# Patient Record
Sex: Female | Born: 1940 | Race: White | Hispanic: No | Smoking: Never smoker
Health system: Southern US, Community
[De-identification: ages and names within clinical notes are randomized; demographics above are authoritative.]

## PROBLEM LIST (undated history)

## (undated) DIAGNOSIS — M797 Fibromyalgia: Secondary | ICD-10-CM

## (undated) DIAGNOSIS — K219 Gastro-esophageal reflux disease without esophagitis: Secondary | ICD-10-CM

## (undated) DIAGNOSIS — F32A Depression, unspecified: Secondary | ICD-10-CM

## (undated) DIAGNOSIS — I10 Essential (primary) hypertension: Secondary | ICD-10-CM

## (undated) DIAGNOSIS — E7800 Pure hypercholesterolemia, unspecified: Secondary | ICD-10-CM

## (undated) DIAGNOSIS — Z9889 Other specified postprocedural states: Secondary | ICD-10-CM

## (undated) DIAGNOSIS — K449 Diaphragmatic hernia without obstruction or gangrene: Secondary | ICD-10-CM

## (undated) DIAGNOSIS — F329 Major depressive disorder, single episode, unspecified: Secondary | ICD-10-CM

## (undated) DIAGNOSIS — K52832 Lymphocytic colitis: Secondary | ICD-10-CM

## (undated) DIAGNOSIS — D649 Anemia, unspecified: Secondary | ICD-10-CM

## (undated) DIAGNOSIS — K52831 Collagenous colitis: Secondary | ICD-10-CM

## (undated) DIAGNOSIS — K589 Irritable bowel syndrome without diarrhea: Secondary | ICD-10-CM

## (undated) HISTORY — DX: Diaphragmatic hernia without obstruction or gangrene: K44.9

## (undated) HISTORY — DX: Essential (primary) hypertension: I10

## (undated) HISTORY — PX: KNEE SURGERY: SHX244

## (undated) HISTORY — DX: Fibromyalgia: M79.7

## (undated) HISTORY — PX: NOSE SURGERY: SHX723

## (undated) HISTORY — DX: Major depressive disorder, single episode, unspecified: F32.9

## (undated) HISTORY — DX: Collagenous colitis: K52.831

## (undated) HISTORY — DX: Other specified postprocedural states: Z98.890

## (undated) HISTORY — DX: Pure hypercholesterolemia, unspecified: E78.00

## (undated) HISTORY — PX: OTHER SURGICAL HISTORY: SHX169

## (undated) HISTORY — DX: Gastro-esophageal reflux disease without esophagitis: K21.9

## (undated) HISTORY — DX: Irritable bowel syndrome, unspecified: K58.9

## (undated) HISTORY — PX: FOOT SURGERY: SHX648

## (undated) HISTORY — DX: Lymphocytic colitis: K52.832

## (undated) HISTORY — PX: ABDOMINAL HYSTERECTOMY: SHX81

## (undated) HISTORY — DX: Anemia, unspecified: D64.9

## (undated) HISTORY — DX: Depression, unspecified: F32.A

---

## 2005-10-17 ENCOUNTER — Encounter: Payer: Self-pay | Admitting: Family Medicine

## 2005-11-04 ENCOUNTER — Encounter: Payer: Self-pay | Admitting: Family Medicine

## 2005-12-04 ENCOUNTER — Encounter: Payer: Self-pay | Admitting: Family Medicine

## 2006-05-27 ENCOUNTER — Inpatient Hospital Stay (HOSPITAL_COMMUNITY): Admission: EM | Admit: 2006-05-27 | Discharge: 2006-06-04 | Payer: Self-pay | Admitting: Emergency Medicine

## 2006-05-27 ENCOUNTER — Ambulatory Visit: Payer: Self-pay | Admitting: Internal Medicine

## 2006-05-29 ENCOUNTER — Encounter (INDEPENDENT_AMBULATORY_CARE_PROVIDER_SITE_OTHER): Payer: Self-pay | Admitting: Specialist

## 2006-05-29 HISTORY — PX: FLEXIBLE SIGMOIDOSCOPY: SHX1649

## 2006-06-26 ENCOUNTER — Ambulatory Visit: Payer: Self-pay | Admitting: Internal Medicine

## 2006-08-06 DIAGNOSIS — K449 Diaphragmatic hernia without obstruction or gangrene: Secondary | ICD-10-CM

## 2006-08-06 HISTORY — DX: Diaphragmatic hernia without obstruction or gangrene: K44.9

## 2006-12-04 ENCOUNTER — Ambulatory Visit: Payer: Self-pay | Admitting: Internal Medicine

## 2007-03-07 ENCOUNTER — Ambulatory Visit: Payer: Self-pay | Admitting: Internal Medicine

## 2007-04-07 DIAGNOSIS — Z9889 Other specified postprocedural states: Secondary | ICD-10-CM

## 2007-04-07 HISTORY — DX: Other specified postprocedural states: Z98.890

## 2007-04-09 ENCOUNTER — Ambulatory Visit: Payer: Self-pay | Admitting: Internal Medicine

## 2007-04-22 ENCOUNTER — Ambulatory Visit: Payer: Self-pay | Admitting: Internal Medicine

## 2007-04-22 ENCOUNTER — Ambulatory Visit (HOSPITAL_COMMUNITY): Admission: RE | Admit: 2007-04-22 | Discharge: 2007-04-22 | Payer: Self-pay | Admitting: Internal Medicine

## 2007-04-22 HISTORY — PX: ESOPHAGOGASTRODUODENOSCOPY: SHX1529

## 2008-03-04 ENCOUNTER — Ambulatory Visit: Payer: Self-pay | Admitting: Internal Medicine

## 2008-03-29 ENCOUNTER — Ambulatory Visit: Payer: Self-pay | Admitting: Gastroenterology

## 2008-04-13 ENCOUNTER — Encounter: Payer: Self-pay | Admitting: Internal Medicine

## 2008-04-13 ENCOUNTER — Ambulatory Visit: Payer: Self-pay | Admitting: Internal Medicine

## 2008-04-13 ENCOUNTER — Ambulatory Visit (HOSPITAL_COMMUNITY): Admission: RE | Admit: 2008-04-13 | Discharge: 2008-04-13 | Payer: Self-pay | Admitting: Internal Medicine

## 2008-04-13 HISTORY — PX: COLONOSCOPY: SHX174

## 2008-05-20 ENCOUNTER — Ambulatory Visit: Payer: Self-pay | Admitting: Internal Medicine

## 2008-08-05 DIAGNOSIS — R159 Full incontinence of feces: Secondary | ICD-10-CM | POA: Insufficient documentation

## 2008-08-05 DIAGNOSIS — Z9189 Other specified personal risk factors, not elsewhere classified: Secondary | ICD-10-CM | POA: Insufficient documentation

## 2008-08-05 DIAGNOSIS — F32A Depression, unspecified: Secondary | ICD-10-CM | POA: Insufficient documentation

## 2008-08-05 DIAGNOSIS — D649 Anemia, unspecified: Secondary | ICD-10-CM | POA: Insufficient documentation

## 2008-08-05 DIAGNOSIS — M359 Systemic involvement of connective tissue, unspecified: Secondary | ICD-10-CM | POA: Insufficient documentation

## 2008-08-05 DIAGNOSIS — R109 Unspecified abdominal pain: Secondary | ICD-10-CM | POA: Insufficient documentation

## 2008-08-05 DIAGNOSIS — K589 Irritable bowel syndrome without diarrhea: Secondary | ICD-10-CM | POA: Insufficient documentation

## 2008-08-05 DIAGNOSIS — R197 Diarrhea, unspecified: Secondary | ICD-10-CM | POA: Insufficient documentation

## 2008-08-05 DIAGNOSIS — R143 Flatulence: Secondary | ICD-10-CM

## 2008-08-05 DIAGNOSIS — IMO0001 Reserved for inherently not codable concepts without codable children: Secondary | ICD-10-CM | POA: Insufficient documentation

## 2008-08-05 DIAGNOSIS — K219 Gastro-esophageal reflux disease without esophagitis: Secondary | ICD-10-CM | POA: Insufficient documentation

## 2008-08-05 DIAGNOSIS — R110 Nausea: Secondary | ICD-10-CM | POA: Insufficient documentation

## 2008-08-05 DIAGNOSIS — Z8719 Personal history of other diseases of the digestive system: Secondary | ICD-10-CM | POA: Insufficient documentation

## 2008-08-05 DIAGNOSIS — I10 Essential (primary) hypertension: Secondary | ICD-10-CM | POA: Insufficient documentation

## 2008-08-05 DIAGNOSIS — R142 Eructation: Secondary | ICD-10-CM

## 2008-08-05 DIAGNOSIS — M129 Arthropathy, unspecified: Secondary | ICD-10-CM | POA: Insufficient documentation

## 2008-08-05 DIAGNOSIS — R141 Gas pain: Secondary | ICD-10-CM | POA: Insufficient documentation

## 2008-08-05 DIAGNOSIS — R1310 Dysphagia, unspecified: Secondary | ICD-10-CM | POA: Insufficient documentation

## 2008-08-05 DIAGNOSIS — F329 Major depressive disorder, single episode, unspecified: Secondary | ICD-10-CM | POA: Insufficient documentation

## 2008-08-05 DIAGNOSIS — F418 Other specified anxiety disorders: Secondary | ICD-10-CM | POA: Insufficient documentation

## 2010-12-19 NOTE — Assessment & Plan Note (Signed)
NAMEMarland Kitchen  Marie, [name]                  CHART#:  [id number]   DATE:  03/04/2008                       DOB:  01-Nov-1940   CHIEF COMPLAINT:  Diarrhea for 3 weeks.   SUBJECTIVE:  The patient is a 70 year old lady who was brought in for  urgent office visit.  We received a phone call from the patient's  neighbor, Marie [name], stating that the patient had been sick with diarrhea for  almost 3 weeks, and that she was getting weak, having upwards to 11  bowel movements daily.  She said Imodium was not helping nor was  Questran.  We advised that the patient need to be seen today.  The  patient states that she was doing very well, having about 2 bowel  movements a day of solid stool.  However, 3 weeks ago, after eating out  at a restaurant she developed acute onset of diarrhea.  She states she  has more than 10 bowel movements a day consistently.  She often is  awoken from her sleep to have a bowel movement.  She has had multiple  episodes of incontinence.  Her stool is described as watery and  nonbloody.  She had 11 stools yesterday.  She has lost 6 pounds.  She  has nausea, but no vomiting.  She states she has had temperature of 99,  but that is the highest.  Her last use of antibiotics was in May 2009,  for UTI.  She states that she is anemic and has had some blood work  recently with her family physician at [facility].  She  complains of being fatigued and tired.  She feels weak.  She is able to  eat and drink plenty of fluids.  She denies any medication changes prior  to beginning of this diarrhea.   CURRENT MEDICATIONS:  1. Multivitamin daily.  2. Calcium daily.  3. Nexium 40 mg daily.  4. HCTZ 12.5 mg daily.  5. Cozaar 50 mg daily.  6. Tramadol 50 mg p.r.n.  7. Oxybutynin 10 mg daily.  8. Cymbalta 30 mg daily.  9. Questran powder as needed.  10.Metamucil at bedtime.  11.Vitamin D daily.  12.Vitamin B complex daily.   ALLERGIES:  No known drug allergies.   PHYSICAL  EXAMINATION:  VITAL SIGNS:  Weight 163 pounds, which is down  from 176 pounds back in September 2008.  Temperature 98.2, blood  pressure 120/78, and pulse 72.  GENERAL:  A pleasant, well-nourished, well-developed, Caucasian female  in no acute distress.  SKIN:  Warm and dry.  No jaundice.  HEENT:  Sclerae nonicteric.  Oropharyngeal mucosa moist and pink.  CHEST:  Lungs are clear to auscultation.  CARDIAC:  Regular rate and rhythm. No murmurs, rubs, or gallops.  ABDOMEN:  Positive bowel sounds.  Abdomen is soft and nondistended.  She  has mild left lower quadrant and lower abdominal tenderness to deep  palpation.  No rebound or guarding.  No organomegaly or masses.  No  abdominal bruits or hernias.  LOWER EXTREMITIES:  No edema.   IMPRESSION:  The patient is a very pleasant 70 year old lady with 3-week  history of diarrhea.  She has a history of lymphocytic colitis  previously responded nicely to Entocort.  She had been in remission at  the time of her last  office visit in September 2008.  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  We would like to rule  out infectious etiology, given the onset of these symptoms.  Less likely  we are dealing with diverticulitis, given that her pronounced symptom is  diarrhea rather than a left lower quadrant pain.  She tells me that she  has anemia, and she was being checked for iron deficiency.  We will  retrieve these labs to see what to what degree she is anemic.   PLAN:  1. Retrieve labs from [facility].  2. Send stool for O and P culture, C. diff, and WBC.  3. We will begin Entocort 9 mg daily, #90, 0 refills.  4. Lomotil 1 p.o. b.i.d., #30, 0 refills.  5. If she develops fever of 101 or greater, she will let someone know      or go to the emergency department.  She will go to the emergency      department if she has worsening abdominal pain or if she is not      able to consume plenty of POs.  Further  recommendations to follow.       Tana Coast, P.A.  Electronically Signed     R. Roetta Sessions, M.D.  Electronically Signed    LL/MEDQ  D:  03/04/2008  T:  03/05/2008  Job:  [id number]   cc:   [facility]

## 2010-12-19 NOTE — Assessment & Plan Note (Signed)
[name], [name]                    CHART#:  [id number]   DATE:  03/07/2007                       DOB:  06/16/1941   CHIEF COMPLAINT:  Diarrhea/history of microscopic colitis.   SUBJECTIVE:  The patient is a 70 -year-old Caucasian female who has a  history of microscopic colitis. She had previously been on Entocort and  was tapered off of this approximately two months ago. She tells that she  became ill five days ago. She was having 7-8 loose stools a day and this  lasted for about three days. She tells me her mother and sister were  also sick. However, her symptoms have continued to persist. She has a  history of fibromyalgia. She is complaining of some weakness. She  complains of nausea, bloating and abdominal cramping. The diarrhea has  begun to slack off. She tells me she had two bowel movements today.  She denies any fever or chills. She does continue to take Metamucil  twice daily. She has a history of chronic GERD. She is on Nexium and has  been doing well with this. Denies any breakthrough symptoms. She has  noticed intermittent choking with her food, mostly solid foods over the  last three weeks as well. She tells me she had an EGD previously in  [place] which showed a hiatal hernia within the last couple of years,  but we do not have this report.   CURRENT MEDICATIONS:  See the list from March 07, 2007.   ALLERGIES:  No known drug allergies.   OBJECTIVE:  VITAL SIGNS: Weight 174 pounds, height 62.5 inches,  temperature 97.7, blood pressure 138/80, pulse 86.  GENERAL: The patient is well-developed, well-nourished elderly female in  no acute distress.  HEENT: Sclerae are  clear and anicteric. Conjunctivae pink. Oropharynx  pink and moist without any lesions.  CHEST: HEART: Regular rate and rhythm. Normal S1, S2.  ABDOMEN: Positive bowel sounds x4. No bruits auscultated. Soft and  nontender, nondistended without palpable mass or hepatosplenomegaly. No  rebound, tenderness  or guarding.  EXTREMITIES: Without clubbing or edema bilaterally.   ASSESSMENT:  The patient is a 70 year old female with acute onset of  diarrhea with a past history of microscopic colitis which has been doing  well. With both her mother and sister sick, it is possible that she may  have had a viral enteritis. It is reassuring that she is improving each  day. She can try Imodium which is first line therapy if she is having  recurrent problems with her microscopic colitis as well. However, at  this point, she says the diarrhea has improved greatly and we will just  continue to monitor.   As far as her dysphagia is concerned, I have offered barium pill  esophagram to further assess her symptoms. However, she is wanting to  hold off at this point and see how she does.   She has a history of anemia. Last hemoglobin was 11.1 on December 04, 2006.   PLAN:  1. Offered barium pill esophagram and she declines. She will call if      she changes her mind.  2. CBC, MET-7 today.  3. Imodium 2 mg in the morning if she has persistent diarrhea.  4. Continue Nexium 40 mg daily.       Lorenza Burton,  N.P.  Electronically Signed     R. Roetta Sessions, M.D.  Electronically Signed    KJ/MEDQ  D:  03/07/2007  T:  03/07/2007  Job:  [id number]   cc:   [facility]

## 2010-12-19 NOTE — Assessment & Plan Note (Signed)
NAMEMANIKA, HAST                    CHART#:  [id number]   DATE:  04/09/2007                       DOB:  02-15-1941   HISTORY OF PRESENT ILLNESS:  Follow up microscopic colitis,  gastroesophageal reflux disease now with esophageal dysphagia.  The  patient was evaluated end of last year with hospitalized to [facility].  She had been having diarrhea and had been up to [facility]  being evaluated by Dr. Edmonia Caprio.  Ultimately she underwent a sigmoidoscopy  by Dr. Dionicia Abler on 05/29/06 and was found to have colitis on sigmoid  biopsy.  She was started on Anticort and this was associated with a  marked and dramatic improvement in her diarrhea symptoms.  She has  ultimately come off the Anticort and has been on low dose Imodium and  was tried on some Questran recently which she really did not like taking  but it did curb diarrhea further.  This was a month ago for a few days.  Currently she is having 1-2 bowel movements daily.  Occasionally has  diarrhea but her good days are far outnumbering her bad days taking 1-2  Imodium daily.  Her big concern now is reflux symptoms and decreasing  benefit of her Nexium which she tells me now has a $50.00 copay.  She  also describes intermittent esophageal dysphagia to solids.  She had an  ENT evaluation a few years ago that told her cough was due to reflux but  she describes not having an EGD recently, she did have one many years  ago and was told she had a hiatal hernia but she describes intermittent  esophageal dysphagia now to solids including meat and bread.   CURRENT MEDICATIONS:  See updated list.   ALLERGIES:  No known drug allergies.   PHYSICAL EXAMINATION:  GENERAL:  Looks well today.  VITAL SIGNS:  Weight 176 which is up 2 pounds since 03/07/07, height 5  foot 2.5 inches, temperature 98.4, BP 118/82, pulse 64.  SKIN:  Warm and dry.  CHEST:  Lungs are clear to auscultation.  CARDIAC:  Regular rate and rhythm without murmurs, gallops or  rubs.  ABDOMEN:  Nondistended, positive bowel sounds, soft, nontender without  appreciable mass or organomegaly.  EXTREMITIES:  No edema.   ASSESSMENT:  Microscopic colitis more or less in remission now.  She is  doing really well on p.r.n. Imodium.  I told her she could certainly  continue on the Imodium as long as her good days were outnumbering her  bad days and Imodium was effective.  If she has worsening symptoms she  is to let me know and will considering reinstituting oral budesonide  therapy and/or adding a mesalamine preparation.   She has long-standing GERD and now has esophageal dysphagia; this needs  further evaluation.  We talked about getting a barium pill esophagram  versus and EGD and after discussing the long term nature of her reflux  symptoms we mutually felt it would be best to proceed with an EGD.  If  she does have a ring and/or stricture she can have it dilated at the  same time.  Potential risks, benefits, and alternatives have been  reviewed.  She is agreeable with plan to perform EGD with esophageal  dilation in the very near future at [facility] with further  recommendations to follow.       [name], M.D.  Electronically Signed     RMR/MEDQ  D:  04/09/2007  T:  04/09/2007  Job:  [id number]   cc:   Bethena Midget, M.D.

## 2010-12-19 NOTE — Op Note (Signed)
NAMEKAJUANA, SHAREEF                 ACCOUNT NO.:  [id number]   MEDICAL RECORD NO.:  [id number]          PATIENT TYPE:  AMB   LOCATION:  DAY                           FACILITY:  [facility]   PHYSICIAN:  R. Roetta Sessions, M.D. DATE OF BIRTH:  [birth year]   DATE OF PROCEDURE:  04/22/2007  DATE OF DISCHARGE:                               OPERATIVE REPORT   PROCEDURE:  1. Esophagogastroduodenoscopy.  2. Maloney dilation.   INDICATIONS FOR PROCEDURE:  This 70 year old lady with long-standing  GERD symptoms and esophageal dysphagia, EGD is now being done with the  potential for esophageal dilation.  The potential risks, benefits, and  alternatives have been reviewed, questions were answered and she is  agreeable.  Please see the documentation in the medical record.   PROCEDURE NOTE:  O2 saturation, blood pressure, pulse, and respirations  were monitored throughout the entire procedure.  Conscious sedation with  Versed 4 mg IV, and Demerol 75 mg IV in divided doses, Cetacaine spray  for topical pharyngeal anesthesia.  Instrument, Pentax video chip  system.   FINDINGS:  Examination to esophagus revealed no mucosal abnormalities.  The esophagus was widely patent down to the EG junction.  The EG  junction was easily traversed and the stomach and colon gastric cavity  was empty.  It insufflated well with air, and examination of the gastric  mucosa from retroflexion of the proximal stomach and esophagogastric  junction demonstrated only a small hiatal hernia.  The pylorus was  patent and easily traversed.  Examination of the bulb and second portion  revealed no abnormalities.  Therapeutic/diagnostic dilation was  performed.  The scope was withdrawn and a 56-French Maloney dilator was  passed and fully inserted with ease.  The balloon bag revealed no  apparent complication related to the passage of the dilator.  The  patient tolerated the procedure well and was reacting in the endoscopy  room.   IMPRESSION:  1. Normal esophagus.  2. A small hiatal hernia.  3. Otherwise normal stomach D1 and D2 status post passage of a 56-      [place] Maloney dilator.   RECOMMENDATIONS:  Continue Nexium 40 mg daily.  She should have, I would  anticipate no difficulty swallowing.  If she were to have continued  dysphagia symptoms then further evaluation starting with a barium flow  esophagram would be indicated.      [name], M.D.  Electronically Signed     RMR/MEDQ  D:  04/22/2007  T:  04/22/2007  Job:  [id number]   cc:   Bethena Midget, M.D.  [facility]  [place], [place]

## 2010-12-19 NOTE — Assessment & Plan Note (Signed)
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  Colonoscopy April 13, 2008, demonstrated left-sided diverticula.  Biopsies demonstrated collagenous colitis, hepatic flexure descending in  rectum.  She had a previously diagnosed lymphocytic colitis, previously  poor response to Entocort.  We want to go back to Entocort, add 2.4 g  Lialda daily to her regimen.  She could not afford the Entocort, started  on Lialda and within 2 weeks, she was __________ constipated and stopped  Lialda, now she is back to baseline with what she describes as normal  bowel function taking a dose of Metamucil daily.  She had been on  pantoprazole after the utilization of Nexium, put her in the doughnut  hole.  Pantoprazole has not been very effective in managing her reflux  symptoms.  She want some Nexium samples today.  Overall, bowel function  is much better.   CURRENT MEDICATIONS:  See updated list.   ALLERGIES:  No known drug allergies.   PHYSICAL EXAMINATION:  GENERAL:  Looks well.  VITAL SIGNS:  Weight 165, height 5 feet and 2 inches, temperature 98.2,  BP 120/80, pulse 72.  CHEST:  Lungs are clear to auscultation.  CARDIAC:  Regular rate and rhythm without murmur, gallop, or rub.  ABDOMEN:  Nondistended, positive bowel sounds, soft, nontender without  appreciable mass or organomegaly.   ASSESSMENT:  1. Gastroesophageal reflux disease symptoms, poorly controlled      pantoprazole.  We are going to give some Nexium samples x30, 40 mg      once daily.  2. History of collagenous colitis, now quiescent.  She may or may not      have a recurrence.  I have asked her to continue taking the      Metamucil daily.  Should she have recurrence of her diarrhea, we      will try the novel dose of a single 1.2 g Lialda capsule daily and      see how we      do.  Otherwise,  we will plan to see this nice lady back in the      office in 12 months' course.  If she has any interim problems, she      is to let me know.       [name], M.D.  Electronically Signed     RMR/MEDQ  D:  05/20/2008  T:  05/21/2008  Job:  [id number]   cc:   [facility]
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
## 2010-12-19 NOTE — Op Note (Signed)
NAMECHALON, [name]                 ACCOUNT NO.:  [id number]   MEDICAL RECORD NO.:  [id number]          PATIENT TYPE:  AMB   LOCATION:  DAY                           FACILITY:  [facility]   PHYSICIAN:  R. Roetta Sessions, M.D. DATE OF BIRTH:  10/20/1940   DATE OF PROCEDURE:  04/13/2008  DATE OF DISCHARGE:                               OPERATIVE REPORT   PROCEDURE PERFORMED:  Ileocolonoscopy, segmental biopsy, and stool  sampling.   INDICATIONS FOR PROCEDURE:  A 70 year old lady with a good 12 weeks  worth of nonbloody diarrhea.  She has a history of lymphocytic colitis,  started on Entocort nearly 2 months ago and went on until last week.  She started to appreciate some improvement in her bowel symptoms.  Her  stool studies were negative except for lacto-b which was positive.  Last  colonoscopy was by Dr. Samuella Cota a few years back in [place].  She comes  now for ileocolonoscopy to further evaluate her apparent refractory  diarrhea.  Risks, benefits, alternatives, and limitations have been  reviewed previously and again today at the bedside.  She had a negative  TTG antibody with a normal serum IgA recently in 2007.  Please see the  documentation in the medical record.   PROCEDURE NOTE:  O2 saturation, blood pressure, pulse rate, and  respirations were monitored throughout the entire procedure.   CONSCIOUS SEDATION:  Versed 4 mg IV, Demerol 75 mg IV in divided doses.   INSTRUMENT:  Pentax video chip system.   FINDINGS:  Digital rectal exam revealed no abnormalities.  Endoscopic  findings:  The prep was good.  Colon:  The colonic mucosa was surveyed  from the rectosigmoid junction through the left transverse, right colon,  appendiceal orifice, ileocecal valve, and cecum.  These structures were  well seen and photographed for the record.  Terminal ileum was intubated  to 15 cm.  From this level, the scope was slowly and cautiously  withdrawn.  All previously mentioned mucosal surfaces were  again seen.  The patient was noted to have left-sided diverticula.  The remainder of  the colonic mucosa appeared entirely normal as did the terminal ileum  mucosa biopsies, segmental biopsies were taken at the hepatic flexure,  descending segment, and rectum.  The remainder of the colonic mucosa  appeared normal.  The scope was pulled down to the rectum with thorough  examination of rectal mucosa, including retroflexed view of the anal  verge, demonstrated no abnormalities aside from anal papilla and  internal hemorrhage (rectal mucosal biopsy as stated above).  The  patient tolerated the procedure well and was reacted in Endoscopy.   IMPRESSION:  1. Anal papilla and internal hemorrhage, otherwise normal rectum,      status post biopsy.  2. Left-sided diverticula, colonic mucosa, terminal ileum mucosa      appeared normal, status post segmental biopsies.  Also, a stool      specimen was suctioned out and sent to microbiology lab (not      mentioned above).   RECOMMENDATIONS:  1. Diverticulosis.  Literature was provided to [name].  2. Followup of path, stool sample.  3. Further recommendations to follow.      [name], M.D.  Electronically Signed     RMR/MEDQ  D:  04/13/2008  T:  04/14/2008  Job:  [id number]   cc:   [facility]  [place], [place]

## 2010-12-22 NOTE — Discharge Summary (Signed)
Marie [name], Marie [name]                 ACCOUNT NO.:  [id number]   MEDICAL RECORD NO.:  [id number]          PATIENT TYPE:  INP   LOCATION:  A337                          FACILITY:  [facility]   PHYSICIAN:  Osvaldo Shipper, MD     DATE OF BIRTH:  February 22, 1941   DATE OF ADMISSION:  05/27/2006  DATE OF DISCHARGE:  10/30/2007LH                                 DISCHARGE SUMMARY   Please review H&P dictated by Dr. Mikeal Hawthorne for details regarding the patient's  presenting illness.   DISCHARGE DIAGNOSES:  1. Lymphocytic colitis requiring steroid treatment.  2. Acute on chronic diarrhea.  3. Hypertension.  4. History of diverticulitis.  5. History of fibromyalgia.  6. History of irritable bowel syndrome.  7. Iron-deficiency anemia.   BRIEF HOSPITAL COURSE:  Briefly, this is a 70 year old Caucasian female who  presented to the hospital with abdominal pain, nausea and vomiting and  diarrhea.  Apparently her symptoms have been ongoing since May of this year.  She has had colonoscopy in June of this year done at [facility], which  apparently showed some kind of colitis and she was started on Entocort;  however, her symptoms did not improve and she continued to have more than 10  watery bowel movements every day.  The patient was found to have significant  diarrhea when she presented to the hospital.  She was admitted for further  evaluation.  She did have an elevated white count on presentation.  Her LFTs  showed only  mild abnormalities.  Otherwise, the rest of her blood work was  normal.  The patient was seen by the gastroenterology service, who  recommended stool studies, which were done.  C. difficile came back  negative.  Cultures did not grow any concerning organisms.  No ova or  parasites were noted.  Her occult blood testing was also negative x3.  A  celiac panel was sent off, which was also negative.  The patient underwent a  flexible sigmoidoscopy done by Dr. Karilyn Cota, which did not suggest any  colitis.  The patient was already on Cipro and Flagyl at that time.  Biopsies were sent, and biopsies came back today, actually yesterday, which  showed evidence for mild chronic colitis with features of lymphocytic  colitis.  Based on this finding, GI recommended reinitiating her Entocort.  The patient was also started on Imodium and Fibercon, with which her  diarrhea also improved.  This morning the patient was feeling quite well and  the gastroenterology service decided to discharge her and we gave her okay  for this.  I personally did not see this patient this morning.   We did find iron-deficiency anemia in this individual, and we started her on  Nu-Iron.  Her other medical problems, including hypertension, fibromyalgia,  depression, all remained stable.  She was able to tolerate p.o. intake quite  well as well.   On the day of discharge, on review of her vital signs, everything was  stable.  She had consumed 75% of her meals yesterday.  Her diarrhea had also  improved significantly and she had  about two BMs only this morning.   DISCHARGE MEDICATIONS:  1. Entocort 9 mg daily for 6 weeks per GI.  2. Fibercon 625 mg b.i.d.  3. Celexa 20 mg daily.  4. Nu-Iron 150 mg daily.  5. Bentyl 20 mg q.a.c. and h.s.  6. Imodium 2 mg every 6 hours as needed for diarrhea.   She was asked to resume her other outpatient medications as before, which  include:  1. Tramadol as needed.  2. Cozaar as needed.  3. Zofran as needed.  4. Nexium.  5. Premarin.  6. Hydrochlorothiazide.   FOLLOW-UP:  With Dr. Cira Servant in 2-3 weeks, with PMD as needed.   DIET:  She needs to have a high-fiber, lactose-free, low-fat diet.   No restrictions placed on physical activity.   PROCEDURES DONE DURING THIS ADMISSION:  Flexible sigmoidoscopy, please see  dictated report and my note above.   CONSULTATIONS:  Gastroenterology service, Lionel December, M.D., and Kassie Mends, M.D., saw this patient.   IMAGING  STUDIES:  A CT scan of the abdomen and pelvis, which showed question  of a gallstone in the region of the neck of the gallbladder, otherwise no  other findings apart from mild sigmoid diverticulosis.  Her ultrasound of  the abdomen was unremarkable, and there was no visualization of the  gallstone seen on CT.  Since her LFTs were unremarkable and she was not  symptomatic in that area, this was not thought to be a significant finding.      Osvaldo Shipper, MD  Electronically Signed     GK/MEDQ  D:  06/04/2006  T:  06/04/2006  Job:  [id number]   cc:   Kassie Mends, M.D.  [address]  [place] , [place] [address]

## 2010-12-22 NOTE — Group Therapy Note (Signed)
NAMEBRITTINIE, Marie [name]                 ACCOUNT NO.:  [id number]   MEDICAL RECORD NO.:  [id number]          PATIENT TYPE:  INP   LOCATION:  A337                          FACILITY:  [facility]   PHYSICIAN:  Margaretmary Dys, M.D.DATE OF BIRTH:  [birth year]   DATE OF PROCEDURE:  06/02/2006  DATE OF DISCHARGE:                                   PROGRESS NOTE   SUBJECTIVE:  Patient still continues to have some abdominal cramping and two  loose bowel motions already this morning.  She was given some Imodium by Dr.  Cira Servant yesterday, but she said it has yet to help her.  She denies any fever  or chills and still feels very weak.  We are still awaiting the pathology  report of her biopsy.   OBJECTIVE:  GENERAL:  Conscious, alert, comfortable, not in acute distress.  VITAL SIGNS:  Stable.  HEENT:  Normocephalic, atraumatic.  Oral mucosa was moist with no exudates.  NECK:  Supple, no JVD, no lymphadenopathy.  LUNGS:  Clear clinically with good air entry bilaterally.  HEART:  S1, S2 regular.  No S3, S4, gallops or rubs.  ABDOMEN:  Soft, nontender, bowel sounds positive.  No masses palpable.  EXTREMITIES:  No edema.   LABORATORY/DIAGNOSTIC DATA:  BMET was normal.  Calcium ws 8.3.   ASSESSMENT/PLAN:  Acute on chronic diarrhea of unclear etiology.  Patient  had a flexible sigmoidoscopy with biopsy report pending.  Celiac sprue  serology was unremarkable.  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  Her diarrhea is being managed with  Imodium, and patient continues followup with Dr. Cira Servant.   DISPOSITION:  Will await pathology report and plan for her discharge soon  afterwards.  Hopefully, we will be able to perhaps put her on some  medication to help decrease the diarrhea, possibly some steroid or treatment  for IBD.      Margaretmary Dys, M.D.  Electronically Signed     AM/MEDQ  D:  06/02/2006  T:  06/02/2006  Job:  [id number]

## 2010-12-22 NOTE — Group Therapy Note (Signed)
NAMEMCKAY, BRANDT                 ACCOUNT NO.:  [id number]   MEDICAL RECORD NO.:  [id number]          PATIENT TYPE:  INP   LOCATION:  A337                          FACILITY:  [facility]   PHYSICIAN:  Margaretmary Dys, M.D.DATE OF BIRTH:  10-15-1940   DATE OF PROCEDURE:  06/01/2006  DATE OF DISCHARGE:                                   PROGRESS NOTE   SUBJECTIVE:  The patient doing better.  She still reports some abdominal  cramping, and she has had two loose bowel movements this morning.  She  denies any blood or dark stools.  Biopsy report from her flexible  sigmoidoscopy performed on May 29, 2006, is still pending.  She denies  any fevers or chills, no nausea or vomiting.   OBJECTIVE:  GENERAL:  Conscious, alert, comfortable, pleasant, not in acute  distress.  VITAL SIGNS:  Blood pressure is 146/69, pulse of 66, respirations of 18,  temperature of 98.4 degrees Fahrenheit.  HEENT:  Normocephalic, atraumatic.  Oral mucosa was moist with no exudates.  NECK:  Supple, no JVD, no lymphadenopathy.  LUNGS:  Clear clinically with good air entry bilaterally.  HEART:  S1 S2 regular; no S3, gallops, or rubs.  ABDOMEN:  Soft.  There was mild tenderness in the suprapubic area.  Otherwise, it was unremarkable.  Bowel sounds were slightly hyperactive.  EXTREMITIES:  No pitting pedal edema.   LABORATORY/DIAGNOSTIC DATA:  Her CBC and BMET were normal.  It is noted that  anemia is stable.   ASSESSMENT AND PLAN:  Chronic diarrhea of unclear etiology.  The patient  currently undergoing evaluation.  She had a recent flexible sigmoidoscopy;  the biopsy report is still pending.  The patient's diet has been adjusted by  GI.  It is suspected that the patient may have celiac sprue.  The serology  for that is still pending.  If patient's flexible sigmoidoscopy biopsy  report is negative the  plan is to maybe proceed with a small bowel biopsy.   She is doing fairly well and hemodynamically stable at this  time.  Continue  all her other medications.  I will transfer her to 3-A.      Margaretmary Dys, M.D.  Electronically Signed     AM/MEDQ  D:  06/01/2006  T:  06/01/2006  Job:  [id number]

## 2010-12-22 NOTE — H&P (Signed)
NAMESULEIKA, DONAVAN                 ACCOUNT NO.:  [id number]   MEDICAL RECORD NO.:  [id number]          PATIENT TYPE:  INP   LOCATION:  A215                          FACILITY:  [facility]   PHYSICIAN:  Lonia Blood, M.D.      DATE OF BIRTH:  13-Mar-1941   DATE OF ADMISSION:  05/27/2006  DATE OF DISCHARGE:  LH                                HISTORY & PHYSICAL   PRIMARY CARE PHYSICIAN:  The patient is unassigned.  She is from [facility].   PRESENTING COMPLAINT:  Abdominal pain, nausea and vomiting, as well as  diarrhea.   HISTORY OF PRESENT ILLNESS:  The patient is a 70 year old female who lives  in [place] and is here secondary to her primary care physician and  her other physician in [place] telling her that they cannot do  anything more for her.  She apparently has been having abdominal problems  for the past couple of months.  She had a colonoscopy done in June of this  year and she was informed that she had some form of colitis and this came  from a biopsy.  She has been doing okay until recently, when she was  admitted into the hospital with abdominal pain, nausea, vomiting and  diarrhea.  She did better and was discharged only yesterday morning;  however, her problem came back and her primary care physician said they  could not do anything for her; hence, she came over to our hospital.  She is  feeling better now.  Her abdominal pain is more crampy and usually when she  goes to the bathroom.  She had some fever at some point, but not currently.  Her diarrhea has been more persistent and watery.  She has had history of  irritable bowel syndrome, but denied that this has anything resembling that.   PAST MEDICAL HISTORY:  History of diverticulitis, arthritis, fibromyalgia,  hiatus hernia, hypertension, irritable bowel syndrome and the colitis.   ALLERGIES:  The patient is allergic to [facility].   MEDICATIONS:  1. Tramadol 50 mg every 3 hours daily.  2. Cozaar 50 mg  daily.  3. Zofran 4 mg q.4 h. p.r.n.  4. Citalopram 20 mg daily.  5. Nexium 40 mg daily.  6. Premarin 0.625 mg daily.  7. Hydrochlorothiazide 12.5 mg daily.  8. Entocort EC 9 mg daily.   SOCIAL HISTORY:  The patient lives in [place] with her son.  Denied  any tobacco or alcohol use.  No IV drug use.   FAMILY HISTORY:  Noncontributory and denied any GI malignancy, etc.   REVIEW OF SYSTEMS:  Twelve-point review of systems is mainly per HPI.   PHYSICAL EXAMINATION:  VITAL SIGNS:  On exam, the patient is afebrile with a  temperature of 97.1, blood pressure initially 107/68, currently 174/62,  pulse 74, respiratory rate 18.  Her SATs are 96% on room air.  GENERAL:  She is awake, alert and oriented, in no acute distress.  HEENT:  PERRL.  EOMI.  Slightly dry mucous membranes.  NECK:  Supple.  No visible  JVD.  No lymphadenopathy.  No crepitus.  No  thyromegaly.  RESPIRATORY:  Good air entry bilaterally.  No wheezes.  No rales.  CARDIOVASCULAR:  S1 and S2.  No murmurs.  ABDOMEN:  Slightly distended, firm and nontender.  Positive exaggerated  bowel sounds.  EXTREMITIES:  Her extremities show no edema, cyanosis or clubbing.   LABORATORY DATA:  Her labs showed a white count of 14.4, hemoglobin 12.7,  platelet count 333,000 with left shift, ANC 13.3.  Sodium 139, potassium  4.3, chloride 101, CO2 25, glucose 116, BUN 17, creatinine 1.0, calcium 9.9,  total protein 7.4, albumin 3.9, AST 47, ALT 35, alkaline phosphatase 83,  total bilirubin 0.9.  Urine wbc's 0-2, but urine rbc's 11-20.  Urine  specific gravity more than 1.030, moderate hemoglobin, small bilirubin,  ketones 15, total protein 100.  Her lipase is 32.   IMAGING STUDY:  CT of abdomen and pelvis shows questionable gallstones in  the region of the neck of the gallbladder, otherwise negative.  The appendix  was visualized and normal.  Mild diverticular disease in the sigmoid colon  without evidence of active diverticulitis and  no free fluid or free air.   ASSESSMENT:  1. This is a 70 year old female presenting with abdominal pain, nausea,      vomiting and diarrhea.  The differentials are varied, but per the      patient, she has some form of colitis.  It is not clear if this is      infectious colitis, like Clostridium difficile or ischemic colitis, or      any other colitis.  We will try and obtain records from [place],      [place].  In the meantime, we will admit the patient.  We will start      hydrating her and pain control and get gastroenterology consult.  The      patient may require another colonoscopy at this point with biopsy to      evaluate the source of this infection.  Otherwise, her other medical      problems are essentially normal.  We will keep her nothing-by-mouth      except for clear liquids.  Once her nausea and vomiting are controlled,      we will resume her oral medications.  2. At this point, however, she has other additional diagnoses like      hematuria.  In the process, we may have to do a renal ultrasound.  3. Gastroesophageal reflux disease:  We will continue her proton pump      inhibitor.  4. Arthritis:  Pain control.  5. Fibromyalgia:  Also, we will continue with some pain control and we      will await Gastroenterology input.      Lonia Blood, M.D.  Electronically Signed     LG/MEDQ  D:  05/28/2006  T:  05/28/2006  Job:  [id number]

## 2010-12-22 NOTE — Op Note (Signed)
NAMESYANNE, LOONEY                 ACCOUNT NO.:  [id number]   MEDICAL RECORD NO.:  [id number]          PATIENT TYPE:  INP   LOCATION:  A215                          FACILITY:  [facility]   PHYSICIAN:  Lionel December, M.D.    DATE OF BIRTH:  26-Aug-1940   DATE OF PROCEDURE:  05/29/2006  DATE OF DISCHARGE:                                 OPERATIVE REPORT   PROCEDURE:  Flexible sigmoidoscopy.   INDICATIONS:  Halston is a 70 year old Caucasian female with recurrent copious  diarrhea for which she has been hospitalized twice at [facility].  At one point she was treated with C. difficile colitis and  currently being treated for microscopic colitis having copious diarrhea.  Stool for C. difficile is negative.  Other stool studies are pending.  She  also has anemia, felt to be due to iron-deficiency based on low serum iron  saturation and low normal ferritin.  She is undergoing diagnostic flexible  sigmoidoscopy.   Procedure risks were reviewed with the patient and informed consent was  obtained.   MEDICATIONS FOR CONSCIOUS SEDATION:  Demerol 25 mg IV and Versed 4 mg IV.   FINDINGS:  Procedure performed in endoscopy suite.  The patient's vital  signs and O2 sats were monitored during procedure and remained stable.  The  patient was placed in left lateral recumbent position.  Rectal examination  performed.  No abnormality noted on external or digital exam.  Olympus  videoscope was placed in the rectum and advanced under vision into sigmoid  colon and beyond.  She had some liquid stool, however, mucosa was well seen  and appeared to be normal.  Scope was passed in the distal transverse colon.  As the scope was withdrawn, colonic mucosa was once again carefully examined  and mucosa was normal throughout.  Few scattered diverticula were noted in  descending colon with a few more in the sigmoid colon.  There were no  pseudomembranes or other abnormalities to suggest C. difficile  colitis.  Rectal mucosa was normal.  Scope was retroflexed to examine anorectal  junction which was unremarkable.  Endoscope was straightened and withdrawn.  The patient tolerated the procedure well.   FINAL DIAGNOSIS:  No evidence of pseudomembranous or acute colitis.   Random biopsies taken from mucosa of sigmoid colon.   RECOMMENDATIONS:  1. Full liquid diet.  2. Will leave her on Cipro and Flagyl until a culture and O&P results have      been completed.  If negative,      antibiotic therapy will be discontinued.  3. Imodium 2 mg p.o. t.i.d.   Whether or not she will need a duodenal biopsy will depend on results of  celiac antibody panel.      Lionel December, M.D.  Electronically Signed     NR/MEDQ  D:  05/29/2006  T:  05/30/2006  Job:  [id number]   cc:   Dr. Delila Pereyra, M.D.  [facility]

## 2010-12-22 NOTE — Consult Note (Signed)
[name], Marie [name]                 ACCOUNT NO.:  [id number]   MEDICAL RECORD NO.:  [id number]          PATIENT TYPE:  INP   LOCATION:  A215                          FACILITY:  [facility]   PHYSICIAN:  R. Roetta Sessions, M.D. DATE OF BIRTH:  1941-03-19   DATE OF CONSULTATION:  05/28/2006  DATE OF DISCHARGE:                                   CONSULTATION   REASON FOR CONSULTATION:  Diverticulitis.   PHYSICIAN REQUESTING CONSULTATION:  [facility].   HISTORY OF PRESENT ILLNESS:  Patient is a 70 year old Caucasian female with  a 5 month history of diarrhea.  She states her symptoms began back in May of  2007.  She was admitted to [facility] at that time.  She  states she underwent a colonoscopy by Dr. 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  She  describes not being able to take Flagyl because of the bad taste.  She  states she was given vancomycin eventually.  She went home from that  hospitalization only to return about a week later with recurrent diarrhea.  She states she has basically had on and off diarrhea ever since then.  She  has been up to Dr. Hendricks Milo and her primary care physician multiple times.  She has had multiple stool studies and x-rays along the way but does not  sound like she has ever had screening for celiac disease or any type of 24  hour stool studies.  She had another hospitalization at [facility] and just went home 2 days ago.  During this hospitalization she was  seen by the hospitalist and was told that based on her biopsies from back in  May she had microscopic colitis.  They started her on Entocort.  She went  home from the hospital 2 days ago, did well the first 24 hours, then she  developed nausea, vomiting and recurrent diarrhea.  She presented to [facility] at the request of her PCP.  She has had two episodes of bright  red blood per rectum, small  volume yesterday.  She is having 15-20 stools a  day.  She rarely has a solid stool.  She has had some nocturnal diarrhea,  although not very often.  She denies any significant weight loss.  She did  have some weight loss back in May but has gained this back.  She has  heartburn well controlled on Nexium.  No family history of inflammatory  bowel disease, but she had a maternal grandmother and maternal uncle who  both had colon cancer.   MEDICATIONS AT HOME:  1. Tramadol 50 mg every 6 hours as needed for arthritis.  2. Cozaar 50 mg daily.  3. Lexapro 20 mg daily.  4. Nexium 40 mg daily.  5. Premarin 0.625 mg daily.  6. HCTZ 12.5 mg daily.  7. Entocort EC 9 mg daily, just started 6 week course.   ALLERGIES:  IV PHENERGAN RECENTLY CAUSED ITCHING AND AGITATION.   PAST MEDICAL HISTORY:  1. Arthritis.  2. Fibromyalgia.  3. Hiatal hernia.  4. Hypertension.  5. IBS.  6. History of colitis as outlined above.  7. History of diverticulosis.  8. Depression.   FAMILY HISTORY:  As outlined above.   SOCIAL HISTORY:  She lives in [place].  She has a son who lives with  her.  She has 2 sons.  She is widowed, she is retired.  Never been a smoker,  no alcohol use.   PRIMARY CARE PROVIDER:  Bethena [name] at [facility].   REVIEW OF SYSTEMS:  See HPI for GI.  CONSTITUTIONAL:  No weight loss.  CARDIOPULMONARY:  No chest pain or shortness of breath.   PHYSICAL EXAMINATION:  Temp 98, pulse 72, respirations 19, blood pressure  105/63, weight 166.4 pounds, height 62 inches.  GENERAL:  A pleasant elderly Caucasian female in no acute distress.  She is  accompanied by her sister.  Skin warm and dry, no jaundice.  HEENT:  Sclera nonicteric, oropharyngeal mucosa moist and pink, no lesions,  erythema or exudate.  No lymphadenopathy or thyromegaly.  CHEST:  Lungs are clear to auscultation.  CARDIAC EXAM:  Reveals a regular rate and rhythm, normal S1, S2.  No  murmurs, rubs  or gallops.  ABDOMEN:  Positive bowel sounds, soft, nondistended.  She has mild diffuse  abdominal tenderness to deep palpation, no organomegaly or masses.  No  rebound tenderness or guarding.  No abdominal bruits or hernias.  EXTREMITIES:  No edema.   LABS:  White count initially was 14,400, down to 8400.  Hemoglobin initially  12.7, down to 10.4.  Hematocrit 277, sodium 132, potassium 3.2, BUN 12,  creatinine 1, glucose 122.  MCV 85.7, total bilirubin 0.6, alkaline  phosphatase 62, AST 28, down from 47, ALT 26, albumin 2.9, lipase 32.  CT of  the abdomen and pelvis revealed possible stone in the neck of the  gallbladder, no diverticulosis in the sigmoid colon.   IMPRESSION:  Patient is a 71 year old lady with:  1. History of chronic diarrhea, etiology not clear.  She mentions history      of antibiotic-induced colitis and microscopic colitis, currently on      Entocort which was just started.  We will initially try to retrieve her      records, although she may need to have repeat endoscopy for further      workup of her diarrhea.  2. Mild anemia, baseline unknown.  Possibly infarct due hydration, will      continue to follow.  3. Possible gallbladder neck stone on CT, LFTs okay.  Will continue to      monitor.   RECOMMENDATIONS:  1. Retrieve records.  2. A CBC in the morning, follow up stool studies as available.  3. Replete potassium as per hospitalist.  4. Further recommendations to follow.      Marie [name], P.AJonathon [name], M.D.  Electronically Signed    LL/MEDQ  D:  05/28/2006  T:  05/28/2006  Job:  [id number]   cc:   [facility]   Marie [name]  [facility]  [address]  [place], [place] [address]

## 2011-04-25 ENCOUNTER — Telehealth: Payer: Self-pay

## 2011-04-25 NOTE — Telephone Encounter (Signed)
Pt called- she is having episodes of diarrhea that she cannot control. Pt wanted [place] to call her in some Asacol like we did last time. Pt has not been seen since 2009. Advised her we would need to get her in for an OV. Pt stated ok. Please schedule.

## 2011-04-26 ENCOUNTER — Encounter: Payer: Self-pay | Admitting: Gastroenterology

## 2011-04-26 ENCOUNTER — Ambulatory Visit (INDEPENDENT_AMBULATORY_CARE_PROVIDER_SITE_OTHER): Payer: Medicare Other | Admitting: Gastroenterology

## 2011-04-26 VITALS — BP 122/66 | HR 68 | Temp 98.3°F | Ht 63.0 in | Wt 169.4 lb

## 2011-04-26 DIAGNOSIS — R1310 Dysphagia, unspecified: Secondary | ICD-10-CM

## 2011-04-26 DIAGNOSIS — R197 Diarrhea, unspecified: Secondary | ICD-10-CM

## 2011-04-26 NOTE — Patient Instructions (Signed)
Please have blood work done. We will call you with results.  Also, please complete stool studies. Once reviewed, we will likely be starting you back on Lialda 1.2 grams daily. We need to make sure we are not dealing with another issue first.   We will see you back in 6 weeks for follow-up.

## 2011-04-26 NOTE — Telephone Encounter (Signed)
Pt is coming today at 2pm to see AS

## 2011-04-26 NOTE — Progress Notes (Signed)
Referring Provider: No ref. provider found Primary Care Physician:  ? Primary Gastroenterologist: Dr. Jena Gauss   Chief Complaint  Patient presents with  . Diarrhea   HPI:   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 Colonoscopy in Sept 2009 showed left-sided diverticula with collagenous colitis. Unable to afford the entocort. 2.4 g Lialda was added, with constipation after 2 weeks. As of October 2009, she was back to baseline and taking Metamucil daily. Last office visit in 2009 with recommendations to restart Lialda at 1.2 grams daily if diarrhea returns. She returns today with a 3 week hx of diarrhea. Feels unable to control it. Wakes up at night, incontinence. 6 stools today since 3am. Some days worse than others. For the last week it has been worsening. Reports decreased appetite. Some nausea. No abdominal pain. Feels nauseated just before loose stools. No wt loss, melena, or brbpr.  Has dysphagia, especially with hamburger and bread. Chronic. Otherwise, reflux symptoms controlled.  Declined EGD.   No sick contacts. No change in medication. Well water.     Past Medical History  Diagnosis Date  . GERD (gastroesophageal reflux disease)   . Hypertension   . Hypercholesterolemia   . Collagenous colitis     colonoscopy 2009  . Fibromyalgia   . IBS (irritable bowel syndrome)   . Depression   . Lymphocytic colitis     colonoscopy 2007  . S/P endoscopy Sept 2008    small hh, s/p 20 French dilator    Past Surgical History  Procedure Date  . Abdominal hysterectomy   . Fistula of colon   . Nose surgery   . Foot surgery   . Knee surgery     Current Outpatient Prescriptions  Medication Sig Dispense Refill  . citalopram (CELEXA) 20 MG tablet Take 20 mg by mouth daily.        Marland Kitchen esomeprazole (NEXIUM) 40 MG capsule Take 40 mg by mouth daily before breakfast.        .  hydrochlorothiazide (MICROZIDE) 12.5 MG capsule Take 12.5 mg by mouth daily.        . Losartan Potassium (COZAAR PO) Take 75 mg by mouth daily.        Marland Kitchen oxybutynin (DITROPAN) 5 MG tablet Take 5 mg by mouth 3 (three) times daily.        . traMADol (ULTRAM) 50 MG tablet Take 50 mg by mouth every 6 (six) hours as needed.          Allergies as of 04/26/2011  . (No Known Allergies)    Family History  Problem Relation Age of Onset  . Colon cancer Maternal Uncle     History   Social History  . Marital Status: Widowed    Spouse Name: N/A    Number of Children: N/A  . Years of Education: N/A   Social History Main Topics  . Smoking status: Never Smoker   . Smokeless tobacco: None  . Alcohol Use: No  . Drug Use: No  . Sexually Active: None   Other Topics Concern  . None   Social History Narrative  . None    Review of Systems: Gen: Denies fever, chills, anorexia. Denies fatigue, weakness, weight loss.  CV: Denies chest pain, palpitations, syncope, peripheral edema, and claudication. Resp: Denies dyspnea at rest, cough, wheezing, coughing up blood, and pleurisy. GI: Denies vomiting blood, jaundice. + fecal incontinence.   Intermittent  dysphagia, chronic. Derm: Denies rash, itching, dry skin Psych: Denies depression, anxiety, memory loss, confusion. No homicidal or suicidal ideation.  Heme: Denies bruising, bleeding, and enlarged lymph nodes.  Physical Exam: BP 122/66  Pulse 68  Temp(Src) 98.3 F (36.8 C) (Temporal)  Ht 5\' 3"  (1.6 m)  Wt 169 lb 6.4 oz (76.839 kg)  BMI 30.01 kg/m2 General:   Alert and oriented. No distress noted. Pleasant and cooperative.  Head:  Normocephalic and atraumatic. Eyes:  Conjuctiva clear without scleral icterus. Mouth:  Oral mucosa pink and moist. Good dentition. No lesions. Neck:  Supple, without mass or thyromegaly. Heart:  S1, S2 present without murmurs, rubs, or gallops. Regular rate and rhythm. Abdomen:  +BS, soft, mildly TTP RLQ,   non-distended. No rebound or guarding. No HSM or masses noted. Msk:  Symmetrical without gross deformities. Normal posture. Extremities:  Without edema. Neurologic:  Alert and  oriented x4;  grossly normal neurologically. Skin:  Intact without significant lesions or rashes. Cervical Nodes:  No significant cervical adenopathy. Psych:  Alert and cooperative. Normal mood and affect.

## 2011-04-27 ENCOUNTER — Other Ambulatory Visit: Payer: Self-pay | Admitting: Gastroenterology

## 2011-04-27 LAB — BASIC METABOLIC PANEL
BUN: 17 mg/dL (ref 6–23)
CO2: 25 mEq/L (ref 19–32)
Calcium: 9.4 mg/dL (ref 8.4–10.5)
Chloride: 101 mEq/L (ref 96–112)
Creat: 0.73 mg/dL (ref 0.50–1.10)
Glucose, Bld: 85 mg/dL (ref 70–99)
Potassium: 4.1 mEq/L (ref 3.5–5.3)
Sodium: 138 mEq/L (ref 135–145)

## 2011-04-27 LAB — CBC WITH DIFFERENTIAL/PLATELET
Basophils Absolute: 0 10*3/uL (ref 0.0–0.1)
Basophils Relative: 0 % (ref 0–1)
Eosinophils Absolute: 0.1 10*3/uL (ref 0.0–0.7)
Eosinophils Relative: 1 % (ref 0–5)
HCT: 35 % — ABNORMAL LOW (ref 36.0–46.0)
Hemoglobin: 11.3 g/dL — ABNORMAL LOW (ref 12.0–15.0)
Lymphocytes Relative: 40 % (ref 12–46)
Lymphs Abs: 2.2 10*3/uL (ref 0.7–4.0)
MCH: 28.2 pg (ref 26.0–34.0)
MCHC: 32.3 g/dL (ref 30.0–36.0)
MCV: 87.3 fL (ref 78.0–100.0)
Monocytes Absolute: 0.6 10*3/uL (ref 0.1–1.0)
Monocytes Relative: 11 % (ref 3–12)
Neutro Abs: 2.6 10*3/uL (ref 1.7–7.7)
Neutrophils Relative %: 48 % (ref 43–77)
Platelets: 244 10*3/uL (ref 150–400)
RBC: 4.01 MIL/uL (ref 3.87–5.11)
RDW: 12.9 % (ref 11.5–15.5)
WBC: 5.4 10*3/uL (ref 4.0–10.5)

## 2011-04-28 LAB — FECAL LACTOFERRIN, QUANT: Lactoferrin: POSITIVE

## 2011-04-30 ENCOUNTER — Encounter: Payer: Self-pay | Admitting: Gastroenterology

## 2011-04-30 ENCOUNTER — Other Ambulatory Visit: Payer: Self-pay | Admitting: Gastroenterology

## 2011-04-30 DIAGNOSIS — R197 Diarrhea, unspecified: Secondary | ICD-10-CM | POA: Insufficient documentation

## 2011-04-30 LAB — GIARDIA ANTIGEN: Giardia Screen (EIA): NEGATIVE

## 2011-04-30 MED ORDER — MESALAMINE 1.2 G PO TBEC: 1200.0000 mg | DELAYED_RELEASE_TABLET | Freq: Every day | ORAL | Status: DC

## 2011-04-30 NOTE — Telephone Encounter (Signed)
Pt needs lialdo called in to [facility]- she was seen on the 04/26/11

## 2011-04-30 NOTE — Assessment & Plan Note (Signed)
70 year old female with history of collagenous colitis, likely with recurrence. In past, unable to afford entocort, treated with Lialda in past. Per last OV note in 2009, restart Lialda at 1.2 grams daily. However, we will assess with stool studies first to ensure this is not infectious process. Likely it is not. Patient is agreeable to this. Will also add CBC and BMP.   Cdiff PCR, Giardia, Lactoferrin, Stool culture CBC, BMP Lialda 1.2 grams daily Further recommendations to follow 1 mos f/u.

## 2011-04-30 NOTE — Progress Notes (Signed)
No PCP on file 

## 2011-04-30 NOTE — Assessment & Plan Note (Addendum)
Last EGD in Sept 2008. Declining dilation at this time. Hx of chronic dysphagia. Difficulty with some meats and bread. Reflux controlled.

## 2011-05-01 LAB — CLOSTRIDIUM DIFFICILE BY PCR: Toxigenic C. Difficile by PCR: NOT DETECTED

## 2011-05-01 LAB — STOOL CULTURE

## 2011-05-09 LAB — FECAL LACTOFERRIN, QUANT: Fecal Lactoferrin: POSITIVE

## 2011-05-09 LAB — CLOSTRIDIUM DIFFICILE EIA: C difficile Toxins A+B, EIA: NEGATIVE

## 2011-05-09 LAB — OVA AND PARASITE EXAMINATION: Ova and parasites: NONE SEEN

## 2011-05-09 LAB — STOOL CULTURE

## 2011-05-10 ENCOUNTER — Telehealth: Payer: Self-pay

## 2011-05-10 NOTE — Progress Notes (Signed)
Quick Note:  Start Entocort 9 mg daily. Once starting entocort, stop lialda.   Please call in Entocort 9 mg, take 1 po daily, disp# 60 with 0 refills for now.  Return in 2 weeks. Contact us if no improvement. ______

## 2011-05-10 NOTE — Telephone Encounter (Signed)
Has been on the Lialda since Sept 20 but it is not work she still have the diarrhea and would like to know the result stool test. Call back at [phone] but will not be there 10:00 but will be back after lunch.

## 2011-05-11 NOTE — Telephone Encounter (Signed)
This has been addressed- see result note.

## 2011-05-14 NOTE — Progress Notes (Signed)
Quick Note:  Since PA denied, let's increase Lialda to 2.4 grams daily. Please inform pt. If no improvement in next 2 weeks, may need to try pepto or alternative such as that. ______

## 2011-05-14 NOTE — Progress Notes (Signed)
Since PA denied, let's increase Lialda to 2.4 grams daily. Please inform pt. If no improvement in next 2 weeks, may need to try pepto or alternative such as that.

## 2011-05-14 NOTE — Progress Notes (Signed)
Quick Note:  Tried to call pt- LM with son to return call. ______

## 2011-05-15 NOTE — Progress Notes (Signed)
Quick Note:  Tried to call pt- NA ______ 

## 2011-05-15 NOTE — Progress Notes (Signed)
Quick Note:  Pt aware ______ 

## 2011-06-07 ENCOUNTER — Encounter: Payer: Self-pay | Admitting: Gastroenterology

## 2011-06-07 ENCOUNTER — Ambulatory Visit: Payer: Medicare Other | Admitting: Gastroenterology

## 2011-06-07 ENCOUNTER — Ambulatory Visit (INDEPENDENT_AMBULATORY_CARE_PROVIDER_SITE_OTHER): Payer: Medicare Other | Admitting: Gastroenterology

## 2011-06-07 VITALS — BP 130/69 | HR 66 | Temp 97.2°F | Ht 64.0 in | Wt 166.0 lb

## 2011-06-07 DIAGNOSIS — R197 Diarrhea, unspecified: Secondary | ICD-10-CM

## 2011-06-07 MED ORDER — DICYCLOMINE HCL 10 MG PO CAPS: 10.0000 mg | ORAL_CAPSULE | Freq: Two times a day (BID) | ORAL | Status: DC

## 2011-06-07 MED ORDER — BISMUTH SUBSALICYLATE 262 MG PO CHEW: CHEWABLE_TABLET | ORAL | Status: DC

## 2011-06-07 NOTE — Patient Instructions (Signed)
Stop Lialda. Start Pepto Bismol tabs. Dosing is 3 tabs, three times per day. Be aware that this can cause dark stools, so do not be alarmed.   Prescription for Bentyl has been sent to your pharmacy. Take this in the morning and in evening as needed for belly discomfort.   Start taking a Probiotic daily. Samples have been provided.   Please contact us next week to let us know how you are doing. If no improvement in the next several weeks, we may need to proceed with a colonoscopy.

## 2011-06-07 NOTE — Progress Notes (Signed)
Referring Provider: No ref. provider found Primary Care Physician:  Forest Gleason, MD, MD Primary Gastroenterologist: Dr. Jena Gauss   Chief Complaint  Patient presents with  . Follow-up  . Diarrhea    HPI:   Ms. Pfund is a pleasant 70 year old female with hx of previously diagnosed lymphocytic colitis in Oct 2007 with poor response to Entocort. Colonoscopy in Sept 2009 showed left-sided diverticula with collagenous colitis. Unable to afford the entocort. 2.4 g Lialda was added, with constipation after 2 weeks. As of October 2009, she was back to baseline and taking Metamucil daily. Last office visit in 2009 with recommendations to restart Lialda at 1.2 grams daily if diarrhea returns. I saw her Sept 20, with complaints of 3 week hx of diarrhea. She denied and continues to deny melena or brbpr. Stool studies were done and negative except for positive lactoferrin. CBC with mild normocytic anemia with Hgb 11.3, Hct 35. She was started on low-dose Lialda per past recommendations, as this seemed to work best for her in the past. She was increased to 2.4 mg per day approximately 3 weeks ago. As of note, Entocort was denied by her insurance. She states there is mild improvement in diarrhea; however, this is not significant. Reports 7-8 loose stools yesterday. Sometimes feels like has to go and nothing comes up. Has intermittent lower abdominal cramping but not severe. She is tired of diarrhea, states her head hurts. Seems somewhat defeated. States appetite is poor, as she is afraid to eat because of diarrhea. She is down 3 lbs.    Past Medical History  Diagnosis Date  . GERD (gastroesophageal reflux disease)   . Hypertension   . Hypercholesterolemia   . Collagenous colitis     colonoscopy 2009  . Fibromyalgia   . IBS (irritable bowel syndrome)   . Depression   . Lymphocytic colitis     colonoscopy 2007  . S/P endoscopy Sept 2008    small hh, s/p 36 French dilator    Past Surgical History    Procedure Date  . Abdominal hysterectomy   . Fistula of colon   . Nose surgery   . Foot surgery   . Knee surgery   . Colonoscopy 04/13/2008    anal papilla and internal hemorrhage otherwise normal /left side diverticula    Current Outpatient Prescriptions  Medication Sig Dispense Refill  . citalopram (CELEXA) 20 MG tablet Take 20 mg by mouth daily.        Marland Kitchen esomeprazole (NEXIUM) 40 MG capsule Take 40 mg by mouth daily before breakfast.        . hydrochlorothiazide (MICROZIDE) 12.5 MG capsule Take 12.5 mg by mouth daily.        . Losartan Potassium (COZAAR PO) Take 75 mg by mouth daily.        . mesalamine (LIALDA) 1.2 G EC tablet Take 1 tablet (1.2 g total) by mouth daily with breakfast.  31 tablet  1  . oxybutynin (DITROPAN) 5 MG tablet Take 5 mg by mouth 3 (three) times daily.        . traMADol (ULTRAM) 50 MG tablet Take 50 mg by mouth every 6 (six) hours as needed.        . bismuth subsalicylate (PEPTO-BISMOL) 262 MG chewable tablet Chew 3 tablets three times daily.  270 tablet  1  . dicyclomine (BENTYL) 10 MG capsule Take 1 capsule (10 mg total) by mouth 2 (two) times daily.  60 capsule  1    Allergies as of 06/07/2011  . (  No Known Allergies)    Family History  Problem Relation Age of Onset  . Colon cancer Maternal Uncle     History   Social History  . Marital Status: Widowed    Spouse Name: N/A    Number of Children: N/A  . Years of Education: N/A   Social History Main Topics  . Smoking status: Never Smoker   . Smokeless tobacco: None  . Alcohol Use: No  . Drug Use: No  . Sexually Active: None   Other Topics Concern  . None   Social History Narrative  . None    Review of Systems: Gen: SEE HPI  CV: Denies chest pain, palpitations, syncope, peripheral edema, and claudication. Resp: Denies dyspnea at rest, cough, wheezing, coughing up blood, and pleurisy. GI: SEE HPI Derm: Denies rash, itching, dry skin Psych: Denies depression, anxiety, memory loss,  confusion. No homicidal or suicidal ideation.  Heme: Denies bruising, bleeding, and enlarged lymph nodes.  Physical Exam: BP 130/69  Pulse 66  Temp(Src) 97.2 F (36.2 C) (Temporal)  Ht 5\' 4"  (1.626 m)  Wt 166 lb (75.297 kg)  BMI 28.49 kg/m2 General:   Alert and oriented. No distress noted. Pleasant and cooperative.  Head:  Normocephalic and atraumatic. Eyes:  Conjuctiva clear without scleral icterus. Mouth:  Oral mucosa pink and moist. Good dentition. No lesions. Heart:  S1, S2 present without murmurs, rubs, or gallops. Regular rate and rhythm. Abdomen:  +BS, soft, non-tender and non-distended. No rebound or guarding. No HSM or masses noted. Msk:  Symmetrical without gross deformities. Normal posture. Extremities:  Without edema. Neurologic:  Alert and  oriented x4;  grossly normal neurologically. Skin:  Intact without significant lesions or rashes. Psych:  Alert and cooperative. Normal mood and affect.

## 2011-06-08 ENCOUNTER — Telehealth: Payer: Self-pay | Admitting: Gastroenterology

## 2011-06-08 NOTE — Telephone Encounter (Signed)
Patient is asking if she can take a 10 day supply of  entocort EC 3mg  but the expiration date on it is 11/12/07 she is asking is this safe to take??

## 2011-06-08 NOTE — Telephone Encounter (Signed)
Routed to AS 

## 2011-06-08 NOTE — Assessment & Plan Note (Signed)
70 year old female with hx of collagenous colitis, likely recurrence. Entocort denied this round. She was treated with Lialda and had good response in the past; however, she does not note much improvement despite being on Lialda 1.2 grams for 2 weeks and 2.4 grams for approximately 3 weeks. She seems somewhat defeated. We will stop Lialda, switch to Pepto. Due to her hx of IBS, will add Bentyl for supportive measures. Needs probiotic as well. She is to contact us next week with an update.

## 2011-06-08 NOTE — Telephone Encounter (Signed)
NO. Needs to stick with Pepto and Bentyl for now.

## 2011-06-11 NOTE — Progress Notes (Signed)
Cc to PCP 

## 2011-06-11 NOTE — Telephone Encounter (Signed)
Pt aware.

## 2011-07-03 ENCOUNTER — Encounter: Payer: Self-pay | Admitting: Gastroenterology

## 2011-07-03 NOTE — Progress Notes (Unsigned)
Need update on pt; started pepto early November for hx of collagenous colitis.

## 2011-07-04 NOTE — Progress Notes (Signed)
Tried to call pt- LMOM 

## 2011-07-05 NOTE — Progress Notes (Signed)
Spoke with pt, she said the diarrhea never completely stopped but she was feeling better until this week. She has had several episodes of diarrhea this week. She ran out of pepto on Tuesday and is going to get some more today.  Pt is requesting tcs. She said her last one was 3 years ago and she just wants to make sure everything is ok.   Please advise.

## 2011-07-11 NOTE — Progress Notes (Unsigned)
Needs to continue Pepto a total of 8 weeks. May use imodium prn for diarrhea AVOID NSAIDs Since diarrhea is improving, hold off on colonoscopy. We know she has a history of this. Would not shed much light. Is she having any bloody diarrhea?   Have her return in 1-2 weeks. She may have good and bad days, but overall should be improving.

## 2011-07-12 NOTE — Progress Notes (Signed)
Pt aware, please schedule appt next week.

## 2011-07-18 ENCOUNTER — Encounter: Payer: Self-pay | Admitting: Internal Medicine

## 2011-07-18 NOTE — Progress Notes (Signed)
Pt is aware of OV for 12/20 @ 230 w/AS and appt card was mailed

## 2011-07-26 ENCOUNTER — Encounter: Payer: Self-pay | Admitting: Gastroenterology

## 2011-07-26 ENCOUNTER — Ambulatory Visit (INDEPENDENT_AMBULATORY_CARE_PROVIDER_SITE_OTHER): Payer: Medicare Other | Admitting: Gastroenterology

## 2011-07-26 VITALS — BP 123/67 | HR 71 | Temp 98.2°F | Ht 64.0 in | Wt 166.4 lb

## 2011-07-26 DIAGNOSIS — M359 Systemic involvement of connective tissue, unspecified: Secondary | ICD-10-CM

## 2011-07-26 NOTE — Progress Notes (Signed)
Do entocort. ?celiac? Recheck CBC, possible ifobt  Referring Provider: Forest Gleason, MD Primary Care Physician:  Forest Gleason, MD, MD  Chief Complaint  Patient presents with  . Follow-up  . Diarrhea    HPI:   [name] presents today in f/u with hx of collagenous colitis, diagnosed via colonoscopy 2009. She had done well with Lialda in the remote past. I have been seeing her recently in follow-up since late September with recurrent diarrhea. Stool studies negative except for positive lactoferrin. She was started on Lialda per recommendations from last office visit, as she had done well with this in the past. After several weeks, there was no improvement. Entocort was denied by insurance. We then changed to Pepto, and she had noted some improvement with this. However, it is still not optimal.   She reports good and bad days, but she mostly averages up to 6 loose stools per day. Complains of fecal urgency in early morning and at night. Denies any melena or hematochezia. Notes lower abdominal, intermittent cramping prior to loose stools. Imodium does not seem to help. She is avoiding NSAIDs, chocolates, fried and fatty foods.   Sept 169 Nov 166 Dec 166  Past Medical History  Diagnosis Date  . GERD (gastroesophageal reflux disease)   . Hypertension   . Hypercholesterolemia   . Collagenous colitis     colonoscopy 2009  . Fibromyalgia   . IBS (irritable bowel syndrome)   . Depression   . Lymphocytic colitis     colonoscopy 2007  . S/P endoscopy Sept 2008    small hh, s/p 79 French dilator    Past Surgical History  Procedure Date  . Abdominal hysterectomy   . Fistula of colon   . Nose surgery   . Foot surgery   . Knee surgery   . Colonoscopy 04/13/2008    anal papilla and internal hemorrhage otherwise normal /left side diverticula    Current Outpatient Prescriptions  Medication Sig Dispense Refill  . bismuth subsalicylate (PEPTO-BISMOL) 262 MG chewable tablet Chew 3 tablets three  times daily.  270 tablet  1  . citalopram (CELEXA) 20 MG tablet Take 20 mg by mouth daily.        Marland Kitchen dicyclomine (BENTYL) 10 MG capsule Take 1 capsule (10 mg total) by mouth 2 (two) times daily.  60 capsule  1  . esomeprazole (NEXIUM) 40 MG capsule Take 40 mg by mouth daily before breakfast.        . hydrochlorothiazide (MICROZIDE) 12.5 MG capsule Take 12.5 mg by mouth daily.        . Losartan Potassium (COZAAR PO) Take 75 mg by mouth daily.        . mesalamine (LIALDA) 1.2 G EC tablet Take 1 tablet (1.2 g total) by mouth daily with breakfast.  31 tablet  1  . oxybutynin (DITROPAN) 5 MG tablet Take 5 mg by mouth 3 (three) times daily.        . traMADol (ULTRAM) 50 MG tablet Take 50 mg by mouth every 6 (six) hours as needed.          Allergies as of 07/26/2011  . (No Known Allergies)    Family History  Problem Relation Age of Onset  . Colon cancer Maternal Uncle     History   Social History  . Marital Status: Widowed    Spouse Name: N/A    Number of Children: N/A  . Years of Education: N/A   Social History Main Topics  .  Smoking status: Never Smoker   . Smokeless tobacco: None  . Alcohol Use: No  . Drug Use: No  . Sexually Active: None   Other Topics Concern  . None   Social History Narrative  . None    Review of Systems: Gen: Denies fever, chills, anorexia. Denies fatigue, weakness, weight loss.  CV: Denies chest pain, palpitations, syncope, peripheral edema, and claudication. Resp: Denies dyspnea at rest, cough, wheezing, coughing up blood, and pleurisy. GI: SEE HPI Derm: Denies rash, itching, dry skin Psych: Denies depression, anxiety, memory loss, confusion. No homicidal or suicidal ideation.  Heme: Denies bruising, bleeding, and enlarged lymph nodes.  Physical Exam: BP 123/67  Pulse 71  Temp(Src) 98.2 F (36.8 C) (Temporal)  Ht 5\' 4"  (1.626 m)  Wt 166 lb 6.4 oz (75.479 kg)  BMI 28.56 kg/m2 General:   Alert and oriented. No distress noted. Pleasant and  cooperative.  Head:  Normocephalic and atraumatic. Eyes:  Conjuctiva clear without scleral icterus. Mouth:  Oral mucosa pink and moist. Good dentition. No lesions. Heart:  S1, S2 present without murmurs, rubs, or gallops. Regular rate and rhythm. Abdomen:  +BS, soft, non-tender and non-distended. No rebound or guarding. No HSM or masses noted. Msk:  Symmetrical without gross deformities. Normal posture. Extremities:  Without edema. Neurologic:  Alert and  oriented x4;  grossly normal neurologically. Skin:  Intact without significant lesions or rashes. Psych:  Alert and cooperative. Normal mood and affect.

## 2011-07-26 NOTE — Patient Instructions (Signed)
Continue taking Bentyl and Pepto. You may take imodium as needed. Continue to avoid the foods that seem to make the diarrhea worse.  I will be talking with Dr. Jena Gauss regarding the next step and be in touch with you shortly.   Merry Christmas!

## 2011-07-27 ENCOUNTER — Encounter: Payer: Self-pay | Admitting: Gastroenterology

## 2011-07-27 MED ORDER — BUDESONIDE 3 MG PO CP24: 9.0000 mg | ORAL_CAPSULE | Freq: Every day | ORAL | Status: DC

## 2011-07-27 NOTE — Progress Notes (Unsigned)
I have spoken to Dr. Jena Gauss about Marie [name]. We need to do the following:  Start Entocort 9 mg daily. I have sent rx to pharmacy. Will likely need to fight with insurance, as I believe it was denied before. I'd like to add a celiac panel, CBC, and ifobt. Return in 2 weeks.

## 2011-07-27 NOTE — Assessment & Plan Note (Addendum)
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 She has failed to improve with both Lialda (which she did well with in the past) as well as Pepto. Her wt has remained stable the last 2 visits. She was originally denied Entocort. Case discussed with Dr. Jena Gauss. We will proceed to attempt Entocort again, as there really should be no reason the insurance does not cover this, as she has tried several different therapies already. Not noted above, celiac panel tested several years ago and was negative. I'd like to update some labs and obtain an ifobt as well. We will not plan on a colonoscopy unless there are signs of heme + stool, wt loss, further anemia.   Entocort 9 mg po daily for at least 6 weeks, with taper as appropriate Stop Pepto once Entocort has started ifobt CBC Updated celiac panel Return in 2 weeks.

## 2011-08-01 NOTE — Progress Notes (Unsigned)
I spoke with pt personally today. She will be by to pick up labs and ifobt early next week.  I have not printed labs yet. Thanks!~

## 2011-08-01 NOTE — Progress Notes (Signed)
Cc to PCP 

## 2011-08-01 NOTE — Progress Notes (Signed)
PA form for entocort on AS desk.

## 2011-08-03 ENCOUNTER — Other Ambulatory Visit: Payer: Self-pay | Admitting: Gastroenterology

## 2011-08-03 DIAGNOSIS — R197 Diarrhea, unspecified: Secondary | ICD-10-CM

## 2011-08-03 NOTE — Progress Notes (Signed)
Please schedule ov.  

## 2011-08-03 NOTE — Progress Notes (Signed)
Lab order done and at front desk. ifobt at front desk.

## 2011-08-06 ENCOUNTER — Emergency Department (HOSPITAL_COMMUNITY): Payer: Medicare Other

## 2011-08-06 ENCOUNTER — Encounter (HOSPITAL_COMMUNITY): Payer: Self-pay

## 2011-08-06 ENCOUNTER — Emergency Department (HOSPITAL_COMMUNITY)
Admission: EM | Admit: 2011-08-06 | Discharge: 2011-08-06 | Disposition: A | Discharge status: Home / Self Care | Payer: Medicare Other | Attending: Emergency Medicine | Admitting: Emergency Medicine

## 2011-08-06 DIAGNOSIS — B349 Viral infection, unspecified: Secondary | ICD-10-CM

## 2011-08-06 DIAGNOSIS — E7800 Pure hypercholesterolemia, unspecified: Secondary | ICD-10-CM | POA: Insufficient documentation

## 2011-08-06 DIAGNOSIS — B9789 Other viral agents as the cause of diseases classified elsewhere: Secondary | ICD-10-CM | POA: Insufficient documentation

## 2011-08-06 DIAGNOSIS — R197 Diarrhea, unspecified: Secondary | ICD-10-CM | POA: Insufficient documentation

## 2011-08-06 DIAGNOSIS — R05 Cough: Secondary | ICD-10-CM | POA: Insufficient documentation

## 2011-08-06 DIAGNOSIS — F3289 Other specified depressive episodes: Secondary | ICD-10-CM | POA: Insufficient documentation

## 2011-08-06 DIAGNOSIS — Z9889 Other specified postprocedural states: Secondary | ICD-10-CM | POA: Insufficient documentation

## 2011-08-06 DIAGNOSIS — IMO0001 Reserved for inherently not codable concepts without codable children: Secondary | ICD-10-CM | POA: Insufficient documentation

## 2011-08-06 DIAGNOSIS — F329 Major depressive disorder, single episode, unspecified: Secondary | ICD-10-CM | POA: Insufficient documentation

## 2011-08-06 DIAGNOSIS — K589 Irritable bowel syndrome without diarrhea: Secondary | ICD-10-CM | POA: Insufficient documentation

## 2011-08-06 DIAGNOSIS — R059 Cough, unspecified: Secondary | ICD-10-CM | POA: Insufficient documentation

## 2011-08-06 DIAGNOSIS — Z9079 Acquired absence of other genital organ(s): Secondary | ICD-10-CM | POA: Insufficient documentation

## 2011-08-06 DIAGNOSIS — R10819 Abdominal tenderness, unspecified site: Secondary | ICD-10-CM | POA: Insufficient documentation

## 2011-08-06 DIAGNOSIS — K219 Gastro-esophageal reflux disease without esophagitis: Secondary | ICD-10-CM | POA: Insufficient documentation

## 2011-08-06 LAB — CBC
HCT: 37.1 % (ref 36.0–46.0)
Hemoglobin: 12.3 g/dL (ref 12.0–15.0)
MCH: 28.5 pg (ref 26.0–34.0)
MCHC: 33.2 g/dL (ref 30.0–36.0)
MCV: 85.9 fL (ref 78.0–100.0)
Platelets: 234 10*3/uL (ref 150–400)
RBC: 4.32 MIL/uL (ref 3.87–5.11)
RDW: 12.8 % (ref 11.5–15.5)
WBC: 5.2 10*3/uL (ref 4.0–10.5)

## 2011-08-06 LAB — BASIC METABOLIC PANEL
BUN: 15 mg/dL (ref 6–23)
CO2: 29 mEq/L (ref 19–32)
Calcium: 9.5 mg/dL (ref 8.4–10.5)
Chloride: 98 mEq/L (ref 96–112)
Creatinine, Ser: 0.81 mg/dL (ref 0.50–1.10)
GFR calc Af Amer: 85 mL/min — ABNORMAL LOW (ref >90)
GFR calc non Af Amer: 73 mL/min — ABNORMAL LOW (ref >90)
Glucose, Bld: 99 mg/dL (ref 70–99)
Potassium: 3.1 mEq/L — ABNORMAL LOW (ref 3.5–5.1)
Sodium: 136 mEq/L (ref 135–145)

## 2011-08-06 LAB — DIFFERENTIAL
Basophils Absolute: 0 10*3/uL (ref 0.0–0.1)
Basophils Relative: 1 % (ref 0–1)
Eosinophils Absolute: 0.1 10*3/uL (ref 0.0–0.7)
Eosinophils Relative: 1 % (ref 0–5)
Lymphocytes Relative: 44 % (ref 12–46)
Lymphs Abs: 2.3 10*3/uL (ref 0.7–4.0)
Monocytes Absolute: 0.8 10*3/uL (ref 0.1–1.0)
Monocytes Relative: 15 % — ABNORMAL HIGH (ref 3–12)
Neutro Abs: 2 10*3/uL (ref 1.7–7.7)
Neutrophils Relative %: 39 % — ABNORMAL LOW (ref 43–77)

## 2011-08-06 MED ORDER — SODIUM CHLORIDE 0.9 % IV SOLN
INTRAVENOUS | Status: DC
  Administered 2011-08-06: 1000 mL via INTRAVENOUS

## 2011-08-06 NOTE — ED Notes (Signed)
Patient does not need anything at this time. 

## 2011-08-06 NOTE — ED Provider Notes (Signed)
Scribed for Nicholes Stairs, MD, the patient was seen in room APA17/APA17 . This chart was scribed by Ellie Lunch.   CSN: [id number]  Arrival date & time 08/06/11  1104   First MD Initiated Contact with Patient 08/06/11 1121      Chief Complaint  Patient presents with  . URI    (Consider location/radiation/quality/duration/timing/severity/associated sxs/prior treatment) HPI Pt seen at 11:40 AM Marie [name] is a 70 y.o. female who presents to the Emergency Department complaining of 4 days of fever with associated cough and chills. Additionally Pt also c/o recurrent diarrhea since 04/2011 that has become worse with current fever.  Pt reports that fever, cough and chills have gradually improved since onset but diarrhea has worsened. Pt describes diarrhea as severe. C/o feeling weak and lightheaded from diarrhea.  Pt denies any n/v or current abdominal pain. Pt has seen Gastroenterologist Dr. Jena Gauss for diarrhea. Pt has no h/o COPD, bronchitis.  PCP Dr. Shelva Majestic.    Past Medical History  Diagnosis Date  . GERD (gastroesophageal reflux disease)   . Hypertension   . Hypercholesterolemia   . Collagenous colitis     colonoscopy 2009  . Fibromyalgia   . IBS (irritable bowel syndrome)   . Depression   . Lymphocytic colitis     colonoscopy 2007  . S/P endoscopy Sept 2008    small hh, s/p 54 French dilator    Past Surgical History  Procedure Date  . Abdominal hysterectomy   . Fistula of colon   . Nose surgery   . Foot surgery   . Knee surgery   . Colonoscopy 04/13/2008    anal papilla and internal hemorrhage otherwise normal /left side diverticula    Family History  Problem Relation Age of Onset  . Colon cancer Maternal Uncle     History  Substance Use Topics  . Smoking status: Never Smoker   . Smokeless tobacco: Not on file  . Alcohol Use: No     Review of Systems 10 Systems reviewed and are negative for acute change except as noted in the HPI.   Allergies    Review of patient's allergies indicates no known allergies.  Home Medications   Current Outpatient Rx  Name Route Sig Dispense Refill  . BISMUTH SUBSALICYLATE 262 MG PO CHEW  Chew 3 tablets three times daily. 270 tablet 1  . CITALOPRAM HYDROBROMIDE 20 MG PO TABS Oral Take 20 mg by mouth daily.      Marland Kitchen DICYCLOMINE HCL 10 MG PO CAPS Oral Take 1 capsule (10 mg total) by mouth 2 (two) times daily. 60 capsule 1  . ESOMEPRAZOLE MAGNESIUM 40 MG PO CPDR Oral Take 40 mg by mouth daily before breakfast.      . HYDROCHLOROTHIAZIDE 12.5 MG PO CAPS Oral Take 12.5 mg by mouth daily.      Marland Kitchen COZAAR PO Oral Take 75 mg by mouth daily.      . OXYBUTYNIN CHLORIDE 5 MG PO TABS Oral Take 5 mg by mouth 3 (three) times daily.      . TRAMADOL HCL 50 MG PO TABS Oral Take 50 mg by mouth every 6 (six) hours as needed. For pain      BP 135/72  Pulse 78  Temp(Src) 98.4 F (36.9 C) (Oral)  Resp 16  Ht 5\' 3"  (1.6 m)  Wt 164 lb (74.39 kg)  BMI 29.05 kg/m2  SpO2 98%  Physical Exam  Nursing note and vitals reviewed. Constitutional: She is oriented to person,  place, and time. She appears well-developed and well-nourished.  HENT:  Head: Normocephalic and atraumatic.  Eyes: Conjunctivae are normal. No scleral icterus.  Cardiovascular: Normal rate, regular rhythm and normal heart sounds.   Pulmonary/Chest: Effort normal and breath sounds normal. No respiratory distress. She has no wheezes. She has no rales.  Abdominal: Soft. Bowel sounds are normal. There is tenderness (mild tenderness on left side of abdomen).  Neurological: She is alert and oriented to person, place, and time.  Skin: Skin is warm and dry.  Psychiatric: She has a normal mood and affect.    ED Course  Procedures (including critical care time) DIAGNOSTIC STUDIES: Oxygen Saturation is 98% on room air, normal by my interpretation.    COORDINATION OF CARE:  Labs Reviewed  DIFFERENTIAL - Abnormal; Notable for the following:    Neutrophils  Relative 39 (*)    Monocytes Relative 15 (*)    All other components within normal limits  BASIC METABOLIC PANEL - Abnormal; Notable for the following:    Potassium 3.1 (*)    GFR calc non Af Amer 73 (*)    GFR calc Af Amer 85 (*)    All other components within normal limits  CBC   Dg Chest 2 View  08/06/2011  *RADIOLOGY REPORT*  Clinical Data: Cough, fever  CHEST - 2 VIEW  Comparison: None.  Findings: Lungs are clear. No pleural effusion or pneumothorax.  Cardiomediastinal silhouette is within normal limits.  Mild degenerative changes of the visualized thoracolumbar spine.  IMPRESSION: No evidence of acute cardiopulmonary disease.  Original Report Authenticated By: Charline Bills, M.D.   ED MEDICATIONS Medications  0.9 %  sodium chloride infusion (1000 mL Intravenous New Bag/Given 08/06/11 1213)      No diagnosis found.  2:06 PM Feels better after ivf  MDM  Viral illness Diarrhea No toxicity. No distress  I personally performed the services described in this documentation, which was scribed in my presence. The recorded information has been reviewed and considered.       Nicholes Stairs, MD 08/06/11 [phone]

## 2011-08-06 NOTE — ED Notes (Signed)
Pt reports cough and fever since Thursday.  Pt reports has been having diarrhea and Dr. Jena Gauss is seeing her for that.  PT says diarrhea has gotten worse since she has been sick.  Pt c/o feeling lightheaded.  Also reports is supposed to have some lab work and stool samples done and wants to know if we can do that today.

## 2011-08-07 ENCOUNTER — Telehealth: Payer: Self-pay | Admitting: Gastroenterology

## 2011-08-07 MED ORDER — BUDESONIDE 3 MG PO CP24: 9.0000 mg | ORAL_CAPSULE | ORAL | Status: DC

## 2011-08-07 NOTE — Telephone Encounter (Signed)
Patient's insurance states they cover generic Entocort. RX sent to pharmacy. Needs OV two weeks 3-4 weeks after starting Entocort.

## 2011-08-08 ENCOUNTER — Encounter: Payer: Self-pay | Admitting: Internal Medicine

## 2011-08-08 NOTE — Telephone Encounter (Signed)
Routed to S&S

## 2011-08-08 NOTE — Telephone Encounter (Signed)
Pt is aware of OV on 1/23 at 1030 with LSL and appt card was mailed

## 2011-08-08 NOTE — Progress Notes (Signed)
Pt is aware of OV on 1/23 at 1030 with LSL

## 2011-08-08 NOTE — Telephone Encounter (Signed)
Please schedule pt appt in 3-4 weeks

## 2011-08-10 ENCOUNTER — Telehealth: Payer: Self-pay | Admitting: Internal Medicine

## 2011-08-10 NOTE — Telephone Encounter (Signed)
Pt called to speak with nurse or AS. I told her that I would have RMR's nurse call her back in a few minutes that she had to step out for a few moments. You can reach pt at [phone]

## 2011-08-10 NOTE — Telephone Encounter (Signed)
Still needs celiac lab when she feels up to it. Looks like patient did not get msg about generic entocort on 08/08/11 when I addressed it. Touch base with patient on Monday to see how she is doing.

## 2011-08-10 NOTE — Telephone Encounter (Signed)
Spoke with pt- she said she has been sick since last weekend with diarrhea and feeling terrible. She thinks its a virus. She went to ED last weekend and they gave her fluids and drew some blood and she felt a little better after that. Advised pt to stick to clear liquids then advance to a BRAT diet and to avoid anything irritating to her stomach. And to make sure that she got in as many fluids as possible to avoid dehydration. Also advised her that if this continues and  she felt weak or dehydrated  over the weekend she needed to go to ED for fluids. Pt wants to know if AS still needs blood work done since she just had some labs at the hospital. She is also aware that she needs to turn in ifobt and she said she would do that when she felt better. Pt has ov with LSL on 08/29/11. Pt has not started entocort yet. She said insurance denied it. Informed pt that her insurance would pay for generic and rx was sent to her pharmacy and asked her to call them.

## 2011-08-13 NOTE — Telephone Encounter (Signed)
Called pt- she said she is still  having watery diarrhea, no fever. She checked on generic entocort and it is still 700.00 and pt stated she couldn't afford that.  Pt is asking if LSL or AS can call her sometime today.

## 2011-08-14 ENCOUNTER — Other Ambulatory Visit: Payer: Self-pay | Admitting: Gastroenterology

## 2011-08-14 ENCOUNTER — Emergency Department (HOSPITAL_COMMUNITY)
Admission: EM | Admit: 2011-08-14 | Discharge: 2011-08-14 | Disposition: A | Discharge status: Home / Self Care | Payer: Medicare Other | Attending: Emergency Medicine | Admitting: Emergency Medicine

## 2011-08-14 ENCOUNTER — Encounter (HOSPITAL_COMMUNITY): Payer: Self-pay | Admitting: *Deleted

## 2011-08-14 ENCOUNTER — Telehealth: Payer: Self-pay

## 2011-08-14 DIAGNOSIS — N390 Urinary tract infection, site not specified: Secondary | ICD-10-CM

## 2011-08-14 DIAGNOSIS — I10 Essential (primary) hypertension: Secondary | ICD-10-CM | POA: Insufficient documentation

## 2011-08-14 DIAGNOSIS — E7800 Pure hypercholesterolemia, unspecified: Secondary | ICD-10-CM | POA: Insufficient documentation

## 2011-08-14 DIAGNOSIS — R609 Edema, unspecified: Secondary | ICD-10-CM | POA: Insufficient documentation

## 2011-08-14 DIAGNOSIS — K219 Gastro-esophageal reflux disease without esophagitis: Secondary | ICD-10-CM | POA: Insufficient documentation

## 2011-08-14 DIAGNOSIS — F3289 Other specified depressive episodes: Secondary | ICD-10-CM | POA: Insufficient documentation

## 2011-08-14 DIAGNOSIS — R197 Diarrhea, unspecified: Secondary | ICD-10-CM | POA: Insufficient documentation

## 2011-08-14 DIAGNOSIS — F329 Major depressive disorder, single episode, unspecified: Secondary | ICD-10-CM | POA: Insufficient documentation

## 2011-08-14 DIAGNOSIS — Z9079 Acquired absence of other genital organ(s): Secondary | ICD-10-CM | POA: Insufficient documentation

## 2011-08-14 DIAGNOSIS — IMO0001 Reserved for inherently not codable concepts without codable children: Secondary | ICD-10-CM | POA: Insufficient documentation

## 2011-08-14 DIAGNOSIS — K589 Irritable bowel syndrome without diarrhea: Secondary | ICD-10-CM | POA: Insufficient documentation

## 2011-08-14 DIAGNOSIS — K529 Noninfective gastroenteritis and colitis, unspecified: Secondary | ICD-10-CM

## 2011-08-14 DIAGNOSIS — R5381 Other malaise: Secondary | ICD-10-CM | POA: Insufficient documentation

## 2011-08-14 LAB — COMPREHENSIVE METABOLIC PANEL
ALT: 14 U/L (ref 0–35)
AST: 22 U/L (ref 0–37)
Albumin: 3.5 g/dL (ref 3.5–5.2)
Alkaline Phosphatase: 98 U/L (ref 39–117)
BUN: 15 mg/dL (ref 6–23)
CO2: 26 mEq/L (ref 19–32)
Calcium: 10.1 mg/dL (ref 8.4–10.5)
Chloride: 102 mEq/L (ref 96–112)
Creatinine, Ser: 0.78 mg/dL (ref 0.50–1.10)
GFR calc Af Amer: >90 mL/min (ref >90)
GFR calc non Af Amer: 84 mL/min — ABNORMAL LOW (ref >90)
Glucose, Bld: 103 mg/dL — ABNORMAL HIGH (ref 70–99)
Potassium: 3.1 mEq/L — ABNORMAL LOW (ref 3.5–5.1)
Sodium: 138 mEq/L (ref 135–145)
Total Bilirubin: 0.3 mg/dL (ref 0.3–1.2)
Total Protein: 7.4 g/dL (ref 6.0–8.3)

## 2011-08-14 LAB — URINALYSIS, ROUTINE W REFLEX MICROSCOPIC
Bilirubin Urine: NEGATIVE
Glucose, UA: NEGATIVE mg/dL
Hgb urine dipstick: NEGATIVE
Leukocytes, UA: NEGATIVE
Nitrite: POSITIVE — AB
Protein, ur: NEGATIVE mg/dL
Specific Gravity, Urine: 1.025 (ref 1.005–1.030)
Urobilinogen, UA: 0.2 mg/dL (ref 0.0–1.0)
pH: 5.5 (ref 5.0–8.0)

## 2011-08-14 LAB — DIFFERENTIAL
Basophils Absolute: 0 10*3/uL (ref 0.0–0.1)
Basophils Relative: 0 % (ref 0–1)
Eosinophils Absolute: 0.2 10*3/uL (ref 0.0–0.7)
Eosinophils Relative: 3 % (ref 0–5)
Lymphocytes Relative: 37 % (ref 12–46)
Lymphs Abs: 2.2 10*3/uL (ref 0.7–4.0)
Monocytes Absolute: 0.7 10*3/uL (ref 0.1–1.0)
Monocytes Relative: 12 % (ref 3–12)
Neutro Abs: 2.9 10*3/uL (ref 1.7–7.7)
Neutrophils Relative %: 49 % (ref 43–77)

## 2011-08-14 LAB — URINE CULTURE
Colony Count: >=100000
Culture  Setup Time: 201301081855

## 2011-08-14 LAB — URINE MICROSCOPIC-ADD ON

## 2011-08-14 LAB — CBC
HCT: 32.5 % — ABNORMAL LOW (ref 36.0–46.0)
Hemoglobin: 11 g/dL — ABNORMAL LOW (ref 12.0–15.0)
MCH: 28.1 pg (ref 26.0–34.0)
MCHC: 33.8 g/dL (ref 30.0–36.0)
MCV: 82.9 fL (ref 78.0–100.0)
Platelets: 281 10*3/uL (ref 150–400)
RBC: 3.92 MIL/uL (ref 3.87–5.11)
RDW: 12.7 % (ref 11.5–15.5)
WBC: 6 10*3/uL (ref 4.0–10.5)

## 2011-08-14 LAB — LIPASE, BLOOD: Lipase: 38 U/L (ref 11–59)

## 2011-08-14 MED ORDER — CEPHALEXIN 500 MG PO CAPS: 500.0000 mg | ORAL_CAPSULE | Freq: Four times a day (QID) | ORAL | Status: AC

## 2011-08-14 MED ORDER — SODIUM CHLORIDE 0.9 % IV BOLUS (SEPSIS)
500.0000 mL | Freq: Once | INTRAVENOUS | Status: AC
  Administered 2011-08-14: 1000 mL via INTRAVENOUS

## 2011-08-14 MED ORDER — SODIUM CHLORIDE 0.9 % IV SOLN: INTRAVENOUS | Status: DC

## 2011-08-14 MED ORDER — ONDANSETRON HCL 4 MG PO TABS: 4.0000 mg | ORAL_TABLET | Freq: Three times a day (TID) | ORAL | Status: AC | PRN

## 2011-08-14 NOTE — ED Notes (Signed)
Diarrhea since Sept.  Vomited yesterday.  Feels weak.

## 2011-08-14 NOTE — Telephone Encounter (Signed)
Pt called- she is having a lot of vomiting and is feeling weak and lightheaded. Advised pt she needed to go to ED for fluids. Spoke with AS- pt needs to get fluids replaced, she is going to send rx to pharmacy for vomiting and she is going to call insurance company and see if she can get approval for entocort. Pt aware and going to ED for fluids

## 2011-08-14 NOTE — ED Provider Notes (Signed)
This chart was scribed for Flint Melter, MD by Williemae Natter. The patient was seen in room APA12/APA12 and the patient's care was started at 5:09 PM.  CSN: [id number]  Arrival date & time 08/14/11  1415   First MD Initiated Contact with Patient 08/14/11 1707      Chief Complaint  Patient presents with  . Diarrhea    (Consider location/radiation/quality/duration/timing/severity/associated sxs/prior treatment) HPI Marie [name] is a 71 y.o. female with a history of microcolitis, hypertension, and high cholesterol who presents to the Emergency Department complaining of watery green/yellow diarrhea since September. Pt denies any chest pain, fever, or back pain. Pt has acid reflux and stated that she has a stiff neck, nausea, vomiting, and some shoulder pain. She is treating with Pepto Bismol tablets with little improvement. She stated that the symptoms got worse after she had the flu 2 weeks ago. PCP Dr. Forest Gleason   Past Medical History  Diagnosis Date  . GERD (gastroesophageal reflux disease)   . Hypertension   . Hypercholesterolemia   . Collagenous colitis     colonoscopy 2009  . Fibromyalgia   . IBS (irritable bowel syndrome)   . Depression   . Lymphocytic colitis     colonoscopy 2007  . S/P endoscopy Sept 2008    small hh, s/p 72 French dilator    Past Surgical History  Procedure Date  . Abdominal hysterectomy   . Fistula of colon   . Nose surgery   . Foot surgery   . Knee surgery   . Colonoscopy 04/13/2008    anal papilla and internal hemorrhage otherwise normal /left side diverticula    Family History  Problem Relation Age of Onset  . Colon cancer Maternal Uncle     History  Substance Use Topics  . Smoking status: Never Smoker   . Smokeless tobacco: Not on file  . Alcohol Use: No    OB History    Grav Para Term Preterm Abortions TAB SAB Ect Mult Living                  Review of Systems 10 Systems reviewed and are negative for acute change except as  noted in the HPI.  Allergies  Review of patient's allergies indicates no known allergies.  Home Medications   Current Outpatient Rx  Name Route Sig Dispense Refill  . VITAMIN C PO Oral Take 1 tablet by mouth every morning.      Marland Kitchen BISMUTH SUBSALICYLATE 262 MG PO CHEW  as needed. Chew 3 tablets three times daily.     Marland Kitchen CALCIUM PO Oral Take 1 tablet by mouth every morning.      Marland Kitchen CITALOPRAM HYDROBROMIDE 20 MG PO TABS Oral Take 20 mg by mouth every morning.     Marland Kitchen DICYCLOMINE HCL 10 MG PO CAPS Oral Take 1 capsule (10 mg total) by mouth 2 (two) times daily. 60 capsule 1  . ESOMEPRAZOLE MAGNESIUM 40 MG PO CPDR Oral Take 40 mg by mouth daily before breakfast.      . HYDROCHLOROTHIAZIDE 12.5 MG PO CAPS Oral Take 12.5 mg by mouth every morning.     Marland Kitchen LOSARTAN POTASSIUM 50 MG PO TABS Oral Take 75 mg by mouth every morning.      . CENTRUM SILVER ULTRA WOMENS PO Oral Take 1 tablet by mouth every morning.      . OXYBUTYNIN CHLORIDE 5 MG PO TABS Oral Take 5 mg by mouth 2 (two) times daily.     Marland Kitchen  TRAMADOL HCL 50 MG PO TABS Oral Take 50 mg by mouth every 6 (six) hours as needed. For pain    . ONDANSETRON HCL 4 MG PO TABS Oral Take 1 tablet (4 mg total) by mouth every 8 (eight) hours as needed for nausea. 20 tablet 0    BP 128/75  Pulse 70  Temp(Src) 98 F (36.7 C) (Oral)  Resp 20  Wt 157 lb (71.215 kg)  SpO2 100%  Physical Exam  Nursing note and vitals reviewed. Constitutional: She is oriented to person, place, and time. She appears well-developed and well-nourished.  HENT:  Head: Normocephalic and atraumatic.  Mouth/Throat: Oropharynx is clear and moist.  Eyes: Conjunctivae and EOM are normal. Pupils are equal, round, and reactive to light.  Neck: Normal range of motion. Neck supple.  Cardiovascular: Normal rate, regular rhythm and normal heart sounds.   Pulmonary/Chest: Effort normal and breath sounds normal. No respiratory distress.  Abdominal: Soft. Bowel sounds are normal. There is  tenderness.       Mild diffuse tenderness No mass  Musculoskeletal: She exhibits edema (trace edema in both legs).  Neurological: She is alert and oriented to person, place, and time.  Skin: Skin is warm and dry.  Psychiatric: She has a normal mood and affect. Her behavior is normal. Judgment and thought content normal.    ED Course  Procedures (including critical care time) Emergency department treatment: IV fluids, bolus and drip. Urine sent for culture. 7:16 PM Recheck. Pt not feeling much better. She has received 1 L of normal saline IV. Repeat vital signs are reassuring. Has a UTI, all other tests negative, prescribing an antibiotic.  Labs Reviewed  CBC - Abnormal; Notable for the following:    Hemoglobin 11.0 (*)    HCT 32.5 (*)    All other components within normal limits  COMPREHENSIVE METABOLIC PANEL - Abnormal; Notable for the following:    Potassium 3.1 (*)    Glucose, Bld 103 (*)    GFR calc non Af Amer 84 (*)    All other components within normal limits  URINALYSIS, ROUTINE W REFLEX MICROSCOPIC - Abnormal; Notable for the following:    Ketones, ur TRACE (*)    Nitrite POSITIVE (*)    All other components within normal limits  URINE MICROSCOPIC-ADD ON - Abnormal; Notable for the following:    Bacteria, UA MANY (*)    All other components within normal limits  DIFFERENTIAL  LIPASE, BLOOD  URINE CULTURE     No diagnosis found.    MDM  Chronic diarrhea with secondary weakness. Incidental urinary tract infection discovered on evaluation, here today. Patient stable for discharge. Doubt systemic illness, serious bacterial illness or metabolic instability.     I personally performed the services described in this documentation, which was scribed in my presence. The recorded information has been reviewed and considered.     Flint Melter, MD 08/14/11 [phone]

## 2011-08-15 NOTE — Telephone Encounter (Signed)
[employer] company at [phone] and spoke to [place]. Generic for entocort will still cost pt 700.00 per month, as this is a tier 3 medication. As a tier 3, the patient must pay 48% that is not covered by insurance. I attempted to obtain a tier exception. A clinical pharmacist was brought on the line to review this. No tier exception available. She stated sulfasalazine would be a tier 1.  I then called [phone] to request a peer to peer review. Forms have now been filled out filing an appeal to this decision. Awaiting word.

## 2011-08-17 NOTE — ED Notes (Signed)
+   Urine Patient treated with keflex-sensitive to same-chart appended per protocol MD. 

## 2011-08-22 ENCOUNTER — Other Ambulatory Visit: Payer: Self-pay | Admitting: Gastroenterology

## 2011-08-22 NOTE — Progress Notes (Unsigned)
Pt never picked up lab orders or ifobt. Put in mail to pt.

## 2011-08-23 ENCOUNTER — Other Ambulatory Visit: Payer: Self-pay | Admitting: Gastroenterology

## 2011-08-23 MED ORDER — PREDNISONE 10 MG PO TABS: ORAL_TABLET | ORAL | Status: DC

## 2011-08-23 NOTE — Telephone Encounter (Signed)
Pt aware.

## 2011-08-23 NOTE — Telephone Encounter (Signed)
Appears Entocort approved. However, this will be $1000 dollars even after insurance coverage. Discussed with Dr. Jena Gauss. Will have to do Prednisone and taper over 4-6 weeks.   Prednisone 20 mg X 1 week Then 15 mg X 1 week Then 10 mg X 1 week Then 5 mg X 1 week, Then most likely will continue a low dose for a few weeks until we stop. Please have her complete the labs and ifobt. It is very important that she does this to rule out any other issues going on.   I have sent this prescription to the pharmacy. Let's make sure she goes and picks it up today.

## 2011-08-29 ENCOUNTER — Ambulatory Visit (INDEPENDENT_AMBULATORY_CARE_PROVIDER_SITE_OTHER): Payer: Medicare Other | Admitting: Gastroenterology

## 2011-08-29 ENCOUNTER — Encounter: Payer: Self-pay | Admitting: Gastroenterology

## 2011-08-29 ENCOUNTER — Ambulatory Visit: Payer: Medicare Other | Admitting: Gastroenterology

## 2011-08-29 VITALS — BP 142/63 | HR 68 | Temp 98.0°F | Ht 64.0 in | Wt 164.2 lb

## 2011-08-29 DIAGNOSIS — M359 Systemic involvement of connective tissue, unspecified: Secondary | ICD-10-CM

## 2011-08-29 DIAGNOSIS — R197 Diarrhea, unspecified: Secondary | ICD-10-CM

## 2011-08-29 DIAGNOSIS — D649 Anemia, unspecified: Secondary | ICD-10-CM

## 2011-08-29 LAB — IFOBT (OCCULT BLOOD): IFOBT: NEGATIVE

## 2011-08-29 MED ORDER — RESTORA PO CAPS: 1.0000 | ORAL_CAPSULE | Freq: Every day | ORAL | Status: DC

## 2011-08-29 NOTE — Progress Notes (Signed)
Quick Note:  Negative. Await further labs to include celiac panel, iron, ferritin, etc. ______

## 2011-08-29 NOTE — Patient Instructions (Signed)
Please have your blood work done and collect stool specimen. Please call on Monday if diarrhea not better. Continue prednisone taper as before.  You may stop peptobismol. You may take Bentyl up to three times a day for diarrhea. Start Restora (probiotics) one daily. Samples provided.

## 2011-08-29 NOTE — Assessment & Plan Note (Addendum)
Persistent diarrhea since 04/2011 with known h/o microscopic colitis. Last procedure in 2009. Failed Lialda, Pepto-Bismol. Now on low dose prednisone taper due to inability to obtain entocort for patient at a reasonable price. Basically no improvement on prednisone five days into therapy.  Stop pepto. Use bentyl up to tid. Add restora one daily. Collect ifobt. TTG, TSH. May end up with colonoscopy for further evaluation. ?trial of questran if all else doesn't work. Patient to call with PR on Monday.

## 2011-08-29 NOTE — Progress Notes (Signed)
Primary Care Physician: WEST,ANNE, MD, MD  Primary Gastroenterologist:  Michael Rourk, MD   Chief Complaint  Patient presents with  . Follow-up    HPI: Marie [name] is a 71 y.o. female here for f/u collangenous colitis. Sometimes diarrhea better. Still with stomach hurting/grumbling. Stools up to five daily. No normal BM since last 04/2011. No blood in stool, melena. Some heartburn really bad into throat at times. Cannot survive without Nexium. Week after Christmas went to ED, sorethroat and bad cough, told she had virus.  Back to ED 08/2011 for worse diarrhea, vomiting. Treated for UTI, gave antibiotics (Keflex). Diarrhea a little better while on Keflex. Taking Bentyl only once daily. Taking Pepto-Bismol six tablets daily. Just started Prednisone last Friday. We were never able to get tier exception for entocort and price for patient was going to be $700 per month.    Current Outpatient Prescriptions  Medication Sig Dispense Refill  . Ascorbic Acid (VITAMIN C PO) Take 1 tablet by mouth every morning.        . bismuth subsalicylate (PEPTO BISMOL) 262 MG chewable tablet as needed. Chew 3 tablets bid.       . CALCIUM PO Take 1 tablet by mouth every morning.        . citalopram (CELEXA) 20 MG tablet Take 20 mg by mouth every morning.       . dicyclomine (BENTYL) 10 MG capsule Take 10 mg by mouth daily.      . esomeprazole (NEXIUM) 40 MG capsule Take 40 mg by mouth daily before breakfast.        . hydrochlorothiazide (MICROZIDE) 12.5 MG capsule Take 12.5 mg by mouth every morning.       . losartan (COZAAR) 50 MG tablet Take 75 mg by mouth every morning.        . Multiple Vitamins-Minerals (CENTRUM SILVER ULTRA WOMENS PO) Take 1 tablet by mouth every morning.        . oxybutynin (DITROPAN) 5 MG tablet Take 5 mg by mouth 2 (two) times daily.       . predniSONE (DELTASONE) 10 MG tablet Take 2 tablets daily for one week, then 1.5 tablets daily for 1 week, then 1 tablet daily for 1 week, then half a  tablet daily for one week.  40 tablet  0  . traMADol (ULTRAM) 50 MG tablet Take 50 mg by mouth every 6 (six) hours as needed. For pain      .      0    Allergies as of 08/29/2011  . (No Known Allergies)    ROS:  General: Negative for anorexia, weight loss, fever, chills, fatigue, weakness. ENT: Negative for hoarseness, difficulty swallowing , nasal congestion. C/O acute sore throat. CV: Negative for chest pain, angina, palpitations, dyspnea on exertion, peripheral edema.  Respiratory: Negative for dyspnea at rest, dyspnea on exertion, cough, sputum, wheezing.  GI: See history of present illness. GU:  Negative for dysuria, hematuria, urinary incontinence, urinary frequency, nocturnal urination.  Endo: Negative for unusual weight change.    Physical Examination:   BP 142/63  Pulse 68  Temp(Src) 98 F (36.7 C) (Temporal)  Ht 5' 4" (1.626 m)  Wt 164 lb 3.2 oz (74.481 kg)  BMI 28.19 kg/m2  General: Well-nourished, well-developed in no acute distress.  Eyes: No icterus. Mouth: Oropharyngeal mucosa moist and pink , no lesions erythema or exudate. Lungs: Clear to auscultation bilaterally.  Heart: Regular rate and rhythm, no murmurs rubs or gallops.  Abdomen:   Bowel sounds are normal, nontender, nondistended, no hepatosplenomegaly or masses, no abdominal bruits or hernia , no rebound or guarding.   Extremities: No lower extremity edema. No clubbing or deformities. Neuro: Alert and oriented x 4   Skin: Warm and dry, no jaundice.   Psych: Alert and cooperative, normal mood and affect.  Labs:  Lab Results  Component Value Date   WBC 6.0 08/14/2011   HGB 11.0* 08/14/2011   HCT 32.5* 08/14/2011   MCV 82.9 08/14/2011   PLT 281 08/14/2011   Lab Results  Component Value Date   CREATININE 0.78 08/14/2011   BUN 15 08/14/2011   NA 138 08/14/2011   K 3.1* 08/14/2011   CL 102 08/14/2011   CO2 26 08/14/2011   Lab Results  Component Value Date   ALT 14 08/14/2011   AST 22 08/14/2011   ALKPHOS 98 08/14/2011     BILITOT 0.3 08/14/2011      Imaging Studies: Dg Chest 2 View  08/06/2011  *RADIOLOGY REPORT*  Clinical Data: Cough, fever  CHEST - 2 VIEW  Comparison: None.  Findings: Lungs are clear. No pleural effusion or pneumothorax.  Cardiomediastinal silhouette is within normal limits.  Mild degenerative changes of the visualized thoracolumbar spine.  IMPRESSION: No evidence of acute cardiopulmonary disease.  Original Report Authenticated By: SRIYESH KRISHNAN, M.D.        

## 2011-08-29 NOTE — Assessment & Plan Note (Signed)
Iron, tibc, ferritin, TTG. Ifobt.

## 2011-08-29 NOTE — Progress Notes (Signed)
Faxed to PCP

## 2011-08-30 LAB — IRON AND TIBC
%SAT: 21 % (ref 20–55)
Iron: 77 ug/dL (ref 42–145)
TIBC: 360 ug/dL (ref 250–470)
UIBC: 283 ug/dL (ref 125–400)

## 2011-08-30 LAB — TISSUE TRANSGLUTAMINASE, IGA: Tissue Transglutaminase Ab, IgA: 3.5 U/mL (ref <20)

## 2011-08-30 LAB — FERRITIN: Ferritin: 23 ng/mL (ref 10–291)

## 2011-08-30 LAB — TSH: TSH: 2.753 u[IU]/mL (ref 0.350–4.500)

## 2011-08-30 NOTE — Progress Notes (Signed)
Quick Note:  Tried to call pt- NA ______ 

## 2011-09-03 NOTE — Progress Notes (Signed)
Quick Note:  Celiac screen negative. Ferritin and fe sat% low normal. Need her to return ifobt so we can decide about tcs.  ______

## 2011-09-05 ENCOUNTER — Telehealth: Payer: Self-pay | Admitting: Gastroenterology

## 2011-09-05 NOTE — Telephone Encounter (Signed)
Tried to call pt- LMOM 

## 2011-09-05 NOTE — Telephone Encounter (Signed)
Patient is having diarrhea still please advise??

## 2011-09-05 NOTE — Telephone Encounter (Signed)
Please let pt know. I will discuss with RMR, she may need TCS.   How many stools daily?

## 2011-09-06 NOTE — Telephone Encounter (Signed)
Spoke with pt- she is having diarrhea 3x a day, she feels like it is getting better now but she said her stomach hurts and she has a bad taste in her mouth and feels like there is something in her throat. She said her nose is stopped up and she is not sure if its coming from the sinus issues or her stomach. She is requesting to go ahead and have the tcs done. Please advise.

## 2011-09-06 NOTE — Telephone Encounter (Signed)
Actually I just talked to RMR about her. She has slightly low h/h. Iron sat and ferritin are low normal. He agreed with need to do colonoscopy for refractory diarrhea, anemia with trend to ida.  Go ahead and schedule tcs with rmr.

## 2011-09-07 ENCOUNTER — Other Ambulatory Visit: Payer: Self-pay | Admitting: Gastroenterology

## 2011-09-07 DIAGNOSIS — R197 Diarrhea, unspecified: Secondary | ICD-10-CM

## 2011-09-07 DIAGNOSIS — D509 Iron deficiency anemia, unspecified: Secondary | ICD-10-CM

## 2011-09-07 MED ORDER — PEG-KCL-NACL-NASULF-NA ASC-C 100 G PO SOLR: 1.0000 | Freq: Once | ORAL | Status: DC

## 2011-09-07 NOTE — Telephone Encounter (Signed)
TCS scheduled for 02/14-instructions mailed

## 2011-09-07 NOTE — Telephone Encounter (Signed)
Crystal please schedule. 

## 2011-09-19 MED ORDER — SODIUM CHLORIDE 0.45 % IV SOLN
Freq: Once | INTRAVENOUS | Status: AC
  Administered 2011-09-20: 11:00:00 via INTRAVENOUS

## 2011-09-20 ENCOUNTER — Encounter (HOSPITAL_COMMUNITY): Payer: Self-pay | Admitting: *Deleted

## 2011-09-20 ENCOUNTER — Ambulatory Visit (HOSPITAL_COMMUNITY)
Admission: RE | Admit: 2011-09-20 | Discharge: 2011-09-20 | Disposition: A | Discharge status: Home / Self Care | Payer: Medicare Other | Source: Ambulatory Visit | Attending: Internal Medicine | Admitting: Internal Medicine

## 2011-09-20 ENCOUNTER — Encounter (HOSPITAL_COMMUNITY)
Admission: RE | Disposition: A | Discharge status: Home / Self Care | Payer: Self-pay | Source: Ambulatory Visit | Attending: Internal Medicine

## 2011-09-20 ENCOUNTER — Other Ambulatory Visit: Payer: Self-pay | Admitting: Internal Medicine

## 2011-09-20 DIAGNOSIS — K5730 Diverticulosis of large intestine without perforation or abscess without bleeding: Secondary | ICD-10-CM | POA: Insufficient documentation

## 2011-09-20 DIAGNOSIS — Z8719 Personal history of other diseases of the digestive system: Secondary | ICD-10-CM | POA: Insufficient documentation

## 2011-09-20 DIAGNOSIS — K5289 Other specified noninfective gastroenteritis and colitis: Secondary | ICD-10-CM | POA: Insufficient documentation

## 2011-09-20 DIAGNOSIS — R197 Diarrhea, unspecified: Secondary | ICD-10-CM | POA: Insufficient documentation

## 2011-09-20 DIAGNOSIS — D509 Iron deficiency anemia, unspecified: Secondary | ICD-10-CM

## 2011-09-20 DIAGNOSIS — K648 Other hemorrhoids: Secondary | ICD-10-CM | POA: Insufficient documentation

## 2011-09-20 HISTORY — PX: COLONOSCOPY: SHX5424

## 2011-09-20 SURGERY — COLONOSCOPY
Anesthesia: Moderate Sedation

## 2011-09-20 MED ORDER — MIDAZOLAM HCL 5 MG/5ML IJ SOLN
INTRAMUSCULAR | Status: DC | PRN
  Administered 2011-09-20 (×2): 2 mg via INTRAVENOUS

## 2011-09-20 MED ORDER — MIDAZOLAM HCL 5 MG/5ML IJ SOLN
INTRAMUSCULAR | Status: AC
  Filled 2011-09-20: qty 10

## 2011-09-20 MED ORDER — MEPERIDINE HCL 100 MG/ML IJ SOLN
INTRAMUSCULAR | Status: AC
  Filled 2011-09-20: qty 2

## 2011-09-20 MED ORDER — MEPERIDINE HCL 100 MG/ML IJ SOLN
INTRAMUSCULAR | Status: DC | PRN
  Administered 2011-09-20: 25 mg via INTRAVENOUS
  Administered 2011-09-20: 50 mg via INTRAVENOUS

## 2011-09-20 MED ORDER — STERILE WATER FOR IRRIGATION IR SOLN
Status: DC | PRN
  Administered 2011-09-20: 11:00:00

## 2011-09-20 NOTE — H&P (View-Only) (Signed)
Primary Care Physician: Forest Gleason, MD, MD  Primary Gastroenterologist:  Roetta Sessions, MD   Chief Complaint  Patient presents with  . Follow-up    HPI: Marie [name] is a 71 y.o. female here for f/u collangenous colitis. Sometimes diarrhea better. Still with stomach hurting/grumbling. Stools up to five daily. No normal BM since last 04/2011. No blood in stool, melena. Some heartburn really bad into throat at times. Cannot survive without Nexium. Week after Christmas went to ED, sorethroat and bad cough, told she had virus.  Back to ED 08/2011 for worse diarrhea, vomiting. Treated for UTI, gave antibiotics (Keflex). Diarrhea a little better while on Keflex. Taking Bentyl only once daily. Taking Pepto-Bismol six tablets daily. Just started Prednisone last Friday. We were never able to get tier exception for entocort and price for patient was going to be $700 per month.    Current Outpatient Prescriptions  Medication Sig Dispense Refill  . Ascorbic Acid (VITAMIN C PO) Take 1 tablet by mouth every morning.        . bismuth subsalicylate (PEPTO BISMOL) 262 MG chewable tablet as needed. Chew 3 tablets bid.       Marland Kitchen CALCIUM PO Take 1 tablet by mouth every morning.        . citalopram (CELEXA) 20 MG tablet Take 20 mg by mouth every morning.       . dicyclomine (BENTYL) 10 MG capsule Take 10 mg by mouth daily.      Marland Kitchen esomeprazole (NEXIUM) 40 MG capsule Take 40 mg by mouth daily before breakfast.        . hydrochlorothiazide (MICROZIDE) 12.5 MG capsule Take 12.5 mg by mouth every morning.       Marland Kitchen losartan (COZAAR) 50 MG tablet Take 75 mg by mouth every morning.        . Multiple Vitamins-Minerals (CENTRUM SILVER ULTRA WOMENS PO) Take 1 tablet by mouth every morning.        Marland Kitchen oxybutynin (DITROPAN) 5 MG tablet Take 5 mg by mouth 2 (two) times daily.       . predniSONE (DELTASONE) 10 MG tablet Take 2 tablets daily for one week, then 1.5 tablets daily for 1 week, then 1 tablet daily for 1 week, then half a  tablet daily for one week.  40 tablet  0  . traMADol (ULTRAM) 50 MG tablet Take 50 mg by mouth every 6 (six) hours as needed. For pain      .      0    Allergies as of 08/29/2011  . (No Known Allergies)    ROS:  General: Negative for anorexia, weight loss, fever, chills, fatigue, weakness. ENT: Negative for hoarseness, difficulty swallowing , nasal congestion. C/O acute sore throat. CV: Negative for chest pain, angina, palpitations, dyspnea on exertion, peripheral edema.  Respiratory: Negative for dyspnea at rest, dyspnea on exertion, cough, sputum, wheezing.  GI: See history of present illness. GU:  Negative for dysuria, hematuria, urinary incontinence, urinary frequency, nocturnal urination.  Endo: Negative for unusual weight change.    Physical Examination:   BP 142/63  Pulse 68  Temp(Src) 98 F (36.7 C) (Temporal)  Ht 5\' 4"  (1.626 m)  Wt 164 lb 3.2 oz (74.481 kg)  BMI 28.19 kg/m2  General: Well-nourished, well-developed in no acute distress.  Eyes: No icterus. Mouth: Oropharyngeal mucosa moist and pink , no lesions erythema or exudate. Lungs: Clear to auscultation bilaterally.  Heart: Regular rate and rhythm, no murmurs rubs or gallops.  Abdomen:  Bowel sounds are normal, nontender, nondistended, no hepatosplenomegaly or masses, no abdominal bruits or hernia , no rebound or guarding.   Extremities: No lower extremity edema. No clubbing or deformities. Neuro: Alert and oriented x 4   Skin: Warm and dry, no jaundice.   Psych: Alert and cooperative, normal mood and affect.  Labs:  Lab Results  Component Value Date   WBC 6.0 08/14/2011   HGB 11.0* 08/14/2011   HCT 32.5* 08/14/2011   MCV 82.9 08/14/2011   PLT 281 08/14/2011   Lab Results  Component Value Date   CREATININE 0.78 08/14/2011   BUN 15 08/14/2011   NA 138 08/14/2011   K 3.1* 08/14/2011   CL 102 08/14/2011   CO2 26 08/14/2011   Lab Results  Component Value Date   ALT 14 08/14/2011   AST 22 08/14/2011   ALKPHOS 98 08/14/2011     BILITOT 0.3 08/14/2011      Imaging Studies: Dg Chest 2 View  08/06/2011  *RADIOLOGY REPORT*  Clinical Data: Cough, fever  CHEST - 2 VIEW  Comparison: None.  Findings: Lungs are clear. No pleural effusion or pneumothorax.  Cardiomediastinal silhouette is within normal limits.  Mild degenerative changes of the visualized thoracolumbar spine.  IMPRESSION: No evidence of acute cardiopulmonary disease.  Original Report Authenticated By: Charline Bills, M.D.

## 2011-09-20 NOTE — Op Note (Signed)
[facility] [address] [place], [place]  [address]  COLONOSCOPY PROCEDURE REPORT  PATIENT:  Marie [name], Marie [name]  MR#:  [id number] BIRTHDATE:  08-02-1941, 70 yrs. old  GENDER:  female ENDOSCOPIST:  R. Roetta Sessions, MD FACP [facility] REF. BY:          Dr. Casilda Carls PROCEDURE DATE:  09/20/2011 PROCEDURE:  Ileo- colonoscopy with biopsy  INDICATIONS:  History of microscopic colitis and refractory diarrhea (recent marked improvement in symptoms with a course of low-dose prednisone)  INFORMED CONSENT:  The risks, benefits, alternatives and imponderables including but not limited to bleeding, perforation as well as the possibility of a missed lesion have been reviewed. The potential for biopsy, lesion removal, etc. have also been discussed.  Questions have been answered.  All parties agreeable. Please see the history and physical in the medical record for more information.  MEDICATIONS:  Versed 4 mg IV and Demerol 75 mg IV in divided doses.  DESCRIPTION OF PROCEDURE:  After a digital rectal exam was performed, the EC-[id number] ([id number]) colonoscope was advanced from the anus through the rectum and colon to the area of the cecum, ileocecal valve and appendiceal orifice.  The cecum was deeply intubated.  These structures were well-seen and photographed for the record.  From the level of the cecum and ileocecal valve, the scope was slowly and cautiously withdrawn.  The mucosal surfaces were carefully surveyed utilizing scope tip deflection to facilitate fold flattening as needed.  The scope was pulled down into the rectum where a thorough examination including retroflexion was performed. <<PROCEDUREIMAGES>>  FINDINGS: Adequate preparation. Internal hemorrhoids; otherwise, normal rectum. Left-sided diverticula; remainder colonic mucosa appeared normal. Distal 10 cm of terminal ileal mucosa appeared normal.  THERAPEUTIC / DIAGNOSTIC MANEUVERS PERFORMED:  Biopsies of the descending and  sigmoid segments were taken.  COMPLICATIONS:  None  CECAL WITHDRAWAL TIME: 8 minutes  IMPRESSION:    Colonic diverticulosis and internal hemorrhoids -- status post colonic biopsy  RECOMMENDATIONS:    Decrease prednisone to 4 mg daily x2 weeks; then decrease to 3 mg daily utilizing the 1 mg prednisone tablet. Hold at 3 mg daily until this nice lady is seen back in the office 2 months from now and go from there.  ______________________________ R. Roetta Sessions, MD Caleen Essex  CC:  n. eSIGNED:   R. Roetta Sessions at 09/20/2011 11:20 AM  Tonita Cong, [phone]

## 2011-09-20 NOTE — Interval H&P Note (Signed)
History and Physical Interval Note:  09/20/2011 10:51 AM  Marie [name]  has presented today for surgery, with the diagnosis of diarrhea and IDA  The various methods of treatment have been discussed with the patient and family. After consideration of risks, benefits and other options for treatment, the patient has consented to  Procedure(s) (LRB): COLONOSCOPY (N/A) as a surgical intervention .  The patients' history has been reviewed, patient examined, no change in status, stable for surgery.  I have reviewed the patients' chart and labs.  Questions were answered to the patient's satisfaction.     [name]

## 2011-09-20 NOTE — Discharge Instructions (Signed)
Colonoscopy Discharge Instructions  Read the instructions outlined below and refer to this sheet in the next few weeks. These discharge instructions provide you with general information on caring for yourself after you leave the hospital. Your doctor may also give you specific instructions. While your treatment has been planned according to the most current medical practices available, unavoidable complications occasionally occur. If you have any problems or questions after discharge, call Dr. Jena Gauss at [phone]. ACTIVITY  You may resume your regular activity, but move at a slower pace for the next 24 hours.   Take frequent rest periods for the next 24 hours.   Walking will help get rid of the air and reduce the bloated feeling in your belly (abdomen).   No driving for 24 hours (because of the medicine (anesthesia) used during the test).    Do not sign any important legal documents or operate any machinery for 24 hours (because of the anesthesia used during the test).  NUTRITION  Drink plenty of fluids.   You may resume your normal diet as instructed by your doctor.   Begin with a light meal and progress to your normal diet. Heavy or fried foods are harder to digest and may make you feel sick to your stomach (nauseated).   Avoid alcoholic beverages for 24 hours or as instructed.  MEDICATIONS  You may resume your normal medications unless your doctor tells you otherwise.  WHAT YOU CAN EXPECT TODAY  Some feelings of bloating in the abdomen.   Passage of more gas than usual.   Spotting of blood in your stool or on the toilet paper.  IF YOU HAD POLYPS REMOVED DURING THE COLONOSCOPY:  No aspirin products for 7 days or as instructed.   No alcohol for 7 days or as instructed.   Eat a soft diet for the next 24 hours.  FINDING OUT THE RESULTS OF YOUR TEST Not all test results are available during your visit. If your test results are not back during the visit, make an appointment  with your caregiver to find out the results. Do not assume everything is normal if you have not heard from your caregiver or the medical facility. It is important for you to follow up on all of your test results.  SEEK IMMEDIATE MEDICAL ATTENTION IF:  You have more than a spotting of blood in your stool.   Your belly is swollen (abdominal distention).   You are nauseated or vomiting.   You have a temperature over 101.   You have abdominal pain or discomfort that is severe or gets worse throughout the day.     Diverticulosis information provided.  Decrease prednisone to 4 mg daily x2 weeks; then decrease prednisone to 3 mg daily and stay at that dose until seen back in the office.  Office visit with [place] in 2 months.  April 15 at 10:30  Diverticulitis A diverticulum is a small pouch or sac on the colon. Diverticulosis is the presence of these diverticula on the colon. Diverticulitis is the irritation (inflammation) or infection of diverticula. CAUSES  The colon and its diverticula contain bacteria. If food particles block the tiny opening to a diverticulum, the bacteria inside can grow and cause an increase in pressure. This leads to infection and inflammation and is called diverticulitis. SYMPTOMS   Abdominal pain and tenderness. Usually, the pain is located on the left side of your abdomen. However, it could be located elsewhere.   Fever.   Bloating.   Feeling  sick to your stomach (nausea).   Throwing up (vomiting).   Abnormal stools.  DIAGNOSIS  Your caregiver will take a history and perform a physical exam. Since many things can cause abdominal pain, other tests may be necessary. Tests may include:  Blood tests.   Urine tests.   X-ray of the abdomen.   CT scan of the abdomen.  Sometimes, surgery is needed to determine if diverticulitis or other conditions are causing your symptoms. TREATMENT  Most of the time, you can be treated without surgery. Treatment  includes:  Resting the bowels by only having liquids for a few days. As you improve, you will need to eat a low-fiber diet.   Intravenous (IV) fluids if you are losing body fluids (dehydrated).   Antibiotic medicines that treat infections may be given.   Pain and nausea medicine, if needed.   Surgery if the inflamed diverticulum has burst.  HOME CARE INSTRUCTIONS   Try a clear liquid diet (broth, tea, or water for as long as directed by your caregiver). You may then gradually begin a low-fiber diet as tolerated. A low-fiber diet is a diet with less than 10 grams of fiber. Choose the foods below to reduce fiber in the diet:   White breads, cereals, rice, and pasta.   Cooked fruits and vegetables or soft fresh fruits and vegetables without the skin.   Ground or well-cooked tender beef, ham, veal, lamb, pork, or poultry.   Eggs and seafood.   After your diverticulitis symptoms have improved, your caregiver may put you on a high-fiber diet. A high-fiber diet includes 14 grams of fiber for every 1000 calories consumed. For a standard 2000 calorie diet, you would need 28 grams of fiber. Follow these diet guidelines to help you increase the fiber in your diet. It is important to slowly increase the amount fiber in your diet to avoid gas, constipation, and bloating.   Choose whole-grain breads, cereals, pasta, and brown rice.   Choose fresh fruits and vegetables with the skin on. Do not overcook vegetables because the more vegetables are cooked, the more fiber is lost.   Choose more nuts, seeds, legumes, dried peas, beans, and lentils.   Look for food products that have greater than 3 grams of fiber per serving on the Nutrition Facts label.   Take all medicine as directed by your caregiver.   If your caregiver has given you a follow-up appointment, it is very important that you go. Not going could result in lasting (chronic) or permanent injury, pain, and disability. If there is any  problem keeping the appointment, call to reschedule.  SEEK MEDICAL CARE IF:   Your pain does not improve.   You have a hard time advancing your diet beyond clear liquids.   Your bowel movements do not return to normal.  SEEK IMMEDIATE MEDICAL CARE IF:   Your pain becomes worse.   You have an oral temperature above 102 F (38.9 C), not controlled by medicine.   You have repeated vomiting.   You have bloody or black, tarry stools.   Symptoms that brought you to your caregiver become worse or are not getting better.  MAKE SURE YOU:   Understand these instructions.   Will watch your condition.   Will get help right away if you are not doing well or get worse.  Document Released: 05/02/2005 Document Revised: 04/04/2011 Document Reviewed: 08/28/2010 [facility] Patient Information 2012 [place], [place].

## 2011-09-26 ENCOUNTER — Encounter: Payer: Self-pay | Admitting: Internal Medicine

## 2011-09-27 ENCOUNTER — Encounter (HOSPITAL_COMMUNITY): Payer: Self-pay | Admitting: Internal Medicine

## 2011-10-17 ENCOUNTER — Telehealth: Payer: Self-pay | Admitting: Internal Medicine

## 2011-10-17 NOTE — Telephone Encounter (Signed)
Pt had 5 loose stools yesterday, 4 loose stools today. She said that she was unable to control it and sometimes wasn't making it to the bathroom on time. She is on a taper, last week she was taking 4 a day, this week she is on 3 a day.

## 2011-10-17 NOTE — Telephone Encounter (Signed)
Patient is having frequent diarrhea and she is asking if we can increase the dose of prednisone please advise??

## 2011-10-17 NOTE — Telephone Encounter (Signed)
Please call pt and find out how many loose stools she is having and what dose of prednisone she is currently taking. I believe she is on a taper.

## 2011-10-18 MED ORDER — CHOLESTYRAMINE 4 G PO PACK: 0.5000 | PACK | Freq: Every day | ORAL | Status: DC

## 2011-10-18 NOTE — Telephone Encounter (Signed)
We are going to use off-label Questran to help bulk up her stools. She only needs to take 1/2 packet per day. We can adjust this if needed.   IMPORTANT: if taking in morning, wait at least one hour after her morning medications to take this new one. Will affect absorption. If she takes this new one first, can't have other meds until 4-6 hours later. So, best to take one hour after morning meds are done.

## 2011-10-18 NOTE — Telephone Encounter (Signed)
I sent to pharmacy.

## 2011-10-18 NOTE — Telephone Encounter (Signed)
Tried to call pt- LMOM 

## 2011-10-18 NOTE — Telephone Encounter (Signed)
Addended by: Nira Retort on: 10/18/2011 01:40 PM   Modules accepted: Orders

## 2011-10-18 NOTE — Telephone Encounter (Signed)
Tried to call pt- LM with female for return call. 

## 2011-10-19 NOTE — Telephone Encounter (Signed)
Pt aware.

## 2011-10-29 ENCOUNTER — Telehealth: Payer: Self-pay | Admitting: Gastroenterology

## 2011-10-29 NOTE — Telephone Encounter (Signed)
LMOM to call in the morning

## 2011-10-29 NOTE — Telephone Encounter (Signed)
She is having 4-5 stools a day.

## 2011-10-29 NOTE — Telephone Encounter (Signed)
Let's have her take 1/2 packet in am and 1/2 packet in pm, see how it does. She is only on 1/2 packet now. She should be having an OV soon.

## 2011-10-29 NOTE — Telephone Encounter (Signed)
Pt called, her medication for diarrhea is not working- she would like something else called in to [facility]- She did say that the prednisone she was on before did help- She can be reached at [phone] or [phone]

## 2011-10-29 NOTE — Telephone Encounter (Signed)
Started Questran recently. Can we find out how many loose stools she is still having? We may need to titrate the dose up.

## 2011-11-16 ENCOUNTER — Encounter: Payer: Self-pay | Admitting: Internal Medicine

## 2011-11-19 ENCOUNTER — Ambulatory Visit (INDEPENDENT_AMBULATORY_CARE_PROVIDER_SITE_OTHER): Payer: Medicare Other | Admitting: Gastroenterology

## 2011-11-19 ENCOUNTER — Encounter: Payer: Self-pay | Admitting: Gastroenterology

## 2011-11-19 VITALS — BP 140/74 | HR 71 | Temp 98.7°F | Ht 62.0 in | Wt 162.8 lb

## 2011-11-19 DIAGNOSIS — K52839 Microscopic colitis, unspecified: Secondary | ICD-10-CM

## 2011-11-19 DIAGNOSIS — R1012 Left upper quadrant pain: Secondary | ICD-10-CM

## 2011-11-19 DIAGNOSIS — K219 Gastro-esophageal reflux disease without esophagitis: Secondary | ICD-10-CM

## 2011-11-19 DIAGNOSIS — K5289 Other specified noninfective gastroenteritis and colitis: Secondary | ICD-10-CM

## 2011-11-19 MED ORDER — ESOMEPRAZOLE MAGNESIUM 40 MG PO CPDR: 40.0000 mg | DELAYED_RELEASE_CAPSULE | Freq: Two times a day (BID) | ORAL | Status: DC

## 2011-11-19 NOTE — Patient Instructions (Signed)
Please review the lactose free diet and the reflux diet. Let's see how you do staying away from any products that have dairy in them. This may or may not help.   Increase the Nexium to twice a day, 30 minutes before breakfast and dinner.   We will see you back in 4 weeks. Please contact us if you have any worsening of your reflux or abdominal discomfort.

## 2011-11-19 NOTE — Progress Notes (Signed)
Referring Provider: Forest Gleason, MD Primary Care Physician:  Denita Lung, DO, DO Primary Gastroenterologist: Dr. Jena Gauss   Chief Complaint  Patient presents with  . Follow-up    HPI:   Presents today in f/u after TCS. Hx of microscopic colitis. Issues with empirical treatment several months ago due to insurance costs. Entocort too expensive, no response to Pepto, started Prednisone with improved response. TCS recently performed due to mild anemia, low normal iron and ferritin. CELIAC NEGATIVE.  Last wt 164 in Jan, now 162. Called in after TCS with 4-5 loose stools per day. Started on Questran 1/2 packet off-label. Increased to BID recently.  Returns today stating she is better. 2 or 3 loose stools per day. States not taking Questran. Taking metamucil at night, probiotic. A couple times has had to take Nexium BID. Last EGD 2008. Sometimes afraid to eat because of diarrhea, a lot of times just doesn't want it. States sometimes skips meals. Last week had GERD all day long. Never knows what is going to hit wrong. Avoids greasy or spicy foods. No dysphagia. Sometimes will hurt in LUQ after eating. Not "all the time". Feels achy. If eats too much will be uncomfortable, or if haven't had a good BM. No NSAIDs, no aspirin powders. No melena.    Needs knee replacement soon.   Vitals - 1 value per visit Weight (lb)  11/19/2011 162.8  09/20/2011 164  09/07/2011   08/29/2011 164.2  08/14/2011 157  08/06/2011 164  07/26/2011 166.4  06/07/2011 166  04/26/2011 169.4      . GERD (gastroesophageal reflux disease)   . Hypertension   . Hypercholesterolemia   . Collagenous colitis     colonoscopy 2009  . Fibromyalgia   . IBS (irritable bowel syndrome)   . Depression   . Lymphocytic colitis     colonoscopy 2007, TCS 2013  . S/P endoscopy Sept 2008    small hh, s/p 75 French dilator  . Anemia     Past Surgical History  Procedure Date  . Abdominal hysterectomy   . Fistula of colon   . Nose surgery     . Foot surgery   . Knee surgery   . Colonoscopy 04/13/2008    anal papilla and internal hemorrhage otherwise normal /left side diverticula  . Colonoscopy 09/20/2011    Adequate preparation. Internal hemorrhoids; otherwise normal/ Left-sided diverticula, biopsies positive for lymphocytic colitis    Current Outpatient Prescriptions  Medication Sig Dispense Refill  . CALCIUM PO Take 1 tablet by mouth every morning.        . Cholecalciferol (VITAMIN D3) 1000 UNITS CAPS Take 1,000 Units by mouth daily.      . cholestyramine (QUESTRAN) 4 G packet Take 0.5 packets by mouth daily. Do not take with other medications. Wait at least one hour after other medications to take Questran.  30 each  1  . citalopram (CELEXA) 20 MG tablet Take 20 mg by mouth every morning.       Marland Kitchen esomeprazole (NEXIUM) 40 MG capsule Take 40 mg by mouth daily before breakfast.        . hydrochlorothiazide (MICROZIDE) 12.5 MG capsule Take 12.5 mg by mouth every morning.       Marland Kitchen losartan (COZAAR) 50 MG tablet Take 75 mg by mouth every morning.        . Multiple Vitamins-Minerals (CENTRUM SILVER ULTRA WOMENS PO) Take 1 tablet by mouth every morning.        Marland Kitchen oxybutynin (DITROPAN) 5 MG  tablet Take 5 mg by mouth 2 (two) times daily.       . Probiotic Product (RESTORA) CAPS Take 1 capsule by mouth daily.  30 capsule  0  . traMADol (ULTRAM) 50 MG tablet Take 50 mg by mouth every 6 (six) hours as needed. For pain      . vitamin B-12 (CYANOCOBALAMIN) 1000 MCG tablet Take 1,000 mcg by mouth daily.      . Ascorbic Acid (VITAMIN C PO) Take 1 tablet by mouth every morning.        . dicyclomine (BENTYL) 10 MG capsule Take 1 capsule (10 mg total) by mouth 4 (four) times daily -  before meals and at bedtime.      . peg 3350 powder (MOVIPREP) 100 G SOLR Take 1 kit (100 g total) by mouth once. As directed Please purchase 1 Fleets enema to use with the prep  1 kit  0  . predniSONE (DELTASONE) 10 MG tablet Take 2 tablets daily for one week, then  1.5 tablets daily for 1 week, then 1 tablet daily for 1 week, then half a tablet daily for one week.  40 tablet  0    Allergies as of 11/19/2011  . (No Known Allergies)    Family History  Problem Relation Age of Onset  . Colon cancer Maternal Uncle     History   Social History  . Marital Status: Widowed    Spouse Name: N/A    Number of Children: N/A  . Years of Education: N/A   Social History Main Topics  . Smoking status: Never Smoker   . Smokeless tobacco: None  . Alcohol Use: No  . Drug Use: No  . Sexually Active: None   Other Topics Concern  . None   Social History Narrative  . None    Review of Systems: Gen: SEE HPI CV: Denies chest pain, palpitations, syncope, peripheral edema, and claudication. Resp: Denies dyspnea at rest, cough, wheezing, coughing up blood, and pleurisy. GI: Denies vomiting blood, jaundice, and fecal incontinence.   Denies dysphagia or odynophagia. Derm: Denies rash, itching, dry skin Psych: Denies depression, anxiety, memory loss, confusion. No homicidal or suicidal ideation.  Heme: Denies bruising, bleeding, and enlarged lymph nodes.  Physical Exam: BP 140/74  Pulse 71  Temp(Src) 98.7 F (37.1 C) (Temporal)  Ht 5\' 2"  (1.575 m)  Wt 162 lb 12.8 oz (73.846 kg)  BMI 29.78 kg/m2 General:   Alert and oriented. No distress noted. Pleasant and cooperative.  Head:  Normocephalic and atraumatic. Eyes:  Conjuctiva clear without scleral icterus. Neck:  Supple, without mass or thyromegaly. Heart:  S1, S2 present without murmurs, rubs, or gallops. Regular rate and rhythm. Abdomen:  +BS, soft,  non-distended. States "sore" to palpation LUQ. No rebound or guarding. No HSM or masses noted. Msk:  Symmetrical without gross deformities. Normal posture. Extremities:  Without edema. Neurologic:  Alert and  oriented x4;  grossly normal neurologically. Skin:  Intact without significant lesions or rashes. Psych:  Alert and cooperative. Normal mood and  affect.

## 2011-11-21 DIAGNOSIS — R1012 Left upper quadrant pain: Secondary | ICD-10-CM | POA: Insufficient documentation

## 2011-11-21 DIAGNOSIS — K52832 Lymphocytic colitis: Secondary | ICD-10-CM | POA: Insufficient documentation

## 2011-11-21 NOTE — Assessment & Plan Note (Signed)
Lymphocytic colitis noted on recent TCS Feb 2013. Finished Prednisone taper. Difficulty with prior meds (Pepto, Lialda, Entocort too expensive). Started on Questran, pt no longer taking. Diarrhea greatly improved. On fiber, probiotic. Doing well. Likes dairy products. Celiac negative.  Trial of lactose-free diet Continue current regimen of fiber and probiotics 4 week f/u

## 2011-11-21 NOTE — Assessment & Plan Note (Signed)
Follow reflux diet. Increase Nexium to BID, return in 4 weeks. May need EGD if continued decreased appetite, refractory reflux, LUQ discomfort.

## 2011-11-21 NOTE — Assessment & Plan Note (Signed)
Vague reports of LUQ discomfort, decreased appetite, intermittent GERD despite PPI. States sometimes LUQ discomfort if constipated, which is rare. Wt loss has been slowly trending down since Sept 2012. Was 169, now 162. May be multifactorial but unable to exclude underlying issue. Increase Nexium to BID, follow GERD diet, return in 4 weeks. If no improvement, likely pursue EGD. Pt aware and states understanding.

## 2011-11-22 NOTE — Progress Notes (Signed)
Faxed to PCP

## 2011-12-10 LAB — COMPREHENSIVE METABOLIC PANEL
ALT: 15 U/L (ref 7–35)
AST: 24 U/L
Albumin: 3.4
Alkaline Phosphatase: 68 U/L
BUN: 14 mg/dL (ref 4–21)
CO2: 30 mmol/L
Calcium: 9.5 mg/dL
Chloride: 102 mmol/L
Creat: 0.72
Glucose: 87
Potassium: 4.2 mmol/L
Sodium: 138 mmol/L (ref 137–147)
Total Bilirubin: 0.5 mg/dL
Total Protein: 6.2 g/dL

## 2011-12-10 LAB — CBC WITH DIFFERENTIAL/PLATELET
HCT: 35 %
Hemoglobin: 1.8 g/dL — AB (ref 12.0–16.0)
WBC: 6.5
platelet count: 220

## 2011-12-17 ENCOUNTER — Ambulatory Visit (INDEPENDENT_AMBULATORY_CARE_PROVIDER_SITE_OTHER): Payer: Medicare Other | Admitting: Urgent Care

## 2011-12-17 ENCOUNTER — Encounter: Payer: Self-pay | Admitting: Urgent Care

## 2011-12-17 VITALS — BP 127/70 | HR 68 | Temp 97.2°F | Ht 63.0 in | Wt 159.4 lb

## 2011-12-17 DIAGNOSIS — K219 Gastro-esophageal reflux disease without esophagitis: Secondary | ICD-10-CM

## 2011-12-17 DIAGNOSIS — K5289 Other specified noninfective gastroenteritis and colitis: Secondary | ICD-10-CM

## 2011-12-17 DIAGNOSIS — D649 Anemia, unspecified: Secondary | ICD-10-CM

## 2011-12-17 DIAGNOSIS — K52839 Microscopic colitis, unspecified: Secondary | ICD-10-CM

## 2011-12-17 DIAGNOSIS — R634 Abnormal weight loss: Secondary | ICD-10-CM

## 2011-12-17 MED ORDER — ESOMEPRAZOLE MAGNESIUM 40 MG PO CPDR: 40.0000 mg | DELAYED_RELEASE_CAPSULE | Freq: Two times a day (BID) | ORAL | Status: DC

## 2011-12-17 NOTE — Progress Notes (Signed)
Referring Provider: Forest Gleason, MD Primary Care Physician:  Denita Lung, DO, DO Primary Gastroenterologist: Dr. Jena Gauss   Chief Complaint  Patient presents with  . Follow-up    HPI:  Marie [name] is a pleasant 71 y.o. female with history of microscopic colitis. Unfortunately she was unable to complete Entocort therapy due to cost. She continues to have explosive diarrhea on a daily basis. When she wakes up in the morning she has a loose stool, and then this is followed by bowel movement every time she eats. She is having 5-6 loose runny explosive stools daily. She also has chronic GERD. Her Nexium was increased to 40mg  BID and her heartburn is much better. She does however have a dry mouth with this.  She has tried Bentyl, Pepto, Imodium, Questran 2 g daily, and a lactose-free diet to see if this makes any difference with her diarrhea. She is on Crestor daily. She did respond to a course of prednisone. Celiac panel negative. She tells me overall she's had no changes. Over the last month she's avoided dairy, she did notice a worsening with her symptoms with ice cream.  Denies rectal bleeding or melena.  She has had an unintentional weight loss of 10# in past 8 months.  C/o anorexia.  She is afraid to eat due to explosive diarrhea.  Denies significant abdominal pain. She does have a history of iron deficiency anemia chronically.  No NSAIDs. She is scheduled to have a knee replacement next week.  Past Medical History  Diagnosis Date  . GERD (gastroesophageal reflux disease)   . Hypertension   . Hypercholesterolemia   . Collagenous colitis     colonoscopy 2009  . Fibromyalgia   . IBS (irritable bowel syndrome)   . Depression   . Lymphocytic colitis     colonoscopy 2007, TCS 2013  . S/P endoscopy Sept 2008    small hh, s/p 23 French dilator  . Anemia    Past Surgical History  Procedure Date  . Abdominal hysterectomy   . Fistula of colon   . Nose surgery   . Foot surgery   . Knee surgery    . Colonoscopy 04/13/2008    anal papilla and internal hemorrhage otherwise normal /left side diverticula  . Colonoscopy 09/20/2011    Adequate preparation. Internal hemorrhoids; otherwise normal/ Left-sided diverticula, biopsies positive for lymphocytic colitis   Current Outpatient Prescriptions  Medication Sig Dispense Refill  . Ascorbic Acid (VITAMIN C PO) Take 1 tablet by mouth every morning.        Marland Kitchen CALCIUM PO Take 1 tablet by mouth every morning.        . Cholecalciferol (VITAMIN D3) 1000 UNITS CAPS Take 1,000 Units by mouth daily.      . citalopram (CELEXA) 20 MG tablet Take 20 mg by mouth every morning.       Marland Kitchen esomeprazole (NEXIUM) 40 MG capsule Take 1 capsule (40 mg total) by mouth 2 (two) times daily. 30 minutes before breakfast and 30 minutes before dinner for 1 month.  60 capsule  5  . hydrochlorothiazide (MICROZIDE) 12.5 MG capsule Take 12.5 mg by mouth every morning.       Marland Kitchen losartan (COZAAR) 50 MG tablet Take 75 mg by mouth every morning.        . Multiple Vitamins-Minerals (CENTRUM SILVER ULTRA WOMENS PO) Take 1 tablet by mouth every morning.        Marland Kitchen oxybutynin (DITROPAN) 5 MG tablet Take 5 mg by mouth 2 (  two) times daily.       . Probiotic Product (RESTORA) CAPS Take 1 capsule by mouth daily.  30 capsule  0  . traMADol (ULTRAM) 50 MG tablet Take 50 mg by mouth every 6 (six) hours as needed. For pain      . vitamin B-12 (CYANOCOBALAMIN) 1000 MCG tablet Take 1,000 mcg by mouth daily.      Marland Kitchen DISCONTD: esomeprazole (NEXIUM) 40 MG capsule Take 1 capsule (40 mg total) by mouth 2 (two) times daily. 30 minutes before breakfast and 30 minutes before dinner for 1 month.  60 capsule  3   No Known Allergies  Review of Systems: Gen: Denies any fever, chills, sweats, see history of present illness CV: Denies chest pain, angina, palpitations, syncope, orthopnea, PND, peripheral edema, and claudication. Resp: Denies dyspnea at rest, dyspnea with exercise, cough, sputum, wheezing, coughing  up blood, and pleurisy. GI: Denies vomiting blood, jaundice, and fecal incontinence.   Denies dysphagia or odynophagia. Derm: Denies rash, itching, dry skin, hives, moles, warts, or unhealing ulcers.  Psych: Denies depression, anxiety, memory loss, suicidal ideation, hallucinations, paranoia, and confusion. Heme: Denies bruising, bleeding, and enlarged lymph nodes.  Physical Exam: BP 127/70  Pulse 68  Temp(Src) 97.2 F (36.2 C) (Temporal)  Ht 5\' 3"  (1.6 m)  Wt 159 lb 6.4 oz (72.303 kg)  BMI 28.24 kg/m2 General:   Alert,  Well-developed, well-nourished, pleasant and cooperative in NAD Head:  Normocephalic and atraumatic. Eyes:  Sclera clear, no icterus.   Conjunctiva pink. Mouth:  No deformity or lesions.  Oropharynx pink & moist. Neck:  Supple; no masses or thyromegaly. Heart:  Regular rate and rhythm; no murmurs, clicks, rubs,  or gallops. Abdomen:   Normal bowel sounds.  Soft and nondistended. Mild left lower quadrant pain on deep palpation.  No masses, hepatosplenomegaly or hernias noted. No guarding or rebound tenderness.  Msk:  Symmetrical without gross deformities.  Pulses:  Normal pulses noted. Extremities:  Without clubbing or edema. Neurologic:  Alert and  oriented x4;  grossly normal neurologically. Skin:  Intact without significant lesions or rashes. Cervical Nodes:  No significant cervical adenopathy. Psych:  Alert and cooperative. Normal mood and affect.

## 2011-12-17 NOTE — Assessment & Plan Note (Addendum)
Suspect microscopic colitis as the culprit of her chronic explosive diarrhea. However she does have significant unintentional weight loss of 10 pounds in the last 9 months. No relief with standard treatment for microscopic colitis. Unfortunately she cannot afford Entocort which was seen really good results for microscopic colitis.  Continue restora daily Resume Questran 4 g daily for diarrhea not within 2 hours of any of your other medications

## 2011-12-17 NOTE — Patient Instructions (Signed)
Continue Nexium 40 mg before breakfast and dinner Continue restora daily Resume Questran 4 g daily for diarrhea not within 2 hours of any of your other medications Go get your labs today We'll call you with CT report We will request labs from [facility]

## 2011-12-17 NOTE — Assessment & Plan Note (Signed)
Good response to heartburn with twice a day Nexium 40mg . She should continue this.

## 2011-12-17 NOTE — Assessment & Plan Note (Addendum)
Last hemoglobin 11. Low normal iron and ferritin & percent saturation low. No gross GI bleeding. Chronic diarrhea is concerning in the setting of unexplained weight loss. No evidence of celiac disease. TSH normal.  CBC, CMP CT of abdomen and pelvis with IV and oral contrast if her creatinine is normal to rule out occult malignancy or lymphoma.

## 2011-12-18 ENCOUNTER — Encounter (HOSPITAL_COMMUNITY): Payer: Self-pay

## 2011-12-18 ENCOUNTER — Ambulatory Visit (HOSPITAL_COMMUNITY)
Admission: RE | Admit: 2011-12-18 | Discharge: 2011-12-18 | Disposition: A | Discharge status: Home / Self Care | Payer: Medicare Other | Source: Ambulatory Visit | Attending: Urgent Care | Admitting: Urgent Care

## 2011-12-18 DIAGNOSIS — K5730 Diverticulosis of large intestine without perforation or abscess without bleeding: Secondary | ICD-10-CM | POA: Insufficient documentation

## 2011-12-18 DIAGNOSIS — R634 Abnormal weight loss: Secondary | ICD-10-CM | POA: Insufficient documentation

## 2011-12-18 DIAGNOSIS — R197 Diarrhea, unspecified: Secondary | ICD-10-CM | POA: Insufficient documentation

## 2011-12-18 DIAGNOSIS — D509 Iron deficiency anemia, unspecified: Secondary | ICD-10-CM | POA: Insufficient documentation

## 2011-12-18 LAB — CBC WITH DIFFERENTIAL/PLATELET
Basophils Absolute: 0 10*3/uL (ref 0.0–0.1)
Basophils Relative: 0 % (ref 0–1)
Eosinophils Absolute: 0.1 10*3/uL (ref 0.0–0.7)
Eosinophils Relative: 2 % (ref 0–5)
HCT: 34.9 % — ABNORMAL LOW (ref 36.0–46.0)
Hemoglobin: 11.5 g/dL — ABNORMAL LOW (ref 12.0–15.0)
Lymphocytes Relative: 34 % (ref 12–46)
Lymphs Abs: 2.1 10*3/uL (ref 0.7–4.0)
MCH: 28.3 pg (ref 26.0–34.0)
MCHC: 33 g/dL (ref 30.0–36.0)
MCV: 85.7 fL (ref 78.0–100.0)
Monocytes Absolute: 0.6 10*3/uL (ref 0.1–1.0)
Monocytes Relative: 10 % (ref 3–12)
Neutro Abs: 3.3 10*3/uL (ref 1.7–7.7)
Neutrophils Relative %: 55 % (ref 43–77)
Platelets: 287 10*3/uL (ref 150–400)
RBC: 4.07 MIL/uL (ref 3.87–5.11)
RDW: 12.7 % (ref 11.5–15.5)
WBC: 6.1 10*3/uL (ref 4.0–10.5)

## 2011-12-18 LAB — COMPREHENSIVE METABOLIC PANEL
ALT: 14 U/L (ref 0–35)
AST: 21 U/L (ref 0–37)
Albumin: 4 g/dL (ref 3.5–5.2)
Alkaline Phosphatase: 80 U/L (ref 39–117)
BUN: 16 mg/dL (ref 6–23)
CO2: 29 mEq/L (ref 19–32)
Calcium: 9.7 mg/dL (ref 8.4–10.5)
Chloride: 102 mEq/L (ref 96–112)
Creat: 0.72 mg/dL (ref 0.50–1.10)
Glucose, Bld: 89 mg/dL (ref 70–99)
Potassium: 3.9 mEq/L (ref 3.5–5.3)
Sodium: 138 mEq/L (ref 135–145)
Total Bilirubin: 0.5 mg/dL (ref 0.3–1.2)
Total Protein: 7 g/dL (ref 6.0–8.3)

## 2011-12-18 MED ORDER — IOHEXOL 300 MG/ML  SOLN
100.0000 mL | Freq: Once | INTRAMUSCULAR | Status: AC | PRN
  Administered 2011-12-18: 100 mL via INTRAVENOUS

## 2011-12-18 NOTE — Progress Notes (Signed)
Labs from 12/10/11 reviewed. Hemoglobin 11.8, hematocrit 35.2. Otherwise normal CBC and CMP

## 2011-12-18 NOTE — Progress Notes (Signed)
Faxed to PCP

## 2011-12-18 NOTE — Progress Notes (Signed)
Quick Note:  Anemia has improved. Hemoglobin is now 11.5. Otherwise normal labs.  Await CT scan. CC: Denita Lung, DO ______

## 2011-12-19 NOTE — Progress Notes (Signed)
Results Cc to PCP  

## 2011-12-19 NOTE — Progress Notes (Signed)
Quick Note:  Discuss results with patient. Diarrhea due microscopic colitis. Nothing to explain diarrhea, anemia, or weight loss on CT scan. She should begin Questran as directed at office visit as she has not done this yet Continued probiotic. Call if diarrhea persists. CC: Denita Lung, DO  ______

## 2012-06-04 ENCOUNTER — Telehealth: Payer: Self-pay | Admitting: *Deleted

## 2012-06-04 NOTE — Telephone Encounter (Signed)
Ms Rey called today. She is requesting a refill on her nexium, however, she is in the donut hole right now and is wondering if we have any free samples. Please follow up. Thanks.

## 2012-06-04 NOTE — Telephone Encounter (Signed)
Gave #4 boxes of nexium. LMOM for pt to pick up.  Pt still needs refill.

## 2012-06-05 MED ORDER — ESOMEPRAZOLE MAGNESIUM 40 MG PO CPDR: 40.0000 mg | DELAYED_RELEASE_CAPSULE | Freq: Two times a day (BID) | ORAL | Status: DC

## 2012-06-05 NOTE — Addendum Note (Signed)
Addended by: Tiffany Kocher on: 06/05/2012 02:10 PM   Modules accepted: Orders

## 2012-06-09 NOTE — Telephone Encounter (Signed)
rx has been faxed to [facility].

## 2012-12-19 ENCOUNTER — Other Ambulatory Visit: Payer: Self-pay | Admitting: Gastroenterology

## 2014-10-18 ENCOUNTER — Encounter: Payer: Self-pay | Admitting: Nurse Practitioner

## 2014-10-18 ENCOUNTER — Ambulatory Visit (INDEPENDENT_AMBULATORY_CARE_PROVIDER_SITE_OTHER): Payer: Medicare HMO | Admitting: Nurse Practitioner

## 2014-10-18 VITALS — BP 130/72 | HR 63 | Temp 97.6°F | Ht 64.0 in | Wt 168.6 lb

## 2014-10-18 DIAGNOSIS — K52839 Microscopic colitis, unspecified: Secondary | ICD-10-CM

## 2014-10-18 DIAGNOSIS — K5289 Other specified noninfective gastroenteritis and colitis: Secondary | ICD-10-CM

## 2014-10-18 DIAGNOSIS — R197 Diarrhea, unspecified: Secondary | ICD-10-CM

## 2014-10-18 MED ORDER — RESTORA PO CAPS: 1.0000 | ORAL_CAPSULE | Freq: Every day | ORAL | Status: DC

## 2014-10-18 NOTE — Patient Instructions (Signed)
1. Restart the Restora probiotic, one a day. We will give you samples to last for 2 weeks 2. Collect stool samples and bring them to the lab 3. Return for follow-up in 4 weeks

## 2014-10-18 NOTE — Assessment & Plan Note (Signed)
Patient with chronic abdominal pain and diarrhea and a history of microscopic colitis has had worsening symptoms including bloating and abdominal pain for the past 2 months. At this time we will order stool studies to include stool culture and C. difficile by PCR and O&P. We'll restart her on Restora one a day, samples provided for 2 weeks. We'll await stool results and if symptoms persist despite treatment and no sign of intestinal infection will consider treatment for microscopic colitis. She was unable to complete Entocort last time because of cost and was subsequently placed on prednisone taper. We can consider that as a repeat treatment at that time.

## 2014-10-18 NOTE — Progress Notes (Signed)
Referring Provider: Denita LungHolder, Latia, DO Primary Care Physician:  Alleen BorneLAGGETT,ELIN, PA-C Primary GI:  Dr. Jena Gaussourk  Chief Complaint  Patient presents with  . Diarrhea  . Bloated  . Abdominal Pain    HPI:   74 year old female presents for abdominal pain and diarrhea. Has a significant history of microscopic colitis, has been trialed on Entocort previously but unable to complete due to cost. Last flare was given Rx for Questran 4g daily and Restora. Last colonoscopy 09/20/11 for similar symptoms. Results Results showed a adequate preparation. Internal hemorrhoids. Otherwise normal rectum. Left-sided diverticula. Remaining colonic mucosa was normal. Distal 10 cm of terminal ileal mucosa appeared normal. Biopsies were taken throughout the colon. Pathology of the biopsies showed consistent with microscopic colitis. Echo medications included prednisone 4 mg daily for 2 weeks then decrease to 3 mg daily until office follow-up 2 months from date of colonoscopy.  Today she states she has chronic diarrhea and cannot remember the last time she's had a solid bowel movement. Has occasional fecal incontinence. Has started on a new medication for arthritis which has caused increased bloating and incomplete emptying, although now loose. She has since stopped this medication. This morning has had 4 bowel movement of minute amount, which are typically loose and sometimes water. Denies hematochezia, melena, and mucus. Also with occasional abdominal pain associated with her bowel movement and not relieved with defecation. Takes Immodium prn which doesn't help. Denies any other upper or lower GI symptoms.  Past Medical History  Diagnosis Date  . GERD (gastroesophageal reflux disease)   . Hypertension   . Hypercholesterolemia   . Collagenous colitis     colonoscopy 2009  . Fibromyalgia   . IBS (irritable bowel syndrome)   . Depression   . Lymphocytic colitis     colonoscopy 2007, TCS 2013  . S/P endoscopy Sept  2008    small hh, s/p 6056 French dilator  . Anemia     Past Surgical History  Procedure Laterality Date  . Abdominal hysterectomy    . Fistula of colon    . Nose surgery    . Foot surgery    . Knee surgery    . Colonoscopy  04/13/2008    anal papilla and internal hemorrhage otherwise normal /left side diverticula  . Colonoscopy  09/20/2011    Adequate preparation. Internal hemorrhoids; otherwise normal/ Left-sided diverticula, biopsies positive for lymphocytic colitis    Current Outpatient Prescriptions  Medication Sig Dispense Refill  . Ascorbic Acid (VITAMIN C PO) Take 1 tablet by mouth every morning.      Marland Kitchen. CALCIUM PO Take 1 tablet by mouth every morning.      . Cholecalciferol (VITAMIN D3) 1000 UNITS CAPS Take 1,000 Units by mouth daily.    . citalopram (CELEXA) 20 MG tablet Take 20 mg by mouth every morning.     . hydrochlorothiazide (MICROZIDE) 12.5 MG capsule Take 12.5 mg by mouth every morning.     Marland Kitchen. losartan (COZAAR) 50 MG tablet Take 75 mg by mouth every morning.      . meloxicam (MOBIC) 7.5 MG tablet Take 7.5 mg by mouth 2 (two) times daily.    . Multiple Vitamins-Minerals (CENTRUM SILVER ULTRA WOMENS PO) Take 1 tablet by mouth every morning.      Marland Kitchen. NEXIUM 40 MG capsule TAKE  (1)  CAPSULE  TWICE DAILY (TAKE ON AN EMPTY STOMACH AT LEAST 30MIN- UTES BEFORE MEALS). 60 capsule 3  . traMADol (ULTRAM) 50 MG tablet Take  50 mg by mouth every 6 (six) hours as needed. For pain    . vitamin B-12 (CYANOCOBALAMIN) 1000 MCG tablet Take 1,000 mcg by mouth daily.    Marland Kitchen oxybutynin (DITROPAN) 5 MG tablet Take 5 mg by mouth 2 (two) times daily.     . Probiotic Product (RESTORA) CAPS Take 1 capsule by mouth daily. (Patient not taking: Reported on 10/18/2014) 30 capsule 0   No current facility-administered medications for this visit.    Allergies as of 10/18/2014  . (No Known Allergies)    Family History  Problem Relation Age of Onset  . Colon cancer Maternal Uncle     History    Social History  . Marital Status: Widowed    Spouse Name: N/A  . Number of Children: N/A  . Years of Education: N/A   Social History Main Topics  . Smoking status: Never Smoker   . Smokeless tobacco: Not on file  . Alcohol Use: No  . Drug Use: No  . Sexual Activity: Not on file   Other Topics Concern  . None   Social History Narrative    Review of Systems: Gen: Denies fever, chills, anorexia. Denies fatigue, weakness, weight loss. Has a baseline decreased appetite which is no worse now. CV: Denies chest pain, palpitations, syncope, peripheral edema. Resp: Admits chronic dyspnea which is at baseline for her,. GI: See HPI. Denies vomiting blood, jaundice, and fecal incontinence. Denies dysphagia or odynophagia. GERD symptoms well controlled on current PPI. Derm: Denies rash, itching, dry skin Psych: Denies depression, anxiety, memory loss, confusion.   Heme: Denies bruising, bleeding, and enlarged lymph nodes.  Physical Exam: BP 130/72 mmHg  Pulse 63  Temp(Src) 97.6 F (36.4 C)  Ht  (1.626 m)  Wt 168 lb 9.6 oz (76.476 kg)  BMI 28.93 kg/m2 General:   Alert and oriented. No distress noted. Pleasant and cooperative.  Head:  Normocephalic and atraumatic. Eyes:  Conjuctiva clear without scleral icterus. Neck:  Supple, without mass or thyromegaly. Lungs:  Clear to auscultation bilaterally. No wheezes, rales, or rhonchi. No distress.  Heart:  S1, S2 present without murmurs, rubs, or gallops. Regular rate and rhythm. Abdomen:  +BS, soft, and non-distended. Mild TTP to lower abdomen. No rebound or guarding. No HSM or masses noted. Msk:  Symmetrical without gross deformities. Normal posture. Extremities:  Without edema. Neurologic:  Alert and  oriented x4;  grossly normal neurologically. Skin:  Intact without significant lesions or rashes. Psych:  Alert and cooperative. Normal mood and affect.    10/18/2014 1:40 PM

## 2014-10-18 NOTE — Assessment & Plan Note (Signed)
Patient with chronic abdominal pain and diarrhea and a history of microscopic colitis has had worsening symptoms including bloating and abdominal pain for the past 2 months. At this time we will order stool studies to include stool culture and C. difficile by PCR and O&P. We'll restart her on Restora one a day, samples provided for 2 weeks. We'll await stool results and if symptoms persist despite treatment and no sign of intestinal infection will consider treatment for microscopic colitis. She was unable to complete Entocort last time because of cost and was subsequently placed on prednisone taper. We can consider that as a repeat treatment at that time. 

## 2014-10-19 NOTE — Progress Notes (Signed)
CC'ED TO PCP 

## 2014-10-28 LAB — OVA AND PARASITE EXAMINATION: OP: NONE SEEN

## 2014-10-28 LAB — CLOSTRIDIUM DIFFICILE BY PCR: Toxigenic C. Difficile by PCR: NOT DETECTED

## 2014-10-31 LAB — STOOL CULTURE

## 2014-11-18 ENCOUNTER — Ambulatory Visit (INDEPENDENT_AMBULATORY_CARE_PROVIDER_SITE_OTHER): Payer: Medicare HMO | Admitting: Nurse Practitioner

## 2014-11-18 ENCOUNTER — Encounter: Payer: Self-pay | Admitting: Nurse Practitioner

## 2014-11-18 VITALS — BP 126/72 | HR 59 | Temp 97.0°F | Ht 63.0 in | Wt 167.4 lb

## 2014-11-18 DIAGNOSIS — K52839 Microscopic colitis, unspecified: Secondary | ICD-10-CM

## 2014-11-18 DIAGNOSIS — K5289 Other specified noninfective gastroenteritis and colitis: Secondary | ICD-10-CM

## 2014-11-18 DIAGNOSIS — R197 Diarrhea, unspecified: Secondary | ICD-10-CM

## 2014-11-18 MED ORDER — CHOLESTYRAMINE 4 G PO PACK: PACK | ORAL | Status: DC

## 2014-11-18 NOTE — Assessment & Plan Note (Signed)
Patient with a history of microscopic colitis last demonstrated on colonoscopy in 2013. Has persistent diarrhea symptoms of more than 4 stools a day, states Marie [name] cannot remember the last time Marie [name] had a normal bowel movement. Unable to afford Entocort. Last treatment included prednisone taper as well as Questran for symptomatic treatment. No red flag/warning signs or symptoms at this time.  Today we'll check a CBC, CMP, and pancreatic elastase to evaluate for pancreatic insufficiency as a possible additional component. We will have her return in 4 weeks for reevaluation of symptoms, review of labs, and consideration for possible repeat colonoscopy and other treatment options.

## 2014-11-18 NOTE — Progress Notes (Signed)
Referring Provider: Claggett, Autumn PattyElin, PA-C Primary Care Physician:  Alleen BorneLAGGETT,ELIN, PA-C Primary GI:  Dr. Jena Gaussourk  Chief Complaint  Patient presents with  . Follow-up    HPI:   74 year old female presents for follow-up on diarrhea and bloating with a history of microscopic colitis. Stool studies were ordered and a probiotic was ordered. Stool studies resulted is negative and no infection. Previous flare up of microscopic colitis, the patient was unable to complete Entocort due to cost and was subsequently placed on a prednisone taper.  Today she states the Restora probiotic did not help. She also tried fiber at home which caused increased bloating. She continues to have diarrhea and urgency. Denies hematochezia or melena. Currently has a bowel movement about 4 or more times a day and is always loose. Cannot remember the last time she's had normal bowel movements. Admits weakness, which she attributes in part to her fibromyalgia. Denies syncope and near syncope.  Denies fever, chills, unintended weight loss. Denies any other upper or lower GI symptoms.  Past Medical History  Diagnosis Date  . GERD (gastroesophageal reflux disease)   . Hypertension   . Hypercholesterolemia   . Collagenous colitis     colonoscopy 2009  . Fibromyalgia   . IBS (irritable bowel syndrome)   . Depression   . Lymphocytic colitis     colonoscopy 2007, TCS 2013  . S/P endoscopy Sept 2008    small hh, s/p 3756 French dilator  . Anemia     Past Surgical History  Procedure Laterality Date  . Abdominal hysterectomy    . Fistula of colon    . Nose surgery    . Foot surgery    . Knee surgery    . Colonoscopy  04/13/2008    anal papilla and internal hemorrhage otherwise normal /left side diverticula  . Colonoscopy  09/20/2011    Adequate preparation. Internal hemorrhoids; otherwise normal/ Left-sided diverticula, biopsies positive for lymphocytic colitis    Current Outpatient Prescriptions  Medication Sig  Dispense Refill  . Ascorbic Acid (VITAMIN C PO) Take 1 tablet by mouth every morning.      Marland Kitchen. CALCIUM PO Take 1 tablet by mouth every morning.      . Cholecalciferol (VITAMIN D3) 1000 UNITS CAPS Take 1,000 Units by mouth daily.    . citalopram (CELEXA) 20 MG tablet Take 20 mg by mouth every morning.     . hydrochlorothiazide (MICROZIDE) 12.5 MG capsule Take 12.5 mg by mouth every morning.     Marland Kitchen. losartan (COZAAR) 50 MG tablet Take 75 mg by mouth every morning.      . meloxicam (MOBIC) 7.5 MG tablet Take 7.5 mg by mouth 2 (two) times daily.    . Multiple Vitamins-Minerals (CENTRUM SILVER ULTRA WOMENS PO) Take 1 tablet by mouth every morning.      Marland Kitchen. NEXIUM 40 MG capsule TAKE  (1)  CAPSULE  TWICE DAILY (TAKE ON AN EMPTY STOMACH AT LEAST 30MIN- UTES BEFORE MEALS). 60 capsule 3  . Probiotic Product (RESTORA) CAPS Take 1 capsule by mouth daily. 14 capsule 0  . traMADol (ULTRAM) 50 MG tablet Take 50 mg by mouth every 6 (six) hours as needed. For pain    . vitamin B-12 (CYANOCOBALAMIN) 1000 MCG tablet Take 1,000 mcg by mouth daily.     No current facility-administered medications for this visit.    Allergies as of 11/18/2014  . (No Known Allergies)    Family History  Problem Relation Age of Onset  .  Colon cancer Maternal Uncle     History   Social History  . Marital Status: Widowed    Spouse Name: N/A  . Number of Children: N/A  . Years of Education: N/A   Social History Main Topics  . Smoking status: Never Smoker   . Smokeless tobacco: Not on file  . Alcohol Use: No  . Drug Use: No  . Sexual Activity: Not on file   Other Topics Concern  . None   Social History Narrative    Review of Systems: Gen: Denies fever, chills, anorexia. Denies fatigue, weakness, weight loss.  CV: Denies chest pain, palpitations, syncope, peripheral edema, and claudication. Resp: Denies dyspnea at rest, cough, wheezing, coughing up blood, and pleurisy. GI: Denies vomiting blood, jaundice, and fecal  incontinence.   Denies dysphagia or odynophagia. Derm: Denies rash, itching, dry skin Psych: Denies depression, anxiety, memory loss, confusion. No homicidal or suicidal ideation.  Heme: Denies bruising, bleeding, and enlarged lymph nodes.  Physical Exam: BP 126/72 mmHg  Pulse 59  Temp(Src) 97 F (36.1 C)  Ht  (1.6 m)  Wt 167 lb 6.4 oz (75.932 kg)  BMI 29.66 kg/m2 General:   Alert and oriented. No distress noted. Pleasant and cooperative.  Head:  Normocephalic and atraumatic. Eyes:  Conjuctiva clear without scleral icterus. Lungs:  Clear to auscultation bilaterally. No wheezes, rales, or rhonchi. No distress.  Heart:  S1, S2 present without murmurs, rubs, or gallops. Regular rate and rhythm. Abdomen:  +BS, soft, non-tender and non-distended. No rebound or guarding. No HSM or masses noted. Msk:  Symmetrical without gross deformities. Normal posture. Extremities:  Without edema. Neurologic:  Alert and  oriented x4;  grossly normal neurologically. Skin:  Intact without significant lesions or rashes. Psych:  Alert and cooperative. Normal mood and affect.    11/18/2014 10:55 AM

## 2014-11-18 NOTE — Progress Notes (Signed)
cc'ed to pcp °

## 2014-11-18 NOTE — Patient Instructions (Addendum)
1. We are ordering some labs for you to have drawn 2. Start taking Questran 1/2 packet in the morning and 1/2 packing in the evening to temporarily help your symptoms 3. It is important to take this medication AT LEAST 1 hour AFTER taking any other medications. 4. Return for follow-up in 4 weeks to re-evaluate.

## 2014-11-18 NOTE — Assessment & Plan Note (Signed)
74 year old female presents for persistent diarrhea and history of microscopic colitis. Previous treatments for microscopic colitis attempted including but budesonide, which the patient was unable to complete because of the cost. Last flare for microscopic colitis was in 2013 and was treated with a prednisone taper. Has tried multiple other symptomatic management regimens including high-fiber, Questran, Imodium, and others which were all minimally effective. Did have success with prednisone taper plus Questran. Currently denies any red flag/warning symptoms. Last colonoscopy in 2013 with microscopic colitis on pathology report. Seen approximately 1 month ago stool studies were normal. Has a history of anemia.   Today we will order a CBC to evaluate for any blood loss, as well as a CMP, and a fecal pancreatic elastase to evaluate for possible pancreatic insufficiency, which has never been looked at. We'll also put her back on a regimen of one half packet of Questran in the morning, and one half packet in the evening. Emphasized the need to take the Questran at least 1 hour after any other medications. We will have her return in 1 month for follow-up to consider possible need for colonoscopy and any further treatment options that may be appropriate based on results of her labs.

## 2014-11-19 LAB — CBC WITH DIFFERENTIAL/PLATELET
Basophils Absolute: 0.1 10*3/uL (ref 0.0–0.1)
Basophils Relative: 1 % (ref 0–1)
Eosinophils Absolute: 0.1 10*3/uL (ref 0.0–0.7)
Eosinophils Relative: 2 % (ref 0–5)
HCT: 36.1 % (ref 36.0–46.0)
Hemoglobin: 11.9 g/dL — ABNORMAL LOW (ref 12.0–15.0)
Lymphocytes Relative: 42 % (ref 12–46)
Lymphs Abs: 2.6 10*3/uL (ref 0.7–4.0)
MCH: 28.1 pg (ref 26.0–34.0)
MCHC: 33 g/dL (ref 30.0–36.0)
MCV: 85.3 fL (ref 78.0–100.0)
MPV: 9.6 fL (ref 8.6–12.4)
Monocytes Absolute: 0.7 10*3/uL (ref 0.1–1.0)
Monocytes Relative: 11 % (ref 3–12)
Neutro Abs: 2.7 10*3/uL (ref 1.7–7.7)
Neutrophils Relative %: 44 % (ref 43–77)
Platelets: 232 10*3/uL (ref 150–400)
RBC: 4.23 MIL/uL (ref 3.87–5.11)
RDW: 13.8 % (ref 11.5–15.5)
WBC: 6.2 10*3/uL (ref 4.0–10.5)

## 2014-11-19 LAB — COMPREHENSIVE METABOLIC PANEL
ALT: 14 U/L (ref 0–35)
AST: 19 U/L (ref 0–37)
Albumin: 3.8 g/dL (ref 3.5–5.2)
Alkaline Phosphatase: 90 U/L (ref 39–117)
BUN: 24 mg/dL — ABNORMAL HIGH (ref 6–23)
CO2: 30 mEq/L (ref 19–32)
Calcium: 9.4 mg/dL (ref 8.4–10.5)
Chloride: 101 mEq/L (ref 96–112)
Creat: 0.75 mg/dL (ref 0.50–1.10)
Glucose, Bld: 85 mg/dL (ref 70–99)
Potassium: 4.6 mEq/L (ref 3.5–5.3)
Sodium: 138 mEq/L (ref 135–145)
Total Bilirubin: 0.4 mg/dL (ref 0.2–1.2)
Total Protein: 6.7 g/dL (ref 6.0–8.3)

## 2014-11-29 ENCOUNTER — Telehealth: Payer: Self-pay | Admitting: Nurse Practitioner

## 2014-11-29 NOTE — Telephone Encounter (Signed)
Can we call the patient and see how she's doing? If she's continuing to have symptoms I can try a different medication besides the Questran until she sees Dr. Jena Gaussourk. Please let me know.

## 2014-11-30 NOTE — Telephone Encounter (Signed)
Noted  

## 2014-11-30 NOTE — Telephone Encounter (Signed)
I spoke with the pt. She said taking 2 packs of questran caused nausea but she cut it back to 1 packet a day and it doesn't cause nausea and it seems to be helping the diarrhea. She said she still has an episode every once in awhile but is doing better. She is aware of her ov date and time with RMR and will call if she needs anything prior to that appt.

## 2014-12-15 ENCOUNTER — Other Ambulatory Visit: Payer: Self-pay | Admitting: Nurse Practitioner

## 2014-12-24 ENCOUNTER — Encounter: Payer: Self-pay | Admitting: Internal Medicine

## 2014-12-24 ENCOUNTER — Ambulatory Visit (INDEPENDENT_AMBULATORY_CARE_PROVIDER_SITE_OTHER): Payer: Medicare HMO | Admitting: Internal Medicine

## 2014-12-24 VITALS — BP 134/69 | HR 70 | Temp 97.3°F | Ht <= 58 in | Wt 166.4 lb

## 2014-12-24 DIAGNOSIS — K5289 Other specified noninfective gastroenteritis and colitis: Secondary | ICD-10-CM

## 2014-12-24 DIAGNOSIS — K52839 Microscopic colitis, unspecified: Secondary | ICD-10-CM

## 2014-12-24 LAB — PANCREATIC ELASTASE, FECAL: Pancreatic Elastase-1, Stool: 445 mcg/g

## 2014-12-24 NOTE — Progress Notes (Signed)
Primary Care Physician:  Alleen BorneLAGGETT,ELIN, PA-C Primary Gastroenterologist:  Dr. Jena Gaussourk  Pre-Procedure History & Physical: HPI:  Marie [name] is a 74 y.o. female here for followup diarrhea felt to be secondary to biopsy-proven microscopic colitis. Takes a half of pack of Questran daily after 3 days she actually gets bound up -  constipated. Could not afford Entocort previously. Did get a remission previously with prednisone. Financial constraints preclude her from obtaining many medications. Has not tried Pepto-Bismol or mesalamine preparation. Has not tried Uceris.  Stool studies negative for cause diarrhea except for fecal elastase which is pending.  Past Medical History  Diagnosis Date  . GERD (gastroesophageal reflux disease)   . Hypertension   . Hypercholesterolemia   . Collagenous colitis     colonoscopy 2009  . Fibromyalgia   . IBS (irritable bowel syndrome)   . Depression   . Lymphocytic colitis     colonoscopy 2007, TCS 2013  . S/P endoscopy Sept 2008    small hh, s/p 7456 French dilator  . Anemia   . Hiatal hernia 2008    small    Past Surgical History  Procedure Laterality Date  . Abdominal hysterectomy    . Fistula of colon    . Nose surgery    . Foot surgery    . Knee surgery    . Colonoscopy  04/13/2008    Dr.Rourk- anal papilla and internal hemorrhage otherwise normal /left side diverticula  . Colonoscopy  09/20/2011    Dr.Rourk- Adequate preparation. Internal hemorrhoids; otherwise normal/ Left-sided diverticula, biopsies positive for lymphocytic colitis  . Esophagogastroduodenoscopy  04/22/2007    Dr.Rourk- normal esophagus, small hiatal hernia o/w normal stomach, D1 and D2 s/p passage of a 56-French maloney dilator  . Flexible sigmoidoscopy  05/29/2006    Dr.Rehman- No evidence of pseudomembranous or acute colitis.    Prior to Admission medications   Medication Sig Start Date End Date Taking? Authorizing Provider  Ascorbic Acid (VITAMIN C PO) Take 1 tablet  by mouth every morning.     Yes Historical Provider, MD  CALCIUM PO Take 1 tablet by mouth every morning.     Yes Historical Provider, MD  Cholecalciferol (VITAMIN D3) 1000 UNITS CAPS Take 1,000 Units by mouth daily.   Yes Historical Provider, MD  cholestyramine Lanetta Inch(QUESTRAN) 4 G packet Take 1/2 pack in the morning and 1/2 pack in the evening. 11/18/14  Yes Anice PaganiniEric A Gill, NP  citalopram (CELEXA) 20 MG tablet Take 20 mg by mouth every morning.    Yes Historical Provider, MD  hydrochlorothiazide (MICROZIDE) 12.5 MG capsule Take 12.5 mg by mouth every morning.    Yes Historical Provider, MD  losartan (COZAAR) 50 MG tablet Take 75 mg by mouth every morning.     Yes Historical Provider, MD  meloxicam (MOBIC) 7.5 MG tablet Take 7.5 mg by mouth 2 (two) times daily.   Yes Historical Provider, MD  Multiple Vitamins-Minerals (CENTRUM SILVER ULTRA WOMENS PO) Take 1 tablet by mouth every morning.     Yes Historical Provider, MD  NEXIUM 40 MG capsule TAKE  (1)  CAPSULE  TWICE DAILY (TAKE ON AN EMPTY STOMACH AT LEAST 30MIN- UTES BEFORE MEALS). 12/19/12  Yes Nira RetortAnna W Sams, NP  Probiotic Product (RESTORA) CAPS Take 1 capsule by mouth daily. 10/18/14  Yes Anice PaganiniEric A Gill, NP  traMADol (ULTRAM) 50 MG tablet Take 50 mg by mouth every 6 (six) hours as needed. For pain   Yes Historical Provider, MD  vitamin  B-12 (CYANOCOBALAMIN) 1000 MCG tablet Take 1,000 mcg by mouth daily.   Yes Historical Provider, MD    Allergies as of 12/24/2014  . (No Known Allergies)    Family History  Problem Relation Age of Onset  . Colon cancer Maternal Uncle     History   Social History  . Marital Status: Widowed    Spouse Name: N/A  . Number of Children: N/A  . Years of Education: N/A   Occupational History  . Not on file.   Social History Main Topics  . Smoking status: Never Smoker   . Smokeless tobacco: Not on file  . Alcohol Use: No  . Drug Use: No  . Sexual Activity: Not on file   Other Topics Concern  . Not on file    Social History Narrative    Review of Systems: See HPI, otherwise negative ROS  Physical Exam: BP 134/69 mmHg  Pulse 70  Temp(Src) 97.3 F (36.3 C) (Oral)  Ht 4\' 3"  (1.295 m)  Wt 166 lb 6.4 oz (75.479 kg)  BMI 45.01 kg/m2 General:   Alert,  Well-developed, well-nourished, pleasant and cooperative in NAD Skin:  Intact without significant lesions or rashes. Eyes:  Sclera clear, no icterus.   Conjunctiva pink. Ears:  Normal auditory acuity. Nose:  No deformity, discharge,  or lesions. Mouth:  No deformity or lesions. Neck:  Supple; no masses or thyromegaly. No significant cervical adenopathy. Lungs:  Clear throughout to auscultation.   No wheezes, crackles, or rhonchi. No acute distress. Heart:  Regular rate and rhythm; no murmurs, clicks, rubs,  or gallops. Abdomen: Non-distended, normal bowel sounds.  Soft and nontender without appreciable mass or hepatosplenomegaly.  Pulses:  Normal pulses noted. Extremities:  Without clubbing or edema.  Impression:  Recurrent nonbloody diarrhea in a patient with known microscopic colitis  -  biopsy proven in 2013. Stools negative for superimposed bacterial infection.  She really needs to be on a medical regimen which produces an anti-inflammatory effect. Questran certainly will improve the symptoms  but really doesn't  get at the inflammation. Would like to avoid systemic corticosteroids in way of prednisone. Mesalamine preparation may be an alternative. Data is weak on the use of Pepto-Bismol.   Recommendations:  Decrease Cholestyramine to 1/4 packet daily  Begin Uceris 9 mg daily- samples provided,  Office visit in 1 month    Notice: This dictation was prepared with Dragon dictation along with smaller phrase technology. Any transcriptional errors that result from this process are unintentional and may not be corrected upon review.

## 2014-12-24 NOTE — Patient Instructions (Signed)
Decrease Cholestyramine to 1/4 packet daily  Begin Uceris 9 mg daily- samples provided,  Office visit in 1 month

## 2014-12-29 NOTE — Progress Notes (Signed)
cc'ed to pcp °

## 2015-01-24 ENCOUNTER — Ambulatory Visit: Payer: Medicare HMO | Admitting: Gastroenterology

## 2015-02-21 ENCOUNTER — Telehealth: Payer: Self-pay

## 2015-02-21 NOTE — Telephone Encounter (Signed)
OK; can try pepto-bismal chewable tablets - 3 chewed and swallowed TID  - may turn stools a dark color - can try for 3- 4 weeks.  OV as needed

## 2015-02-21 NOTE — Telephone Encounter (Signed)
Spoke with the pt- she said she is having the same diarrhea as before. No bleeding, no fever, no antibiotics, no sick contacts. She said RMR gave her uceris the last time she was here and it worked very well but it increased her blood sugar and made her feel drunk. She cancelled her last ov because she was doing well. She wants to know if she can try some prednisone this time and see if it works.

## 2015-02-21 NOTE — Telephone Encounter (Signed)
Pt is calling to see if RMR would call her in some Prednisone for her diarrhea. Please advise

## 2015-02-22 NOTE — Telephone Encounter (Signed)
Pt is aware.  

## 2015-12-27 ENCOUNTER — Ambulatory Visit (INDEPENDENT_AMBULATORY_CARE_PROVIDER_SITE_OTHER): Payer: Medicare HMO | Admitting: Gastroenterology

## 2015-12-27 ENCOUNTER — Encounter: Payer: Self-pay | Admitting: Gastroenterology

## 2015-12-27 VITALS — BP 149/71 | HR 61 | Temp 97.6°F | Ht 62.0 in | Wt 162.6 lb

## 2015-12-27 DIAGNOSIS — R1012 Left upper quadrant pain: Secondary | ICD-10-CM | POA: Diagnosis not present

## 2015-12-27 DIAGNOSIS — R197 Diarrhea, unspecified: Secondary | ICD-10-CM | POA: Diagnosis not present

## 2015-12-27 DIAGNOSIS — R1032 Left lower quadrant pain: Secondary | ICD-10-CM | POA: Diagnosis not present

## 2015-12-27 LAB — CBC WITH DIFFERENTIAL/PLATELET
Basophils Absolute: 0 cells/uL (ref 0–200)
Basophils Relative: 0 %
Eosinophils Absolute: 136 cells/uL (ref 15–500)
Eosinophils Relative: 2 %
HCT: 35 % (ref 35.0–45.0)
Hemoglobin: 11.2 g/dL — ABNORMAL LOW (ref 11.7–15.5)
Lymphocytes Relative: 33 %
Lymphs Abs: 2244 cells/uL (ref 850–3900)
MCH: 27.5 pg (ref 27.0–33.0)
MCHC: 32 g/dL (ref 32.0–36.0)
MCV: 86 fL (ref 80.0–100.0)
MPV: 10.5 fL (ref 7.5–12.5)
Monocytes Absolute: 612 cells/uL (ref 200–950)
Monocytes Relative: 9 %
Neutro Abs: 3808 cells/uL (ref 1500–7800)
Neutrophils Relative %: 56 %
Platelets: 258 10*3/uL (ref 140–400)
RBC: 4.07 MIL/uL (ref 3.80–5.10)
RDW: 13.4 % (ref 11.0–15.0)
WBC: 6.8 10*3/uL (ref 3.8–10.8)

## 2015-12-27 LAB — BASIC METABOLIC PANEL
BUN: 24 mg/dL (ref 7–25)
CO2: 28 mmol/L (ref 20–31)
Calcium: 9.4 mg/dL (ref 8.6–10.4)
Chloride: 103 mmol/L (ref 98–110)
Creat: 0.73 mg/dL (ref 0.60–0.93)
Glucose, Bld: 101 mg/dL — ABNORMAL HIGH (ref 65–99)
Potassium: 3.8 mmol/L (ref 3.5–5.3)
Sodium: 139 mmol/L (ref 135–146)

## 2015-12-27 LAB — LIPASE: Lipase: 51 U/L (ref 7–60)

## 2015-12-27 NOTE — Patient Instructions (Signed)
1. Please have your labs and CT scan done. We will call you with results. 2. If you have worsening abdominal pain, fever please let me know or go to the emergency department if needed.

## 2015-12-27 NOTE — Assessment & Plan Note (Signed)
75 year old female with history of IBS, microscopic colitis, diverticulosis who presents with 4 week history of worsening diarrhea associated with left-sided abdominal pain and low-grade fever. Abdominal pain not usually associated with microscopic colitis. Cannot exclude overlapping IBS however. I feel at this time is important to exclude diverticulitis given low-grade fever. Plan on labs and a CT scan of the abdomen and pelvis with IV and oral contrast. For worsening pain or fever she'll let us know or go to emergency department. Further recommendations to follow.

## 2015-12-27 NOTE — Progress Notes (Signed)
Primary Care Physician: Alleen Borne  Primary Gastroenterologist:  Roetta Sessions, MD   Chief Complaint  Patient presents with  . Abdominal Pain  . Diarrhea    HPI: Marie [name] is a 75 y.o. female here For further evaluation of abdominal pain and diarrhea. She was last seen in May 2016. She has a history of microscopic colitis.  She presents with approximately 4 week history of worsening diarrhea with new symptom of abdominal pain. Typically previous symptoms not associated with significant pain there for the past 3-4 week she's had pain predominantly in the left lower quadrant radiating up into the left upper quadrant. She states she has had some pain in her bottom with sitting but denies pain with passing a bowel movement or with wiping. Her last colonoscopy was in 2013, she has internal hemorrhoids, left-sided diverticula, distal 10 cm of the terminal ileum was normal, biopsies consistent with lymphocytic colitis. Her son has a history of ulcerative colitis.   She reports her baseline usually is 3-4 loose stools daily, usually after meals. For the past 3-4 weeks she would have 3 or 4 stools before breakfast, one after breakfast. May have multiple other stools after each meal. Denies solid stool. Some nocturnal diarrhea. No melena rectal bleeding. Associated with urgency and incontinence at times. She has utilized Pepto-Bismol in the past for her microscopic colitis, states it has helped some but she never really takes regularly usually when she goes out. No longer on Questran. She reports no nausea or vomiting. She states she has had some low-grade fever. Complains of abdominal bloating. Because of the pain she went to [facility] several days after this started. Blood work shows mild anemia and dehydration per patient. She states she was given Bentyl which has not really helped.     Current Outpatient Prescriptions  Medication Sig Dispense Refill  . Ascorbic Acid (VITAMIN C PO)  Take 1 tablet by mouth every morning.      Marland Kitchen CALCIUM PO Take 1 tablet by mouth every morning.      . Cholecalciferol (VITAMIN D3) 1000 UNITS CAPS Take 1,000 Units by mouth daily.    . citalopram (CELEXA) 20 MG tablet Take 20 mg by mouth every morning.     . hydrochlorothiazide (MICROZIDE) 12.5 MG capsule Take 12.5 mg by mouth every morning.     Marland Kitchen losartan (COZAAR) 50 MG tablet Take 75 mg by mouth every morning.      . meloxicam (MOBIC) 7.5 MG tablet Take 7.5 mg by mouth 2 (two) times daily.    . Multiple Vitamins-Minerals (CENTRUM SILVER ULTRA WOMENS PO) Take 1 tablet by mouth every morning.      Marland Kitchen NEXIUM 40 MG capsule TAKE  (1)  CAPSULE  TWICE DAILY (TAKE ON AN EMPTY STOMACH AT LEAST - UTES BEFORE MEALS). 60 capsule 3  . traMADol (ULTRAM) 50 MG tablet Take 50 mg by mouth every 6 (six) hours as needed. For pain    . vitamin B-12 (CYANOCOBALAMIN) 1000 MCG tablet Take 1,000 mcg by mouth daily.     No current facility-administered medications for this visit.    Allergies as of 12/27/2015 - Review Complete 12/27/2015  Allergen Reaction Noted  . Codeine  12/27/2015    ROS:  General: Negative for anorexia, weight loss, fever, chills, fatigue, weakness. ENT: Negative for hoarseness, difficulty swallowing , nasal congestion. CV: Negative for chest pain, angina, palpitations, dyspnea on exertion, peripheral edema.  Respiratory: Negative for dyspnea at rest, dyspnea  on exertion, cough, sputum, wheezing.  GI: See history of present illness. GU:  Negative for dysuria, hematuria, urinary incontinence, urinary frequency, nocturnal urination.  Endo: Negative for unusual weight change.    Physical Examination:   BP 149/71 mmHg  Pulse 61  Temp(Src) 97.6 F (36.4 C)  Ht 5\' 2"  (1.575 m)  Wt 162 lb 9.6 oz (73.755 kg)  BMI 29.73 kg/m2  General: Well-nourished, well-developed in no acute distress.  Eyes: No icterus. Mouth: Oropharyngeal mucosa moist and pink , no lesions erythema or  exudate. Lungs: Clear to auscultation bilaterally.  Heart: Regular rate and rhythm, no murmurs rubs or gallops.  Abdomen: Bowel sounds are normal, mild to moderate tenderness left lower quadrant up into left upper quadrant. She has some tenderness with palpation around the left lower rib cage margin.  nondistended, no hepatosplenomegaly or masses, no abdominal bruits or hernia , no rebound or guarding.   Extremities: No lower extremity edema. No clubbing or deformities. Neuro: Alert and oriented x 4   Skin: Warm and dry, no jaundice.   Psych: Alert and cooperative, normal mood and affect.

## 2015-12-27 NOTE — Progress Notes (Signed)
cc'ed to pcp °

## 2015-12-30 ENCOUNTER — Ambulatory Visit (HOSPITAL_COMMUNITY)
Admission: RE | Admit: 2015-12-30 | Discharge: 2015-12-30 | Disposition: A | Discharge status: Home / Self Care | Payer: Medicare HMO | Source: Ambulatory Visit | Attending: Gastroenterology | Admitting: Gastroenterology

## 2015-12-30 DIAGNOSIS — R1012 Left upper quadrant pain: Secondary | ICD-10-CM

## 2015-12-30 DIAGNOSIS — R1032 Left lower quadrant pain: Secondary | ICD-10-CM | POA: Diagnosis not present

## 2015-12-30 DIAGNOSIS — K5730 Diverticulosis of large intestine without perforation or abscess without bleeding: Secondary | ICD-10-CM | POA: Diagnosis not present

## 2015-12-30 DIAGNOSIS — R197 Diarrhea, unspecified: Secondary | ICD-10-CM

## 2015-12-30 DIAGNOSIS — R1903 Right lower quadrant abdominal swelling, mass and lump: Secondary | ICD-10-CM | POA: Diagnosis not present

## 2015-12-30 MED ORDER — IOPAMIDOL (ISOVUE-300) INJECTION 61%
100.0000 mL | Freq: Once | INTRAVENOUS | Status: AC | PRN
  Administered 2015-12-30: 100 mL via INTRAVENOUS

## 2016-01-05 ENCOUNTER — Telehealth: Payer: Self-pay | Admitting: Internal Medicine

## 2016-01-05 NOTE — Progress Notes (Signed)
Quick Note:  Please let patient know her labs showed very mild anemia.  Her CT shows nothing to explain her abdominal pain in the left abdomen. No diverticulitis. Small benign-appearing cystic lesion in the right lower quadrant stable for the past 4 years previously felt to be benign. Radiologist recommends follow-up CT scan in 6-12 months to ensure stability.  If patient is still having worse diarrhea then baseline, I would treat for microscopic colitis again. Is patient willing to try Uceris? In the past she has been unable to afford entocort. ALSO NEEDS TO STOP MOBIC AND ALL NSAIDS IF POSSIBLE AS THEY HAVE BEEN ASSOCIATED WITH MICROSCOPIC COLITIS. ______

## 2016-01-05 NOTE — Telephone Encounter (Signed)
See result note.  

## 2016-01-05 NOTE — Telephone Encounter (Signed)
Routing to LSL 

## 2016-01-05 NOTE — Telephone Encounter (Signed)
PATIENT CALLED WANTING CAT SCAN RESULTS   PLEASE ADVISE

## 2016-01-05 NOTE — Progress Notes (Signed)
Quick Note:  Please NIC for CT A/P with contrast for six months for cystic mass in RLQ. ______

## 2016-01-11 NOTE — Progress Notes (Signed)
ON RECALL  °

## 2016-01-20 ENCOUNTER — Other Ambulatory Visit: Payer: Self-pay | Admitting: Gastroenterology

## 2016-01-20 MED ORDER — BUDESONIDE 9 MG PO TB24: 9.0000 mg | ORAL_TABLET | Freq: Every day | ORAL | Status: DC

## 2016-01-20 NOTE — Progress Notes (Signed)
Quick Note:  RX sent for Uceris. ______

## 2016-01-27 ENCOUNTER — Telehealth: Payer: Self-pay | Admitting: Gastroenterology

## 2016-01-27 DIAGNOSIS — R197 Diarrhea, unspecified: Secondary | ICD-10-CM

## 2016-01-27 NOTE — Telephone Encounter (Signed)
Uceris not approved by insurance due to off-label use microscopic colitis.  If patient is still having loose to watery stools, greater than 4 times daily, consider low-dose prednisone for 6 weeks. Prednisone 30 mg daily for 7 days, 20 mg daily for 7 days, 10 mg daily for 14 days, 5 mg daily for 7 days and then off.  If she is not having significant diarrhea, would not prescribe prednisone. If she still having abdominal pain? Any fever? Consider discussing with another extender prior to prescribing prednisone.

## 2016-02-01 NOTE — Telephone Encounter (Signed)
Pt is aware. She said she is still having loose/watery stool. She said it comes and goes and it just depends on day on how many bm's she has in a day. No fever. She still has L side abd pain. She is willing to take prednisone. Is it ok to Rx?

## 2016-02-02 NOTE — Telephone Encounter (Signed)
I feel we need to check Cdiff PCR, culture. If this is negative, we can consider Prednisone (if diarrhea persistent) vs anti-spasmodic if it appears to be more IBS related. Further recommendations per Verlon AuLeslie.

## 2016-02-03 ENCOUNTER — Other Ambulatory Visit: Payer: Self-pay

## 2016-02-03 DIAGNOSIS — R197 Diarrhea, unspecified: Secondary | ICD-10-CM

## 2016-02-03 NOTE — Telephone Encounter (Signed)
Lab order done and fax[facility] to [facility].

## 2016-02-03 NOTE — Telephone Encounter (Signed)
Pt is aware and she is going to see if she can get a stool cup from the lab at her PCP. She will call us back to let us know

## 2016-02-03 NOTE — Telephone Encounter (Signed)
Tried to call pt- NA- LMOM 

## 2016-02-03 NOTE — Telephone Encounter (Signed)
Pt called back and she would like to have the order faxed to her PCP.

## 2016-02-08 NOTE — Telephone Encounter (Signed)
Any results yet?

## 2016-02-08 NOTE — Telephone Encounter (Signed)
Talked with Amy ans patient has not done the test yet

## 2016-02-14 ENCOUNTER — Other Ambulatory Visit: Payer: Self-pay | Admitting: Gastroenterology

## 2016-02-17 LAB — CLOSTRIDIUM DIFFICILE BY PCR: Toxigenic C. Difficile by PCR: NOT DETECTED

## 2016-02-20 NOTE — Telephone Encounter (Signed)
Can we touch base with patient and find out if she did stool studies and see how she is doing. Is she on prednisone?

## 2016-02-22 NOTE — Progress Notes (Signed)
Quick Note:  cdiff negative. Is she on prednisone for the microscopic colitis? How is she doing? ______

## 2016-02-23 NOTE — Telephone Encounter (Signed)
Spoke with LSL, ok to send in prednisone rx per instructions below. rx has been called in to [facility].

## 2016-02-23 NOTE — Telephone Encounter (Signed)
Pt said she was doing ok, but she is still having diarrhea/loose stools several times a day. She is not on prednisone. She would like to try it. Is it ok to send in rx?

## 2016-02-23 NOTE — Telephone Encounter (Signed)
Agree 

## 2016-03-08 NOTE — Telephone Encounter (Signed)
Patient called in to see if her Prednisone had been called in to the pharmacy.  I made her aware that Raynelle Fanning called that in July 20.    She said she will call the pharmacy and see if they still have it

## 2016-05-16 ENCOUNTER — Telehealth: Payer: Self-pay | Admitting: Internal Medicine

## 2016-05-16 NOTE — Telephone Encounter (Signed)
Patient has questions about her medications. Please call her back at [phone]

## 2016-05-17 NOTE — Telephone Encounter (Signed)
Per RMR- ok to send in rx per last taper instructions given by LSL, as long as pt has not been on any recent abx. If on abx, needs to do a cdiff. Spoke with the pt, she has not been on any abx. rx [facility]  [facility].

## 2016-05-17 NOTE — Telephone Encounter (Signed)
Spoke with the pt, she said she got some prednisone from here on 9/13 and it helped her a lot. She has started having loose stools again this week about 4 times a day, no blood, no fever.  Pt wanted to know if she could try some prednisone again?

## 2016-05-17 NOTE — Telephone Encounter (Signed)
Please schedule ov- pt needs to come back in 6-8 weeks.

## 2016-05-18 ENCOUNTER — Encounter: Payer: Self-pay | Admitting: Internal Medicine

## 2016-05-18 NOTE — Telephone Encounter (Signed)
APPT MADE AND LETTER SENT  °

## 2016-06-18 ENCOUNTER — Telehealth: Payer: Self-pay | Admitting: Internal Medicine

## 2016-06-18 NOTE — Telephone Encounter (Signed)
Letter mailed

## 2016-06-18 NOTE — Telephone Encounter (Signed)
Recall for ct °

## 2016-07-04 ENCOUNTER — Other Ambulatory Visit: Payer: Self-pay

## 2016-07-04 DIAGNOSIS — R1031 Right lower quadrant pain: Principal | ICD-10-CM

## 2016-07-04 DIAGNOSIS — G8929 Other chronic pain: Secondary | ICD-10-CM

## 2016-07-09 ENCOUNTER — Encounter: Payer: Self-pay | Admitting: Gastroenterology

## 2016-07-09 ENCOUNTER — Ambulatory Visit (INDEPENDENT_AMBULATORY_CARE_PROVIDER_SITE_OTHER): Payer: Medicare HMO | Admitting: Gastroenterology

## 2016-07-09 VITALS — BP 155/72 | HR 66 | Temp 97.4°F | Ht 63.0 in | Wt 164.6 lb

## 2016-07-09 DIAGNOSIS — K52832 Lymphocytic colitis: Secondary | ICD-10-CM | POA: Diagnosis not present

## 2016-07-09 DIAGNOSIS — R197 Diarrhea, unspecified: Secondary | ICD-10-CM

## 2016-07-09 MED ORDER — COLESTIPOL HCL 1 G PO TABS: 2.0000 g | ORAL_TABLET | Freq: Two times a day (BID) | ORAL | 0 refills | Status: DC

## 2016-07-09 MED ORDER — PANTOPRAZOLE SODIUM 40 MG PO TBEC: 40.0000 mg | DELAYED_RELEASE_TABLET | Freq: Every day | ORAL | 11 refills | Status: DC

## 2016-07-09 NOTE — Progress Notes (Signed)
Primary Care Physician: Alleen BorneLAGGETT,ELIN, PA-C  Primary Gastroenterologist:  Roetta SessionsMichael Rourk, MD   Chief Complaint  Patient presents with  . Abdominal Pain    stopped Prednisone approx 06/15/16, pain started again last week  . Diarrhea    HPI: Marie [name] is a 75 y.o. female here for follow-up. She has a history of microscopic colitis. Last seen in May 2017. Has required 2 courses of prednisone therapy for diarrhea. Unfortunately we could not get Uceris approved for off-label use for microscopic colitis.   Last colonoscopy in 2013, she had internal hemorrhoids, left-sided diverticula, distal 10 cm of the terminal ileum was normal, biopsies consistent with lymphocytic colitis. Son has history of ulcerative colitis. Patient presented back in May with diarrhea associated with abdominal pain. CT scan of abdomen and pelvis at that time revealed mild enlargement of the redundant appearing cystic mass in the right lower quadrant, measuring 4.9 x 3.7 cm, previously 4.8 x 2.9 cm. She had a small periumbilical abdominal wall hernia containing fat only similar to previous examination. Radiologist felt she should have a follow-up CT in 6-12 months for right lower quadrant cystic mass just to ensure stability although was almost certainly felt to be benign with only slight enlargement over 4 years.  Pretty constant abdominal pain, LLQ and generalized. No control over the diarrhea. Last week started getting active again.occurred within 1-2 weeks after coming off the prednisone.  No blood in stool. Trying to chew Pepto Bismol. Three at a time as needed, especially if going somewhere. On prednisone, less frequent stools, less loose, feels better, helps with fibromyalgia. She denies recent abx. Rare nocturnal stools.   gerd well controlled but new insurance will make her nexium unaffordable. prilosec not as effective as nexium. No dysphagia.    Denies any NSAIDs.   Current Outpatient Prescriptions    Medication Sig Dispense Refill  . Ascorbic Acid (VITAMIN C PO) Take 1 tablet by mouth every morning.      Marland Kitchen. CALCIUM PO Take 1 tablet by mouth every morning.      . Cholecalciferol (VITAMIN D3) 1000 UNITS CAPS Take 1,000 Units by mouth daily.    . citalopram (CELEXA) 20 MG tablet Take 20 mg by mouth every morning.     Marland Kitchen. esomeprazole (NEXIUM) 40 MG capsule Take 40 mg by mouth.    . hydrochlorothiazide (MICROZIDE) 12.5 MG capsule Take 12.5 mg by mouth every morning.     Marland Kitchen. losartan (COZAAR) 50 MG tablet Take 75 mg by mouth every morning.      . Multiple Vitamins-Minerals (CENTRUM SILVER ULTRA WOMENS PO) Take 1 tablet by mouth every morning.      . traMADol (ULTRAM) 50 MG tablet Take 50 mg by mouth every 6 (six) hours as needed. For pain    . vitamin B-12 (CYANOCOBALAMIN) 1000 MCG tablet Take 1,000 mcg by mouth daily.    Marland Kitchen. oxybutynin (DITROPAN) 5 MG tablet Take 5 mg by mouth daily.     No current facility-administered medications for this visit.     Allergies as of 07/09/2016 - Review Complete 07/09/2016  Allergen Reaction Noted  . Codeine  12/27/2015   Past Medical History:  Diagnosis Date  . Anemia   . Collagenous colitis    colonoscopy 2009  . Depression   . Fibromyalgia   . GERD (gastroesophageal reflux disease)   . Hiatal hernia 2008   small  . Hypercholesterolemia   . Hypertension   . IBS (irritable bowel syndrome)   .  Lymphocytic colitis    colonoscopy 2007, TCS 2013  . S/P endoscopy Sept 2008   small hh, s/p 5756 French dilator   Past Surgical History:  Procedure Laterality Date  . ABDOMINAL HYSTERECTOMY    . COLONOSCOPY  04/13/2008   Dr.Rourk- anal papilla and internal hemorrhage otherwise normal /left side diverticula  . COLONOSCOPY  09/20/2011   Dr.Rourk- Adequate preparation. Internal hemorrhoids; otherwise normal/ Left-sided diverticula, biopsies positive for lymphocytic colitis  . ESOPHAGOGASTRODUODENOSCOPY  04/22/2007   Dr.Rourk- normal esophagus, small hiatal  hernia o/w normal stomach, D1 and D2 s/p passage of a 56-French maloney dilator  . fistula of colon    . FLEXIBLE SIGMOIDOSCOPY  05/29/2006   Dr.Rehman- No evidence of pseudomembranous or acute colitis.  Marland Kitchen. FOOT SURGERY    . KNEE SURGERY    . NOSE SURGERY     Social History  Substance Use Topics  . Smoking status: Never Smoker  . Smokeless tobacco: Not on file  . Alcohol use No   Family History  Problem Relation Age of Onset  . Colon cancer Maternal Uncle      ROS:  General: Negative for anorexia, weight loss, fever, chills, fatigue, weakness. ENT: Negative for hoarseness, difficulty swallowing , nasal congestion. CV: Negative for chest pain, angina, palpitations, dyspnea on exertion, peripheral edema.  Respiratory: Negative for dyspnea at rest, dyspnea on exertion, cough, sputum, wheezing.  GI: See history of present illness. GU:  Negative for dysuria, hematuria, urinary incontinence, urinary frequency, nocturnal urination.  Endo: Negative for unusual weight change.    Physical Examination:   BP (!) 155/72   Pulse 66   Temp 97.4 F (36.3 C) (Oral)   Ht 5\' 3"  (1.6 m)   Wt 164 lb 9.6 oz (74.7 kg)   BMI 29.16 kg/m   General: Well-nourished, well-developed in no acute distress.  Eyes: No icterus. Mouth: Oropharyngeal mucosa moist and pink , no lesions erythema or exudate. Lungs: Clear to auscultation bilaterally.  Heart: Regular rate and rhythm, no murmurs rubs or gallops.  Abdomen: Bowel sounds are normal, nontender, nondistended, no hepatosplenomegaly or masses, no abdominal bruits or hernia , no rebound or guarding.   Extremities: No lower extremity edema. No clubbing or deformities. Neuro: Alert and oriented x 4   Skin: Warm and dry, no jaundice.   Psych: Alert and cooperative, normal mood and affect.  Labs:  Lab Results  Component Value Date   CREATININE 0.73 12/27/2015   BUN 24 12/27/2015   NA 139 12/27/2015   K 3.8 12/27/2015   CL 103 12/27/2015   CO2 28  12/27/2015   Lab Results  Component Value Date   WBC 6.8 12/27/2015   HGB 11.2 (L) 12/27/2015   HCT 35.0 12/27/2015   MCV 86.0 12/27/2015   PLT 258 12/27/2015   Lab Results  Component Value Date   LIPASE 51 12/27/2015   Lab Results  Component Value Date   ALT 14 11/18/2014   AST 19 11/18/2014   ALKPHOS 90 11/18/2014   BILITOT 0.4 11/18/2014    Imaging Studies: No results found.

## 2016-07-09 NOTE — Patient Instructions (Signed)
1. Please have your CT as planned. 2. Start Colestid two capsules twice daily, do not take within two hours of other medications because it can affect absorption of your other medications. Distally take a dose at bedtime, and try to take a dose sometime prior to lunch. This is for diarrhea. 3. I have sent in a prescription for pantoprazole to see if it is covered by her insurance company. This would take the place of Nexium. 4. I will discuss her case with Dr. Jena Gaussourk. You may require another colonoscopy for persistent diarrhea. Further recommendations to follow.

## 2016-07-11 NOTE — Assessment & Plan Note (Signed)
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 Recently treated with prednisone on a couple of occasions with improvement while on treatment but recurrent symptoms once stopped. Previously unable to obtain budesonide. Questran, Imodium have been minimally effective. Stools studies have been negative on couple of occasions the last few months.   She is due for repeat CT for right adnexal mass. Will await those results. Will discuss further management of diarrhea with Dr. Jena Gaussourk. May need to consider repeat colonoscopy. I will start Colestid 2g bid for symptomatic relief.   For GERD, switch to pantoprazole if covered by insurance as Nexium is no longer affordable.

## 2016-07-13 ENCOUNTER — Ambulatory Visit (HOSPITAL_COMMUNITY)
Admission: RE | Admit: 2016-07-13 | Discharge: 2016-07-13 | Disposition: A | Discharge status: Home / Self Care | Payer: Medicare HMO | Source: Ambulatory Visit | Attending: Gastroenterology | Admitting: Gastroenterology

## 2016-07-13 DIAGNOSIS — K5730 Diverticulosis of large intestine without perforation or abscess without bleeding: Secondary | ICD-10-CM | POA: Insufficient documentation

## 2016-07-13 DIAGNOSIS — R1031 Right lower quadrant pain: Secondary | ICD-10-CM | POA: Diagnosis present

## 2016-07-13 DIAGNOSIS — G8929 Other chronic pain: Secondary | ICD-10-CM

## 2016-07-13 DIAGNOSIS — K429 Umbilical hernia without obstruction or gangrene: Secondary | ICD-10-CM | POA: Diagnosis not present

## 2016-07-13 LAB — POCT I-STAT CREATININE: Creatinine, Ser: 0.8 mg/dL (ref 0.44–1.00)

## 2016-07-13 MED ORDER — IOPAMIDOL (ISOVUE-300) INJECTION 61%
100.0000 mL | Freq: Once | INTRAVENOUS | Status: AC | PRN
  Administered 2016-07-13: 100 mL via INTRAVENOUS

## 2016-07-15 NOTE — Progress Notes (Signed)
Please let patient know that her CT scan shows further slight growth of the lesion in the right lower quadrant and small ?lymph node within the fat containing umb hernia. Radiologist recommends MRI.  Please schedule MRI abd/pelvis with contrast. Dx: RLQ mass, enlarging.

## 2016-07-16 NOTE — Progress Notes (Signed)
CC'D TO PCP °

## 2016-07-17 ENCOUNTER — Other Ambulatory Visit: Payer: Self-pay

## 2016-07-17 DIAGNOSIS — R1903 Right lower quadrant abdominal swelling, mass and lump: Secondary | ICD-10-CM

## 2016-07-19 ENCOUNTER — Telehealth: Payer: Self-pay

## 2016-07-19 NOTE — Telephone Encounter (Signed)
Initiated PA for MRI abd via [employer] website 07/17/16. Faxed additional clinical notes to [phone]. Called [employer] 07/18/16 after noticing DOB in Epic was different that DOB on [employer]. [employer] has DOB: 09/21/1940. [employer] verified pt's address. Was advised to fax [facility] [phone]. Info faxed  07/18/16. Refaxed info to [employer] today and received confirmation. Case still pending at this time.

## 2016-07-19 NOTE — Telephone Encounter (Signed)
LMOM for a return call.  

## 2016-07-19 NOTE — Telephone Encounter (Signed)
Please let patient know that we should consider colonoscopy (last one 09/2011) for recurrent diarrhea to make sure we are not dealing with something new like crohn's or UC as opposed to her previously diagnosed lymphocytic colitis.   If agreeable, offer colonoscopy with RMR.  Hold any antidiarrhea, colestid  5 days before procedure.   As schedule is generally full for next 3-4 weeks, I would go along with course of prednisone if she wants.

## 2016-07-19 NOTE — Telephone Encounter (Signed)
Pt called and said she does not feel the Colestid is helping her. She had a bad time with her stomach after the CT. Now, she is hurting more on LLQ. She said it is not a pain, it is just an annoying feeling. She continues to have diarrhea and has had 3-4 episodes today already.  Having some nausea also.  She also said she hoped that Tana CoastLeslie Lewis, PA would put her back on Prednisone so she can get through Christmas.  Please advise!

## 2016-07-20 ENCOUNTER — Telehealth: Payer: Self-pay

## 2016-07-20 MED ORDER — PREDNISONE 10 MG PO TABS: ORAL_TABLET | ORAL | 0 refills | Status: DC

## 2016-07-20 NOTE — Telephone Encounter (Signed)
Prednisone sent in. As discussed with patient at ov, we cannot continue chronic prednisone.   MRI has been approved, scheduled for next week.   OV with RMR ONLY in next 3 weeks.

## 2016-07-20 NOTE — Telephone Encounter (Signed)
Marie [name] returned Marie [name]'s phone call from yesterday. Advised Marie [name] that Tyler AasDoris was not in today. Informed Marie [name] of recommendation by LSL. She does not want to do Colonoscopy at this time. However, Marie [name] request Prednisone rx be sent to [facility] in [place]. Advised Marie [name] I would let LSL know.

## 2016-07-20 NOTE — Telephone Encounter (Signed)
Approval #: [id number]

## 2016-07-20 NOTE — Telephone Encounter (Signed)
Case still pending. Called [employer] to f/u. Was advised that ordering provider should call [phone] option #4, then option #3. Case is pending d/t pt recently had similar imaging. Forwarding to LSL.

## 2016-07-20 NOTE — Telephone Encounter (Signed)
Case #[id number].

## 2016-07-20 NOTE — Addendum Note (Signed)
Addended by: Tiffany KocherLEWIS, LESLIE S on: 07/20/2016 11:46 AM   Modules accepted: Orders

## 2016-07-23 NOTE — Telephone Encounter (Signed)
Noted  

## 2016-07-23 NOTE — Telephone Encounter (Signed)
Opened in error

## 2016-07-23 NOTE — Telephone Encounter (Signed)
Called and informed pt. She had already picked up Prednisone at drug store. Informed pt of MRI being approved. OV with RMR scheduled for 08/24/16 at 11:00am.

## 2016-07-23 NOTE — Patient Instructions (Signed)
PA# for MRI Abd: [id number]

## 2016-07-24 ENCOUNTER — Other Ambulatory Visit: Payer: Self-pay

## 2016-07-24 ENCOUNTER — Ambulatory Visit (HOSPITAL_COMMUNITY)
Admission: RE | Admit: 2016-07-24 | Discharge: 2016-07-24 | Disposition: A | Discharge status: Home / Self Care | Payer: Medicare HMO | Source: Ambulatory Visit | Attending: Gastroenterology | Admitting: Gastroenterology

## 2016-07-24 DIAGNOSIS — K439 Ventral hernia without obstruction or gangrene: Secondary | ICD-10-CM | POA: Diagnosis not present

## 2016-07-24 DIAGNOSIS — R1903 Right lower quadrant abdominal swelling, mass and lump: Secondary | ICD-10-CM

## 2016-07-24 DIAGNOSIS — K5730 Diverticulosis of large intestine without perforation or abscess without bleeding: Secondary | ICD-10-CM | POA: Diagnosis not present

## 2016-07-24 MED ORDER — GADOBENATE DIMEGLUMINE 529 MG/ML IV SOLN
15.0000 mL | Freq: Once | INTRAVENOUS | Status: AC | PRN
  Administered 2016-07-24: 15 mL via INTRAVENOUS

## 2016-07-24 NOTE — Telephone Encounter (Signed)
PA# for MRI abdomen with and without contrast, MRI Pelvis with and without contrast: [id number].

## 2016-07-25 NOTE — Progress Notes (Signed)
MRI shows 5.3cm benign-appearing cystic lesion in RLQ with slight increase in size since 2007. Likely a benign process but would recommend consultation with gyn. Please refer to gyn. Send copy of MRI and CT results.   Keep OV with RMR 08/2016 as scheduled.

## 2016-07-28 NOTE — Progress Notes (Signed)
REVIEWED-NO ADDITIONAL RECOMMENDATIONS. 

## 2016-08-24 ENCOUNTER — Ambulatory Visit: Payer: Medicare HMO | Admitting: Internal Medicine

## 2016-09-04 ENCOUNTER — Ambulatory Visit (INDEPENDENT_AMBULATORY_CARE_PROVIDER_SITE_OTHER): Payer: Medicare HMO | Admitting: Internal Medicine

## 2016-09-04 ENCOUNTER — Encounter: Payer: Self-pay | Admitting: Internal Medicine

## 2016-09-04 VITALS — BP 123/66 | HR 65 | Temp 98.1°F | Ht 63.0 in | Wt 167.4 lb

## 2016-09-04 DIAGNOSIS — K219 Gastro-esophageal reflux disease without esophagitis: Secondary | ICD-10-CM | POA: Diagnosis not present

## 2016-09-04 DIAGNOSIS — K52838 Other microscopic colitis: Secondary | ICD-10-CM

## 2016-09-04 NOTE — Progress Notes (Signed)
Primary Care Physician:  Alleen BorneLAGGETT,ELIN, PA-C Primary Gastroenterologist:  Dr. Jena Gaussourk  Pre-Procedure History & Physical: HPI:  Marie [name] is a 76 y.o. female here for follow-up of GERD and microscopic colitis. Previously diagnosed with lymphocytic and collagenous. Finished prednisone last week. Diarrhea has resolved; no incontinence 1-2 formed bowel movements daily. Reflux symptoms well controlled on pantoprazole 40 mg daily. No dysphagia. Slightly enlarging cystic mass right pelvis on MRI. Etiology not well defined by MRI. Slow growth since 2007.  Past Medical History:  Diagnosis Date  . Anemia   . Collagenous colitis    colonoscopy 2009  . Depression   . Fibromyalgia   . GERD (gastroesophageal reflux disease)   . Hiatal hernia 2008   small  . Hypercholesterolemia   . Hypertension   . IBS (irritable bowel syndrome)   . Lymphocytic colitis    colonoscopy 2007, TCS 2013  . S/P endoscopy Sept 2008   small hh, s/p 1656 French dilator    Past Surgical History:  Procedure Laterality Date  . ABDOMINAL HYSTERECTOMY    . COLONOSCOPY  04/13/2008   Dr.Tiffanni Scarfo- anal papilla and internal hemorrhage otherwise normal /left side diverticula  . COLONOSCOPY  09/20/2011   Dr.Anyely Cunning- Adequate preparation. Internal hemorrhoids; otherwise normal/ Left-sided diverticula, biopsies positive for lymphocytic colitis  . ESOPHAGOGASTRODUODENOSCOPY  04/22/2007   Dr.Anjanae Woehrle- normal esophagus, small hiatal hernia o/w normal stomach, D1 and D2 s/p passage of a 56-French maloney dilator  . fistula of colon    . FLEXIBLE SIGMOIDOSCOPY  05/29/2006   Dr.Rehman- No evidence of pseudomembranous or acute colitis.  Marland Kitchen. FOOT SURGERY    . KNEE SURGERY    . NOSE SURGERY      Prior to Admission medications   Medication Sig Start Date End Date Taking? Authorizing Provider  Ascorbic Acid (VITAMIN C PO) Take 1 tablet by mouth every morning.     Yes Historical Provider, MD  CALCIUM PO Take 1 tablet by mouth every morning.      Yes Historical Provider, MD  Cholecalciferol (VITAMIN D3) 1000 UNITS CAPS Take 1,000 Units by mouth daily.   Yes Historical Provider, MD  citalopram (CELEXA) 20 MG tablet Take 20 mg by mouth every morning.    Yes Historical Provider, MD  hydrochlorothiazide (MICROZIDE) 12.5 MG capsule Take 12.5 mg by mouth every morning.    Yes Historical Provider, MD  losartan (COZAAR) 50 MG tablet Take 75 mg by mouth every morning.     Yes Historical Provider, MD  Multiple Vitamins-Minerals (CENTRUM SILVER ULTRA WOMENS PO) Take 1 tablet by mouth every morning.     Yes Historical Provider, MD  Omega-3 Fatty Acids (FISH OIL) 1000 MG CAPS Take 2 capsules by mouth daily.   Yes Historical Provider, MD  oxybutynin (DITROPAN) 5 MG tablet Take 5 mg by mouth daily.   Yes Historical Provider, MD  pantoprazole (PROTONIX) 40 MG tablet Take 1 tablet (40 mg total) by mouth daily before breakfast. 07/09/16  Yes Tiffany KocherLeslie S Lewis, PA-C  traMADol (ULTRAM) 50 MG tablet Take 50 mg by mouth every 6 (six) hours as needed. For pain   Yes Historical Provider, MD  vitamin B-12 (CYANOCOBALAMIN) 1000 MCG tablet Take 1,000 mcg by mouth daily.   Yes Historical Provider, MD  colestipol (COLESTID) 1 g tablet Take 2 tablets (2 g total) by mouth 2 (two) times daily. Do not take without two hours of other medications. Patient not taking: Reported on 09/04/2016 07/09/16   Tiffany KocherLeslie S Lewis, PA-C  predniSONE (  DELTASONE) 10 MG tablet 30mg  po daily for 5 days, 20mg  po daily for 5 days, 10mg  po daily for 7 days, 5 mg po daily for 7 days, then stop. Patient not taking: Reported on 09/04/2016 07/20/16   Tiffany Kocher, PA-C    Allergies as of 09/04/2016 - Review Complete 09/04/2016  Allergen Reaction Noted  . Codeine  12/27/2015    Family History  Problem Relation Age of Onset  . Colon cancer Maternal Uncle     Social History   Social History  . Marital status: Widowed    Spouse name: N/A  . Number of children: N/A  . Years of education: N/A    Occupational History  . Not on file.   Social History Main Topics  . Smoking status: Never Smoker  . Smokeless tobacco: Never Used  . Alcohol use No  . Drug use: No  . Sexual activity: Not on file   Other Topics Concern  . Not on file   Social History Narrative  . No narrative on file    Review of Systems: See HPI, otherwise negative ROS  Physical Exam: BP 123/66   Pulse 65   Temp 98.1 F (36.7 C) (Oral)   Ht 5\' 3"  (1.6 m)   Wt 167 lb 6.4 oz (75.9 kg)   BMI 29.65 kg/m  General:   Alert,   well-nourished, pleasant and cooperative in NAD Abdomen: Non-distended, normal bowel sounds.  Soft and nontender without appreciable mass or hepatosplenomegaly.    Impression:  Pleasant 76 year old lady with a history of microscopic colitis response to steroids. Doing well now in remission. Time will tell whether or not it will be long-standing or not. If she has recurrent diarrhea, would consider  prednisone starting at a lower dose and perhaps tapering her very slowly with a lower dose  over a longer time frame(i.e. using the 1 mg prednisone tablets perhaps decreasing over time  -  1 mg increments -  below  5 mg.). Could not afford oral budesonide preparations previously. Cystic right pelvic mass. Should see the gynecologist. Is likely benign. However gynecology consultation recommended. She would like to select a gynecologist in [place]. She states she will let us know and we will make the referral.    GERD doing well on pantoprazole.   Recommendations:Continue Pantoprazole 40 mg daily  See a gynecologist regarding Right pelvic mass  Office visit in 3 months  Call in the interim if diarrhea recurs         Notice: This dictation was prepared with Dragon dictation along with smaller phrase technology. Any transcriptional errors that result from this process are unintentional and may not be corrected upon review.

## 2016-09-04 NOTE — Patient Instructions (Addendum)
Continue Pantoprazole 40 mg daily  See a gynecologist regarding Right pelvic mass  Office visit in 3 months  Call in the interim if diarrhea recurs

## 2016-09-12 ENCOUNTER — Telehealth: Payer: Self-pay

## 2016-09-12 NOTE — Telephone Encounter (Signed)
Pt called to let Dr. Jena Gaussourk know that she has made an appt with GYN @ [facility] . Her appt is 10/12/2016.  She would like to have any records here sent to them that Dr. Jena Gaussourk wants them to have. Their phone number is [phone].  Sending to Dr. Jena Gaussourk and to Darl PikesSusan to send records.

## 2016-09-12 NOTE — Telephone Encounter (Signed)
Need my office note from January 30, MRI and CT reports to [facility]. Actually, would be nice to get  the MRI and a CT on disc so the gynecologist can have for future reference.

## 2016-09-12 NOTE — Telephone Encounter (Signed)
Forwarding to Darl PikesSusan to send records.

## 2016-09-17 NOTE — Telephone Encounter (Signed)
Faxed records to PCP and GYN. Disc being mailed to GYN

## 2016-09-19 DIAGNOSIS — M7672 Peroneal tendinitis, left leg: Secondary | ICD-10-CM | POA: Insufficient documentation

## 2016-09-19 DIAGNOSIS — B351 Tinea unguium: Secondary | ICD-10-CM | POA: Insufficient documentation

## 2016-09-19 DIAGNOSIS — M79673 Pain in unspecified foot: Secondary | ICD-10-CM | POA: Insufficient documentation

## 2016-10-10 ENCOUNTER — Encounter: Payer: Self-pay | Admitting: Internal Medicine

## 2016-11-09 ENCOUNTER — Telehealth: Payer: Self-pay

## 2016-11-09 MED ORDER — PREDNISONE 1 MG PO TABS: ORAL_TABLET | ORAL | 0 refills | Status: DC

## 2016-11-09 NOTE — Telephone Encounter (Signed)
Pt called- left voicemail- she is having diarrhea again and is requesting prednisone sent in to [facility].

## 2016-11-09 NOTE — Telephone Encounter (Signed)
Pt is aware.  

## 2016-11-09 NOTE — Telephone Encounter (Signed)
Per RMR recommendations in last ov, will start low dose prednisone and taper slowly.   Prednisone  tablets to be called in. Take  orally daily for 10 days, then decrease by  every 10 days until off.

## 2016-11-19 ENCOUNTER — Telehealth: Payer: Self-pay | Admitting: Internal Medicine

## 2016-11-19 ENCOUNTER — Encounter (HOSPITAL_COMMUNITY): Payer: Self-pay | Admitting: Emergency Medicine

## 2016-11-19 ENCOUNTER — Emergency Department (HOSPITAL_COMMUNITY)
Admission: EM | Admit: 2016-11-19 | Discharge: 2016-11-19 | Disposition: A | Discharge status: Home / Self Care | Payer: Medicare HMO | Attending: Emergency Medicine | Admitting: Emergency Medicine

## 2016-11-19 DIAGNOSIS — R197 Diarrhea, unspecified: Secondary | ICD-10-CM | POA: Diagnosis present

## 2016-11-19 DIAGNOSIS — Z79899 Other long term (current) drug therapy: Secondary | ICD-10-CM | POA: Insufficient documentation

## 2016-11-19 LAB — COMPREHENSIVE METABOLIC PANEL
ALT: 17 U/L (ref 14–54)
AST: 22 U/L (ref 15–41)
Albumin: 3.7 g/dL (ref 3.5–5.0)
Alkaline Phosphatase: 64 U/L (ref 38–126)
Anion gap: 10 (ref 5–15)
BUN: 20 mg/dL (ref 6–20)
CO2: 27 mmol/L (ref 22–32)
Calcium: 9.7 mg/dL (ref 8.9–10.3)
Chloride: 100 mmol/L — ABNORMAL LOW (ref 101–111)
Creatinine, Ser: 0.98 mg/dL (ref 0.44–1.00)
GFR calc Af Amer: >60 mL/min (ref >60)
GFR calc non Af Amer: 55 mL/min — ABNORMAL LOW (ref >60)
Glucose, Bld: 109 mg/dL — ABNORMAL HIGH (ref 65–99)
Potassium: 3.2 mmol/L — ABNORMAL LOW (ref 3.5–5.1)
Sodium: 137 mmol/L (ref 135–145)
Total Bilirubin: 0.5 mg/dL (ref 0.3–1.2)
Total Protein: 7 g/dL (ref 6.5–8.1)

## 2016-11-19 LAB — URINALYSIS, ROUTINE W REFLEX MICROSCOPIC
Bacteria, UA: NONE SEEN
Bilirubin Urine: NEGATIVE
Glucose, UA: NEGATIVE mg/dL
Hgb urine dipstick: NEGATIVE
Ketones, ur: NEGATIVE mg/dL
Nitrite: NEGATIVE
Protein, ur: 30 mg/dL — AB
Specific Gravity, Urine: 1.015 (ref 1.005–1.030)
pH: 5 (ref 5.0–8.0)

## 2016-11-19 LAB — CBC
HCT: 36.8 % (ref 36.0–46.0)
Hemoglobin: 12.2 g/dL (ref 12.0–15.0)
MCH: 28.7 pg (ref 26.0–34.0)
MCHC: 33.2 g/dL (ref 30.0–36.0)
MCV: 86.6 fL (ref 78.0–100.0)
Platelets: 236 10*3/uL (ref 150–400)
RBC: 4.25 MIL/uL (ref 3.87–5.11)
RDW: 12.9 % (ref 11.5–15.5)
WBC: 7.7 10*3/uL (ref 4.0–10.5)

## 2016-11-19 LAB — LIPASE, BLOOD: Lipase: 29 U/L (ref 11–51)

## 2016-11-19 LAB — C DIFFICILE QUICK SCREEN W PCR REFLEX
C Diff antigen: NEGATIVE
C Diff interpretation: NOT DETECTED
C Diff toxin: NEGATIVE

## 2016-11-19 MED ORDER — POTASSIUM CHLORIDE CRYS ER 20 MEQ PO TBCR
40.0000 meq | EXTENDED_RELEASE_TABLET | Freq: Once | ORAL | Status: AC
  Administered 2016-11-19: 40 meq via ORAL
  Filled 2016-11-19: qty 2

## 2016-11-19 MED ORDER — SODIUM CHLORIDE 0.9 % IV BOLUS (SEPSIS)
1000.0000 mL | Freq: Once | INTRAVENOUS | Status: AC
  Administered 2016-11-19: 1000 mL via INTRAVENOUS

## 2016-11-19 MED ORDER — PREDNISONE 20 MG PO TABS
20.0000 mg | ORAL_TABLET | Freq: Once | ORAL | Status: AC
Start: 2016-11-19 — End: 2016-11-19
  Administered 2016-11-19: 20 mg via ORAL
  Filled 2016-11-19: qty 1

## 2016-11-19 NOTE — ED Provider Notes (Signed)
[facility]-EMER[facility]ENCY DEPT Provider Note   CSN: [id number] Arrival date & time: 11/19/16  1056     History   Chief Complaint Chief Complaint  Patient presents with  . Diarrhea    x 3 weeks    HPI Marie [name] is a 76 y.o. female.  HPI   76yF with diarrhea. Intermittent problem for months. Hx of lymphocytic colitis. Has responded well to steroids previously. Seen by GI physician and started on low dose steroids but symptoms have not improved significantly. No fever or chills. No gross blood. Little pain. No n/v.   Past Medical History:  Diagnosis Date  . Anemia   . Collagenous colitis    colonoscopy 2009  . Depression   . Fibromyalgia   . GERD (gastroesophageal reflux disease)   . Hiatal hernia 2008   small  . Hypercholesterolemia   . Hypertension   . IBS (irritable bowel syndrome)   . Lymphocytic colitis    colonoscopy 2007, TCS 2013  . S/P endoscopy Sept 2008   small hh, s/p 93 French dilator    Patient Active Problem List   Diagnosis Date Noted  . LLQ pain 12/27/2015  . Weight loss 12/17/2011  . Lymphocytic colitis 11/21/2011  . LUQ pain 11/21/2011  . Diarrhea 04/30/2011  . ANEMIA, MILD 08/05/2008  . DEPRESSION 08/05/2008  . HYPERTENSION 08/05/2008  . GERD 08/05/2008  . IRRITABLE BOWEL SYNDROME 08/05/2008  . COLLAGENOUS COLITIS 08/05/2008  . ARTHRITIS 08/05/2008  . FIBROMYALGIA 08/05/2008  . ABDOMINAL BLOATING 08/05/2008  . FECAL INCONTINENCE 08/05/2008  . DIARRHEA, RECURRENT 08/05/2008  . ABDOMINAL PAIN, CHRONIC 08/05/2008  . DIVERTICULOSIS, COLON, HX OF 08/05/2008    Past Surgical History:  Procedure Laterality Date  . ABDOMINAL HYSTERECTOMY    . COLONOSCOPY  04/13/2008   Dr.Rourk- anal papilla and internal hemorrhage otherwise normal /left side diverticula  . COLONOSCOPY  09/20/2011   Dr.Rourk- Adequate preparation. Internal hemorrhoids; otherwise normal/ Left-sided diverticula, biopsies positive for lymphocytic colitis  . ESOPHAGOGASTRODUODENOSCOPY   04/22/2007   Dr.Rourk- normal esophagus, small hiatal hernia o/w normal stomach, D1 and D2 s/p passage of a 56-French maloney dilator  . fistula of colon    . FLEXIBLE SIGMOIDOSCOPY  05/29/2006   Dr.Rehman- No evidence of pseudomembranous or acute colitis.  Marland Kitchen FOOT SURGERY    . KNEE SURGERY    . NOSE SURGERY      OB History    No data available       Home Medications    Prior to Admission medications   Medication Sig Start Date End Date Taking? Authorizing Provider  Ascorbic Acid (VITAMIN C PO) Take 1 tablet by mouth every morning.     Yes Historical Provider, MD  bismuth subsalicylate (PEPTO BISMOL) 262 MG chewable tablet Chew 2-5 tablets by mouth as needed for diarrhea or loose stools.   Yes Historical Provider, MD  CALCIUM PO Take 1 tablet by mouth every morning.     Yes Historical Provider, MD  Cholecalciferol (VITAMIN D3) 1000 UNITS CAPS Take 1,000 Units by mouth daily.   Yes Historical Provider, MD  citalopram (CELEXA) 20 MG tablet Take 20 mg by mouth every morning.    Yes Historical Provider, MD  hydrochlorothiazide (MICROZIDE) 12.5 MG capsule Take 12.5 mg by mouth every morning.    Yes Historical Provider, MD  losartan (COZAAR) 50 MG tablet Take 75 mg by mouth every morning.     Yes Historical Provider, MD  Multiple Vitamins-Minerals (CENTRUM SILVER ULTRA WOMENS PO) Take 1 tablet  by mouth every morning.     Yes Historical Provider, MD  Omega-3 Fatty Acids (FISH OIL) 1000 MG CAPS Take 2 capsules by mouth daily.   Yes Historical Provider, MD  oxybutynin (DITROPAN) 5 MG tablet Take 5 mg by mouth daily.   Yes Historical Provider, MD  pantoprazole (PROTONIX) 40 MG tablet Take 1 tablet (40 mg total) by mouth daily before breakfast. 07/09/16  Yes Tiffany Kocher, PA-C  predniSONE (DELTASONE) 1 MG tablet Take  orally daily for 10 days, then  daily for 10 days,  daily for 10 days,  daily for 10 days,  daily for 10 days. Patient taking differently: Take by mouth See admin  instructions. Take  orally daily for 10 days, then  daily for 10 days,  daily for 10 days,  daily for 10 days,  daily for 10 days. 11/09/16  Yes Tiffany Kocher, PA-C  Probiotic Product (PROBIOTIC DAILY PO) Take 1 capsule by mouth daily.   Yes Historical Provider, MD  terbinafine (LAMISIL) 250 MG tablet Take 250 mg by mouth daily.   Yes Historical Provider, MD  traMADol (ULTRAM) 50 MG tablet Take 50 mg by mouth every 6 (six) hours as needed. For pain   Yes Historical Provider, MD  vitamin B-12 (CYANOCOBALAMIN) 1000 MCG tablet Take 1,000 mcg by mouth daily.   Yes Historical Provider, MD    Family History Family History  Problem Relation Age of Onset  . Colon cancer Maternal Uncle     Social History Social History  Substance Use Topics  . Smoking status: Never Smoker  . Smokeless tobacco: Never Used  . Alcohol use No     Allergies   Codeine   Review of Systems Review of Systems  All systems reviewed and negative, other than as noted in HPI.   Physical Exam Updated Vital Signs BP (!) 154/67 (BP Location: Right Arm)   Pulse 69   Temp 97.6 F (36.4 C) (Oral)   Resp 20   Ht  (1.6 m)   Wt 167 lb (75.8 kg)   SpO2 98%   BMI 29.58 kg/m   Physical Exam  Constitutional: She appears well-developed and well-nourished. No distress.  HENT:  Head: Normocephalic and atraumatic.  Eyes: Conjunctivae are normal. Right eye exhibits no discharge. Left eye exhibits no discharge.  Neck: Neck supple.  Cardiovascular: Normal rate, regular rhythm and normal heart sounds.  Exam reveals no gallop and no friction rub.   No murmur heard. Pulmonary/Chest: Effort normal and breath sounds normal. No respiratory distress.  Abdominal: Soft. She exhibits no distension. There is no tenderness.  Musculoskeletal: She exhibits no edema or tenderness.  Neurological: She is alert.  Skin: Skin is warm and dry.  Psychiatric: She has a normal mood and affect. Her behavior is normal.  Thought content normal.  Nursing note and vitals reviewed.    ED Treatments / Results  Labs (all labs ordered are listed, but only abnormal results are displayed) Labs Reviewed  COMPREHENSIVE METABOLIC PANEL - Abnormal; Notable for the following:       Result Value   Potassium 3.2 (*)    Chloride 100 (*)    Glucose, Bld 109 (*)    GFR calc non Af Amer 55 (*)    All other components within normal limits  URINALYSIS, ROUTINE W REFLEX MICROSCOPIC - Abnormal; Notable for the following:    Color, Urine AMBER (*)    APPearance HAZY (*)    Protein, ur 30 (*)  Leukocytes, UA MODERATE (*)    Squamous Epithelial / LPF 0-5 (*)    All other components within normal limits  C DIFFICILE QUICK SCREEN W PCR REFLEX  LIPASE, BLOOD  CBC    EKG  EKG Interpretation None       Radiology No results found.  Procedures Procedures (including critical care time)  Medications Ordered in ED Medications  potassium chloride SA (K-DUR,KLOR-CON) CR tablet 40 mEq (not administered)  sodium chloride 0.9 % bolus 1,000 mL (not administered)  predniSONE (DELTASONE) tablet 20 mg (not administered)     Initial Impression / Assessment and Plan / ED Course  I have reviewed the triage vital signs and the nursing notes.  Pertinent labs & imaging results that were available during my care of the patient were reviewed by me and considered in my medical decision making (see chart for details).     76 year old female with diarrhea. This has been an intermittent problem for her previously. Her abdominal exam is benign. Apparently she has responded well to steroids previously. There is a note today from Dr. Jena Gauss recommending 20 mg prednisone daily. She was given a dose of that here in the emergency room. She was advised of his recommendations. Her labs are fairly unremarkable. She was given some fluids. I feel she is appropriate for discharge and further outpatient follow-up as needed.  Final Clinical  Impressions(s) / ED Diagnoses   Final diagnoses:  Diarrhea, unspecified type    New Prescriptions New Prescriptions   No medications on file     Raeford Razor, MD 11/29/16 2304

## 2016-11-19 NOTE — Telephone Encounter (Signed)
Probably not on enough prednisone. Hold current prednisone recommendations. Begin prednisone 20 mg daily. Take for the next month. Dispense (30) 20 milligram tablets. Office visit with [place] in 4 weeks. If this doesn't help her diarrhea, further evaluation would be warranted.

## 2016-11-19 NOTE — Telephone Encounter (Signed)
PATIENT HAS DIARRHEA FOR 3 WEEKS, PREDNISONE HAS NOT DONE ANYTHING, THINKS SHE NEEDS TO GO TO THE ER AND GET SOME FLUID [id number]

## 2016-11-19 NOTE — Telephone Encounter (Signed)
Tried to call pt- she was not home. Pt currently in ED.

## 2016-11-19 NOTE — ED Triage Notes (Signed)
Pt reports diarrhea for 3 weeks.  States this has occurred before and usually prednisone takes care of it.  Saw Dr. Benard Rink and he was reluctant to initiate this treatment again so he began low dose prednisone therapy which has not resolved the symptoms.

## 2016-11-19 NOTE — Telephone Encounter (Signed)
Spoke with the pt, she said she has had diarrhea for 3 weeks and has been on the prednisone for a week and the diarrhea is not getting any better. Anything she eats or drinks goes straight thru her. She is not vomiting, no fever. She is having to get up all night long with diarrhea. She is feeling weak and dizzy. I advised the pt that I would send this message to Dr.Rourk and see what he recommends but if she felt like she was dehydrated and needed fluids she should proceed to the ED.

## 2016-11-20 MED ORDER — PREDNISONE 20 MG PO TABS: 20.0000 mg | ORAL_TABLET | Freq: Every day | ORAL | 0 refills | Status: DC

## 2016-11-20 NOTE — Addendum Note (Signed)
Addended by: Myra Rude on: 11/20/2016 10:24 AM   Modules accepted: Orders

## 2016-11-20 NOTE — Telephone Encounter (Signed)
Spoke with the pt, she got fluids and potassium at the ED. She is feeling a little better today, still having diarrhea. Informed her about prednisone. I have sent in rx to [facility]. Pt is already scheduled to come in on 12/21/16 and she is aware of appt date and time.

## 2016-11-27 ENCOUNTER — Encounter: Payer: Self-pay | Admitting: Internal Medicine

## 2016-11-27 ENCOUNTER — Ambulatory Visit (INDEPENDENT_AMBULATORY_CARE_PROVIDER_SITE_OTHER): Payer: Medicare HMO | Admitting: Internal Medicine

## 2016-11-27 ENCOUNTER — Other Ambulatory Visit: Payer: Self-pay

## 2016-11-27 VITALS — BP 124/72 | HR 64 | Temp 98.1°F | Ht 62.0 in | Wt 162.4 lb

## 2016-11-27 DIAGNOSIS — R197 Diarrhea, unspecified: Secondary | ICD-10-CM

## 2016-11-27 NOTE — Progress Notes (Signed)
Primary Care Physician:  Alleen Borne Primary Gastroenterologist:  Dr. Jena Gauss  Pre-Procedure History & Physical: HPI:  Marie [name] is a 76 y.o. female here for further evaluation of recurrent nonbloody diarrhea. 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 Had a relapse and diarrhea first of this year. We saw her and gave her some cortical steroid therapy. Diarrhea resolved several weeks later it recurred. Resumption of prednisone this time, however, has not been associated with improvement in her diarrhea. She presented to the emergency room last week. Given fluids. C. difficile toxin assay negative. Prednisone increased to 20 mg daily still with diarrhea nocturnal component. No associated nausea or vomiting. Does have some anorexia. Subtle segments diarrhea several weeks ago but it resolved. No antibiotics exposure.  Past Medical History:  Diagnosis Date  . Anemia   . Collagenous colitis    colonoscopy 2009  . Depression   . Fibromyalgia   . GERD (gastroesophageal reflux disease)   . Hiatal hernia 2008   small  . Hypercholesterolemia   . Hypertension   . IBS (irritable bowel syndrome)   . Lymphocytic colitis    colonoscopy 2007, TCS 2013  . S/P endoscopy Sept 2008   small hh, s/p 47 French dilator    Past Surgical History:  Procedure Laterality Date  . ABDOMINAL HYSTERECTOMY    . COLONOSCOPY  04/13/2008   Dr.Rourk- anal papilla and internal hemorrhage otherwise normal /left side diverticula  . COLONOSCOPY  09/20/2011   Dr.Rourk- Adequate preparation. Internal hemorrhoids; otherwise normal/ Left-sided diverticula, biopsies positive for lymphocytic colitis  . ESOPHAGOGASTRODUODENOSCOPY  04/22/2007   Dr.Rourk- normal esophagus, small hiatal hernia o/w normal stomach, D1 and D2 s/p passage of a 56-French maloney dilator  . fistula of colon    . FLEXIBLE SIGMOIDOSCOPY  05/29/2006   Dr.Rehman- No evidence of pseudomembranous or acute  colitis.  Marland Kitchen FOOT SURGERY    . KNEE SURGERY    . NOSE SURGERY      Prior to Admission medications   Medication Sig Start Date End Date Taking? Authorizing Provider  Ascorbic Acid (VITAMIN C PO) Take 1 tablet by mouth every morning.     Yes Historical Provider, MD  bismuth subsalicylate (PEPTO BISMOL) 262 MG chewable tablet Chew 2-5 tablets by mouth as needed for diarrhea or loose stools.   Yes Historical Provider, MD  CALCIUM PO Take 1 tablet by mouth every morning.     Yes Historical Provider, MD  Cholecalciferol (VITAMIN D3) 1000 UNITS CAPS Take 1,000 Units by mouth daily.   Yes Historical Provider, MD  citalopram (CELEXA) 20 MG tablet Take 20 mg by mouth every morning.    Yes Historical Provider, MD  hydrochlorothiazide (MICROZIDE) 12.5 MG capsule Take 12.5 mg by mouth every morning.    Yes Historical Provider, MD  losartan (COZAAR) 50 MG tablet Take 75 mg by mouth every morning.     Yes Historical Provider, MD  Multiple Vitamins-Minerals (CENTRUM SILVER ULTRA WOMENS PO) Take 1 tablet by mouth every morning.     Yes Historical Provider, MD  Omega-3 Fatty Acids (FISH OIL) 1000 MG CAPS Take 2 capsules by mouth daily.   Yes Historical Provider, MD  oxybutynin (DITROPAN) 5 MG tablet Take 5 mg by mouth daily.   Yes Historical Provider, MD  pantoprazole (PROTONIX) 40 MG tablet Take 1 tablet (40 mg total) by mouth daily before breakfast. 07/09/16  Yes Tiffany Kocher, PA-C  predniSONE (DELTASONE) 20 MG tablet Take 1 tablet (20 mg  total) by mouth daily with breakfast. 11/20/16  Yes Corbin Ade, MD  Probiotic Product (PROBIOTIC DAILY PO) Take 1 capsule by mouth daily.   Yes Historical Provider, MD  terbinafine (LAMISIL) 250 MG tablet Take 250 mg by mouth daily.   Yes Historical Provider, MD  traMADol (ULTRAM) 50 MG tablet Take 50 mg by mouth every 6 (six) hours as needed. For pain   Yes Historical Provider, MD  vitamin B-12 (CYANOCOBALAMIN) 1000 MCG tablet Take 1,000 mcg by mouth daily.   Yes  Historical Provider, MD  predniSONE (DELTASONE) 1 MG tablet Take  orally daily for 10 days, then  daily for 10 days,  daily for 10 days,  daily for 10 days,  daily for 10 days. Patient not taking: Reported on 11/27/2016 11/09/16   Tiffany Kocher, PA-C    Allergies as of 11/27/2016 - Review Complete 11/27/2016  Allergen Reaction Noted  . Codeine  12/27/2015    Family History  Problem Relation Age of Onset  . Colon cancer Maternal Uncle     Social History   Social History  . Marital status: Widowed    Spouse name: N/A  . Number of children: N/A  . Years of education: N/A   Occupational History  . Not on file.   Social History Main Topics  . Smoking status: Never Smoker  . Smokeless tobacco: Never Used  . Alcohol use No  . Drug use: No  . Sexual activity: Not on file   Other Topics Concern  . Not on file   Social History Narrative  . No narrative on file    Review of Systems: See HPI, otherwise negative ROS  Physical Exam: BP 124/72   Pulse 64   Temp 98.1 F (36.7 C) (Oral)   Ht  (1.575 m)   Wt 162 lb 6.4 oz (73.7 kg)   BMI 29.70 kg/m  General:   Alert,   pleasant and cooperative in NAD Skin:  Intact without significant lesions or rashes. Eyes:  Sclera clear, no icterus.   Conjunctiva pink. Ears:  Normal auditory acuity. Nose:  No deformity, discharge,  or lesions. Mouth:  No deformity or lesions. Neck:  Supple; no masses or thyromegaly. No significant cervical adenopathy. Lungs:  Clear throughout to auscultation.   No wheezes, crackles, or rhonchi. No acute distress. Heart:  Regular rate and rhythm; no murmurs, clicks, rubs,  or gallops. Abdomen: Non-distended, normal bowel sounds.  Soft and nontender without appreciable mass or hepatosplenomegaly.  Pulses:  Normal pulses noted. Extremities:  Without clubbing or edema.  Impression:  Recurrent nonbloody diarrhea in the setting of a known history of collagenous microscopic colitis. Has  responded to corticosteroids earlier in the year now with relapse. ED visit last week. She does not appear acutely ill or toxic at this time. This can certainly just could be a recalcitrant relapse of microscopic colitis. However, other possibilities need to be In mind. C. difficile negative last week.  Think that needs to be repeated with a GI pathogen panel.  No need for repeat colonoscopy at this time the plans could change.  Recommendations:  Send stool for GI path panel and repeat Cdiff  Avoid dehydration  Use Imodium as needed for diarrhea  Increase Prednisone to 40 mg daily for the time being. Further dosing adjustment may be needed in the near future.  Further recommendations   Information on diarrhea managemenent provided  Further recommendations to follow  Ov in 4 weeks  Notice: This dictation was prepared with Dragon dictation along with smaller phrase technology. Any transcriptional errors that result from this process are unintentional and may not be corrected upon review.

## 2016-11-27 NOTE — Patient Instructions (Signed)
Send stool for GI path panel and repeat Cdiff  Avoid dehydration  Use Imodium as needed for diarrhea  Increase Prednisone to 40 mg daily  Further recommendations   Information on diarrhea managemenent provided  Further recommendations to follow  Ov in 4 weeks

## 2016-11-29 LAB — GASTROINTESTINAL PATHOGEN PANEL PCR
C. difficile Tox A/B, PCR: NOT DETECTED
Campylobacter, PCR: NOT DETECTED
Cryptosporidium, PCR: NOT DETECTED
E coli (ETEC) LT/ST PCR: NOT DETECTED
E coli (STEC) stx1/stx2, PCR: NOT DETECTED
E coli 0157, PCR: NOT DETECTED
Giardia lamblia, PCR: NOT DETECTED
Norovirus, PCR: NOT DETECTED
Rotavirus A, PCR: NOT DETECTED
Salmonella, PCR: NOT DETECTED
Shigella, PCR: NOT DETECTED

## 2016-11-29 LAB — CLOSTRIDIUM DIFFICILE BY PCR: Toxigenic C. Difficile by PCR: NOT DETECTED

## 2016-11-30 ENCOUNTER — Other Ambulatory Visit: Payer: Self-pay | Admitting: Internal Medicine

## 2016-11-30 MED ORDER — PREDNISONE 10 MG PO TABS: ORAL_TABLET | ORAL | 1 refills | Status: DC

## 2016-11-30 NOTE — Progress Notes (Signed)
PATIENT SCHEDULED FOR FOLLOW UP OFFICE VISIT  °

## 2016-12-21 ENCOUNTER — Ambulatory Visit: Payer: Medicare HMO | Admitting: Gastroenterology

## 2017-01-01 ENCOUNTER — Emergency Department (HOSPITAL_COMMUNITY)
Admission: EM | Admit: 2017-01-01 | Discharge: 2017-01-02 | Disposition: A | Discharge status: Home / Self Care | Payer: Medicare HMO | Attending: Emergency Medicine | Admitting: Emergency Medicine

## 2017-01-01 ENCOUNTER — Encounter (HOSPITAL_COMMUNITY): Payer: Self-pay | Admitting: Emergency Medicine

## 2017-01-01 ENCOUNTER — Encounter: Payer: Self-pay | Admitting: Internal Medicine

## 2017-01-01 ENCOUNTER — Ambulatory Visit (INDEPENDENT_AMBULATORY_CARE_PROVIDER_SITE_OTHER): Payer: Medicare HMO | Admitting: Internal Medicine

## 2017-01-01 VITALS — BP 129/71 | HR 68 | Temp 97.6°F | Ht 63.0 in | Wt 160.0 lb

## 2017-01-01 DIAGNOSIS — R197 Diarrhea, unspecified: Secondary | ICD-10-CM | POA: Diagnosis present

## 2017-01-01 DIAGNOSIS — K219 Gastro-esophageal reflux disease without esophagitis: Secondary | ICD-10-CM | POA: Diagnosis not present

## 2017-01-01 DIAGNOSIS — E860 Dehydration: Secondary | ICD-10-CM | POA: Insufficient documentation

## 2017-01-01 DIAGNOSIS — Z79899 Other long term (current) drug therapy: Secondary | ICD-10-CM | POA: Insufficient documentation

## 2017-01-01 DIAGNOSIS — I10 Essential (primary) hypertension: Secondary | ICD-10-CM | POA: Insufficient documentation

## 2017-01-01 DIAGNOSIS — E876 Hypokalemia: Secondary | ICD-10-CM | POA: Insufficient documentation

## 2017-01-01 LAB — BASIC METABOLIC PANEL
Anion gap: 11 (ref 5–15)
BUN: 26 mg/dL — ABNORMAL HIGH (ref 6–20)
CO2: 28 mmol/L (ref 22–32)
Calcium: 9.5 mg/dL (ref 8.9–10.3)
Chloride: 97 mmol/L — ABNORMAL LOW (ref 101–111)
Creatinine, Ser: 1.11 mg/dL — ABNORMAL HIGH (ref 0.44–1.00)
GFR calc Af Amer: 55 mL/min — ABNORMAL LOW (ref >60)
GFR calc non Af Amer: 48 mL/min — ABNORMAL LOW (ref >60)
Glucose, Bld: 136 mg/dL — ABNORMAL HIGH (ref 65–99)
Potassium: 3 mmol/L — ABNORMAL LOW (ref 3.5–5.1)
Sodium: 136 mmol/L (ref 135–145)

## 2017-01-01 LAB — CBC WITH DIFFERENTIAL/PLATELET
Basophils Absolute: 0 10*3/uL (ref 0.0–0.1)
Basophils Relative: 0 %
Eosinophils Absolute: 0 10*3/uL (ref 0.0–0.7)
Eosinophils Relative: 0 %
HCT: 35.3 % — ABNORMAL LOW (ref 36.0–46.0)
Hemoglobin: 11.6 g/dL — ABNORMAL LOW (ref 12.0–15.0)
Lymphocytes Relative: 11 %
Lymphs Abs: 0.8 10*3/uL (ref 0.7–4.0)
MCH: 29 pg (ref 26.0–34.0)
MCHC: 32.9 g/dL (ref 30.0–36.0)
MCV: 88.3 fL (ref 78.0–100.0)
Monocytes Absolute: 0.2 10*3/uL (ref 0.1–1.0)
Monocytes Relative: 3 %
Neutro Abs: 6.3 10*3/uL (ref 1.7–7.7)
Neutrophils Relative %: 86 %
Platelets: 226 10*3/uL (ref 150–400)
RBC: 4 MIL/uL (ref 3.87–5.11)
RDW: 13.4 % (ref 11.5–15.5)
WBC: 7.4 10*3/uL (ref 4.0–10.5)

## 2017-01-01 LAB — HEPATIC FUNCTION PANEL
ALT: 18 U/L (ref 14–54)
AST: 22 U/L (ref 15–41)
Albumin: 3.5 g/dL (ref 3.5–5.0)
Alkaline Phosphatase: 44 U/L (ref 38–126)
Bilirubin, Direct: 0.1 mg/dL (ref 0.1–0.5)
Indirect Bilirubin: 0.5 mg/dL (ref 0.3–0.9)
Total Bilirubin: 0.6 mg/dL (ref 0.3–1.2)
Total Protein: 6.3 g/dL — ABNORMAL LOW (ref 6.5–8.1)

## 2017-01-01 LAB — MAGNESIUM: Magnesium: 1.7 mg/dL (ref 1.7–2.4)

## 2017-01-01 MED ORDER — SODIUM CHLORIDE 0.9 % IV BOLUS (SEPSIS)
500.0000 mL | Freq: Once | INTRAVENOUS | Status: AC
  Administered 2017-01-01: 500 mL via INTRAVENOUS

## 2017-01-01 MED ORDER — POTASSIUM CHLORIDE 10 MEQ/100ML IV SOLN
10.0000 meq | INTRAVENOUS | Status: AC
  Administered 2017-01-01 (×2): 10 meq via INTRAVENOUS
  Filled 2017-01-01 (×2): qty 100

## 2017-01-01 MED ORDER — POTASSIUM CHLORIDE CRYS ER 20 MEQ PO TBCR
40.0000 meq | EXTENDED_RELEASE_TABLET | Freq: Once | ORAL | Status: AC
  Administered 2017-01-01: 40 meq via ORAL
  Filled 2017-01-01: qty 2

## 2017-01-01 NOTE — ED Notes (Signed)
EKG done and seen by Dr Ranae PalmsYelverton

## 2017-01-01 NOTE — ED Provider Notes (Signed)
[facility] DEPT Provider Note   CSN: [id number] Arrival date & time: 01/01/17  1618     History   Chief Complaint Chief Complaint  Patient presents with  . Diarrhea    HPI Marie [name] is a 75 y.o. female.  HPI Patient referred from Dr. Roseanne Kaufman office for diarrhea. She's had on and off diarrhea for the past year. As been diagnosed with microscopic colitis. Is currently on prednisone taper with little improvement of her diarrhea. States she's having 6-8 watery bowel movements daily. Mild abdominal cramping. Denies any fever chills. No nausea or vomiting. Denies any blood in her stool. Finish course of antibiotics roughly one month ago for urinary tract infection. This had decreased appetite and generalized weakness. Referred from the office for IV fluids. Also recommended repeat C. difficile PCR. Past Medical History:  Diagnosis Date  . Anemia   . Collagenous colitis    colonoscopy 2009  . Depression   . Fibromyalgia   . GERD (gastroesophageal reflux disease)   . Hiatal hernia 2008   small  . Hypercholesterolemia   . Hypertension   . IBS (irritable bowel syndrome)   . Lymphocytic colitis    colonoscopy 2007, TCS 2013  . S/P endoscopy Sept 2008   small hh, s/p 59 French dilator    Patient Active Problem List   Diagnosis Date Noted  . Dehydration 01/04/2017  . LLQ pain 12/27/2015  . Weight loss 12/17/2011  . Lymphocytic colitis 11/21/2011  . LUQ pain 11/21/2011  . Diarrhea 04/30/2011  . ANEMIA, MILD 08/05/2008  . DEPRESSION 08/05/2008  . HYPERTENSION 08/05/2008  . GERD 08/05/2008  . IRRITABLE BOWEL SYNDROME 08/05/2008  . COLLAGENOUS COLITIS 08/05/2008  . ARTHRITIS 08/05/2008  . FIBROMYALGIA 08/05/2008  . ABDOMINAL BLOATING 08/05/2008  . FECAL INCONTINENCE 08/05/2008  . DIARRHEA, RECURRENT 08/05/2008  . ABDOMINAL PAIN, CHRONIC 08/05/2008  . DIVERTICULOSIS, COLON, HX OF 08/05/2008    Past Surgical History:  Procedure Laterality Date  . ABDOMINAL  HYSTERECTOMY    . COLONOSCOPY  04/13/2008   Dr.Rourk- anal papilla and internal hemorrhage otherwise normal /left side diverticula  . COLONOSCOPY  09/20/2011   Dr.Rourk- Adequate preparation. Internal hemorrhoids; otherwise normal/ Left-sided diverticula, biopsies positive for lymphocytic colitis  . ESOPHAGOGASTRODUODENOSCOPY  04/22/2007   Dr.Rourk- normal esophagus, small hiatal hernia o/w normal stomach, D1 and D2 s/p passage of a 56-French maloney dilator  . fistula of colon    . FLEXIBLE SIGMOIDOSCOPY  05/29/2006   Dr.Rehman- No evidence of pseudomembranous or acute colitis.  Marland Kitchen FOOT SURGERY    . KNEE SURGERY    . NOSE SURGERY      OB History    No data available       Home Medications    Prior to Admission medications   Medication Sig Start Date End Date Taking? Authorizing Provider  Ascorbic Acid (VITAMIN C PO) Take 1 tablet by mouth every morning.     Yes [provider]  bismuth subsalicylate (PEPTO BISMOL) 262 MG chewable tablet Chew 2-5 tablets by mouth as needed for diarrhea or loose stools.   Yes [provider]  CALCIUM PO Take 1 tablet by mouth every morning.     Yes [provider]  Cholecalciferol (VITAMIN D3) 1000 UNITS CAPS Take 1,000 Units by mouth daily.   Yes [provider]  citalopram (CELEXA) 20 MG tablet Take 20 mg by mouth every morning.    Yes [provider]  hydrochlorothiazide (MICROZIDE) 12.5 MG capsule Take 12.5 mg by  mouth every morning.    Yes [provider]  losartan (COZAAR) 50 MG tablet Take 50 mg by mouth every morning.    Yes [provider]  Multiple Vitamins-Minerals (CENTRUM SILVER ULTRA WOMENS PO) Take 1 tablet by mouth every morning.     Yes [provider]  Omega-3 Fatty Acids (FISH OIL) 1000 MG CAPS Take 2 capsules by mouth daily.   Yes [provider]  oxybutynin (DITROPAN) 5 MG tablet Take 5 mg by mouth daily.   Yes [provider]  pantoprazole  (PROTONIX) 40 MG tablet Take 1 tablet (40 mg total) by mouth daily before breakfast. 07/09/16  Yes Mahala Menghini, PA-C  predniSONE (DELTASONE) 10 MG tablet Take as directed. Patient taking differently: Take 20 mg by mouth daily. Taking 20 mg daily now 11/30/16  Yes Rourk, Cristopher Estimable, MD  Probiotic Product (PROBIOTIC DAILY PO) Take 1 capsule by mouth daily.   Yes [provider]  traMADol (ULTRAM) 50 MG tablet Take 50 mg by mouth every 6 (six) hours as needed. For pain   Yes [provider]  vitamin B-12 (CYANOCOBALAMIN) 1000 MCG tablet Take 1,000 mcg by mouth daily.   Yes [provider]  dicyclomine (BENTYL) 10 MG capsule Take 1 capsule (10 mg total) by mouth 4 (four) times daily -  before meals and at bedtime. 01/04/17   Carlis Stable, NP  Na Sulfate-K Sulfate-Mg Sulf (SUPREP BOWEL PREP KIT) 17.5-3.13-1.6 GM/180ML SOLN Take 1 kit by mouth as directed. 01/04/17   Rourk, Cristopher Estimable, MD  potassium chloride SA (K-DUR,KLOR-CON) 20 MEQ tablet Take 2 tablets (40 mEq total) by mouth daily. 01/02/17   Julianne Rice, MD    Family History Family History  Problem Relation Age of Onset  . Colon cancer Maternal Uncle   . Colon cancer Maternal Grandmother     Social History Social History  Substance Use Topics  . Smoking status: Never Smoker  . Smokeless tobacco: Never Used  . Alcohol use No     Allergies   Codeine   Review of Systems Review of Systems  Constitutional: Positive for activity change, appetite change and fatigue. Negative for chills and fever.  HENT: Negative for trouble swallowing.   Respiratory: Negative for cough, shortness of breath and wheezing.   Cardiovascular: Negative for chest pain, palpitations and leg swelling.  Gastrointestinal: Positive for diarrhea. Negative for abdominal pain, blood in stool, constipation, nausea and vomiting.  Genitourinary: Negative for dysuria, flank pain, frequency, hematuria and pelvic pain.  Musculoskeletal: Negative  for back pain, myalgias, neck pain and neck stiffness.  Skin: Negative for rash and wound.  Neurological: Positive for dizziness and light-headedness. Negative for syncope, weakness, numbness and headaches.  All other systems reviewed and are negative.    Physical Exam Updated Vital Signs BP 137/73   Pulse 67   Temp 98 F (36.7 C) (Oral)   Resp 11   SpO2 96%   Physical Exam  Constitutional: She is oriented to person, place, and time. She appears well-developed and well-nourished.  HENT:  Head: Normocephalic and atraumatic.  Mouth/Throat: Oropharynx is clear and moist.  Eyes: EOM are normal. Pupils are equal, round, and reactive to light.  Neck: Normal range of motion. Neck supple.  Cardiovascular: Normal rate and regular rhythm.  Exam reveals no gallop and no friction rub.   No murmur heard. Pulmonary/Chest: Effort normal and breath sounds normal. No respiratory distress. She has no wheezes. She has no rales. She exhibits no  tenderness.  Abdominal: Soft. Bowel sounds are normal. There is no tenderness. There is no rebound and no guarding.  Musculoskeletal: Normal range of motion. She exhibits no edema or tenderness.  Neurological: She is alert and oriented to person, place, and time.  Moves all extremities without focal deficit. Sensation fully intact.  Skin: Skin is warm and dry. Capillary refill takes less than 2 seconds. No rash noted. No erythema.  Psychiatric: She has a normal mood and affect. Her behavior is normal.  Nursing note and vitals reviewed.    ED Treatments / Results  Labs (all labs ordered are listed, but only abnormal results are displayed) Labs Reviewed  CBC WITH DIFFERENTIAL/PLATELET - Abnormal; Notable for the following:       Result Value   Hemoglobin 11.6 (*)    HCT 35.3 (*)    All other components within normal limits  BASIC METABOLIC PANEL - Abnormal; Notable for the following:    Potassium 3.0 (*)    Chloride 97 (*)    Glucose, Bld 136 (*)     BUN 26 (*)    Creatinine, Ser 1.11 (*)    GFR calc non Af Amer 48 (*)    GFR calc Af Amer 55 (*)    All other components within normal limits  HEPATIC FUNCTION PANEL - Abnormal; Notable for the following:    Total Protein 6.3 (*)    All other components within normal limits  C DIFFICILE QUICK SCREEN W PCR REFLEX  MAGNESIUM    EKG  EKG Interpretation  Date/Time:  Tuesday Jan 01 2017 20:30:00 EDT Ventricular Rate:  58 PR Interval:    QRS Duration: 89 QT Interval:  445 QTC Calculation: 438 R Axis:   33 Text Interpretation:  Sinus rhythm Atrial premature complex No previous ECGs available Confirmed by Wandra Arthurs [phone]) on 01/02/2017 10:29:14 PM       Radiology No results found.  Procedures Procedures (including critical care time)  Medications Ordered in ED Medications  sodium chloride 0.9 % bolus 500 mL (0 mLs Intravenous Stopped 01/01/17 2242)  potassium chloride 10 mEq in 100 mL IVPB (0 mEq Intravenous Stopped 01/01/17 2251)  potassium chloride SA (K-DUR,KLOR-CON) CR tablet 40 mEq (40 mEq Oral Given 01/01/17 1956)  sodium chloride 0.9 % bolus 500 mL (0 mLs Intravenous Stopped 01/02/17 0100)     Initial Impression / Assessment and Plan / ED Course  I have reviewed the triage vital signs and the nursing notes.  Pertinent labs & imaging results that were available during my care of the patient were reviewed by me and considered in my medical decision making (see chart for details).     Patient states she is feeling much better after IV fluids and potassium replacement. Oral intake without difficulty. Advised follow-up with her gastroenterologist. Return precautions given.  Final Clinical Impressions(s) / ED Diagnoses   Final diagnoses:  Diarrhea, unspecified type  Dehydration  Hypokalemia    New Prescriptions Discharge Medication List as of 01/02/2017 12:29 AM    START taking these medications   Details  potassium chloride SA (K-DUR,KLOR-CON) 20 MEQ tablet  Take 2 tablets (40 mEq total) by mouth daily., Starting Wed 01/02/2017, Print         Julianne Rice, MD 01/04/17 1326

## 2017-01-01 NOTE — Progress Notes (Signed)
Primary Care Physician:  Claggett, Autumn PattyElin, PA-C Primary Gastroenterologist:  Dr. Jena Gaussourk  Pre-Procedure History & Physical: HPI:  Marie [name] is a 76 y.o. female here for a follow-up watery diarrhea. Has had symptoms on and off the better part of this year. Well documented history of microscopic colitis. Cannot afford Entocort.  Gastrointestinal pathogen panel and C. difficile negative prior to starting prednisone 40 mg daily last month and she is on a slow taper. Currently taking prednisone 20 mg daily. She states prednisone has made no difference in her symptoms of diarrhea;  6-8 watery bowel movements a day and night. Some episodes of incontinence. No fever, chills, no nausea or vomiting. Felt she is dehydrated.  Of note, she took a week course of antibiotics one month ago for urinary tract infection.  Her reflux symptoms are well controlled on Protonix 40 mg daily.  Past Medical History:  Diagnosis Date  . Anemia   . Collagenous colitis    colonoscopy 2009  . Depression   . Fibromyalgia   . GERD (gastroesophageal reflux disease)   . Hiatal hernia 2008   small  . Hypercholesterolemia   . Hypertension   . IBS (irritable bowel syndrome)   . Lymphocytic colitis    colonoscopy 2007, TCS 2013  . S/P endoscopy Sept 2008   small hh, s/p 2256 French dilator    Past Surgical History:  Procedure Laterality Date  . ABDOMINAL HYSTERECTOMY    . COLONOSCOPY  04/13/2008   Dr.Rourk- anal papilla and internal hemorrhage otherwise normal /left side diverticula  . COLONOSCOPY  09/20/2011   Dr.Rourk- Adequate preparation. Internal hemorrhoids; otherwise normal/ Left-sided diverticula, biopsies positive for lymphocytic colitis  . ESOPHAGOGASTRODUODENOSCOPY  04/22/2007   Dr.Rourk- normal esophagus, small hiatal hernia o/w normal stomach, D1 and D2 s/p passage of a 56-French maloney dilator  . fistula of colon    . FLEXIBLE SIGMOIDOSCOPY  05/29/2006   Dr.Rehman- No evidence of pseudomembranous or  acute colitis.  Marland Kitchen. FOOT SURGERY    . KNEE SURGERY    . NOSE SURGERY      Prior to Admission medications   Medication Sig Start Date End Date Taking? Authorizing Provider  Ascorbic Acid (VITAMIN C PO) Take 1 tablet by mouth every morning.     Yes [provider]  bismuth subsalicylate (PEPTO BISMOL) 262 MG chewable tablet Chew 2-5 tablets by mouth as needed for diarrhea or loose stools.   Yes [provider]  CALCIUM PO Take 1 tablet by mouth every morning.     Yes [provider]  Cholecalciferol (VITAMIN D3) 1000 UNITS CAPS Take 1,000 Units by mouth daily.   Yes [provider]  citalopram (CELEXA) 20 MG tablet Take 20 mg by mouth every morning.    Yes [provider]  hydrochlorothiazide (MICROZIDE) 12.5 MG capsule Take 12.5 mg by mouth every morning.    Yes [provider]  losartan (COZAAR) 50 MG tablet Take 75 mg by mouth every morning.     Yes [provider]  Multiple Vitamins-Minerals (CENTRUM SILVER ULTRA WOMENS PO) Take 1 tablet by mouth every morning.     Yes [provider]  Omega-3 Fatty Acids (FISH OIL) 1000 MG CAPS Take 2 capsules by mouth daily.   Yes [provider]  oxybutynin (DITROPAN) 5 MG tablet Take 5 mg by mouth daily.   Yes [provider]  pantoprazole (PROTONIX) 40 MG tablet Take 1 tablet (40 mg total) by mouth daily before breakfast.  07/09/16  Yes Tiffany Kocher, PA-C  predniSONE (DELTASONE) 10 MG tablet Take as directed. Patient taking differently: Taking 20 mg daily now 11/30/16  Yes Rourk, Gerrit Friends, MD  Probiotic Product (PROBIOTIC DAILY PO) Take 1 capsule by mouth daily.   Yes [provider]  traMADol (ULTRAM) 50 MG tablet Take 50 mg by mouth every 6 (six) hours as needed. For pain   Yes [provider]  vitamin B-12 (CYANOCOBALAMIN) 1000 MCG tablet Take 1,000 mcg by mouth daily.   Yes [provider]  predniSONE (DELTASONE) 1 MG tablet Take 5mg   orally daily for 10 days, then 4mg  daily for 10 days, 3mg  daily for 10 days, 2mg  daily for 10 days, 1mg  daily for 10 days. Patient not taking: Reported on 11/27/2016 11/09/16   Tiffany Kocher, PA-C  predniSONE (DELTASONE) 20 MG tablet Take 1 tablet (20 mg total) by mouth daily with breakfast. Patient not taking: Reported on 01/01/2017 11/20/16   Corbin Ade, MD  terbinafine (LAMISIL) 250 MG tablet Take 250 mg by mouth daily.    [provider]    Allergies as of 01/01/2017 - Review Complete 01/01/2017  Allergen Reaction Noted  . Codeine  12/27/2015    Family History  Problem Relation Age of Onset  . Colon cancer Maternal Uncle     Social History   Social History  . Marital status: Widowed    Spouse name: N/A  . Number of children: N/A  . Years of education: N/A   Occupational History  . Not on file.   Social History Main Topics  . Smoking status: Never Smoker  . Smokeless tobacco: Never Used  . Alcohol use No  . Drug use: No  . Sexual activity: Not on file   Other Topics Concern  . Not on file   Social History Narrative  . No narrative on file    Review of Systems: See HPI, otherwise negative ROS  Physical Exam: BP 129/71   Pulse 68   Temp 97.6 F (36.4 C) (Oral)   Ht 5\' 3"  (1.6 m)   Wt 160 lb (72.6 kg)   BMI 28.34 kg/m  General:   Alert,  Somewhat ill-appearing lady   pleasant and cooperative in NAD Neck:  Supple; no masses or thyromegaly. No significant cervical adenopathy. Lungs:  Clear throughout to auscultation.   No wheezes, crackles, or rhonchi. No acute distress. Heart:  Regular rate and rhythm; no murmurs, clicks, rubs,  or gallops. Abdomen: Non-distended, normal bowel sounds.  Soft and nontender without appreciable mass or hepatosplenomegaly.  Pulses:  Normal pulses noted. Extremities:  Without clubbing or edema.  Impression:  Several week history of non-bloody watery diarrhea in a setting of known microscopic colitis. This lady has not  improved on corticosteroid therapy with prednisone. I agree with her, I think that she has gotten dehydrated and probably needs some fluids acutely along with some blood work. GI pathogen panel and C. difficile negative prior beginning prednisone. Since that time, she has taken a course of antibiotics.  Recommendations:  You should go to the emergency room for further evaluation  We have called the emergency room to let them know you're coming  You should have a repeat stool study for C. difficile toxin  I recommend you continue a prednisone taper as follows:  Take prednisone 20 mg daily for 7 more days then drop back to 10 mg daily for 7 days and then stop  Continue Protonix 40 mg daily.  Further recommendations to follow.      Notice: This dictation was prepared with Dragon dictation along with smaller phrase technology. Any transcriptional errors that result from this process are unintentional and may not be corrected upon review.

## 2017-01-01 NOTE — ED Notes (Signed)
Patient requesting Saltine crackers, EDP fine with that. Patient given couple packs of crackers.

## 2017-01-01 NOTE — Patient Instructions (Signed)
You should go to the emergency room for further evaluation  We have called the emergency room to let them know you're coming  You should have a repeat stool study for C. difficile toxin  Take prednisone 20 mg daily for 7 more days then drop back to 10 mg daily for 7 days and then stop  Further recommendations to follow.

## 2017-01-01 NOTE — ED Triage Notes (Signed)
Sent from dr Jena Gaussrourk due to diarrhea and loss of appetite x 2 months. Pt states has hx of abd problems. Dr Jena Gaussrourk office called and wanted fluids due to dehydration. Pt mm moist. Denies n/v. Nad.

## 2017-01-01 NOTE — ED Notes (Signed)
Patient denies having any pain at this time. States that she feels weak and dizzy.

## 2017-01-02 LAB — C DIFFICILE QUICK SCREEN W PCR REFLEX
C Diff antigen: NEGATIVE
C Diff interpretation: NOT DETECTED
C Diff toxin: NEGATIVE

## 2017-01-02 MED ORDER — POTASSIUM CHLORIDE CRYS ER 20 MEQ PO TBCR: 40.0000 meq | EXTENDED_RELEASE_TABLET | Freq: Every day | ORAL | 0 refills | Status: DC

## 2017-01-03 ENCOUNTER — Telehealth: Payer: Self-pay | Admitting: Internal Medicine

## 2017-01-03 ENCOUNTER — Telehealth: Payer: Self-pay

## 2017-01-03 NOTE — Telephone Encounter (Signed)
This lady is not flying. Recent office visit ED visit. Issues with refractory diarrhea have not been sorted out. She likely has low magnesium as was low potassium. She may need another colonoscopy.  This lady's needs to be turned back around and needs to be brought back to the office between now the first of the week. To see an extender to expedite evaluation.  Needs repeat labs.

## 2017-01-03 NOTE — Telephone Encounter (Signed)
CALLED PATIENT, COMING 01/10/17

## 2017-01-03 NOTE — Telephone Encounter (Signed)
Correction, pt will be coming in tomorrow to see EG.  Misty StanleyStacey spoke with the pt and she is aware of appt date and time.

## 2017-01-03 NOTE — Telephone Encounter (Signed)
Pt called- she saw RMR Tuesday and he sent her to the ED for IV fluids. Pt got IV fluids and potassium and was sent home. Pt had watery, yellow diarrhea all day yesterday, she said it was about every 2 hours. No vomiting, pt is drinking ok but doesn't feel like eating much. If she does eat, she has to run straight to the bathroom. cdiff was negative at the hospital.  Pt is concerned and is not feeling good. Wants to know if there is anything RMR can do to help her?

## 2017-01-03 NOTE — Telephone Encounter (Signed)
Marie [name], please schedule pt and let her know.

## 2017-01-04 ENCOUNTER — Other Ambulatory Visit: Payer: Self-pay

## 2017-01-04 ENCOUNTER — Ambulatory Visit (INDEPENDENT_AMBULATORY_CARE_PROVIDER_SITE_OTHER): Payer: Medicare HMO | Admitting: Nurse Practitioner

## 2017-01-04 ENCOUNTER — Encounter: Payer: Self-pay | Admitting: Nurse Practitioner

## 2017-01-04 VITALS — BP 126/74 | HR 92 | Temp 97.3°F | Ht 63.0 in | Wt 160.2 lb

## 2017-01-04 DIAGNOSIS — R197 Diarrhea, unspecified: Secondary | ICD-10-CM

## 2017-01-04 DIAGNOSIS — E860 Dehydration: Secondary | ICD-10-CM

## 2017-01-04 DIAGNOSIS — K52832 Lymphocytic colitis: Secondary | ICD-10-CM

## 2017-01-04 LAB — BASIC METABOLIC PANEL
BUN: 17 mg/dL (ref 7–25)
CO2: 28 mmol/L (ref 20–31)
Calcium: 9.6 mg/dL (ref 8.6–10.4)
Chloride: 101 mmol/L (ref 98–110)
Creat: 0.97 mg/dL — ABNORMAL HIGH (ref 0.60–0.93)
Glucose, Bld: 107 mg/dL — ABNORMAL HIGH (ref 65–99)
Potassium: 3.3 mmol/L — ABNORMAL LOW (ref 3.5–5.3)
Sodium: 140 mmol/L (ref 135–146)

## 2017-01-04 MED ORDER — NA SULFATE-K SULFATE-MG SULF 17.5-3.13-1.6 GM/177ML PO SOLN: 1.0000 | ORAL | 0 refills | Status: DC

## 2017-01-04 MED ORDER — DICYCLOMINE HCL 10 MG PO CAPS: 10.0000 mg | ORAL_CAPSULE | Freq: Three times a day (TID) | ORAL | 0 refills | Status: DC

## 2017-01-04 NOTE — Assessment & Plan Note (Signed)
The patient has a history of microscopic colitis and has had worsening diarrhea lately. However, in a Court did not help her symptoms and neither did a prednisone taper. She is still on prednisone and still having diarrhea today. I will send in a prescription of Bentyl to her pharmacy to see if this helps her symptomatically. Imodium and Pepto-Bismol has not helped. Encouraged her to push fluids to prevent dehydration, bland diet. Return for follow-up in 4 weeks or sooner based on postprocedure recommendations.

## 2017-01-04 NOTE — Assessment & Plan Note (Signed)
In the emergency room the patient was noted to be dehydrated with abnormal electrolytes. She was given IV fluids and potassium replacement. Given her persistent diarrhea I will recheck her basic metabolic profile as well as her magnesium. Further management of diarrhea as per below. Return for follow-up in 4 weeks or sooner, based on postprocedure recommendations.

## 2017-01-04 NOTE — Assessment & Plan Note (Signed)
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 She has a history of dehydration a few days ago when she was referred from our office to the emergency department. I'll recheck her labs including basic metabolic profile and magnesium, Bentyl for symptomatic management attempt, further evaluate her refractory diarrhea with a colonoscopy as soon as possible. The patient is agreeable to this. I have also reviewed ER precautions with her for worsening symptoms. Return for follow-up in 4 weeks or sooner based on postprocedure recommendations.  Proceed with TCS with Dr. Jena Gaussourk in near future: the risks, benefits, and alternatives have been discussed with the patient in detail. The patient states understanding and desires to proceed.  The patient is currently on Ultram. No other anticoagulants, anxiolytics, chronic pain medications, or antidepressants. Conscious sedation should be adequate for her procedure as it was for her last

## 2017-01-04 NOTE — Patient Instructions (Signed)
1. Have your labs drawn today. 2. I sent him Bentyl to your pharmacy. You can take this up to 4 times a day as needed for diarrhea and/or abdominal pain. 3. We will schedule your procedure for you. 4. Return for follow-up in 4 weeks, or sooner based on recommendations made after your procedure. 5. As we discussed if you have any worsening or severe symptoms you can call our office or proceed to the emergency room if we are closed.

## 2017-01-04 NOTE — Progress Notes (Signed)
Referring Provider: Claggett, Autumn PattyElin, PA-C Primary Care Physician:  Claggett, Autumn PattyElin, PA-C Primary GI:  Dr. Jena Gaussourk  Chief Complaint  Patient presents with  . Diarrhea    HPI:   Marie [name] is a 76 y.o. female who presents For further evaluation of diarrhea. The patient was last seen in our office 01/01/2017 with noted symptoms on and off for the better part of this year and well-documented history of microscopic colitis, cannot afford Entocort. GI pathogen panel and C. difficile negative prior to starting prednisone 40 mg daily last month and is on a slow taper at that time. Notes the prednisone made no difference in her symptoms, having 6-8 watery bowel movements a day and night with some incontinence. Was recently on antibiotics for urinary tract infection. She felt she was dehydrated. Recommended proceed to the emergency room, repeat stool study for C. difficile toxin, continue prednisone 20 mg daily for 7 more days then decrease to 10 mg daily for 7 days, then stop. Continue Protonix.  She was seen in the emergency department on 01/01/2017 for diarrhea as recommended. C. difficile quick scan was negative. Her creatinine was elevated at 1.11, low potassium. CBC found hemoglobin of 11.6 which is within her 5 year range/baseline. She was given IV fluids and potassium and sent home.   She called our office yesterday saying she had diarrhea all day yesterday about every 2 hours, no vomiting, drinking adequately been doesn't feel like eating much with watery stools with any oral intake of food. Recommended rapid return office visit for refractory diarrhea and may need another colonoscopy. Also recommended repeat labs.  Today she states she's not feeling well. After ER evaluation and fluids, the next day (Wednesday) has watery stools just "running right out of me." The next day she was having more diarrhea, although not as frequent and not yellow mucousy, but still 6-8 a day. Has 2 more loose stools  this morning. Feels weak and "very puny." Denies hematochezia, melena. Does have some shortness of breath and left upper shoulder and arm pain typical of her fibromyalgia. She is trying to drink lots of fluids. Is trying to eat and has only had baked/broiled/grillsed chicken in addition to a lot of crackers and some fruit. She is still on Prednisone. Denies chest pain, dyspnea, dizziness, lightheadedness, syncope, near syncope. Denies any other upper or lower GI symptoms.  Past Medical History:  Diagnosis Date  . Anemia   . Collagenous colitis    colonoscopy 2009  . Depression   . Fibromyalgia   . GERD (gastroesophageal reflux disease)   . Hiatal hernia 2008   small  . Hypercholesterolemia   . Hypertension   . IBS (irritable bowel syndrome)   . Lymphocytic colitis    colonoscopy 2007, TCS 2013  . S/P endoscopy Sept 2008   small hh, s/p 4756 French dilator    Past Surgical History:  Procedure Laterality Date  . ABDOMINAL HYSTERECTOMY    . COLONOSCOPY  04/13/2008   Dr.Rourk- anal papilla and internal hemorrhage otherwise normal /left side diverticula  . COLONOSCOPY  09/20/2011   Dr.Rourk- Adequate preparation. Internal hemorrhoids; otherwise normal/ Left-sided diverticula, biopsies positive for lymphocytic colitis  . ESOPHAGOGASTRODUODENOSCOPY  04/22/2007   Dr.Rourk- normal esophagus, small hiatal hernia o/w normal stomach, D1 and D2 s/p passage of a 56-French maloney dilator  . fistula of colon    . FLEXIBLE SIGMOIDOSCOPY  05/29/2006   Dr.Rehman- No evidence of pseudomembranous or acute colitis.  Marland Kitchen. FOOT SURGERY    .  KNEE SURGERY    . NOSE SURGERY      Current Outpatient Prescriptions  Medication Sig Dispense Refill  . Ascorbic Acid (VITAMIN C PO) Take 1 tablet by mouth every morning.      . bismuth subsalicylate (PEPTO BISMOL) 262 MG chewable tablet Chew 2-5 tablets by mouth as needed for diarrhea or loose stools.    Marland Kitchen CALCIUM PO Take 1 tablet by mouth every morning.      .  Cholecalciferol (VITAMIN D3) 1000 UNITS CAPS Take 1,000 Units by mouth daily.    . citalopram (CELEXA) 20 MG tablet Take 20 mg by mouth every morning.     . hydrochlorothiazide (MICROZIDE) 12.5 MG capsule Take 12.5 mg by mouth every morning.     Marland Kitchen losartan (COZAAR) 50 MG tablet Take 50 mg by mouth every morning.     . Multiple Vitamins-Minerals (CENTRUM SILVER ULTRA WOMENS PO) Take 1 tablet by mouth every morning.      . Omega-3 Fatty Acids (FISH OIL) 1000 MG CAPS Take 2 capsules by mouth daily.    Marland Kitchen oxybutynin (DITROPAN) 5 MG tablet Take 5 mg by mouth daily.    . pantoprazole (PROTONIX) 40 MG tablet Take 1 tablet (40 mg total) by mouth daily before breakfast. 30 tablet 11  . potassium chloride SA (K-DUR,KLOR-CON) 20 MEQ tablet Take 2 tablets (40 mEq total) by mouth daily. 6 tablet 0  . predniSONE (DELTASONE) 10 MG tablet Take as directed. (Patient taking differently: Take 20 mg by mouth daily. Taking 20 mg daily now) 90 tablet 1  . Probiotic Product (PROBIOTIC DAILY PO) Take 1 capsule by mouth daily.    . traMADol (ULTRAM) 50 MG tablet Take 50 mg by mouth every 6 (six) hours as needed. For pain    . vitamin B-12 (CYANOCOBALAMIN) 1000 MCG tablet Take 1,000 mcg by mouth daily.     No current facility-administered medications for this visit.     Allergies as of 01/04/2017 - Review Complete 01/04/2017  Allergen Reaction Noted  . Codeine Hives and Swelling 12/27/2015    Family History  Problem Relation Age of Onset  . Colon cancer Maternal Uncle   . Colon cancer Maternal Grandmother     Social History   Social History  . Marital status: Widowed    Spouse name: N/A  . Number of children: N/A  . Years of education: N/A   Social History Main Topics  . Smoking status: Never Smoker  . Smokeless tobacco: Never Used  . Alcohol use No  . Drug use: No  . Sexual activity: Not Asked   Other Topics Concern  . None   Social History Narrative  . None    Review of Systems: General:  Negative for anorexia, weight loss, fever, chills. Eyes: Negative for vision changes.  ENT: Negative for hoarseness, difficulty swallowing. CV: Negative for chest pain, angina, palpitations, peripheral edema.  Respiratory: Negative for dyspnea at rest, cough, sputum, wheezing.  GI: See history of present illness. MS: Chronic fibromyalgia.  Derm: Negative for rash or itching.  Endo: Negative for unusual weight change.  Heme: Negative for bruising or bleeding. Allergy: Negative for rash or hives.   Physical Exam: BP 126/74   Pulse 92   Temp 97.3 F (36.3 C) (Oral)   Ht 5\' 3"  (1.6 m)   Wt 160 lb 3.2 oz (72.7 kg)   BMI 28.38 kg/m  General:   Alert and oriented. Pleasant and cooperative. Well-nourished and well-developed. Appears tired looking. Eyes:  Without icterus, sclera clear and conjunctiva pink.  Ears:  Normal auditory acuity. Cardiovascular:  S1, S2 present without murmurs appreciated. Extremities without clubbing or edema. Respiratory:  Clear to auscultation bilaterally. No wheezes, rales, or rhonchi. No distress.  Gastrointestinal:  +BS, soft, non-tender and non-distended. No HSM noted. No guarding or rebound. No masses appreciated.  Rectal:  Deferred  Musculoskalatal:  Symmetrical without gross deformities. Neurologic:  Alert and oriented x4;  grossly normal neurologically. Psych:  Alert and cooperative. Normal mood and affect. Heme/Lymph/Immune: No excessive bruising noted.    01/04/2017 12:16 PM   Disclaimer: This note was dictated with voice recognition software. Similar sounding words can inadvertently be transcribed and may not be corrected upon review.

## 2017-01-05 LAB — MAGNESIUM: Magnesium: 1.8 mg/dL (ref 1.5–2.5)

## 2017-01-07 ENCOUNTER — Encounter: Payer: Self-pay | Admitting: Internal Medicine

## 2017-01-07 ENCOUNTER — Other Ambulatory Visit: Payer: Self-pay

## 2017-01-07 ENCOUNTER — Telehealth: Payer: Self-pay

## 2017-01-07 MED ORDER — PEG 3350-KCL-NA BICARB-NACL 420 G PO SOLR: 4000.0000 mL | ORAL | 0 refills | Status: DC

## 2017-01-07 NOTE — Telephone Encounter (Signed)
[facility] called and Suprep was going to cost $95. Pt requested cheaper prep. Rx for Tri-Lyte sent in. [facility] to pick-up when gets rx. Called and informed pt.

## 2017-01-07 NOTE — Progress Notes (Signed)
cc'ed to pcp °

## 2017-01-09 ENCOUNTER — Encounter (HOSPITAL_COMMUNITY): Payer: Self-pay | Admitting: *Deleted

## 2017-01-09 ENCOUNTER — Ambulatory Visit (HOSPITAL_COMMUNITY)
Admission: RE | Admit: 2017-01-09 | Discharge: 2017-01-09 | Disposition: A | Discharge status: Home / Self Care | Payer: Medicare HMO | Source: Ambulatory Visit | Attending: Internal Medicine | Admitting: Internal Medicine

## 2017-01-09 ENCOUNTER — Encounter (HOSPITAL_COMMUNITY)
Admission: RE | Disposition: A | Discharge status: Home / Self Care | Payer: Self-pay | Source: Ambulatory Visit | Attending: Internal Medicine

## 2017-01-09 DIAGNOSIS — K219 Gastro-esophageal reflux disease without esophagitis: Secondary | ICD-10-CM | POA: Insufficient documentation

## 2017-01-09 DIAGNOSIS — F329 Major depressive disorder, single episode, unspecified: Secondary | ICD-10-CM | POA: Insufficient documentation

## 2017-01-09 DIAGNOSIS — M797 Fibromyalgia: Secondary | ICD-10-CM | POA: Diagnosis not present

## 2017-01-09 DIAGNOSIS — K529 Noninfective gastroenteritis and colitis, unspecified: Secondary | ICD-10-CM

## 2017-01-09 DIAGNOSIS — I10 Essential (primary) hypertension: Secondary | ICD-10-CM | POA: Diagnosis not present

## 2017-01-09 DIAGNOSIS — Z79899 Other long term (current) drug therapy: Secondary | ICD-10-CM | POA: Insufficient documentation

## 2017-01-09 DIAGNOSIS — Z885 Allergy status to narcotic agent status: Secondary | ICD-10-CM | POA: Diagnosis not present

## 2017-01-09 DIAGNOSIS — Z800 Family history of malignant neoplasm of digestive organs: Secondary | ICD-10-CM | POA: Diagnosis not present

## 2017-01-09 DIAGNOSIS — K52832 Lymphocytic colitis: Secondary | ICD-10-CM

## 2017-01-09 DIAGNOSIS — E7800 Pure hypercholesterolemia, unspecified: Secondary | ICD-10-CM | POA: Insufficient documentation

## 2017-01-09 DIAGNOSIS — E860 Dehydration: Secondary | ICD-10-CM

## 2017-01-09 DIAGNOSIS — K589 Irritable bowel syndrome without diarrhea: Secondary | ICD-10-CM | POA: Insufficient documentation

## 2017-01-09 DIAGNOSIS — Z9049 Acquired absence of other specified parts of digestive tract: Secondary | ICD-10-CM | POA: Insufficient documentation

## 2017-01-09 DIAGNOSIS — K5730 Diverticulosis of large intestine without perforation or abscess without bleeding: Secondary | ICD-10-CM | POA: Diagnosis not present

## 2017-01-09 DIAGNOSIS — R197 Diarrhea, unspecified: Secondary | ICD-10-CM

## 2017-01-09 HISTORY — PX: COLONOSCOPY: SHX5424

## 2017-01-09 HISTORY — PX: BIOPSY: SHX5522

## 2017-01-09 SURGERY — COLONOSCOPY
Anesthesia: Moderate Sedation

## 2017-01-09 MED ORDER — SODIUM CHLORIDE 0.9 % IV SOLN
INTRAVENOUS | Status: DC
  Administered 2017-01-09: 1000 mL via INTRAVENOUS

## 2017-01-09 MED ORDER — MEPERIDINE HCL 100 MG/ML IJ SOLN
INTRAMUSCULAR | Status: AC
  Filled 2017-01-09: qty 2

## 2017-01-09 MED ORDER — ONDANSETRON HCL 4 MG/2ML IJ SOLN
INTRAMUSCULAR | Status: AC
  Filled 2017-01-09: qty 2

## 2017-01-09 MED ORDER — STERILE WATER FOR IRRIGATION IR SOLN
Status: DC | PRN
  Administered 2017-01-09: 09:00:00

## 2017-01-09 MED ORDER — SIMETHICONE 40 MG/0.6ML PO SUSP
ORAL | Status: AC
  Filled 2017-01-09: qty 30

## 2017-01-09 MED ORDER — MIDAZOLAM HCL 5 MG/5ML IJ SOLN
INTRAMUSCULAR | Status: DC | PRN
  Administered 2017-01-09: 2 mg via INTRAVENOUS
  Administered 2017-01-09: 1 mg via INTRAVENOUS

## 2017-01-09 MED ORDER — MIDAZOLAM HCL 5 MG/5ML IJ SOLN
INTRAMUSCULAR | Status: AC
  Filled 2017-01-09: qty 10

## 2017-01-09 MED ORDER — MEPERIDINE HCL 100 MG/ML IJ SOLN
INTRAMUSCULAR | Status: DC | PRN
  Administered 2017-01-09: 25 mg via INTRAVENOUS
  Administered 2017-01-09: 50 mg via INTRAVENOUS

## 2017-01-09 MED ORDER — ONDANSETRON HCL 4 MG/2ML IJ SOLN
INTRAMUSCULAR | Status: DC | PRN
  Administered 2017-01-09: 4 mg via INTRAVENOUS

## 2017-01-09 NOTE — H&P (View-Only) (Signed)
Referring Provider: Claggett, Autumn PattyElin, PA-C Primary Care Physician:  Claggett, Autumn PattyElin, PA-C Primary GI:  Dr. Jena Gaussourk  Chief Complaint  Patient presents with  . Diarrhea    HPI:   Marie [name] is a 76 y.o. female who presents For further evaluation of diarrhea. The patient was last seen in our office 01/01/2017 with noted symptoms on and off for the better part of this year and well-documented history of microscopic colitis, cannot afford Entocort. GI pathogen panel and C. difficile negative prior to starting prednisone 40 mg daily last month and is on a slow taper at that time. Notes the prednisone made no difference in her symptoms, having 6-8 watery bowel movements a day and night with some incontinence. Was recently on antibiotics for urinary tract infection. She felt she was dehydrated. Recommended proceed to the emergency room, repeat stool study for C. difficile toxin, continue prednisone 20 mg daily for 7 more days then decrease to 10 mg daily for 7 days, then stop. Continue Protonix.  She was seen in the emergency department on 01/01/2017 for diarrhea as recommended. C. difficile quick scan was negative. Her creatinine was elevated at 1.11, low potassium. CBC found hemoglobin of 11.6 which is within her 5 year range/baseline. She was given IV fluids and potassium and sent home.   She called our office yesterday saying she had diarrhea all day yesterday about every 2 hours, no vomiting, drinking adequately been doesn't feel like eating much with watery stools with any oral intake of food. Recommended rapid return office visit for refractory diarrhea and may need another colonoscopy. Also recommended repeat labs.  Today she states she's not feeling well. After ER evaluation and fluids, the next day (Wednesday) has watery stools just "running right out of me." The next day she was having more diarrhea, although not as frequent and not yellow mucousy, but still 6-8 a day. Has 2 more loose stools  this morning. Feels weak and "very puny." Denies hematochezia, melena. Does have some shortness of breath and left upper shoulder and arm pain typical of her fibromyalgia. She is trying to drink lots of fluids. Is trying to eat and has only had baked/broiled/grillsed chicken in addition to a lot of crackers and some fruit. She is still on Prednisone. Denies chest pain, dyspnea, dizziness, lightheadedness, syncope, near syncope. Denies any other upper or lower GI symptoms.  Past Medical History:  Diagnosis Date  . Anemia   . Collagenous colitis    colonoscopy 2009  . Depression   . Fibromyalgia   . GERD (gastroesophageal reflux disease)   . Hiatal hernia 2008   small  . Hypercholesterolemia   . Hypertension   . IBS (irritable bowel syndrome)   . Lymphocytic colitis    colonoscopy 2007, TCS 2013  . S/P endoscopy Sept 2008   small hh, s/p 4756 French dilator    Past Surgical History:  Procedure Laterality Date  . ABDOMINAL HYSTERECTOMY    . COLONOSCOPY  04/13/2008   Dr.Rourk- anal papilla and internal hemorrhage otherwise normal /left side diverticula  . COLONOSCOPY  09/20/2011   Dr.Rourk- Adequate preparation. Internal hemorrhoids; otherwise normal/ Left-sided diverticula, biopsies positive for lymphocytic colitis  . ESOPHAGOGASTRODUODENOSCOPY  04/22/2007   Dr.Rourk- normal esophagus, small hiatal hernia o/w normal stomach, D1 and D2 s/p passage of a 56-French maloney dilator  . fistula of colon    . FLEXIBLE SIGMOIDOSCOPY  05/29/2006   Dr.Rehman- No evidence of pseudomembranous or acute colitis.  Marland Kitchen. FOOT SURGERY    .  KNEE SURGERY    . NOSE SURGERY      Current Outpatient Prescriptions  Medication Sig Dispense Refill  . Ascorbic Acid (VITAMIN C PO) Take 1 tablet by mouth every morning.      . bismuth subsalicylate (PEPTO BISMOL) 262 MG chewable tablet Chew 2-5 tablets by mouth as needed for diarrhea or loose stools.    Marland Kitchen CALCIUM PO Take 1 tablet by mouth every morning.      .  Cholecalciferol (VITAMIN D3) 1000 UNITS CAPS Take 1,000 Units by mouth daily.    . citalopram (CELEXA) 20 MG tablet Take 20 mg by mouth every morning.     . hydrochlorothiazide (MICROZIDE) 12.5 MG capsule Take 12.5 mg by mouth every morning.     Marland Kitchen losartan (COZAAR) 50 MG tablet Take 50 mg by mouth every morning.     . Multiple Vitamins-Minerals (CENTRUM SILVER ULTRA WOMENS PO) Take 1 tablet by mouth every morning.      . Omega-3 Fatty Acids (FISH OIL) 1000 MG CAPS Take 2 capsules by mouth daily.    Marland Kitchen oxybutynin (DITROPAN) 5 MG tablet Take 5 mg by mouth daily.    . pantoprazole (PROTONIX) 40 MG tablet Take 1 tablet (40 mg total) by mouth daily before breakfast. 30 tablet 11  . potassium chloride SA (K-DUR,KLOR-CON) 20 MEQ tablet Take 2 tablets (40 mEq total) by mouth daily. 6 tablet 0  . predniSONE (DELTASONE) 10 MG tablet Take as directed. (Patient taking differently: Take 20 mg by mouth daily. Taking 20 mg daily now) 90 tablet 1  . Probiotic Product (PROBIOTIC DAILY PO) Take 1 capsule by mouth daily.    . traMADol (ULTRAM) 50 MG tablet Take 50 mg by mouth every 6 (six) hours as needed. For pain    . vitamin B-12 (CYANOCOBALAMIN) 1000 MCG tablet Take 1,000 mcg by mouth daily.     No current facility-administered medications for this visit.     Allergies as of 01/04/2017 - Review Complete 01/04/2017  Allergen Reaction Noted  . Codeine Hives and Swelling 12/27/2015    Family History  Problem Relation Age of Onset  . Colon cancer Maternal Uncle   . Colon cancer Maternal Grandmother     Social History   Social History  . Marital status: Widowed    Spouse name: N/A  . Number of children: N/A  . Years of education: N/A   Social History Main Topics  . Smoking status: Never Smoker  . Smokeless tobacco: Never Used  . Alcohol use No  . Drug use: No  . Sexual activity: Not Asked   Other Topics Concern  . None   Social History Narrative  . None    Review of Systems: General:  Negative for anorexia, weight loss, fever, chills. Eyes: Negative for vision changes.  ENT: Negative for hoarseness, difficulty swallowing. CV: Negative for chest pain, angina, palpitations, peripheral edema.  Respiratory: Negative for dyspnea at rest, cough, sputum, wheezing.  GI: See history of present illness. MS: Chronic fibromyalgia.  Derm: Negative for rash or itching.  Endo: Negative for unusual weight change.  Heme: Negative for bruising or bleeding. Allergy: Negative for rash or hives.   Physical Exam: BP 126/74   Pulse 92   Temp 97.3 F (36.3 C) (Oral)   Ht 5\' 3"  (1.6 m)   Wt 160 lb 3.2 oz (72.7 kg)   BMI 28.38 kg/m  General:   Alert and oriented. Pleasant and cooperative. Well-nourished and well-developed. Appears tired looking. Eyes:  Without icterus, sclera clear and conjunctiva pink.  Ears:  Normal auditory acuity. Cardiovascular:  S1, S2 present without murmurs appreciated. Extremities without clubbing or edema. Respiratory:  Clear to auscultation bilaterally. No wheezes, rales, or rhonchi. No distress.  Gastrointestinal:  +BS, soft, non-tender and non-distended. No HSM noted. No guarding or rebound. No masses appreciated.  Rectal:  Deferred  Musculoskalatal:  Symmetrical without gross deformities. Neurologic:  Alert and oriented x4;  grossly normal neurologically. Psych:  Alert and cooperative. Normal mood and affect. Heme/Lymph/Immune: No excessive bruising noted.    01/04/2017 12:16 PM   Disclaimer: This note was dictated with voice recognition software. Similar sounding words can inadvertently be transcribed and may not be corrected upon review.

## 2017-01-09 NOTE — Interval H&P Note (Signed)
History and Physical Interval Note:  01/09/2017 8:37 AM  Marie [name]  has presented today for surgery, with the diagnosis of diarrhea, dehydration, lymphocytic colitis  The various methods of treatment have been discussed with the patient and family. After consideration of risks, benefits and other options for treatment, the patient has consented to  Procedure(s) with comments: COLONOSCOPY (N/A) - 7:30am as a surgical intervention .  The patient's history has been reviewed, patient examined, no change in status, stable for surgery.  I have reviewed the patient's chart and labs.  Questions were answered to the patient's satisfaction.     Robert Rourk  No change. Diagnostic colonoscopy per plan.  The risks, benefits, limitations, alternatives and imponderables have been reviewed with the patient. Questions have been answered. All parties are agreeable.

## 2017-01-09 NOTE — Discharge Instructions (Signed)
Colonoscopy Discharge Instructions  Read the instructions outlined below and refer to this sheet in the next few weeks. These discharge instructions provide you with general information on caring for yourself after you leave the hospital. Your doctor may also give you specific instructions. While your treatment has been planned according to the most current medical practices available, unavoidable complications occasionally occur. If you have any problems or questions after discharge, call Dr. Jena Gauss at [phone]. ACTIVITY  You may resume your regular activity, but move at a slower pace for the next 24 hours.   Take frequent rest periods for the next 24 hours.   Walking will help get rid of the air and reduce the bloated feeling in your belly (abdomen).   No driving for 24 hours (because of the medicine (anesthesia) used during the test).    Do not sign any important legal documents or operate any machinery for 24 hours (because of the anesthesia used during the test).  NUTRITION  Drink plenty of fluids.   You may resume your normal diet as instructed by your doctor.   Begin with a light meal and progress to your normal diet. Heavy or fried foods are harder to digest and may make you feel sick to your stomach (nauseated).   Avoid alcoholic beverages for 24 hours or as instructed.  MEDICATIONS  You may resume your normal medications unless your doctor tells you otherwise.  WHAT YOU CAN EXPECT TODAY  Some feelings of bloating in the abdomen.   Passage of more gas than usual.   Spotting of blood in your stool or on the toilet paper.  IF YOU HAD POLYPS REMOVED DURING THE COLONOSCOPY:  No aspirin products for 7 days or as instructed.   No alcohol for 7 days or as instructed.   Eat a soft diet for the next 24 hours.  FINDING OUT THE RESULTS OF YOUR TEST Not all test results are available during your visit. If your test results are not back during the visit, make an appointment  with your caregiver to find out the results. Do not assume everything is normal if you have not heard from your caregiver or the medical facility. It is important for you to follow up on all of your test results.  SEEK IMMEDIATE MEDICAL ATTENTION IF:  You have more than a spotting of blood in your stool.   Your belly is swollen (abdominal distention).   You are nauseated or vomiting.   You have a temperature over 101.   You have abdominal pain or discomfort that is severe or gets worse throughout the day.    Colon diverticulosis information provided    Colonoscopy, Adult, Care After This sheet gives you information about how to care for yourself after your procedure. Your health care provider may also give you more specific instructions. If you have problems or questions, contact your health care provider. What can I expect after the procedure? After the procedure, it is common to have:  A small amount of blood in your stool for 24 hours after the procedure.  Some gas.  Mild abdominal cramping or bloating.  Follow these instructions at home: General instructions   For the first 24 hours after the procedure: ? Do not drive or use machinery. ? Do not sign important documents. ? Do not drink alcohol. ? Do your regular daily activities at a slower pace than normal. ? Eat soft, easy-to-digest foods. ? Rest often.  Take over-the-counter or prescription medicines only as  told by your health care provider.  It is up to you to get the results of your procedure. Ask your health care provider, or the department performing the procedure, when your results will be ready. Relieving cramping and bloating  Try walking around when you have cramps or feel bloated.  Apply heat to your abdomen as told by your health care provider. Use a heat source that your health care provider recommends, such as a moist heat pack or a heating pad. ? Place a towel between your skin and the heat  source. ? Leave the heat on for 20-30 minutes. ? Remove the heat if your skin turns bright red. This is especially important if you are unable to feel pain, heat, or cold. You may have a greater risk of getting burned. Eating and drinking  Drink enough fluid to keep your urine clear or pale yellow.  Resume your normal diet as instructed by your health care provider. Avoid heavy or fried foods that are hard to digest.  Avoid drinking alcohol for as long as instructed by your health care provider. Contact a health care provider if:  You have blood in your stool 2-3 days after the procedure. Get help right away if:  You have more than a small spotting of blood in your stool.  You pass large blood clots in your stool.  Your abdomen is swollen.  You have nausea or vomiting.  You have a fever.  You have increasing abdominal pain that is not relieved with medicine. This information is not intended to replace advice given to you by your health care provider. Make sure you discuss any questions you have with your health care provider. Document Released: 03/06/2004 Document Revised: 04/16/2016 Document Reviewed: 10/04/2015 Elsevier Interactive Patient Education  2018 [employer]  Diverticulosis Diverticulosis is a condition that develops when small pouches (diverticula) form in the wall of the large intestine (colon). The colon is where water is absorbed and stool is formed. The pouches form when the inside layer of the colon pushes through weak spots in the outer layers of the colon. You may have a few pouches or many of them. What are the causes? The cause of this condition is not known. What increases the risk? The following factors may make you more likely to develop this condition:  Being older than age 74. Your risk for this condition increases with age. Diverticulosis is rare among people younger than age 74. By age 74, many people have it.  Eating a low-fiber diet.  Having  frequent constipation.  Being overweight.  Not getting enough exercise.  Smoking.  Taking over-the-counter pain medicines, like aspirin and ibuprofen.  Having a family history of diverticulosis.  What are the signs or symptoms? In most people, there are no symptoms of this condition. If you do have symptoms, they may include:  Bloating.  Cramps in the abdomen.  Constipation or diarrhea.  Pain in the lower left side of the abdomen.  How is this diagnosed? This condition is most often diagnosed during an exam for other colon problems. Because diverticulosis usually has no symptoms, it often cannot be diagnosed independently. This condition may be diagnosed by:  Using a flexible scope to examine the colon (colonoscopy).  Taking an X-ray of the colon after dye has been put into the colon (barium enema).  Doing a CT scan.  How is this treated? You may not need treatment for this condition if you have never developed an infection related to  diverticulosis. If you have had an infection before, treatment may include:  Eating a high-fiber diet. This may include eating more fruits, vegetables, and grains.  Taking a fiber supplement.  Taking a live bacteria supplement (probiotic).  Taking medicine to relax your colon.  Taking antibiotic medicines.  Follow these instructions at home:  Drink 6-8 glasses of water or more each day to prevent constipation.  Try not to strain when you have a bowel movement.  If you have had an infection before: ? Eat more fiber as directed by your health care provider or your diet and nutrition specialist (dietitian). ? Take a fiber supplement or probiotic, if your health care provider approves.  Take over-the-counter and prescription medicines only as told by your health care provider.  If you were prescribed an antibiotic, take it as told by your health care provider. Do not stop taking the antibiotic even if you start to feel  better.  Keep all follow-up visits as told by your health care provider. This is important. Contact a health care provider if:  You have pain in your abdomen.  You have bloating.  You have cramps.  You have not had a bowel movement in 3 days. Get help right away if:  Your pain gets worse.  Your bloating becomes very bad.  You have a fever or chills, and your symptoms suddenly get worse.  You vomit.  You have bowel movements that are bloody or black.  You have bleeding from your rectum. Summary  Diverticulosis is a condition that develops when small pouches (diverticula) form in the wall of the large intestine (colon).  You may have a few pouches or many of them.  This condition is most often diagnosed during an exam for other colon problems.  If you have had an infection related to diverticulosis, treatment may include increasing the fiber in your diet, taking supplements, or taking medicines. This information is not intended to replace advice given to you by your health care provider. Make sure you discuss any questions you have with your health care provider. Document Released: 04/19/2004 Document Revised: 06/11/2016 Document Reviewed: 06/11/2016 Elsevier Interactive Patient Education  2017 [employer].  Further recommendations to follow pending review of pathology report

## 2017-01-09 NOTE — Op Note (Signed)
[facility] Patient Name: Marie [name] Procedure Date: 01/09/2017 8:17 AM MRN: [id number] Date of Birth: Jul 15, 1941 Attending MD: Gennette Pac , MD CSN: [id number] Age: 76 Admit Type: Outpatient Procedure:                Ileo-colonoscopy with biopsy Indications:              Chronic diarrhea Providers:                Gennette Pac, MD, Criselda Peaches. Patsy Lager, RN,                            Burke Keels, Technician Referring MD:             Rush Barer, PA Medicines:                Midazolam 3 mg IV, Meperidine 75 mg IV, Ondansetron                            4 mg IV Complications:            No immediate complications. Estimated Blood Loss:     Estimated blood loss was minimal. Procedure:                Pre-Anesthesia Assessment:                           - Prior to the procedure, a History and Physical                            was performed, and patient medications and                            allergies were reviewed. The patient's tolerance of                            previous anesthesia was also reviewed. The risks                            and benefits of the procedure and the sedation                            options and risks were discussed with the patient.                            All questions were answered, and informed consent                            was obtained. Prior Anticoagulants: The patient has                            taken no previous anticoagulant or antiplatelet                            agents. ASA Grade Assessment: II - A patient with  mild systemic disease. After reviewing the risks                            and benefits, the patient was deemed in                            satisfactory condition to undergo the procedure.                           After obtaining informed consent, the colonoscope                            was passed under direct vision. Throughout the   procedure, the patient's blood pressure, pulse, and                            oxygen saturations were monitored continuously. The                            EC-[id number] ([id number]) scope was introduced through                            the anus and advanced to the 5 cm into the ileum.                            The terminal ileum, ileocecal valve, appendiceal                            orifice, and rectum were photographed. The entire                            colon was well visualized. The colonoscopy was                            performed without difficulty. The patient tolerated                            the procedure well. The quality of the bowel                            preparation was adequate. Scope In: 8:48:11 AM Scope Out: 8:58:10 AM Scope Withdrawal Time: 0 hours 6 minutes 27 seconds  Total Procedure Duration: 0 hours 9 minutes 59 seconds  Findings:      The perianal and digital rectal examinations were normal.      Scattered small and large-mouthed diverticula were found in the sigmoid       colon. Colonic mucosal slightly friable. No ulcers or erosions or other       abnormality seen. Distal 5 cm of terminal ileum mucosa also appeared       normal. Multiple biopsies of the ascending, descending and sigmoid       segments taken with cold forceps for histology. Estimated blood loss was       minimal.      The exam was otherwise without abnormality on direct and retroflexion  views. Impression:               - Diverticulosis in the sigmoid colon. Segmental                            biopsies taken.                           - The examination was otherwise normal on direct                            and retroflexion views. Moderate Sedation:      Moderate (conscious) sedation was administered by the endoscopy nurse       and supervised by the endoscopist. The following parameters were       monitored: oxygen saturation, heart rate, blood pressure, respiratory        rate, EKG, adequacy of pulmonary ventilation, and response to care.       Total physician intraservice time was 16 minutes. Recommendation:           - Written discharge instructions were provided to                            the patient.                           - The signs and symptoms of potential delayed                            complications were discussed with the patient.                           - Patient has a contact number available for                            emergencies.                           - Return to normal activities tomorrow.                           - Resume previous diet.                           - Continue present medications.                           - No recommendation at this time regarding repeat                            colonoscopy.                           - Return to GI clinic (date not yet determined). Procedure Code(s):        --- Professional ---                           [id number], Colonoscopy, flexible; with biopsy, single  or multiple                           99152, Moderate sedation services provided by the                            same physician or other qualified health care                            professional performing the diagnostic or                            therapeutic service that the sedation supports,                            requiring the presence of an independent trained                            observer to assist in the monitoring of the                            patient's level of consciousness and physiological                            status; initial 15 minutes of intraservice time,                            patient age 97 years or older Diagnosis Code(s):        --- Professional ---                           K52.9, Noninfective gastroenteritis and colitis,                            unspecified                           K57.30, Diverticulosis of large intestine without                             perforation or abscess without bleeding CPT copyright 2016 American Medical Association. All rights reserved. The codes documented in this report are preliminary and upon coder review may  be revised to meet current compliance requirements. Gerrit Friends. Rourk, MD Gennette Pac, MD 01/09/2017 9:10:42 AM This report has been signed electronically. Number of Addenda: 0

## 2017-01-10 ENCOUNTER — Encounter: Payer: Self-pay | Admitting: Internal Medicine

## 2017-01-10 ENCOUNTER — Ambulatory Visit: Payer: Medicare HMO | Admitting: Gastroenterology

## 2017-01-11 ENCOUNTER — Ambulatory Visit: Payer: Medicare HMO | Admitting: Nurse Practitioner

## 2017-01-14 ENCOUNTER — Other Ambulatory Visit: Payer: Self-pay | Admitting: Internal Medicine

## 2017-01-14 MED ORDER — BUDESONIDE 3 MG PO CPEP: 9.0000 mg | ORAL_CAPSULE | Freq: Every day | ORAL | 0 refills | Status: DC

## 2017-01-15 ENCOUNTER — Telehealth: Payer: Self-pay

## 2017-01-15 NOTE — Telephone Encounter (Signed)
Pt called back today about the entocort. It will cost her $300 a month. Pt said she cannot afford it. I checked her formulary and entocort is a tier 5 medication. I also looked up uceris and it is a tier 5 medication.  entocort does not have patient assistance. uceris does have  patient assistance. Can we try to get her assistance with uceris since she cannot get entocort.   I found a note in the patients chart that she had tried uceris in the past but it made her blood sugar go up. I asked the pt about this and she said she didn't remember this happening and she is not a diabetic. Pt is willing to try it again if RMR says it is ok and she can get assistance with it. Pt said she is still having diarrhea and is willing to try anything.   Please advise.

## 2017-01-15 NOTE — Telephone Encounter (Signed)
Pt is aware and willing to try uceris. She cannot afford it and will fill out assistance forms for it. She is unable to pick them up. I have mailed them to her and she said she will return them asap.

## 2017-01-15 NOTE — Telephone Encounter (Signed)
Try uceris;. She just finished prednisone. It will not be any worse than prednisone potentially on blood sugar.  Dispense 30 - 9 mg tablets. Take 9 mg daily. 1 refill.

## 2017-01-15 NOTE — Progress Notes (Signed)
Patient scheduled for follow up with Minerva AreolaEric in July

## 2017-01-16 ENCOUNTER — Encounter (HOSPITAL_COMMUNITY): Payer: Self-pay | Admitting: Internal Medicine

## 2017-01-16 NOTE — Telephone Encounter (Signed)
Tobi Bastosnna spoke with [facility] this morning and they can compound entocort for $65 a month. I called the pt to see if this would be something she could afford and they way she would not have to delay treatment. Pt said she could do that and to go ahead and call it in. I called the medication in to [facility], gave the pharmacist the rx per RMR instructions on path report. They will call patient when it is ready.  Routing to RMR for [employer]

## 2017-01-17 NOTE — Telephone Encounter (Signed)
I suppose this is worth a try. I have no information as to the bioavailability of the "compounded Entocort" in the colon. This may or may not be efficacious. If it does not work, I recommend this [facility].

## 2017-02-20 ENCOUNTER — Other Ambulatory Visit: Payer: Self-pay

## 2017-02-20 ENCOUNTER — Ambulatory Visit (INDEPENDENT_AMBULATORY_CARE_PROVIDER_SITE_OTHER): Payer: Medicare HMO | Admitting: Nurse Practitioner

## 2017-02-20 ENCOUNTER — Encounter: Payer: Self-pay | Admitting: Nurse Practitioner

## 2017-02-20 VITALS — BP 136/66 | HR 67 | Temp 97.0°F | Ht 63.0 in | Wt 157.0 lb

## 2017-02-20 DIAGNOSIS — D72820 Lymphocytosis (symptomatic): Secondary | ICD-10-CM

## 2017-02-20 DIAGNOSIS — K52832 Lymphocytic colitis: Secondary | ICD-10-CM | POA: Diagnosis not present

## 2017-02-20 NOTE — Assessment & Plan Note (Signed)
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 She was unable to afford Entocort. A compounded version of Entocort was affordable for her and she was started on this. She states her diarrhea resolved for about 3 weeks but now has come back. She is having 6-7 stools a day, still taking Entocort for another 2 weeks. At this point given the refractory nature of her diarrhea despite appropriate therapies, based on Dr. Luvenia Starchourk's recommendation, we will refer her to [facility] for further evaluation and treatment. I will have her continue her Entocort taper for the full dosing. Return for follow-up here in 6 months after she has been seen and [facility] by [facility].

## 2017-02-20 NOTE — Progress Notes (Signed)
Referring Provider: Claggett, Autumn Patty, PA-C Primary Care Physician:  [facility] Primary GI:  Dr. Jena Gauss  Chief Complaint  Patient presents with  . Diarrhea    Entocort helped for 3 wks, diarrhea started again last week  . Abdominal Pain    HPI:   Marie [name] is a 76 y.o. female who presents for diarrhea and dysphagia. The patient was last seen in our office 01/04/2017 for diarrhea, dehydration, lymphocytic colitis. Prior to her last visit she had been seen in the emergency department for diarrhea with elevated creatinine 1.11, hypokalemia. Hemoglobin was within baseline. She was given IV fluids, potassium, sent home. She continued to have diarrhea. At her last visit she was not feeling well and continued to have watery stools at least 6-8 a day. Was feeling weak, no rectal bleeding, trying to drink lots of fluids and eat but is only had minimal bland foods. Basic metabolic profile and magnesium were checked Bentyl sent to the pharmacy, recommended colonoscopy and return for follow-up in 4 weeks. ER precautions were given. Her potassium was a little low at 3.3, but improved from ER. Magnesium normal.  Colonoscopy completed 01/09/2017 which found diverticulosis in the sigmoid colon, segmental biopsies taken, otherwise normal. Surgical pathology found the biopsies to be consistent with lymphocytic colitis. Prednisone was not effective, recommended Entocort 9 mg daily for 6 weeks then reassess. Consider referral to tertiary care center if the patient would like it. She was unable to afford Entocort and was given a trial of Uceris 9 mg daily with one refill. She was unable to afford this and completed patient assistance forms. An alternative was found for compounded Entocort which she could afford. Recommended if ineffective to refer to [facility] for tertiary care.  Today she states she's not doing very well. Entocort worked well for 3 weeks, but her diarrhea has returned.  She has about 2 weeks of medications remaining. Is having about 6-7 loose non-watery diarrhea stools a day. Denies hematochezia, melena. Is trying to drink plenty of fluids. No identifiable triggers. Has eliminated spicy and fried foods from her diet; has also eliminated corn. Is eating a bland diet with a lot of baked/grilled/broiled chicken; also a lot of crackers. Denies fever, chills. Denies abdominal pain, but does have a sensation of "it just doesn't feel right." Is having some associated weakness as well. Denies chest pain, worsening dyspnea, dizziness, lightheadedness, syncope, near syncope. Denies any other upper or lower GI symptoms.  Past Medical History:  Diagnosis Date  . Anemia   . Collagenous colitis    colonoscopy 2009  . Depression   . Fibromyalgia   . GERD (gastroesophageal reflux disease)   . Hiatal hernia 2008   small  . Hypercholesterolemia   . Hypertension   . IBS (irritable bowel syndrome)   . Lymphocytic colitis    colonoscopy 2007, TCS 2013  . S/P endoscopy Sept 2008   small hh, s/p 32 French dilator    Past Surgical History:  Procedure Laterality Date  . ABDOMINAL HYSTERECTOMY    . BIOPSY  01/09/2017   Procedure: BIOPSY;  Surgeon: Corbin Ade, MD;  [facility];  Service: Endoscopy;;  colon  . COLONOSCOPY  04/13/2008   Dr.Rourk- anal papilla and internal hemorrhage otherwise normal /left side diverticula  . COLONOSCOPY  09/20/2011   Dr.Rourk- Adequate preparation. Internal hemorrhoids; otherwise normal/ Left-sided diverticula, biopsies positive for lymphocytic colitis  . COLONOSCOPY N/A 01/09/2017   Procedure: COLONOSCOPY;  Surgeon: Corbin Ade, MD;  [facility];  Service: Endoscopy;  Laterality: N/A;  7:30am  . ESOPHAGOGASTRODUODENOSCOPY  04/22/2007   Dr.Rourk- normal esophagus, small hiatal hernia o/w normal stomach, D1 and D2 s/p passage of a 56-French maloney dilator  . fistula of colon    . FLEXIBLE SIGMOIDOSCOPY  05/29/2006    Dr.Rehman- No evidence of pseudomembranous or acute colitis.  Marland Kitchen FOOT SURGERY    . KNEE SURGERY    . NOSE SURGERY      Current Outpatient Prescriptions  Medication Sig Dispense Refill  . Ascorbic Acid (VITAMIN C PO) Take 1 tablet by mouth every morning.      . bismuth subsalicylate (PEPTO BISMOL) 262 MG chewable tablet Chew 2-5 tablets by mouth as needed for diarrhea or loose stools.    . budesonide (ENTOCORT EC) 3 MG 24 hr capsule Take 3 capsules (9 mg total) by mouth daily. For 6 weeks (Patient taking differently: Take 6 mg by mouth daily. For 6 weeks) 132 capsule 0  . CALCIUM PO Take 1 tablet by mouth every morning.      . Cholecalciferol (VITAMIN D3) 1000 UNITS CAPS Take 1,000 Units by mouth daily.    . citalopram (CELEXA) 20 MG tablet Take 20 mg by mouth every morning.     Marland Kitchen esomeprazole (NEXIUM) 20 MG capsule Take 20 mg by mouth daily.    . hydrochlorothiazide (MICROZIDE) 12.5 MG capsule Take 12.5 mg by mouth every morning.     Marland Kitchen losartan (COZAAR) 50 MG tablet Take 50 mg by mouth every morning.     . Multiple Vitamins-Minerals (CENTRUM SILVER ULTRA WOMENS PO) Take 1 tablet by mouth every morning.      . Omega-3 Fatty Acids (FISH OIL) 1000 MG CAPS Take 2 capsules by mouth daily.    Marland Kitchen oxybutynin (DITROPAN) 5 MG tablet Take 5 mg by mouth daily.    . Probiotic Product (PROBIOTIC DAILY PO) Take 1 capsule by mouth daily.    . traMADol (ULTRAM) 50 MG tablet Take 50 mg by mouth every 6 (six) hours as needed. For pain    . vitamin B-12 (CYANOCOBALAMIN) 1000 MCG tablet Take 1,000 mcg by mouth daily.     No current facility-administered medications for this visit.     Allergies as of 02/20/2017 - Review Complete 02/20/2017  Allergen Reaction Noted  . Codeine Hives and Swelling 12/27/2015    Family History  Problem Relation Age of Onset  . Colon cancer Maternal Uncle   . Colon cancer Maternal Grandmother     Social History   Social History  . Marital status: Widowed    Spouse name:  N/A  . Number of children: N/A  . Years of education: N/A   Social History Main Topics  . Smoking status: Never Smoker  . Smokeless tobacco: Never Used  . Alcohol use No  . Drug use: No  . Sexual activity: Not Asked   Other Topics Concern  . None   Social History Narrative  . None    Review of Systems: General: Negative for anorexia, weight loss, fever, chills. Eyes: Negative for vision changes.  ENT: Negative for hoarseness, difficulty swallowing. CV: Negative for chest pain, angina, palpitations, peripheral edema.  Respiratory: Negative for dyspnea at rest, cough, sputum, wheezing.  GI: See history of present illness. Endo: Negative for unusual weight change.  Heme: Negative for bruising or bleeding. Allergy: Negative for rash or hives.   Physical Exam: BP 136/66   Pulse 67  Temp (!) 97 F (36.1 C) (Oral)   Ht 5\' 3"  (1.6 m)   Wt 157 lb (71.2 kg)   BMI 27.81 kg/m  General:   Alert and oriented. Pleasant and cooperative. Well-nourished and well-developed. Appears tired. Head:  Normocephalic and atraumatic. Eyes:  Without icterus, sclera clear and conjunctiva pink.  Ears:  Normal auditory acuity. Cardiovascular:  S1, S2 present without murmurs appreciated. Extremities without clubbing or edema. Respiratory:  Clear to auscultation bilaterally. No wheezes, rales, or rhonchi. No distress.  Gastrointestinal:  +BS, soft, non-tender and non-distended. No HSM noted. No guarding or rebound. No masses appreciated.  Rectal:  Deferred  Musculoskalatal:  Symmetrical without gross deformities. Neurologic:  Alert and oriented x4;  grossly normal neurologically. Psych:  Alert and cooperative. Normal mood and affect. Heme/Lymph/Immune: No excessive bruising noted.    02/20/2017 10:43 AM   Disclaimer: This note was dictated with voice recognition software. Similar sounding words can inadvertently be transcribed and may not be corrected upon review.

## 2017-02-20 NOTE — Patient Instructions (Signed)
1. Continue taking all of your Entocort. 2. We will refer you to [facility]. 3. Return for follow-up here in 6 months, after being seen by [facility].  4. Call us with any questions or concerns.

## 2017-02-20 NOTE — Progress Notes (Signed)
cc'ed to pcp °

## 2017-07-18 ENCOUNTER — Encounter: Payer: Self-pay | Admitting: Internal Medicine

## 2018-01-29 DIAGNOSIS — M797 Fibromyalgia: Secondary | ICD-10-CM | POA: Insufficient documentation

## 2019-02-18 DIAGNOSIS — M2010 Hallux valgus (acquired), unspecified foot: Secondary | ICD-10-CM | POA: Insufficient documentation

## 2019-02-18 DIAGNOSIS — M79674 Pain in right toe(s): Secondary | ICD-10-CM | POA: Insufficient documentation

## 2019-08-07 HISTORY — PX: VEIN SURGERY: SHX48

## 2019-08-07 HISTORY — PX: CATARACT EXTRACTION: SUR2

## 2020-04-28 ENCOUNTER — Other Ambulatory Visit: Payer: Self-pay

## 2020-04-28 ENCOUNTER — Ambulatory Visit (INDEPENDENT_AMBULATORY_CARE_PROVIDER_SITE_OTHER): Payer: Medicare (Managed Care) | Admitting: Nurse Practitioner

## 2020-04-28 ENCOUNTER — Encounter: Payer: Self-pay | Admitting: Nurse Practitioner

## 2020-04-28 VITALS — BP 136/66 | HR 64 | Temp 97.8°F | Ht <= 58 in | Wt 155.2 lb

## 2020-04-28 DIAGNOSIS — K52832 Lymphocytic colitis: Secondary | ICD-10-CM

## 2020-04-28 DIAGNOSIS — R197 Diarrhea, unspecified: Secondary | ICD-10-CM | POA: Diagnosis not present

## 2020-04-28 DIAGNOSIS — K219 Gastro-esophageal reflux disease without esophagitis: Secondary | ICD-10-CM

## 2020-04-28 NOTE — Patient Instructions (Signed)
Your health issues we discussed today were:   Recurrent/worsening diarrhea: 1. Pickup your stool collection kit from the lab today 2. You can bring a stool sample back to the lab tomorrow.  He needs to be liquid stools or they will not run the test 3. After you complete your stool studies we will call you with the results 4. If no infection in your stool studies, you can start Imodium 2 tablets twice a day.  You can increase this to 3 times a day and then to 4 times a day, if needed 5. If Imodium 4 times a day is not helping, you can also add Pepto-Bismol 6. Further recommendations will follow your stool study results 7. Call us for any worsening or severe symptoms especially if you develop fevers, nausea/vomiting, profound weakness, unable to stay hydrated  Overall I recommend:  1. Continue the current medications 2. Return for follow-up in 6 weeks 3. Call us for any questions or concerns   ---------------------------------------------------------------  I am glad you have gotten your COVID-19 vaccination!  Even though you are fully vaccinated you should continue to follow CDC and state/local guidelines.  ---------------------------------------------------------------   At [facility] we value your feedback. You may receive a survey about your visit today. Please share your experience as we strive to create trusting relationships with our patients to provide genuine, compassionate, quality care.  We appreciate your understanding and patience as we review any laboratory studies, imaging, and other diagnostic tests that are ordered as we care for you. Our office policy is 5 business days for review of these results, and any emergent or urgent results are addressed in a timely manner for your best interest. If you do not hear from our office in 1 week, please contact us.   We also encourage the use of MyChart, which contains your medical information for your review as well.  If you are not enrolled in this feature, an access code is on this after visit summary for your convenience. Thank you for allowing [place] to be involved in your care.  It was great to see you today!  I hope you have a great Fall!!

## 2020-04-28 NOTE — Progress Notes (Signed)
Referring Provider: [facility] Primary Care Physician:  [facility] Primary GI:  Dr. Jena Gauss  Chief Complaint  Patient presents with  . Diarrhea    daily x 2-3 weeks, taking imodium    HPI:   Marie [name] is a 79 y.o. female who presents for diarrhea.  Patient was last seen in our office 02/20/2017 for lymphocytic colitis.  Previous episodes of significant diarrhea and dehydration with associated hyperkalemia.  Colonoscopy was completed in 2018 which found lymphocytic colitis on segmental biopsies.  Prednisone was ineffective and recommended Entocort standard treatment protocol and consider referral to tertiary care if patient requested.  She was unable to afford Entocort and was given trial of Uceris 9 mg daily with 1 refill but she was unable to afford.  An alternative is found and compounded at a local pharmacy for Entocort that she was able to afford.  Recommended if ineffective refer to [facility].  Her last visit noted not doing well and Entocort worked well for 3 weeks but then diarrhea returned.  Still has 2 weeks of medications remaining and is having 6-7 loose stools that are nonwatery daily.  Trying to drink plenty of fluids, no identified triggers.  Recommended continue taking Entocort, referred to [facility], follow-up in 6 months.  The patient was seen by [facility] on 03/14/2017 at which point she noted some mild improvement and now only having 4-5 stools a day which are watery and with urgency as well as fecal incontinence 2-3 times a week.  Noted history of IBS-D.  Felt most likely lymphocytic colitis that is poorly controlled and recommended maximizing therapy by initiating Imodium, Pepto-Bismol, fiber, and budesonide in a stepwise manner until a regimen is identified to controls her symptoms.  Subsequently recommended start fiber supplement and Imodium 2 tablets 4 times a day.  If no improvement in 4 to 5 days then start Pepto-Bismol.   Continue taking budesonide. Possible future evaluation for SIBO or other causes if no improvement.  Recommended follow-up in 3 months.  The patient did not follow-up with [facility] as recommended.  She also did not follow-up with [place] as recommended.  Today she states she doing okay overall. She has been doing well until recently. Started having frequent diarrhea about 2 weeks ago, typically 3-4 stools a day. Is having more nocturnal stools then previously. Denies hematochezia. Denies recent antibiotics, sick contacts, recent travel. Has had recent eye surgery, vein surgery. Has mild abdominal pain, no improvement after stool. Denies N/V, melena, fever, chills, unintentional weight loss. Denies URI or flu-like symptoms. Denies loss of sense of taste or smell. The patient has received COVID-19 vaccination(s). Denies chest pain, dyspnea, dizziness, lightheadedness, syncope, near syncope. Denies any other upper or lower GI symptoms.  States onset of symptoms was gradual. Has chronic mild diarrhea, Imodium once daily typicaly controls symptoms but hasn't helped with her exacerbation.  GERD does well on Nexium.  Past Medical History:  Diagnosis Date  . Anemia   . Collagenous colitis    colonoscopy 2009  . Depression   . Fibromyalgia   . GERD (gastroesophageal reflux disease)   . Hiatal hernia 2008   small  . Hypercholesterolemia   . Hypertension   . IBS (irritable bowel syndrome)   . Lymphocytic colitis    colonoscopy 2007, TCS 2013  . S/P endoscopy Sept 2008   small hh, s/p 64 French dilator    Past Surgical History:  Procedure Laterality Date  .  ABDOMINAL HYSTERECTOMY    . BIOPSY  01/09/2017   Procedure: BIOPSY;  Surgeon: Corbin Ade, MD;  [facility];  Service: Endoscopy;;  colon  . CATARACT EXTRACTION Bilateral 2021  . COLONOSCOPY  04/13/2008   Dr.Rourk- anal papilla and internal hemorrhage otherwise normal /left side diverticula  . COLONOSCOPY  09/20/2011   Dr.Rourk-  Adequate preparation. Internal hemorrhoids; otherwise normal/ Left-sided diverticula, biopsies positive for lymphocytic colitis  . COLONOSCOPY N/A 01/09/2017   Procedure: COLONOSCOPY;  Surgeon: Corbin Ade, MD;  [facility];  Service: Endoscopy;  Laterality: N/A;  7:30am  . ESOPHAGOGASTRODUODENOSCOPY  04/22/2007   Dr.Rourk- normal esophagus, small hiatal hernia o/w normal stomach, D1 and D2 s/p passage of a 56-French maloney dilator  . fistula of colon    . FLEXIBLE SIGMOIDOSCOPY  05/29/2006   Dr.Rehman- No evidence of pseudomembranous or acute colitis.  Marland Kitchen FOOT SURGERY    . KNEE SURGERY    . NOSE SURGERY    . VEIN SURGERY Bilateral 2021    Current Outpatient Medications  Medication Sig Dispense Refill  . Ascorbic Acid (VITAMIN C PO) Take 1 tablet by mouth every morning.      Marland Kitchen CALCIUM PO Take 1 tablet by mouth every morning.      . Cholecalciferol (VITAMIN D3) 1000 UNITS CAPS Take 1,000 Units by mouth daily.    . citalopram (CELEXA) 20 MG tablet Take 20 mg by mouth every morning.     Marland Kitchen esomeprazole (NEXIUM) 20 MG capsule Take 20 mg by mouth daily.    . hydrochlorothiazide (MICROZIDE) 12.5 MG capsule Take 12.5 mg by mouth every morning.     . loperamide (IMODIUM) 2 MG capsule Take by mouth as needed for diarrhea or loose stools.    Marland Kitchen losartan (COZAAR) 50 MG tablet Take 50 mg by mouth every morning.     . Multiple Vitamins-Minerals (CENTRUM SILVER ULTRA WOMENS PO) Take 1 tablet by mouth every morning.      . Omega-3 Fatty Acids (FISH OIL) 1000 MG CAPS Take 2 capsules by mouth daily.    Marland Kitchen oxybutynin (DITROPAN) 5 MG tablet Take 5 mg by mouth daily.    . Probiotic Product (PROBIOTIC DAILY PO) Take 1 capsule by mouth daily.    . traMADol (ULTRAM) 50 MG tablet Take 50 mg by mouth every 6 (six) hours as needed. For pain    . vitamin B-12 (CYANOCOBALAMIN) 1000 MCG tablet Take 1,000 mcg by mouth daily.    Marland Kitchen bismuth subsalicylate (PEPTO BISMOL) 262 MG chewable tablet Chew 2-5 tablets  by mouth as needed for diarrhea or loose stools. (Patient not taking: Reported on 04/28/2020)     No current facility-administered medications for this visit.    Allergies as of 04/28/2020 - Review Complete 04/28/2020  Allergen Reaction Noted  . Codeine Hives and Swelling 12/27/2015    Family History  Problem Relation Age of Onset  . Colon cancer Maternal Uncle   . Colon cancer Maternal Grandmother     Social History   Socioeconomic History  . Marital status: Widowed    Spouse name: Not on file  . Number of children: Not on file  . Years of education: Not on file  . Highest education level: Not on file  Occupational History  . Not on file  Tobacco Use  . Smoking status: Never Smoker  . Smokeless tobacco: Never Used  Vaping Use  . Vaping Use: Never used  Substance and Sexual Activity  . Alcohol  use: No  . Drug use: No  . Sexual activity: Not on file  Other Topics Concern  . Not on file  Social History Narrative  . Not on file   Social Determinants of Health   Financial Resource Strain:   . Difficulty of Paying Living Expenses: Not on file  Food Insecurity:   . Worried About Programme researcher, broadcasting/film/video in the Last Year: Not on file  . Ran Out of Food in the Last Year: Not on file  Transportation Needs:   . Lack of Transportation (Medical): Not on file  . Lack of Transportation (Non-Medical): Not on file  Physical Activity:   . Days of Exercise per Week: Not on file  . Minutes of Exercise per Session: Not on file  Stress:   . Feeling of Stress : Not on file  Social Connections:   . Frequency of Communication with Friends and Family: Not on file  . Frequency of Social Gatherings with Friends and Family: Not on file  . Attends Religious Services: Not on file  . Active Member of Clubs or Organizations: Not on file  . Attends Banker Meetings: Not on file  . Marital Status: Not on file    Subjective: Review of Systems  Constitutional: Negative for  chills, fever, malaise/fatigue and weight loss.  HENT: Negative for congestion and sore throat.   Respiratory: Negative for cough and shortness of breath.   Cardiovascular: Negative for chest pain and palpitations.  Gastrointestinal: Positive for abdominal pain (very mild) and diarrhea. Negative for blood in stool, constipation, heartburn, melena, nausea and vomiting.  Musculoskeletal: Negative for joint pain and myalgias.  Skin: Negative for rash.  Neurological: Negative for dizziness and weakness.  Endo/Heme/Allergies: Does not bruise/bleed easily.  Psychiatric/Behavioral: Negative for depression. The patient is not nervous/anxious.   All other systems reviewed and are negative.    Objective: BP 136/66   Pulse 64   Temp 97.8 F (36.6 C) (Oral)   Ht 4\' 8"  (1.422 m)   Wt 155 lb 3.2 oz (70.4 kg)   BMI 34.80 kg/m  Physical Exam Vitals and nursing note reviewed.  Constitutional:      General: She is not in acute distress.    Appearance: Normal appearance. She is well-developed. She is not ill-appearing, toxic-appearing or diaphoretic.  HENT:     Head: Normocephalic and atraumatic.     Nose: No congestion or rhinorrhea.  Eyes:     General: No scleral icterus. Cardiovascular:     Rate and Rhythm: Normal rate and regular rhythm.     Heart sounds: Normal heart sounds.  Pulmonary:     Effort: Pulmonary effort is normal. No respiratory distress.     Breath sounds: Normal breath sounds.  Abdominal:     General: Bowel sounds are normal.     Palpations: Abdomen is soft. There is no hepatomegaly, splenomegaly or mass.     Tenderness: There is no abdominal tenderness. There is no guarding or rebound.     Hernia: No hernia is present.  Skin:    General: Skin is warm and dry.     Coloration: Skin is not jaundiced.     Findings: No rash.  Neurological:     General: No focal deficit present.     Mental Status: She is alert and oriented to person, place, and time.  Psychiatric:         Attention and Perception: Attention normal.        Mood and  Affect: Mood normal.        Speech: Speech normal.        Behavior: Behavior normal.        Thought Content: Thought content normal.        Cognition and Memory: Cognition and memory normal.      Assessment:  Very pleasant 79 year old female who presents for recurrent loose stools.  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  [facility] was [facility] who recommended continue the desonide and add Imodium, fiber, Pepto-Bismol and that order in a stepwise fashion.  Recommended follow-up in 3 months with them but she did not follow-up.  States she had somewhat of a gradual increase in worsening loose stools.  She has been having very mild loose stools since then but typically one Imodium as needed has controlled this.  Recently the Imodium once a day has not helped.  She is having about 4-5 stools a day, some stools nocturnally with urgency noted.  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  Entocort was quite expensive for her previously; [facility] was more affordable but still a bit pricey.  At this point, given her hesitation for Entocort I will check stool studies to ensure no occult infection.  If this is negative we will try a stepwise approach with Imodium 2 tablets up to 4 times a day, as previously recommended by [facility], and possible Pepto-Bismol if no improvement.  Further recommendations will follow her lab results.  Follow-up in 6 weeks and if still no improvement will need to start Entocort.  GERD currently doing well on PPI.   Plan: 1. C. difficile, GI pathogen panel 2. If stool studies negative start Imodium 2 tablets twice a day, can increase to 3 times a day or 4 times a day as needed 3. Can also add Pepto-Bismol if Imodium 4 times a day not effective 4. We will call with results, consider need for  Entocort 5. Further recommendations to follow 6. Follow-up in 6 weeks    Thank you for allowing us to participate in the care of Caren HazyMary K Stanback  Matthan Sledge, DNP, AGNP-C Adult & Gerontological Nurse Practitioner [facility]   04/28/2020 2:42 PM   Disclaimer: This note was dictated with voice recognition software. Similar sounding words can inadvertently be transcribed and may not be corrected upon review.

## 2020-05-16 ENCOUNTER — Telehealth: Payer: Self-pay | Admitting: Internal Medicine

## 2020-05-16 LAB — GASTROINTESTINAL PATHOGEN PANEL PCR
C. difficile Tox A/B, PCR: NOT DETECTED
Campylobacter, PCR: NOT DETECTED
Cryptosporidium, PCR: NOT DETECTED
E coli (ETEC) LT/ST PCR: NOT DETECTED
E coli (STEC) stx1/stx2, PCR: NOT DETECTED
E coli 0157, PCR: NOT DETECTED
Giardia lamblia, PCR: NOT DETECTED
Norovirus, PCR: NOT DETECTED
Rotavirus A, PCR: NOT DETECTED
Salmonella, PCR: NOT DETECTED
Shigella, PCR: NOT DETECTED

## 2020-05-16 LAB — C. DIFFICILE GDH AND TOXIN A/B
GDH ANTIGEN: NOT DETECTED
MICRO NUMBER:: 11030740
SPECIMEN QUALITY:: ADEQUATE
TOXIN A AND B: NOT DETECTED

## 2020-05-16 NOTE — Telephone Encounter (Signed)
Pt was calling to get her stool results. [phone] or [phone]

## 2020-05-16 NOTE — Telephone Encounter (Signed)
Routing to Eric  °

## 2020-05-17 ENCOUNTER — Telehealth: Payer: Self-pay | Admitting: Internal Medicine

## 2020-05-17 DIAGNOSIS — K580 Irritable bowel syndrome with diarrhea: Secondary | ICD-10-CM

## 2020-05-17 NOTE — Telephone Encounter (Signed)
Please see the result note for her stool study results  Please tell the patient I am sorry it took a while to get back to her, but the results just came back yesterday when I was out of the office.  Today was the first day I was able to enter her note.  I am glad her stools seem to be improving.  I would continue the Imodium for now.  We could trial another medication such as dicyclomine (Bentyl) to see if this helps.  However, I would want to err on the side of a low dose of Bentyl because it can cause dry mouth, dizziness (especially in patients over 34 years old).  Let me know which she would like to do.  Call if she has any other questions or concerns

## 2020-05-17 NOTE — Telephone Encounter (Signed)
Spoke with pt. Pt was notified of results. Pt was advised that she can continue Imodium for now. Pt is ok with trying trial of Bentyl.

## 2020-05-17 NOTE — Telephone Encounter (Signed)
Spoke with pt. Pt called to check on stool results. Pt is taking 3 Imodium tabs in the morning and 3 tabs at night. Pts stool is still loose but has improved some. Pt would like to know if she should continue to take the Imodium? Pt also noticed that her stool had been black but stopped taking Pepto since she feels it caused the discoloration in stool.

## 2020-05-17 NOTE — Telephone Encounter (Signed)
See other phone note for details. 

## 2020-05-17 NOTE — Telephone Encounter (Signed)
Patient called yesterday and said she has not gotten a call back, please call when you can

## 2020-05-18 MED ORDER — DICYCLOMINE HCL 10 MG PO CAPS: 10.0000 mg | ORAL_CAPSULE | Freq: Two times a day (BID) | ORAL | 3 refills | Status: DC | PRN

## 2020-05-18 NOTE — Addendum Note (Signed)
Addended by: Delane Ginger, Daila Elbert A on: 05/18/2020 07:32 AM   Modules accepted: Orders

## 2020-05-18 NOTE — Telephone Encounter (Signed)
Rx sent to pharmacy. Let me know if any problems/side effects.

## 2020-05-18 NOTE — Telephone Encounter (Signed)
Noted.  Pt was advised to call back with any problems, questions or concerns on 05/17/20.

## 2020-06-07 ENCOUNTER — Other Ambulatory Visit: Payer: Self-pay

## 2020-06-07 ENCOUNTER — Ambulatory Visit: Payer: Medicare (Managed Care) | Admitting: Internal Medicine

## 2020-06-07 ENCOUNTER — Encounter: Payer: Self-pay | Admitting: Internal Medicine

## 2020-06-07 VITALS — BP 148/79 | HR 77 | Temp 97.3°F | Ht <= 58 in | Wt 150.8 lb

## 2020-06-07 DIAGNOSIS — K52832 Lymphocytic colitis: Secondary | ICD-10-CM

## 2020-06-07 DIAGNOSIS — K219 Gastro-esophageal reflux disease without esophagitis: Secondary | ICD-10-CM

## 2020-06-07 NOTE — Progress Notes (Signed)
Primary Care Physician:  [facility] Primary Gastroenterologist:  Dr. Jena Gauss  Pre-Procedure History & Physical: HPI:  Marie [name] is a 79 y.o. female here for follow-up of GERD and microscopic colitis.  States she not taking anything for diarrhea at this time.  She had quite a bit of trouble with day and night stooling several weeks ago.  Stool studies negative.  But her diarrhea has calmed down but not resolved; still has 3-4 loose bowel movements daily.  Rare bout of incontinence.  No blood per rectum.  No corticosteroid therapy recently.  Entocort is realistically off limits due to cost.  Previously took compounded version of budesonide at [facility] - still $90 out-of-pocket for a months supply. She is somewhat afraid to eat because she may have diarrhea.  Again, not taking anything for it specifically.  Of note, she has lost 5 pounds since her last office visit.  Reflux symptoms not as well controlled as they were previously.  She takes Nexium 20 mg daily  - no dysphagia.  Past Medical History:  Diagnosis Date  . Anemia   . Collagenous colitis    colonoscopy 2009  . Depression   . Fibromyalgia   . GERD (gastroesophageal reflux disease)   . Hiatal hernia 2008   small  . Hypercholesterolemia   . Hypertension   . IBS (irritable bowel syndrome)   . Lymphocytic colitis    colonoscopy 2007, TCS 2013  . S/P endoscopy Sept 2008   small hh, s/p 92 French dilator    Past Surgical History:  Procedure Laterality Date  . ABDOMINAL HYSTERECTOMY    . BIOPSY  01/09/2017   Procedure: BIOPSY;  Surgeon: Corbin Ade, MD;  [facility];  Service: Endoscopy;;  colon  . CATARACT EXTRACTION Bilateral 2021  . COLONOSCOPY  04/13/2008   Dr.Marisah Laker- anal papilla and internal hemorrhage otherwise normal /left side diverticula  . COLONOSCOPY  09/20/2011   Dr.General Wearing- Adequate preparation. Internal hemorrhoids; otherwise normal/ Left-sided diverticula,  biopsies positive for lymphocytic colitis  . COLONOSCOPY N/A 01/09/2017   Procedure: COLONOSCOPY;  Surgeon: Corbin Ade, MD;  Location: [facility];  Service: Endoscopy;  Laterality: N/A;  7:30am  . ESOPHAGOGASTRODUODENOSCOPY  04/22/2007   Dr.Amador Braddy- normal esophagus, small hiatal hernia o/w normal stomach, D1 and D2 s/p passage of a 56-French maloney dilator  . fistula of colon    . FLEXIBLE SIGMOIDOSCOPY  05/29/2006   Dr.Rehman- No evidence of pseudomembranous or acute colitis.  Marland Kitchen FOOT SURGERY    . KNEE SURGERY    . NOSE SURGERY    . VEIN SURGERY Bilateral 2021    Prior to Admission medications   Medication Sig Start Date End Date Taking? Authorizing Provider  Ascorbic Acid (VITAMIN C PO) Take 1 tablet by mouth every morning.     Yes [provider]  bismuth subsalicylate (PEPTO BISMOL) 262 MG chewable tablet Chew 2-5 tablets by mouth as needed for diarrhea or loose stools.    Yes [provider]  CALCIUM PO Take 1 tablet by mouth every morning.     Yes [provider]  Cholecalciferol (VITAMIN D3) 1000 UNITS CAPS Take 1,000 Units by mouth daily.   Yes [provider]  citalopram (CELEXA) 20 MG tablet Take 20 mg by mouth every morning.    Yes [provider]  dicyclomine (BENTYL) 10 MG capsule Take 1 capsule (10 mg total) by mouth 2 (two) times daily as needed for  spasms. 05/18/20  Yes Anice Paganini, NP  esomeprazole (NEXIUM) 20 MG capsule Take 20 mg by mouth daily.   Yes [provider]  hydrochlorothiazide (MICROZIDE) 12.5 MG capsule Take 12.5 mg by mouth every morning.    Yes [provider]  loperamide (IMODIUM) 2 MG capsule Take by mouth as needed for diarrhea or loose stools.   Yes [provider]  losartan (COZAAR) 50 MG tablet Take 50 mg by mouth every morning.    Yes [provider]  Multiple Vitamins-Minerals (CENTRUM SILVER ULTRA WOMENS PO) Take 1 tablet by mouth every morning.     Yes [provider]  Omega-3 Fatty Acids (FISH OIL) 1000 MG CAPS Take 2 capsules by mouth daily.   Yes [provider]  oxybutynin (DITROPAN) 5 MG tablet Take 5 mg by mouth daily.   Yes [provider]  Probiotic Product (PROBIOTIC DAILY PO) Take 1 capsule by mouth daily.   Yes [provider]  traMADol (ULTRAM) 50 MG tablet Take 50 mg by mouth every 6 (six) hours as needed. For pain   Yes [provider]  vitamin B-12 (CYANOCOBALAMIN) 1000 MCG tablet Take 1,000 mcg by mouth daily.   Yes [provider]    Allergies as of 06/07/2020 - Review Complete 06/07/2020  Allergen Reaction Noted  . Codeine Hives and Swelling 12/27/2015    Family History  Problem Relation Age of Onset  . Colon cancer Maternal Uncle   . Colon cancer Maternal Grandmother     Social History   Socioeconomic History  . Marital status: Widowed    Spouse name: Not on file  . Number of children: Not on file  . Years of education: Not on file  . Highest education level: Not on file  Occupational History  . Not on file  Tobacco Use  . Smoking status: Never Smoker  . Smokeless tobacco: Never Used  Vaping Use  . Vaping Use: Never used  Substance and Sexual Activity  . Alcohol use: No  . Drug use: No  . Sexual activity: Not on file  Other Topics Concern  . Not on file  Social History Narrative  . Not on file   Social Determinants of Health   Financial Resource Strain:   . Difficulty of Paying Living Expenses: Not on file  Food Insecurity:   . Worried About Programme researcher, broadcasting/film/video in the Last Year: Not on file  . Ran Out of Food in the Last Year: Not on file  Transportation Needs:   . Lack of Transportation (Medical): Not on file  . Lack of Transportation (Non-Medical): Not on file  Physical Activity:   . Days of Exercise per Week: Not on file  . Minutes of Exercise per Session: Not on file  Stress:   . Feeling of Stress : Not on file  Social Connections:   .  Frequency of Communication with Friends and Family: Not on file  . Frequency of Social Gatherings with Friends and Family: Not on file  . Attends Religious Services: Not on file  . Active Member of Clubs or Organizations: Not on file  . Attends Banker Meetings: Not on file  . Marital Status: Not on file  Intimate Partner Violence:   . Fear of Current or Ex-Partner: Not on file  . Emotionally Abused: Not on file  . Physically Abused: Not on file  . Sexually Abused: Not on file    Review of Systems: See HPI,  otherwise negative ROS  Physical Exam: BP (!) 148/79   Pulse 77   Temp (!) 97.3 F (36.3 C) (Temporal)   Ht 4\' 8"  (1.422 m)   Wt 150 lb 12.8 oz (68.4 kg)   BMI 33.81 kg/m  General:   Alert,  Well-developed,  pleasant and cooperative in NAD Neck:  Supple; no masses or thyromegaly. No significant cervical adenopathy. Lungs:  Clear throughout to auscultation.   No wheezes, crackles, or rhonchi. No acute distress. Heart:  Regular rate and rhythm; no murmurs, clicks, rubs,  or gallops. Abdomen: Non-distended, normal bowel sounds.  Soft and nontender without appreciable mass or hepatosplenomegaly.  Pulses:  Normal pulses noted. Extremities:  Without clubbing or edema.  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  She has had a flare in symptoms recently.  She is not back to baseline.  I feel she needs corticosteroid therapy.  Practically speaking, budesonide  preparations  not very feasible given financial constraints. Talked about the pros and cons of low-dose prednisone with a very slow taper over time. Specifically, went over the risk and benefits including osteoporosis, elevated blood sugars, etc.  She is open to trying a course of prednisone beginning at a relatively conservative dose.  GERD suboptimally controlled on Nexium 20 mg daily without alarm symptoms.  Recommendations:  Increase Nexium to 40 mg daily  Begin prednisone  15 mg daily (disp 90 - 5 mg tabs with 1 refill).  If this regimen is efficacious, would consider a slow taper( over 3 to 4 months) utilizing the 1 mg prednisone tablet.  Office visit with 64 in 4 weeks if steady improvement in diarrhea does not ocurr    Notice: This dictation was prepared with Dragon dictation along with smaller phrase technology. Any transcriptional errors that result from this process are unintentional and may not be corrected upon review.

## 2020-06-07 NOTE — Patient Instructions (Signed)
Increase Nexium to 40 mg daily  Begin prednisone 15 mg daily (disp 90 - 5 mg tabs with 1 refill)  Office visit with [place] in 4 weeks if steady improvement in diarrhea does not ocurr

## 2020-06-09 ENCOUNTER — Telehealth: Payer: Self-pay | Admitting: Internal Medicine

## 2020-06-09 MED ORDER — PREDNISONE 5 MG PO TABS: 15.0000 mg | ORAL_TABLET | Freq: Every day | ORAL | 1 refills | Status: DC

## 2020-06-09 NOTE — Addendum Note (Signed)
Addended by: Myra Rude on: 06/09/2020 04:02 PM   Modules accepted: Orders

## 2020-06-09 NOTE — Telephone Encounter (Signed)
Rx has been sent in to [facility] per Dr.Rourks AVS.  Routing to Dr.Rourk for [employer]

## 2020-06-09 NOTE — Telephone Encounter (Signed)
PATIENT SAID Tuesday THAT DR Jena Gauss WAS GOING TO SEND IN A PRESCRIPTION FOR PREDNISONE TO [facility] AND THEY STILL DO NOT HAVE IT

## 2020-07-12 ENCOUNTER — Ambulatory Visit: Payer: Medicare (Managed Care) | Admitting: Internal Medicine

## 2020-07-12 ENCOUNTER — Other Ambulatory Visit: Payer: Self-pay

## 2020-07-12 ENCOUNTER — Encounter: Payer: Self-pay | Admitting: Internal Medicine

## 2020-07-12 VITALS — BP 138/70 | HR 81 | Temp 97.7°F | Ht <= 58 in | Wt 155.8 lb

## 2020-07-12 DIAGNOSIS — K219 Gastro-esophageal reflux disease without esophagitis: Secondary | ICD-10-CM | POA: Diagnosis not present

## 2020-07-12 DIAGNOSIS — K52832 Lymphocytic colitis: Secondary | ICD-10-CM

## 2020-07-12 MED ORDER — PREDNISONE 10 MG PO TABS: ORAL_TABLET | ORAL | 0 refills | Status: DC

## 2020-07-12 MED ORDER — PREDNISONE 5 MG PO TABS: ORAL_TABLET | ORAL | 0 refills | Status: DC

## 2020-07-12 NOTE — Progress Notes (Signed)
Primary Care Physician:  [facility] Primary Gastroenterologist:  Dr. Jena Gauss  Pre-Procedure History & Physical: HPI:  Marie [name] is a 79 y.o. female here for follow-up of lymphocytic colitis.  Prednisone 15 mg daily for 1 month has been associated with marked improvement-essentially resolution of her diarrhea  - having 1 formed bowel movement daily.  She is very pleased.  GERD - she is having some breakthrough GERD symptoms taking omeprazole every other day.  No dysphagia.  Past Medical History:  Diagnosis Date  . Anemia   . Collagenous colitis    colonoscopy 2009  . Depression   . Fibromyalgia   . GERD (gastroesophageal reflux disease)   . Hiatal hernia 2008   small  . Hypercholesterolemia   . Hypertension   . IBS (irritable bowel syndrome)   . Lymphocytic colitis    colonoscopy 2007, TCS 2013  . S/P endoscopy Sept 2008   small hh, s/p 42 French dilator    Past Surgical History:  Procedure Laterality Date  . ABDOMINAL HYSTERECTOMY    . BIOPSY  01/09/2017   Procedure: BIOPSY;  Surgeon: Corbin Ade, MD;  Location: [facility];  Service: Endoscopy;;  colon  . CATARACT EXTRACTION Bilateral 2021  . COLONOSCOPY  04/13/2008   Dr.Shaneece Stockburger- anal papilla and internal hemorrhage otherwise normal /left side diverticula  . COLONOSCOPY  09/20/2011   Dr.Desean Heemstra- Adequate preparation. Internal hemorrhoids; otherwise normal/ Left-sided diverticula, biopsies positive for lymphocytic colitis  . COLONOSCOPY N/A 01/09/2017   Procedure: COLONOSCOPY;  Surgeon: Corbin Ade, MD;  [facility]: [facility];  Service: Endoscopy;  Laterality: N/A;  7:30am  . ESOPHAGOGASTRODUODENOSCOPY  04/22/2007   Dr.Yaresly Menzel- normal esophagus, small hiatal hernia o/w normal stomach, D1 and D2 s/p passage of a 56-French maloney dilator  . fistula of colon    . FLEXIBLE SIGMOIDOSCOPY  05/29/2006   Dr.Rehman- No evidence of pseudomembranous or acute colitis.  Marland Kitchen FOOT SURGERY    . KNEE SURGERY     . NOSE SURGERY    . VEIN SURGERY Bilateral 2021    Prior to Admission medications   Medication Sig Start Date End Date Taking? Authorizing Provider  Ascorbic Acid (VITAMIN C PO) Take 1 tablet by mouth every morning.     Yes [provider]  bismuth subsalicylate (PEPTO BISMOL) 262 MG chewable tablet Chew 2-5 tablets by mouth as needed for diarrhea or loose stools.    Yes [provider]  CALCIUM PO Take 1 tablet by mouth every morning.     Yes [provider]  Cholecalciferol (VITAMIN D3) 1000 UNITS CAPS Take 1,000 Units by mouth daily.   Yes [provider]  citalopram (CELEXA) 20 MG tablet Take 20 mg by mouth every morning.    Yes [provider]  dicyclomine (BENTYL) 10 MG capsule Take 1 capsule (10 mg total) by mouth 2 (two) times daily as needed for spasms. 05/18/20  Yes Anice Paganini, NP  esomeprazole (NEXIUM) 40 MG capsule Take 40 mg by mouth daily.    Yes [provider]  hydrochlorothiazide (MICROZIDE) 12.5 MG capsule Take 12.5 mg by mouth every morning.    Yes [provider]  loperamide (IMODIUM) 2 MG capsule Take by mouth as needed for diarrhea or loose stools.   Yes [provider]  losartan (COZAAR) 50 MG tablet Take 50 mg by mouth every morning.    Yes [provider]  Multiple Vitamins-Minerals (CENTRUM SILVER ULTRA WOMENS PO)  Take 1 tablet by mouth every morning.     Yes [provider]  Omega-3 Fatty Acids (FISH OIL) 1000 MG CAPS Take 2 capsules by mouth daily.   Yes [provider]  oxybutynin (DITROPAN) 5 MG tablet Take 5 mg by mouth daily.   Yes [provider]  Probiotic Product (PROBIOTIC DAILY PO) Take 1 capsule by mouth daily.   Yes [provider]  traMADol (ULTRAM) 50 MG tablet Take 50 mg by mouth every 6 (six) hours as needed. For pain   Yes [provider]  vitamin B-12 (CYANOCOBALAMIN) 1000 MCG tablet Take 1,000 mcg by mouth daily.   Yes  [provider]    Allergies as of 07/12/2020 - Review Complete 07/12/2020  Allergen Reaction Noted  . Codeine Hives and Swelling 12/27/2015    Family History  Problem Relation Age of Onset  . Colon cancer Maternal Uncle   . Colon cancer Maternal Grandmother     Social History   Socioeconomic History  . Marital status: Widowed    Spouse name: Not on file  . Number of children: Not on file  . Years of education: Not on file  . Highest education level: Not on file  Occupational History  . Not on file  Tobacco Use  . Smoking status: Never Smoker  . Smokeless tobacco: Never Used  Vaping Use  . Vaping Use: Never used  Substance and Sexual Activity  . Alcohol use: No  . Drug use: No  . Sexual activity: Not on file  Other Topics Concern  . Not on file  Social History Narrative  . Not on file   Social Determinants of Health   Financial Resource Strain:   . Difficulty of Paying Living Expenses: Not on file  Food Insecurity:   . Worried About Programme researcher, broadcasting/film/video in the Last Year: Not on file  . Ran Out of Food in the Last Year: Not on file  Transportation Needs:   . Lack of Transportation (Medical): Not on file  . Lack of Transportation (Non-Medical): Not on file  Physical Activity:   . Days of Exercise per Week: Not on file  . Minutes of Exercise per Session: Not on file  Stress:   . Feeling of Stress : Not on file  Social Connections:   . Frequency of Communication with Friends and Family: Not on file  . Frequency of Social Gatherings with Friends and Family: Not on file  . Attends Religious Services: Not on file  . Active Member of Clubs or Organizations: Not on file  . Attends Banker Meetings: Not on file  . Marital Status: Not on file  Intimate Partner Violence:   . Fear of Current or Ex-Partner: Not on file  . Emotionally Abused: Not on file  . Physically Abused: Not on file  . Sexually Abused: Not on file    Review of  Systems: See HPI, otherwise negative ROS  Physical Exam: BP 138/70   Pulse 81   Temp 97.7 F (36.5 C)   Ht 4\' 8"  (1.422 m)   Wt 155 lb 12.8 oz (70.7 kg)   BMI 34.93 kg/m  General:   Alert,  Well-developed, well-nourished, pleasant and cooperative in NAD Neck:  Supple; no masses or thyromegaly. No significant cervical adenopathy. Lungs:  Clear throughout to auscultation.   No wheezes, crackles, or rhonchi. No acute distress. Heart:  Regular rate and rhythm; no murmurs, clicks, rubs,  or gallops. Abdomen: Non-distended, normal  bowel sounds.  Soft and nontender without appreciable mass or hepatosplenomegaly.   Impression/Plan: 78 year old lady with recurrent lymphocytic colitis manifesting as nonbloody diarrhea.  Could not afford Entocort.  Feels that prednisone has been of great benefit at the above low dose for the past month.  I think it would be in her best interest to continue on a slow taper over a period of the next few months in the hopes that we can achieve a long-term remission.  GERD suboptimally controlled on every other day therapy.  Recent prednisone therapy may have exacerbated her symptoms.  No alarm features.  Recommendations:  Continue Nexium 40 mg daily  Resume prednisone at a dose of 10 mg daily x 1 month (disp 30 - 10 mg tabs)  In one month - decrease Prednisone to 5 mg daily x 1 month (disp 30 - 5 mg tablets)  OV her in 2 months    Notice: This dictation was prepared with Dragon dictation along with smaller phrase technology. Any transcriptional errors that result from this process are unintentional and may not be corrected upon review.

## 2020-07-12 NOTE — Patient Instructions (Signed)
Continue Nexium 40 mg daily  Resume prednisone at a dose of 10 mg daily x 1 month (disp 30 - 10 mg tabs)  In one month - decrease Prednisone to 5 mg daily x 1 month (disp 30 - 5 mg tablets)  OV her in 2 months

## 2020-09-13 ENCOUNTER — Other Ambulatory Visit: Payer: Self-pay

## 2020-09-13 ENCOUNTER — Ambulatory Visit: Payer: Medicare (Managed Care) | Admitting: Internal Medicine

## 2020-09-13 ENCOUNTER — Encounter: Payer: Self-pay | Admitting: Internal Medicine

## 2020-09-13 VITALS — BP 147/71 | HR 70 | Temp 97.1°F | Ht <= 58 in | Wt 157.6 lb

## 2020-09-13 DIAGNOSIS — K52831 Collagenous colitis: Secondary | ICD-10-CM

## 2020-09-13 MED ORDER — PREDNISONE 5 MG PO TABS: ORAL_TABLET | ORAL | 2 refills | Status: DC

## 2020-09-13 NOTE — Patient Instructions (Addendum)
Decrease prednisone to a 5 mg tablet every other day for the next 3 months  (dispense (30) 5 mg tablets with 2 refills). Take 5 mg orally every other day.  Office visit in 3 months  If diarrhea returns prior to next office visit, please let me know.  As discussed, I would like you to be on the lowest possible dose of prednisone to minimize side effects but still control your diarrhea.  As far as reflux is concerned, continue taking Nexium 40 mg once daily  See you in 3 months.

## 2020-09-13 NOTE — Progress Notes (Signed)
Primary Care Physician:  [facility]:  Dr.   Pre-Procedure History & Physical: HPI:  Marie [name] is a 80 y.o. female here for follow-up of collagenous colitis.  Again, she has attained a nice remission with prednisone.  Utilized Entocort originally;   this was her third recurrence.  Entocort previously too expensive. More recently,   on 5 mg daily of prednisone for the past couple of months.  She Is back to her old baseline -  she is very happy about having 1-2 formed bowel movements daily.  She does not want to come off prednisone.  She tells me she does have some "thinning of the bones".  Past Medical History:  Diagnosis Date  . Anemia   . Collagenous colitis    colonoscopy 2009  . Depression   . Fibromyalgia   . GERD (gastroesophageal reflux disease)   . Hiatal hernia 2008   small  . Hypercholesterolemia   . Hypertension   . IBS (irritable bowel syndrome)   . Lymphocytic colitis    colonoscopy 2007, TCS 2013  . S/P endoscopy Sept 2008   small hh, s/p 27 French dilator    Past Surgical History:  Procedure Laterality Date  . ABDOMINAL HYSTERECTOMY    . BIOPSY  01/09/2017   Procedure: BIOPSY;  Surgeon: Daneil Dolin, MD;  [facility]: [facility];  Service: Endoscopy;;  colon  . CATARACT EXTRACTION Bilateral 2021  . COLONOSCOPY  04/13/2008   Dr.Ondra Deboard- anal papilla and internal hemorrhage otherwise normal /left side diverticula  . COLONOSCOPY  09/20/2011   Dr.Kiyoto Slomski- Adequate preparation. Internal hemorrhoids; otherwise normal/ Left-sided diverticula, biopsies positive for lymphocytic colitis  . COLONOSCOPY N/A 01/09/2017   Procedure: COLONOSCOPY;  Surgeon: Daneil Dolin, MD;  Location: [facility];  Service: Endoscopy;  Laterality: N/A;  7:30am  . ESOPHAGOGASTRODUODENOSCOPY  04/22/2007   Dr.Mehar Kirkwood- normal esophagus, small hiatal hernia o/w normal stomach, D1 and D2 s/p passage of a 56-French maloney dilator  . fistula of  colon    . FLEXIBLE SIGMOIDOSCOPY  05/29/2006   Dr.Rehman- No evidence of pseudomembranous or acute colitis.  Marland Kitchen FOOT SURGERY    . KNEE SURGERY    . NOSE SURGERY    . VEIN SURGERY Bilateral 2021    Prior to Admission medications   Medication Sig Start Date End Date Taking? Authorizing Provider  Ascorbic Acid (VITAMIN C PO) Take 1 tablet by mouth every morning.   Yes [provider]  bismuth subsalicylate (PEPTO BISMOL) 262 MG chewable tablet Chew 2-5 tablets by mouth as needed for diarrhea or loose stools.    Yes [provider]  CALCIUM PO Take 1 tablet by mouth every morning.   Yes [provider]  Cholecalciferol (VITAMIN D3) 1000 UNITS CAPS Take 1,000 Units by mouth daily.   Yes [provider]  citalopram (CELEXA) 20 MG tablet Take 20 mg by mouth every morning.   Yes [provider]  esomeprazole (NEXIUM) 40 MG capsule Take 40 mg by mouth daily.    Yes [provider]  hydrochlorothiazide (MICROZIDE) 12.5 MG capsule Take 12.5 mg by mouth every morning.   Yes [provider]  losartan (COZAAR) 50 MG tablet Take 50 mg by mouth every morning.   Yes [provider]  Multiple Vitamins-Minerals (CENTRUM SILVER ULTRA WOMENS PO) Take 1 tablet by mouth every morning.   Yes [provider]  Omega-3 Fatty Acids (FISH OIL) 1000 MG CAPS Take  2 capsules by mouth daily.   Yes [provider]  oxybutynin (DITROPAN) 5 MG tablet Take 5 mg by mouth daily.   Yes [provider]  predniSONE (DELTASONE) 5 MG tablet Start 5 mg daily after you complete  Prednisone 10 mg. 07/12/20  Yes Adalea Handler, Cristopher Estimable, MD  Probiotic Product (PROBIOTIC DAILY PO) Take 1 capsule by mouth daily.   Yes [provider]  traMADol (ULTRAM) 50 MG tablet Take 50 mg by mouth every 6 (six) hours as needed. For pain   Yes [provider]  vitamin B-12 (CYANOCOBALAMIN) 1000 MCG tablet Take 1,000 mcg by mouth daily.   Yes  [provider]  dicyclomine (BENTYL) 10 MG capsule Take 1 capsule (10 mg total) by mouth 2 (two) times daily as needed for spasms. Patient not taking: Reported on 09/13/2020 05/18/20   Carlis Stable, NP  loperamide (IMODIUM) 2 MG capsule Take by mouth as needed for diarrhea or loose stools. Patient not taking: Reported on 09/13/2020    [provider]  predniSONE (DELTASONE) 10 MG tablet Take 10 mg daily for 30 days, then decrease to 5 mg daily. Patient not taking: Reported on 09/13/2020 07/12/20   Daneil Dolin, MD    Allergies as of 09/13/2020 - Review Complete 09/13/2020  Allergen Reaction Noted  . Codeine Hives and Swelling 12/27/2015    Family History  Problem Relation Age of Onset  . Colon cancer Maternal Uncle   . Colon cancer Maternal Grandmother     Social History   Socioeconomic History  . Marital status: Widowed    Spouse name: Not on file  . Number of children: Not on file  . Years of education: Not on file  . Highest education level: Not on file  Occupational History  . Not on file  Tobacco Use  . Smoking status: Never Smoker  . Smokeless tobacco: Never Used  Vaping Use  . Vaping Use: Never used  Substance and Sexual Activity  . Alcohol use: No  . Drug use: No  . Sexual activity: Not on file  Other Topics Concern  . Not on file  Social History Narrative  . Not on file   Social Determinants of Health   Financial Resource Strain: Not on file  Food Insecurity: Not on file  Transportation Needs: Not on file  Physical Activity: Not on file  Stress: Not on file  Social Connections: Not on file  Intimate Partner Violence: Not on file    Review of Systems: See HPI, otherwise negative ROS  Physical Exam: BP (!) 147/71   Pulse 70   Temp (!) 97.1 F (36.2 C) (Temporal)   Ht 4' 8"  (1.422 m)   Wt 157 lb 9.6 oz (71.5 kg)   BMI 35.33 kg/m  General:   Alert,  Well-developed, well-nourished, pleasant and cooperative in NAD Neck:  Supple; no  masses or thyromegaly. No significant cervical adenopathy. Lungs:  Clear throughout to auscultation.   No wheezes, crackles, or rhonchi. No acute distress. Heart:  Regular rate and rhythm; no murmurs, clicks, rubs,  or gallops. Abdomen: Non-distended, normal bowel sounds.  Soft and nontender without appreciable mass or hepatosplenomegaly.  Pulses:  Normal pulses noted. Extremities:  Without clubbing or edema.  Impression/Plan: 80 year old lady with well-documented collagenous colitis in remission on prednisone 5 mg daily.  I feel if we take her off this medication she is likely flare.  No good alternative agents.  I discussed the increase risk of the myriad  of complications/side effects which are known to occur  with corticosteroids - progressively with increased doses and duration of treatment..   Risk versus benefits reviewed in some detail.  Recommendations:  Decrease prednisone to a 5 mg tablet every other day for the next 3 months  (dispense (30) 5 mg tablets with 2 refills). Take 5 mg orally every other day.  Office visit in 3 months  If diarrhea returns prior to next office visit, patient is to let me know.  As discussed, I would her to be on the lowest possible dose of prednisone to minimize side effects but still control diarrhea.  As far as reflux is concerned, continue taking Nexium 40 mg once daily  Office visit 3 months.     Notice: This dictation was prepared with Dragon dictation along with smaller phrase technology. Any transcriptional errors that result from this process are unintentional and may not be corrected upon review.

## 2020-11-02 ENCOUNTER — Encounter: Payer: Self-pay | Admitting: Internal Medicine

## 2020-11-21 ENCOUNTER — Observation Stay (HOSPITAL_BASED_OUTPATIENT_CLINIC_OR_DEPARTMENT_OTHER): Payer: Medicare (Managed Care)

## 2020-11-21 ENCOUNTER — Observation Stay (HOSPITAL_COMMUNITY)
Admission: EM | Admit: 2020-11-21 | Discharge: 2020-11-21 | Disposition: A | Discharge status: Home / Self Care | Payer: Medicare (Managed Care) | Attending: Internal Medicine | Admitting: Internal Medicine

## 2020-11-21 ENCOUNTER — Other Ambulatory Visit: Payer: Self-pay

## 2020-11-21 ENCOUNTER — Emergency Department (HOSPITAL_COMMUNITY): Payer: Medicare (Managed Care)

## 2020-11-21 ENCOUNTER — Encounter (HOSPITAL_COMMUNITY): Payer: Self-pay

## 2020-11-21 DIAGNOSIS — Z20822 Contact with and (suspected) exposure to covid-19: Secondary | ICD-10-CM | POA: Diagnosis not present

## 2020-11-21 DIAGNOSIS — D649 Anemia, unspecified: Secondary | ICD-10-CM | POA: Insufficient documentation

## 2020-11-21 DIAGNOSIS — M797 Fibromyalgia: Secondary | ICD-10-CM | POA: Insufficient documentation

## 2020-11-21 DIAGNOSIS — R079 Chest pain, unspecified: Secondary | ICD-10-CM

## 2020-11-21 DIAGNOSIS — K449 Diaphragmatic hernia without obstruction or gangrene: Secondary | ICD-10-CM | POA: Insufficient documentation

## 2020-11-21 DIAGNOSIS — I10 Essential (primary) hypertension: Secondary | ICD-10-CM | POA: Diagnosis not present

## 2020-11-21 DIAGNOSIS — I361 Nonrheumatic tricuspid (valve) insufficiency: Secondary | ICD-10-CM | POA: Diagnosis not present

## 2020-11-21 DIAGNOSIS — R0609 Other forms of dyspnea: Secondary | ICD-10-CM

## 2020-11-21 DIAGNOSIS — R0789 Other chest pain: Principal | ICD-10-CM | POA: Insufficient documentation

## 2020-11-21 DIAGNOSIS — R0781 Pleurodynia: Secondary | ICD-10-CM

## 2020-11-21 DIAGNOSIS — E785 Hyperlipidemia, unspecified: Secondary | ICD-10-CM | POA: Insufficient documentation

## 2020-11-21 DIAGNOSIS — Z79899 Other long term (current) drug therapy: Secondary | ICD-10-CM | POA: Insufficient documentation

## 2020-11-21 DIAGNOSIS — F329 Major depressive disorder, single episode, unspecified: Secondary | ICD-10-CM | POA: Insufficient documentation

## 2020-11-21 DIAGNOSIS — K589 Irritable bowel syndrome without diarrhea: Secondary | ICD-10-CM | POA: Insufficient documentation

## 2020-11-21 DIAGNOSIS — K219 Gastro-esophageal reflux disease without esophagitis: Secondary | ICD-10-CM | POA: Insufficient documentation

## 2020-11-21 DIAGNOSIS — E876 Hypokalemia: Secondary | ICD-10-CM | POA: Insufficient documentation

## 2020-11-21 LAB — TROPONIN I (HIGH SENSITIVITY)
Troponin I (High Sensitivity): 5 ng/L (ref <18)
Troponin I (High Sensitivity): 6 ng/L (ref <18)
Troponin I (High Sensitivity): 6 ng/L (ref <18)
Troponin I (High Sensitivity): 6 ng/L (ref <18)

## 2020-11-21 LAB — BASIC METABOLIC PANEL
Anion gap: 12 (ref 5–15)
BUN: 29 mg/dL — ABNORMAL HIGH (ref 8–23)
CO2: 23 mmol/L (ref 22–32)
Calcium: 9 mg/dL (ref 8.9–10.3)
Chloride: 101 mmol/L (ref 98–111)
Creatinine, Ser: 1.03 mg/dL — ABNORMAL HIGH (ref 0.44–1.00)
GFR, Estimated: 56 mL/min — ABNORMAL LOW (ref >60)
Glucose, Bld: 128 mg/dL — ABNORMAL HIGH (ref 70–99)
Potassium: 3.2 mmol/L — ABNORMAL LOW (ref 3.5–5.1)
Sodium: 136 mmol/L (ref 135–145)

## 2020-11-21 LAB — NM MYOCAR MULTI W/SPECT W/WALL MOTION / EF
LV dias vol: 45 mL (ref 46–106)
LV sys vol: 4 mL
Peak HR: 87 {beats}/min
RATE: 0.43
Rest HR: 57 {beats}/min
SDS: 1
SRS: 3
SSS: 4
TID: 0.96

## 2020-11-21 LAB — ECHOCARDIOGRAM COMPLETE
AR max vel: 2.29 cm2
AV Area VTI: 1.96 cm2
AV Area mean vel: 2.1 cm2
AV Mean grad: 7.5 mmHg
AV Peak grad: 12.7 mmHg
Ao pk vel: 1.78 m/s
Area-P 1/2: 2.87 cm2
Height: 62 in
S' Lateral: 2.8 cm
Weight: 2480 [oz_av]

## 2020-11-21 LAB — CBC
HCT: 33 % — ABNORMAL LOW (ref 36.0–46.0)
Hemoglobin: 10.5 g/dL — ABNORMAL LOW (ref 12.0–15.0)
MCH: 28.8 pg (ref 26.0–34.0)
MCHC: 31.8 g/dL (ref 30.0–36.0)
MCV: 90.7 fL (ref 80.0–100.0)
Platelets: 253 10*3/uL (ref 150–400)
RBC: 3.64 MIL/uL — ABNORMAL LOW (ref 3.87–5.11)
RDW: 11.9 % (ref 11.5–15.5)
WBC: 13.4 10*3/uL — ABNORMAL HIGH (ref 4.0–10.5)
nRBC: 0 % (ref 0.0–0.2)

## 2020-11-21 LAB — RESP PANEL BY RT-PCR (FLU A&B, COVID) ARPGX2
Influenza A by PCR: NEGATIVE
Influenza B by PCR: NEGATIVE
SARS Coronavirus 2 by RT PCR: NEGATIVE

## 2020-11-21 LAB — D-DIMER, QUANTITATIVE: D-Dimer, Quant: 2.06 ug/mL-FEU — ABNORMAL HIGH (ref 0.00–0.50)

## 2020-11-21 MED ORDER — SODIUM CHLORIDE 0.9% FLUSH: 3.0000 mL | INTRAVENOUS | Status: DC | PRN

## 2020-11-21 MED ORDER — TECHNETIUM TC 99M TETROFOSMIN IV KIT
30.0000 | PACK | Freq: Once | INTRAVENOUS | Status: AC | PRN
  Administered 2020-11-21: 30 via INTRAVENOUS

## 2020-11-21 MED ORDER — REGADENOSON 0.4 MG/5ML IV SOLN
0.4000 mg | Freq: Once | INTRAVENOUS | Status: AC
  Filled 2020-11-21: qty 5

## 2020-11-21 MED ORDER — ONDANSETRON HCL 4 MG/2ML IJ SOLN: 4.0000 mg | Freq: Four times a day (QID) | INTRAMUSCULAR | Status: DC | PRN

## 2020-11-21 MED ORDER — ENOXAPARIN SODIUM 40 MG/0.4ML ~~LOC~~ SOLN
40.0000 mg | SUBCUTANEOUS | Status: DC
  Administered 2020-11-21: 40 mg via SUBCUTANEOUS
  Filled 2020-11-21: qty 0.4

## 2020-11-21 MED ORDER — VITAMIN B-12 1000 MCG PO TABS
1000.0000 ug | ORAL_TABLET | Freq: Every day | ORAL | Status: DC
  Administered 2020-11-21: 1000 ug via ORAL
  Filled 2020-11-21: qty 1

## 2020-11-21 MED ORDER — SODIUM CHLORIDE 0.9% FLUSH: 3.0000 mL | Freq: Two times a day (BID) | INTRAVENOUS | Status: DC

## 2020-11-21 MED ORDER — CITALOPRAM HYDROBROMIDE 20 MG PO TABS
20.0000 mg | ORAL_TABLET | ORAL | Status: DC
  Administered 2020-11-21: 20 mg via ORAL
  Filled 2020-11-21: qty 1

## 2020-11-21 MED ORDER — LOSARTAN POTASSIUM 50 MG PO TABS
50.0000 mg | ORAL_TABLET | ORAL | Status: DC
  Administered 2020-11-21: 50 mg via ORAL
  Filled 2020-11-21: qty 1

## 2020-11-21 MED ORDER — SODIUM CHLORIDE FLUSH 0.9 % IV SOLN
INTRAVENOUS | Status: AC
  Filled 2020-11-21: qty 10

## 2020-11-21 MED ORDER — TECHNETIUM TC 99M TETROFOSMIN IV KIT
10.0000 | PACK | Freq: Once | INTRAVENOUS | Status: AC | PRN
  Administered 2020-11-21: 10 via INTRAVENOUS

## 2020-11-21 MED ORDER — OXYBUTYNIN CHLORIDE 5 MG PO TABS
5.0000 mg | ORAL_TABLET | Freq: Every day | ORAL | Status: DC
  Administered 2020-11-21: 5 mg via ORAL
  Filled 2020-11-21: qty 1

## 2020-11-21 MED ORDER — REGADENOSON 0.4 MG/5ML IV SOLN
INTRAVENOUS | Status: AC
  Administered 2020-11-21: 0.4 mg via INTRAVENOUS
  Filled 2020-11-21: qty 5

## 2020-11-21 MED ORDER — KETOROLAC TROMETHAMINE 30 MG/ML IJ SOLN
15.0000 mg | Freq: Once | INTRAMUSCULAR | Status: AC
  Administered 2020-11-21: 15 mg via INTRAVENOUS
  Filled 2020-11-21: qty 1

## 2020-11-21 MED ORDER — SODIUM CHLORIDE 0.9 % IV SOLN: 250.0000 mL | INTRAVENOUS | Status: DC | PRN

## 2020-11-21 MED ORDER — NITROGLYCERIN 0.4 MG SL SUBL
0.4000 mg | SUBLINGUAL_TABLET | Freq: Once | SUBLINGUAL | Status: AC
  Administered 2020-11-21: 0.4 mg via SUBLINGUAL
  Filled 2020-11-21: qty 1

## 2020-11-21 MED ORDER — ACETAMINOPHEN 325 MG PO TABS: 650.0000 mg | ORAL_TABLET | Freq: Four times a day (QID) | ORAL | Status: DC | PRN

## 2020-11-21 MED ORDER — POTASSIUM CHLORIDE CRYS ER 20 MEQ PO TBCR
40.0000 meq | EXTENDED_RELEASE_TABLET | Freq: Once | ORAL | Status: AC
  Administered 2020-11-21: 40 meq via ORAL
  Filled 2020-11-21: qty 2

## 2020-11-21 MED ORDER — PANTOPRAZOLE SODIUM 40 MG PO TBEC
40.0000 mg | DELAYED_RELEASE_TABLET | Freq: Every day | ORAL | Status: DC
  Administered 2020-11-21: 40 mg via ORAL
  Filled 2020-11-21: qty 1

## 2020-11-21 MED ORDER — TRAMADOL HCL 50 MG PO TABS: 50.0000 mg | ORAL_TABLET | Freq: Four times a day (QID) | ORAL | Status: DC | PRN

## 2020-11-21 MED ORDER — ONDANSETRON HCL 4 MG PO TABS: 4.0000 mg | ORAL_TABLET | Freq: Four times a day (QID) | ORAL | Status: DC | PRN

## 2020-11-21 MED ORDER — IOHEXOL 350 MG/ML SOLN
100.0000 mL | Freq: Once | INTRAVENOUS | Status: AC | PRN
  Administered 2020-11-21: 100 mL via INTRAVENOUS

## 2020-11-21 MED ORDER — ACETAMINOPHEN 650 MG RE SUPP: 650.0000 mg | Freq: Four times a day (QID) | RECTAL | Status: DC | PRN

## 2020-11-21 NOTE — H&P (Signed)
History and Physical    Marie EkMary K Burrill EAV:[id number] DOB: [birth year] DOA: 11/21/2020  [facility]   Patient coming from: Home  Chief Complaint: Chest pain  HPI: Marie [name] is a 80 y.o. female with medical history significant for dyslipidemia, hypertension, GERD, fibromyalgia, and hiatal hernia who presented to the ED with complaints of left-sided chest pain and pressure that began approximately at 11:30 PM.  She also had some associated shortness of breath.  She was brought to the ED on account of symptoms that did not appear to have any relieving factors or exacerbating factors.  She was given some nitroglycerin and aspirin in the ED as well as Toradol with relief of her pain.  She states that she usually has pain similar to this resembles GERD, but this was more intense and longer lasting.  She states that she has had a stress test 1-2 years prior in [place] with her cardiologist Dr. Hyacinth MeekerMiller with no issues noted at that time.  She has chronic edema of her lower extremities denies any cough, fevers, chills, or other issues.   ED Course: Stable vital signs noted initial troponin negative.  EKG demonstrates mild ST changes in the lateral leads.  Potassium is 3.2.  CT of the chest for PE was negative.  Review of Systems: Reviewed as noted above, otherwise negative.  Past Medical History:  Diagnosis Date  . Anemia   . Collagenous colitis    colonoscopy 2009  . Depression   . Fibromyalgia   . GERD (gastroesophageal reflux disease)   . Hiatal hernia 2008   small  . Hypercholesterolemia   . Hypertension   . IBS (irritable bowel syndrome)   . Lymphocytic colitis    colonoscopy 2007, TCS 2013  . S/P endoscopy Sept 2008   small hh, s/p 7956 French dilator    Past Surgical History:  Procedure Laterality Date  . ABDOMINAL HYSTERECTOMY    . BIOPSY  01/09/2017   Procedure: BIOPSY;  Surgeon: Corbin Adeourk, Robert M, MD;  [facility];  Service:  Endoscopy;;  colon  . CATARACT EXTRACTION Bilateral 2021  . COLONOSCOPY  04/13/2008   Dr.Rourk- anal papilla and internal hemorrhage otherwise normal /left side diverticula  . COLONOSCOPY  09/20/2011   Dr.Rourk- Adequate preparation. Internal hemorrhoids; otherwise normal/ Left-sided diverticula, biopsies positive for lymphocytic colitis  . COLONOSCOPY N/A 01/09/2017   Procedure: COLONOSCOPY;  Surgeon: Corbin Adeourk, Robert M, MD;  [facility];  Service: Endoscopy;  Laterality: N/A;  7:30am  . ESOPHAGOGASTRODUODENOSCOPY  04/22/2007   Dr.Rourk- normal esophagus, small hiatal hernia o/w normal stomach, D1 and D2 s/p passage of a 56-French maloney dilator  . fistula of colon    . FLEXIBLE SIGMOIDOSCOPY  05/29/2006   Dr.Rehman- No evidence of pseudomembranous or acute colitis.  Marland Kitchen. FOOT SURGERY    . KNEE SURGERY    . NOSE SURGERY    . VEIN SURGERY Bilateral 2021     reports that she has never smoked. She has never used smokeless tobacco. She reports that she does not drink alcohol and does not use drugs.  Allergies  Allergen Reactions  . Codeine Hives and Swelling    Family History  Problem Relation Age of Onset  . Colon cancer Maternal Uncle   . Colon cancer Maternal Grandmother     Prior to Admission medications   Medication Sig Start Date End Date Taking? Authorizing Provider  Ascorbic Acid (VITAMIN C PO) Take 1 tablet by  mouth every morning.   Yes [provider]  CALCIUM PO Take 1 tablet by mouth every morning.   Yes [provider]  Cholecalciferol (VITAMIN D3) 1000 UNITS CAPS Take 1,000 Units by mouth daily.   Yes [provider]  citalopram (CELEXA) 20 MG tablet Take 20 mg by mouth every morning.   Yes [provider]  esomeprazole (NEXIUM) 40 MG capsule Take 40 mg by mouth daily.    Yes [provider]  losartan (COZAAR) 50 MG tablet Take 50 mg by mouth every morning.   Yes [provider]  Multiple Vitamins-Minerals  (CENTRUM SILVER ULTRA WOMENS PO) Take 1 tablet by mouth every morning.   Yes [provider]  nitrofurantoin, macrocrystal-monohydrate, (MACROBID) 100 MG capsule Take 100 mg by mouth 2 (two) times daily. Started 11/10/2020 Ending 11/21/2020 11/10/20  Yes [provider]  Omega-3 Fatty Acids (FISH OIL) 1000 MG CAPS Take 2 capsules by mouth daily.   Yes [provider]  oxybutynin (DITROPAN) 5 MG tablet Take 5 mg by mouth daily.   Yes [provider]  Probiotic Product (PROBIOTIC DAILY PO) Take 1 capsule by mouth daily.   Yes [provider]  traMADol (ULTRAM) 50 MG tablet Take 50 mg by mouth every 6 (six) hours as needed. For pain   Yes [provider]  vitamin B-12 (CYANOCOBALAMIN) 1000 MCG tablet Take 1,000 mcg by mouth daily.   Yes [provider]  dicyclomine (BENTYL) 10 MG capsule Take 1 capsule (10 mg total) by mouth 2 (two) times daily as needed for spasms. Patient not taking: No sig reported 05/18/20   Anice Paganini, NP  predniSONE (DELTASONE) 10 MG tablet Take 10 mg daily for 30 days, then decrease to 5 mg daily. Patient not taking: No sig reported 07/12/20   Rourk, Gerrit Friends, MD  predniSONE (DELTASONE) 5 MG tablet Start 5 mg daily after you complete  Prednisone 10 mg. Patient not taking: No sig reported 07/12/20   Corbin Ade, MD  predniSONE (DELTASONE) 5 MG tablet Take 1 5 mg tablet qod x 3 months Patient not taking: No sig reported 09/13/20   Corbin Ade, MD    Physical Exam: Vitals:   11/21/20 0637 11/21/20 0705 11/21/20 0738 11/21/20 0940  BP: (!) 150/89 129/89  (!) 135/57  Pulse: 67 66  71  Resp: 15 16  18   Temp:   (!) 97.5 F (36.4 C) 97.9 F (36.6 C)  TempSrc:   Oral Oral  SpO2: 97% 95%  93%  Weight:      Height:        Constitutional: NAD, calm, comfortable Vitals:   11/21/20 0637 11/21/20 0705 11/21/20 0738 11/21/20 0940  BP: (!) 150/89 129/89  (!) 135/57  Pulse: 67 66  71  Resp: 15 16  18   Temp:   (!)  97.5 F (36.4 C) 97.9 F (36.6 C)  TempSrc:   Oral Oral  SpO2: 97% 95%  93%  Weight:      Height:       Eyes: lids and conjunctivae normal Neck: normal, supple Respiratory: clear to auscultation bilaterally. Normal respiratory effort. No accessory muscle use.  Cardiovascular: Regular rate and rhythm, no murmurs. Abdomen: no tenderness, no distention. Bowel sounds positive.  Musculoskeletal:  No edema.  Tenderness to palpation over left chest wall. Skin: no rashes, lesions, ulcers.  Psychiatric: Flat affect  Labs on Admission: I have personally reviewed following labs and imaging studies  CBC: Recent  Labs  Lab 11/21/20 0300  WBC 13.4*  HGB 10.5*  HCT 33.0*  MCV 90.7  PLT 253   Basic Metabolic Panel: Recent Labs  Lab 11/21/20 0300  NA 136  K 3.2*  CL 101  CO2 23  GLUCOSE 128*  BUN 29*  CREATININE 1.03*  CALCIUM 9.0   GFR: Estimated Creatinine Clearance: 41.4 mL/min (A) (by C-G formula based on SCr of 1.03 mg/dL (H)). Liver Function Tests: No results for input(s): AST, ALT, ALKPHOS, BILITOT, PROT, ALBUMIN in the last 168 hours. No results for input(s): LIPASE, AMYLASE in the last 168 hours. No results for input(s): AMMONIA in the last 168 hours. Coagulation Profile: No results for input(s): INR, PROTIME in the last 168 hours. Cardiac Enzymes: No results for input(s): CKTOTAL, CKMB, CKMBINDEX, TROPONINI in the last 168 hours. BNP (last 3 results) No results for input(s): PROBNP in the last 8760 hours. HbA1C: No results for input(s): HGBA1C in the last 72 hours. CBG: No results for input(s): GLUCAP in the last 168 hours. Lipid Profile: No results for input(s): CHOL, HDL, LDLCALC, TRIG, CHOLHDL, LDLDIRECT in the last 72 hours. Thyroid Function Tests: No results for input(s): TSH, T4TOTAL, FREET4, T3FREE, THYROIDAB in the last 72 hours. Anemia Panel: No results for input(s): VITAMINB12, FOLATE, FERRITIN, TIBC, IRON, RETICCTPCT in the last 72 hours. Urine  analysis:    Component Value Date/Time   COLORURINE AMBER (A) 11/19/2016 1117   APPEARANCEUR HAZY (A) 11/19/2016 1117   LABSPEC 1.015 11/19/2016 1117   PHURINE 5.0 11/19/2016 1117   GLUCOSEU NEGATIVE 11/19/2016 1117   HGBUR NEGATIVE 11/19/2016 1117   BILIRUBINUR NEGATIVE 11/19/2016 1117   KETONESUR NEGATIVE 11/19/2016 1117   PROTEINUR 30 (A) 11/19/2016 1117   UROBILINOGEN 0.2 08/14/2011 1753   NITRITE NEGATIVE 11/19/2016 1117   LEUKOCYTESUR MODERATE (A) 11/19/2016 1117    Radiological Exams on Admission: CT Angio Chest PE W and/or Wo Contrast  Addendum Date: 11/21/2020   ADDENDUM REPORT: 11/21/2020 06:13 ADDENDUM: These results were called by telephone at the time of interpretation on 11/21/2020 at 6:13 am to provider [facility] , who verbally acknowledged these results. Electronically Signed   By: Kreg Shropshire M.D.   On: 11/21/2020 06:13   Result Date: 11/21/2020 CLINICAL DATA:  Chest and back pain, worse with inspiration which began at 23:30 EXAM: CT ANGIOGRAPHY CHEST WITH CONTRAST TECHNIQUE: Multidetector CT imaging of the chest was performed using the standard protocol during bolus administration of intravenous contrast. Multiplanar CT image reconstructions and MIPs were obtained to evaluate the vascular anatomy. CONTRAST:  OMNIPAQUE IOHEXOL 350 MG/ML SOLN COMPARISON:  CT abdomen pelvis 07/13/2016 FINDINGS: Cardiovascular: Satisfactory opacification the pulmonary arteries to the segmental level. No pulmonary artery filling defects are identified. Central pulmonary arteries are normal caliber. Normal heart size. No pericardial effusion. Coronary artery calcifications are present. The aortic root is suboptimally assessed given cardiac pulsation artifact. Atherosclerotic plaque within the normal caliber aorta. No acute luminal abnormality of the imaged aorta. No periaortic stranding or hemorrhage. Normal 3 vessel branching of the aortic arch. Proximal great vessels are mildly  calcified but otherwise free of acute abnormality. Mediastinum/Nodes: No mediastinal fluid or gas. Normal thyroid gland and thoracic inlet. No acute abnormality of the trachea. Sliding-type fluid-filled hiatal hernia. No worrisome mediastinal, hilar or axillary adenopathy. Lungs/Pleura: Trace bilateral effusions. Bilateral interlobular septal thickening and fissural thickening is seen with pulmonary vascular redistribution. Some subpleural reticular changes are noted bilaterally as well, could reflect early interstitial fibrotic change. No consolidative airspace  disease. No pneumothorax. No concerning pulmonary nodules or masses. Upper Abdomen: No acute abnormalities present in the visualized portions of the upper abdomen. Hiatal hernia, as above. Musculoskeletal: Diffuse bony demineralization. No concerning lytic or blastic lesions are seen. No acute fracture vertebral body height loss. Multilevel degenerative changes are present in the imaged portions of the spine. Exaggerated thoracic kyphosis. Additional degenerative changes in the bilateral shoulders. Review of the MIP images confirms the above findings. IMPRESSION: 1. No evidence of acute pulmonary embolism. 2. No acute aortic abnormality. 3. Trace bilateral effusions and features of pulmonary edema. 4. Some subpleural reticular changes are noted bilaterally as well, could reflect early interstitial fibrotic change. Consider evaluation with nonemergent outpatient high-resolution CT of the chest. 5. Sliding-type fluid-filled hiatal hernia. Correlate for symptoms of reflux. 6. Diffuse bony demineralization. 7. Aortic Atherosclerosis (ICD10-I70.0). Electronically Signed: By: Kreg Shropshire M.D. On: 11/21/2020 05:56   DG Chest Port 1 View  Result Date: 11/21/2020 CLINICAL DATA:  Shortness of breath, chest pain EXAM: PORTABLE CHEST 1 VIEW COMPARISON:  08/06/2011 FINDINGS: Heart is normal size. Diffuse interstitial prominence throughout the lungs. No effusions. No  acute bony abnormality. Aortic atherosclerosis. IMPRESSION: Diffuse interstitial prominence throughout the lungs, favor chronic interstitial lung disease. Electronically Signed   By: Charlett Nose M.D.   On: 11/21/2020 02:38    EKG: Independently reviewed. 73bpm, SR with mild inferior/lateral ST depressions.  Assessment/Plan Active Problems:   Chest pain    Atypical chest pain with dyspnea on exertion -Appreciate cardiology evaluation with plans for inpatient stress test and 2D echocardiogram -Keep n.p.o. for now -PE study negative -Concern for possible GERD/early herpes zoster versus ACS with heart score 7  Interstitial lung disease seen on CT -Plan for pulmonology follow-up in outpatient setting  Mild hypokalemia -Replete  Hypertension -Currently controlled -Continue home losartan, hold HCTZ for now  Dyslipidemia -Continue fish oil at home  GERD/chronic anemia -Continue outpatient GI follow-up  History of fibromyalgia -Continue home medications   DVT prophylaxis: Lovenox Code Status: Full Family Communication: Son at bedside 4/18 Disposition Plan: Admit for inpatient stress test Consults called: Cardiology Admission status: Observation, telemetry   Jerald Hennington D Casper Pagliuca DO [facility]  If 7PM-7AM, please contact night-coverage [email or web address]  11/21/2020, 10:30 AM

## 2020-11-21 NOTE — ED Notes (Signed)
Report given to Tori ,LPN on med surg 300.

## 2020-11-21 NOTE — ED Notes (Signed)
Dr. Shah at bedside.

## 2020-11-21 NOTE — ED Triage Notes (Addendum)
Pt arrived via EMS from home with c/o chest and back pain that is worse with inspiration that started 2330.   Pt was given nitro in route and reports some relief as well as 324 of aspirin

## 2020-11-21 NOTE — Consult Note (Addendum)
Cardiology Consultation:   Patient ID: Marie EkMary K Osmond MRN: [id number]; DOB: 1940-10-31  Admit date: 11/21/2020 Date of Consult: 11/21/2020  PCP:  [facility]   [facility]  Cardiologist:  Dina RichBranch, Johnavon Mcclafferty, MD  Advanced Practice Provider:  No care team member to display Electrophysiologist:  None   :[id number]}    Patient Profile:   Marie [name] is a 80 y.o. female with a hx of HTN, HLD, GERD   who is being seen today for the evaluation of chest pain at the request of Dr. Sherryll BurgerShah.  History of Present Illness:   Marie [name] is a 80 yo female with history of HTN, HLD, GERD who was admitted last night with chest pain. EKG nonspecific, troponin negative x1, CT negative for PE.  Patient ate Mexican steak and cheese yest and cake before she went to bed.  She developed chest pressure into her back and shortness of breath.  Some relief from nitroglycerin and aspirin but required Toradol in the ER.  She still has some chest pain currently and says it starting to feel more like her GERD.  She says she has had dyspnea on exertion for several years but worse over the past 2 weeks.  She is not active because of fibromyalgia and knee pain.  She also has chronic fibromyalgia pain in her left shoulder.  She had a stress test 2 years ago in [place] for similar symptoms.  She has an appointment with ENT for shortness of breath.  Mother died of MI at [phone] Sister has cardiac problems.  Patient has never smoked and no history of diabetes.   Past Medical History:  Diagnosis Date  . Anemia   . Collagenous colitis    colonoscopy 2009  . Depression   . Fibromyalgia   . GERD (gastroesophageal reflux disease)   . Hiatal hernia 2008   small  . Hypercholesterolemia   . Hypertension   . IBS (irritable bowel syndrome)   . Lymphocytic colitis    colonoscopy 2007, TCS 2013  . S/P endoscopy Sept 2008   small hh, s/p 6356 French dilator    Past Surgical History:   Procedure Laterality Date  . ABDOMINAL HYSTERECTOMY    . BIOPSY  01/09/2017   Procedure: BIOPSY;  Surgeon: Corbin Adeourk, Robert M, MD;  Location: [facility];  Service: Endoscopy;;  colon  . CATARACT EXTRACTION Bilateral 2021  . COLONOSCOPY  04/13/2008   Dr.Rourk- anal papilla and internal hemorrhage otherwise normal /left side diverticula  . COLONOSCOPY  09/20/2011   Dr.Rourk- Adequate preparation. Internal hemorrhoids; otherwise normal/ Left-sided diverticula, biopsies positive for lymphocytic colitis  . COLONOSCOPY N/A 01/09/2017   Procedure: COLONOSCOPY;  Surgeon: Corbin Adeourk, Robert M, MD;  Location: [facility];  Service: Endoscopy;  Laterality: N/A;  7:30am  . ESOPHAGOGASTRODUODENOSCOPY  04/22/2007   Dr.Rourk- normal esophagus, small hiatal hernia o/w normal stomach, D1 and D2 s/p passage of a 56-French maloney dilator  . fistula of colon    . FLEXIBLE SIGMOIDOSCOPY  05/29/2006   Dr.Rehman- No evidence of pseudomembranous or acute colitis.  Marland Kitchen. FOOT SURGERY    . KNEE SURGERY    . NOSE SURGERY    . VEIN SURGERY Bilateral 2021     Home Medications:  Prior to Admission medications   Medication Sig Start Date End Date Taking? Authorizing Provider  Ascorbic Acid (VITAMIN C PO) Take 1 tablet by mouth every morning.    [provider]  bismuth subsalicylate (PEPTO  BISMOL) 262 MG chewable tablet Chew 2-5 tablets by mouth as needed for diarrhea or loose stools.     [provider]  CALCIUM PO Take 1 tablet by mouth every morning.    [provider]  Cholecalciferol (VITAMIN D3) 1000 UNITS CAPS Take 1,000 Units by mouth daily.    [provider]  citalopram (CELEXA) 20 MG tablet Take 20 mg by mouth every morning.    [provider]  dicyclomine (BENTYL) 10 MG capsule Take 1 capsule (10 mg total) by mouth 2 (two) times daily as needed for spasms. Patient not taking: Reported on 09/13/2020 05/18/20   Anice Paganini, NP  esomeprazole (NEXIUM) 40 MG capsule Take 40 mg  by mouth daily.     [provider]  hydrochlorothiazide (MICROZIDE) 12.5 MG capsule Take 12.5 mg by mouth every morning.    [provider]  loperamide (IMODIUM) 2 MG capsule Take by mouth as needed for diarrhea or loose stools. Patient not taking: Reported on 09/13/2020    [provider]  losartan (COZAAR) 50 MG tablet Take 50 mg by mouth every morning.    [provider]  Multiple Vitamins-Minerals (CENTRUM SILVER ULTRA WOMENS PO) Take 1 tablet by mouth every morning.    [provider]  Omega-3 Fatty Acids (FISH OIL) 1000 MG CAPS Take 2 capsules by mouth daily.    [provider]  oxybutynin (DITROPAN) 5 MG tablet Take 5 mg by mouth daily.    [provider]  predniSONE (DELTASONE) 10 MG tablet Take 10 mg daily for 30 days, then decrease to 5 mg daily. Patient not taking: Reported on 09/13/2020 07/12/20   Corbin Ade, MD  predniSONE (DELTASONE) 5 MG tablet Start 5 mg daily after you complete  Prednisone 10 mg. 07/12/20   Corbin Ade, MD  predniSONE (DELTASONE) 5 MG tablet Take 1 5 mg tablet qod x 3 months 09/13/20   Rourk, Gerrit Friends, MD  Probiotic Product (PROBIOTIC DAILY PO) Take 1 capsule by mouth daily.    [provider]  traMADol (ULTRAM) 50 MG tablet Take 50 mg by mouth every 6 (six) hours as needed. For pain    [provider]  vitamin B-12 (CYANOCOBALAMIN) 1000 MCG tablet Take 1,000 mcg by mouth daily.    [provider]    Inpatient Medications: Scheduled Meds:  Continuous Infusions:  PRN Meds:   Allergies:    Allergies  Allergen Reactions  . Codeine Hives and Swelling    Social History:   Social History   Socioeconomic History  . Marital status: Widowed    Spouse name: Not on file  . Number of children: Not on file  . Years of education: Not on file  . Highest education level: Not on file  Occupational History  . Not on file  Tobacco Use  . Smoking status: Never Smoker   . Smokeless tobacco: Never Used  Vaping Use  . Vaping Use: Never used  Substance and Sexual Activity  . Alcohol use: No  . Drug use: No  . Sexual activity: Not on file  Other Topics Concern  . Not on file  Social History Narrative  . Not on file   Social Determinants of Health   Financial Resource Strain: Not on file  Food Insecurity: Not on file  Transportation Needs: Not on file  Physical Activity: Not on file  Stress: Not on file  Social Connections: Not on file  Intimate Partner Violence: Not on  file    Family History:     Family History  Problem Relation Age of Onset  . Colon cancer Maternal Uncle   . Colon cancer Maternal Grandmother      ROS:  Please see the history of present illness.  Review of Systems  Constitutional: Negative.  HENT: Negative.   Eyes: Negative.   Cardiovascular: Positive for chest pain, dyspnea on exertion and leg swelling.  Respiratory: Negative.   Hematologic/Lymphatic: Negative.   Musculoskeletal: Positive for joint swelling and myalgias. Negative for joint pain.  Gastrointestinal: Positive for heartburn.  Genitourinary: Negative.   Neurological: Positive for weakness.    All other ROS reviewed and negative.     Physical Exam/Data:   Vitals:   11/21/20 0555 11/21/20 0637 11/21/20 0705 11/21/20 0738  BP: (!) 145/62 (!) 150/89 129/89   Pulse: 73 67 66   Resp: 20 15 16    Temp:    (!) 97.5 F (36.4 C)  TempSrc:    Oral  SpO2: 99% 97% 95%   Weight:      Height:       No intake or output data in the 24 hours ending 11/21/20 0925 Last 3 Weights 11/21/2020 09/13/2020 07/12/2020  Weight (lbs) 155 lb 157 lb 9.6 oz 155 lb 12.8 oz  Weight (kg) 70.308 kg 71.487 kg 70.67 kg     Body mass index is 28.35 kg/m.  General:  Well nourished, well developed, in no acute distress HEENT: normal Lymph: no adenopathy Neck: no JVD Endocrine:  No thryomegaly Vascular: No carotid bruits; FA pulses 2+ bilaterally without bruits  Cardiac:   normal S1, S2; RRR; no murmur   Lungs: Crackles throughout the lungs worse at the bases Abd: soft, nontender, no hepatomegaly  Ext: no edema Musculoskeletal:  No deformities, BUE and BLE strength normal and equal Skin: warm and dry  Neuro:  CNs 2-12 intact, no focal abnormalities noted Psych:  Normal affect   EKG:  The EKG was personally reviewed and demonstrates: NSR with  Nonspecific ST changes Telemetry:  Telemetry was personally reviewed and demonstrates:  NSR with PAC's   Relevant CV Studies:    Laboratory Data:  High Sensitivity Troponin:   Recent Labs  Lab 11/21/20 0300 11/21/20 0819  TROPONINIHS 5 6     Chemistry Recent Labs  Lab 11/21/20 0300  NA 136  K 3.2*  CL 101  CO2 23  GLUCOSE 128*  BUN 29*  CREATININE 1.03*  CALCIUM 9.0  GFRNONAA 56*  ANIONGAP 12    No results for input(s): PROT, ALBUMIN, AST, ALT, ALKPHOS, BILITOT in the last 168 hours. Hematology Recent Labs  Lab 11/21/20 0300  WBC 13.4*  RBC 3.64*  HGB 10.5*  HCT 33.0*  MCV 90.7  MCH 28.8  MCHC 31.8  RDW 11.9  PLT 253   BNPNo results for input(s): BNP, PROBNP in the last 168 hours.  DDimer  Recent Labs  Lab 11/21/20 0300  DDIMER 2.06*     Radiology/Studies:  CT Angio Chest PE W and/or Wo Contrast  Addendum Date: 11/21/2020   ADDENDUM REPORT: 11/21/2020 06:13 ADDENDUM: These results were called by telephone at the time of interpretation on 11/21/2020 at 6:13 am to provider [facility] , who verbally acknowledged these results. Electronically Signed   By: [facility] M.D.   On: 11/21/2020 06:13   Result Date: 11/21/2020 CLINICAL DATA:  Chest and back pain, worse with inspiration which began at 23:30 EXAM: CT ANGIOGRAPHY CHEST WITH CONTRAST TECHNIQUE: Multidetector CT  imaging of the chest was performed using the standard protocol during bolus administration of intravenous contrast. Multiplanar CT image reconstructions and MIPs were obtained to evaluate the vascular anatomy.  CONTRAST:  OMNIPAQUE IOHEXOL 350 MG/ML SOLN COMPARISON:  CT abdomen pelvis 07/13/2016 FINDINGS: Cardiovascular: Satisfactory opacification the pulmonary arteries to the segmental level. No pulmonary artery filling defects are identified. Central pulmonary arteries are normal caliber. Normal heart size. No pericardial effusion. Coronary artery calcifications are present. The aortic root is suboptimally assessed given cardiac pulsation artifact. Atherosclerotic plaque within the normal caliber aorta. No acute luminal abnormality of the imaged aorta. No periaortic stranding or hemorrhage. Normal 3 vessel branching of the aortic arch. Proximal great vessels are mildly calcified but otherwise free of acute abnormality. Mediastinum/Nodes: No mediastinal fluid or gas. Normal thyroid gland and thoracic inlet. No acute abnormality of the trachea. Sliding-type fluid-filled hiatal hernia. No worrisome mediastinal, hilar or axillary adenopathy. Lungs/Pleura: Trace bilateral effusions. Bilateral interlobular septal thickening and fissural thickening is seen with pulmonary vascular redistribution. Some subpleural reticular changes are noted bilaterally as well, could reflect early interstitial fibrotic change. No consolidative airspace disease. No pneumothorax. No concerning pulmonary nodules or masses. Upper Abdomen: No acute abnormalities present in the visualized portions of the upper abdomen. Hiatal hernia, as above. Musculoskeletal: Diffuse bony demineralization. No concerning lytic or blastic lesions are seen. No acute fracture vertebral body height loss. Multilevel degenerative changes are present in the imaged portions of the spine. Exaggerated thoracic kyphosis. Additional degenerative changes in the bilateral shoulders. Review of the MIP images confirms the above findings. IMPRESSION: 1. No evidence of acute pulmonary embolism. 2. No acute aortic abnormality. 3. Trace bilateral effusions and features of pulmonary  edema. 4. Some subpleural reticular changes are noted bilaterally as well, could reflect early interstitial fibrotic change. Consider evaluation with nonemergent outpatient high-resolution CT of the chest. 5. Sliding-type fluid-filled hiatal hernia. Correlate for symptoms of reflux. 6. Diffuse bony demineralization. 7. Aortic Atherosclerosis (ICD10-I70.0). Electronically Signed: By: Kreg Shropshire M.D. On: 11/21/2020 05:56   DG Chest Port 1 View  Result Date: 11/21/2020 CLINICAL DATA:  Shortness of breath, chest pain EXAM: PORTABLE CHEST 1 VIEW COMPARISON:  08/06/2011 FINDINGS: Heart is normal size. Diffuse interstitial prominence throughout the lungs. No effusions. No acute bony abnormality. Aortic atherosclerosis. IMPRESSION: Diffuse interstitial prominence throughout the lungs, favor chronic interstitial lung disease. Electronically Signed   By: Charlett Nose M.D.   On: 11/21/2020 02:38     Assessment and Plan:   Chest pain and DOE, EKG unchanged, initial troponin negative, risk factors of hypertension, HLD, family history.  Await troponins and if negative could do lexiscan possibly today. Will keep NPO.Check 2D echo   DOE chronic but worse in the past 2 weeks.  Chest x-ray and CT with evidence of  interstitial lung disease-has never been a smoker-should see pulmonary  Hypokalemia replaced  HTN controlled on losartan and HCTZ at home  HLD on fish oil  HH with GERD with chronic anemia per patient sees Dr. Jena Gauss  Fibromyalgia   Risk Assessment/Risk Scores:     HEAR Score (for undifferentiated chest pain):  HEAR Score: 7      For questions or updates, please contact [facility] Please consult [email or web address] for contact info under    Signed, Jacolyn Reedy, PA-C  11/21/2020 9:25 AM   Attending note  Patient seen and discussed with PA Geni Bers, I agree with her documentation. 80 yo female history of fibromyalgia, HTN, GERD presents with chest pain and  SOB. Mixed symptoms in the sense  can be worst with deep breathing however did improve with NG, has also had some recent DOE over the last few weeks. Cardiac enzymes negative, EKG with some mild inferior/ lateral ST depressions and aVR elevation. No recent prior EKG to compare. Will obtain echo and lexiscan today for further workup. From notes already received ASA by EMS earlier this AM. CT PE negative.   Addendum 250pm Echo shows hyperdynamic LV no WMAs. Lexiscan is normal with no perfusion defects. No further cardiac testing planned at this time.    Dina Rich MD

## 2020-11-21 NOTE — Progress Notes (Signed)
*  PRELIMINARY RESULTS* Echocardiogram 2D Echocardiogram has been performed.  Stacey Drain 11/21/2020, 1:19 PM

## 2020-11-21 NOTE — ED Provider Notes (Signed)
[facility] EMERGENCY DEPARTMENT Provider Note   CSN: [id number] Arrival date & time: 11/21/20  0148     History Chief Complaint  Patient presents with  . Chest Pain    Marie [name] is a 80 y.o. female.  Patient with history of fibromyalgia, GERD, hiatal hernia, hypertension, depression presenting with left-sided chest and back pain.  Pain started about 11:30 PM while she was lying in bed.  Had has not yet gone to sleep.  Pain is worse with deep breathing and inspiration.  Somewhat improved with nitroglycerin and aspirin by EMS.  Pain is fairly constant but does not go away.  Is worse when she tries to breathing.  Feeling some shortness of breath as well.  No cough or fever.  No rash.  No fall or trauma.  No abdominal pain, nausea or vomiting.  No leg pain or leg swelling.  Denies any cardiac history.  Reports negative stress test many years ago.  Has been having cough and congestion for several days and concern for "allergies".  The history is provided by the patient and the EMS personnel.  Chest Pain Associated symptoms: shortness of breath   Associated symptoms: no abdominal pain, no dizziness, no fever, no headache, no nausea, no vomiting and no weakness        Past Medical History:  Diagnosis Date  . Anemia   . Collagenous colitis    colonoscopy 2009  . Depression   . Fibromyalgia   . GERD (gastroesophageal reflux disease)   . Hiatal hernia 2008   small  . Hypercholesterolemia   . Hypertension   . IBS (irritable bowel syndrome)   . Lymphocytic colitis    colonoscopy 2007, TCS 2013  . S/P endoscopy Sept 2008   small hh, s/p 35 French dilator    Patient Active Problem List   Diagnosis Date Noted  . Dehydration 01/04/2017  . LLQ pain 12/27/2015  . Weight loss 12/17/2011  . Lymphocytic colitis 11/21/2011  . LUQ pain 11/21/2011  . Diarrhea 04/30/2011  . ANEMIA, MILD 08/05/2008  . DEPRESSION 08/05/2008  . HYPERTENSION 08/05/2008  . GERD 08/05/2008  . IRRITABLE  BOWEL SYNDROME 08/05/2008  . Diffuse connective tissue disease (HCC) 08/05/2008  . ARTHRITIS 08/05/2008  . FIBROMYALGIA 08/05/2008  . ABDOMINAL BLOATING 08/05/2008  . FECAL INCONTINENCE 08/05/2008  . DIARRHEA, RECURRENT 08/05/2008  . ABDOMINAL PAIN, CHRONIC 08/05/2008  . DIVERTICULOSIS, COLON, HX OF 08/05/2008    Past Surgical History:  Procedure Laterality Date  . ABDOMINAL HYSTERECTOMY    . BIOPSY  01/09/2017   Procedure: BIOPSY;  Surgeon: Corbin Ade, MD;  Location: [facility];  Service: Endoscopy;;  colon  . CATARACT EXTRACTION Bilateral 2021  . COLONOSCOPY  04/13/2008   Dr.Rourk- anal papilla and internal hemorrhage otherwise normal /left side diverticula  . COLONOSCOPY  09/20/2011   Dr.Rourk- Adequate preparation. Internal hemorrhoids; otherwise normal/ Left-sided diverticula, biopsies positive for lymphocytic colitis  . COLONOSCOPY N/A 01/09/2017   Procedure: COLONOSCOPY;  Surgeon: Corbin Ade, MD;  Location: [facility];  Service: Endoscopy;  Laterality: N/A;  7:30am  . ESOPHAGOGASTRODUODENOSCOPY  04/22/2007   Dr.Rourk- normal esophagus, small hiatal hernia o/w normal stomach, D1 and D2 s/p passage of a 56-French maloney dilator  . fistula of colon    . FLEXIBLE SIGMOIDOSCOPY  05/29/2006   Dr.Rehman- No evidence of pseudomembranous or acute colitis.  Marland Kitchen FOOT SURGERY    . KNEE SURGERY    . NOSE SURGERY    . VEIN  SURGERY Bilateral 2021     OB History   No obstetric history on file.     Family History  Problem Relation Age of Onset  . Colon cancer Maternal Uncle   . Colon cancer Maternal Grandmother     Social History   Tobacco Use  . Smoking status: Never Smoker  . Smokeless tobacco: Never Used  Vaping Use  . Vaping Use: Never used  Substance Use Topics  . Alcohol use: No  . Drug use: No    Home Medications Prior to Admission medications   Medication Sig Start Date End Date Taking? Authorizing Provider  Ascorbic Acid (VITAMIN C PO) Take 1 tablet  by mouth every morning.    [provider]  bismuth subsalicylate (PEPTO BISMOL) 262 MG chewable tablet Chew 2-5 tablets by mouth as needed for diarrhea or loose stools.     [provider]  CALCIUM PO Take 1 tablet by mouth every morning.    [provider]  Cholecalciferol (VITAMIN D3) 1000 UNITS CAPS Take 1,000 Units by mouth daily.    [provider]  citalopram (CELEXA) 20 MG tablet Take 20 mg by mouth every morning.    [provider]  dicyclomine (BENTYL) 10 MG capsule Take 1 capsule (10 mg total) by mouth 2 (two) times daily as needed for spasms. Patient not taking: Reported on 09/13/2020 05/18/20   Anice PaganiniGill, Eric A, NP  esomeprazole (NEXIUM) 40 MG capsule Take 40 mg by mouth daily.     [provider]  hydrochlorothiazide (MICROZIDE) 12.5 MG capsule Take 12.5 mg by mouth every morning.    [provider]  loperamide (IMODIUM) 2 MG capsule Take by mouth as needed for diarrhea or loose stools. Patient not taking: Reported on 09/13/2020    [provider]  losartan (COZAAR) 50 MG tablet Take 50 mg by mouth every morning.    [provider]  Multiple Vitamins-Minerals (CENTRUM SILVER ULTRA WOMENS PO) Take 1 tablet by mouth every morning.    [provider]  Omega-3 Fatty Acids (FISH OIL) 1000 MG CAPS Take 2 capsules by mouth daily.    [provider]  oxybutynin (DITROPAN) 5 MG tablet Take 5 mg by mouth daily.    [provider]  predniSONE (DELTASONE) 10 MG tablet Take 10 mg daily for 30 days, then decrease to 5 mg daily. Patient not taking: Reported on 09/13/2020 07/12/20   Corbin Adeourk, Robert M, MD  predniSONE (DELTASONE) 5 MG tablet Start 5 mg daily after you complete  Prednisone 10 mg. 07/12/20   Corbin Adeourk, Robert M, MD  predniSONE (DELTASONE) 5 MG tablet Take 1 5 mg tablet qod x 3 months 09/13/20   Rourk, Gerrit Friendsobert M, MD  Probiotic Product (PROBIOTIC DAILY PO) Take 1 capsule by mouth daily.    [provider]  traMADol (ULTRAM) 50 MG tablet Take 50 mg by mouth every 6 (six) hours as needed. For pain    [provider]  vitamin B-12 (CYANOCOBALAMIN) 1000 MCG tablet Take 1,000 mcg by mouth daily.    [provider]    Allergies    Codeine  Review of Systems   Review of Systems  Constitutional: Negative for activity change, appetite change and fever.  HENT: Negative for congestion and rhinorrhea.   Respiratory: Positive for chest tightness and shortness of breath.   Cardiovascular: Positive for chest pain.  Gastrointestinal: Negative for abdominal pain, nausea and vomiting.  Genitourinary: Negative for dysuria and hematuria.  Musculoskeletal: Negative  for arthralgias and myalgias.  Skin: Negative for rash.  Neurological: Negative for dizziness, weakness and headaches.   all other systems are negative except as noted in the HPI and PMH.    Physical Exam Updated Vital Signs BP 136/63   Pulse 87   Temp 97.9 F (36.6 C) (Oral)   Resp 16   Ht  (1.575 m)   Wt 70.3 kg   SpO2 90%   BMI 28.35 kg/m   Physical Exam Vitals and nursing note reviewed.  Constitutional:      General: She is not in acute distress.    Appearance: She is well-developed.  HENT:     Head: Normocephalic and atraumatic.     Mouth/Throat:     Pharynx: No oropharyngeal exudate.  Eyes:     Conjunctiva/sclera: Conjunctivae normal.     Pupils: Pupils are equal, round, and reactive to light.  Neck:     Comments: No meningismus. Cardiovascular:     Rate and Rhythm: Normal rate and regular rhythm.     Heart sounds: Normal heart sounds. No murmur heard.     Comments: Equal radial pulses and grip strength.  Blood pressure 133 systolic on the right arm, 126 systolic on the left arm. Pulmonary:     Effort: Pulmonary effort is normal. No respiratory distress.     Breath sounds: Normal breath sounds.     Comments: Some tenderness to left chest wall that does not reproduce her pain.   Pain is underneath her left breast and radiated to her back.  There is no rash. Chest:     Chest wall: Tenderness present.  Abdominal:     Palpations: Abdomen is soft.     Tenderness: There is no abdominal tenderness. There is no guarding or rebound.  Musculoskeletal:        General: No tenderness. Normal range of motion.     Cervical back: Normal range of motion and neck supple.  Skin:    General: Skin is warm.  Neurological:     Mental Status: She is alert and oriented to person, place, and time.     Cranial Nerves: No cranial nerve deficit.     Motor: No abnormal muscle tone.     Coordination: Coordination normal.     Comments: No ataxia on finger to nose bilaterally. No pronator drift. 5/5 strength throughout. CN 2-12 intact.Equal grip strength. Sensation intact.   Psychiatric:        Behavior: Behavior normal.     ED Results / Procedures / Treatments   Labs (all labs ordered are listed, but only abnormal results are displayed) Labs Reviewed  BASIC METABOLIC PANEL - Abnormal; Notable for the following components:      Result Value   Potassium 3.2 (*)    Glucose, Bld 128 (*)    BUN 29 (*)    Creatinine, Ser 1.03 (*)    GFR, Estimated 56 (*)    All other components within normal limits  CBC - Abnormal; Notable for the following components:   WBC 13.4 (*)    RBC 3.64 (*)    Hemoglobin 10.5 (*)    HCT 33.0 (*)    All other components within normal limits  D-DIMER, QUANTITATIVE - Abnormal; Notable for the following components:   D-Dimer, Quant 2.06 (*)    All other components within normal limits  RESP PANEL BY RT-PCR (FLU A&B, COVID) ARPGX2  TROPONIN I (HIGH SENSITIVITY)  TROPONIN I (HIGH SENSITIVITY)    EKG  EKG Interpretation  Date/Time:  Monday November 21 2020 01:57:47 EDT Ventricular Rate:  73 PR Interval:  124 QRS Duration: 86 QT Interval:  416 QTC Calculation: 459 R Axis:   35 Text Interpretation: Sinus rhythm Nonspecific repol abnormality, diffuse leads  Nonspecific ST abnormality Confirmed by Glynn Octave [phone]) on 11/21/2020 2:23:51 AM   Radiology CT Angio Chest PE W and/or Wo Contrast  Result Date: 11/21/2020 CLINICAL DATA:  Chest and back pain, worse with inspiration which began at 23:30 EXAM: CT ANGIOGRAPHY CHEST WITH CONTRAST TECHNIQUE: Multidetector CT imaging of the chest was performed using the standard protocol during bolus administration of intravenous contrast. Multiplanar CT image reconstructions and MIPs were obtained to evaluate the vascular anatomy. CONTRAST:  OMNIPAQUE IOHEXOL 350 MG/ML SOLN COMPARISON:  CT abdomen pelvis 07/13/2016 FINDINGS: Cardiovascular: Satisfactory opacification the pulmonary arteries to the segmental level. No pulmonary artery filling defects are identified. Central pulmonary arteries are normal caliber. Normal heart size. No pericardial effusion. Coronary artery calcifications are present. The aortic root is suboptimally assessed given cardiac pulsation artifact. Atherosclerotic plaque within the normal caliber aorta. No acute luminal abnormality of the imaged aorta. No periaortic stranding or hemorrhage. Normal 3 vessel branching of the aortic arch. Proximal great vessels are mildly calcified but otherwise free of acute abnormality. Mediastinum/Nodes: No mediastinal fluid or gas. Normal thyroid gland and thoracic inlet. No acute abnormality of the trachea. Sliding-type fluid-filled hiatal hernia. No worrisome mediastinal, hilar or axillary adenopathy. Lungs/Pleura: Trace bilateral effusions. Bilateral interlobular septal thickening and fissural thickening is seen with pulmonary vascular redistribution. Some subpleural reticular changes are noted bilaterally as well, could reflect early interstitial fibrotic change. No consolidative airspace disease. No pneumothorax. No concerning pulmonary nodules or masses. Upper Abdomen: No acute abnormalities present in the visualized portions of the upper abdomen. Hiatal  hernia, as above. Musculoskeletal: Diffuse bony demineralization. No concerning lytic or blastic lesions are seen. No acute fracture vertebral body height loss. Multilevel degenerative changes are present in the imaged portions of the spine. Exaggerated thoracic kyphosis. Additional degenerative changes in the bilateral shoulders. Review of the MIP images confirms the above findings. IMPRESSION: 1. No evidence of acute pulmonary embolism. 2. No acute aortic abnormality. 3. Trace bilateral effusions and features of pulmonary edema. 4. Some subpleural reticular changes are noted bilaterally as well, could reflect early interstitial fibrotic change. Consider evaluation with nonemergent outpatient high-resolution CT of the chest. 5. Sliding-type fluid-filled hiatal hernia. Correlate for symptoms of reflux. 6. Diffuse bony demineralization. 7. Aortic Atherosclerosis (ICD10-I70.0). Electronically Signed   By: Kreg Shropshire M.D.   On: 11/21/2020 05:56   DG Chest Port 1 View  Result Date: 11/21/2020 CLINICAL DATA:  Shortness of breath, chest pain EXAM: PORTABLE CHEST 1 VIEW COMPARISON:  08/06/2011 FINDINGS: Heart is normal size. Diffuse interstitial prominence throughout the lungs. No effusions. No acute bony abnormality. Aortic atherosclerosis. IMPRESSION: Diffuse interstitial prominence throughout the lungs, favor chronic interstitial lung disease. Electronically Signed   By: Charlett Nose M.D.   On: 11/21/2020 02:38    Procedures Procedures   Medications Ordered in ED Medications - No data to display  ED Course  I have reviewed the triage vital signs and the nursing notes.  Pertinent labs & imaging results that were available during my care of the patient were reviewed by me and considered in my medical decision making (see chart for details).    MDM Rules/Calculators/A&P  Pleuritic chest pain that has been fairly constant.  No history of similar.  EKG shows nonspecific ST  changes inferiorly laterally.  Some relief with aspirin and nitroglycerin.  Chest x-ray shows chronic appearing interstitial lung disease. Troponin is negative.  Patient still having pain which is more severe with inspiration.  D-dimer is elevated.  CTA shows no pulmonary embolism or aortic dissection.  Study not tailored for aortic dissection but adequate amount of contrast in aorta rules out any obvious dissection within the aortic arch or descending aorta.  D/w Dr. Elvera Maria.   Heart score is 5.  Patient believes she had a stress test maybe 2 years ago in [place]. Continues to have pain in her chest and back.  There is no rash though early zoster is considered.  Did have some relief with nitroglycerin Does have nonspecific EKG changes.   Will give dose of Toradol as well as GI cocktail. She is agreeable to observation admission for cardiac rule out.  Discussed with Dr. Carren Rang.   Final Clinical Impression(s) / ED Diagnoses Final diagnoses:  Chest pain    Rx / DC Orders ED Discharge Orders    None       Amenah Tucci, Jeannett Senior, MD 11/21/20 [phone]

## 2020-11-21 NOTE — Discharge Summary (Signed)
Physician Discharge Summary  Marie [name] HYQ:[id number] DOB: 12-13-1940 DOA: 11/21/2020  PCP: [facility]  Admit date: 11/21/2020  Discharge date: 11/21/2020  Admitted From:Home  Disposition:  Home  Recommendations for Outpatient Follow-up:  1. Follow up with PCP in 1-2 weeks 2. Continue on usual home medications  Home Health:None  Equipment/Devices:None  Discharge Condition:Stable  CODE STATUS: Full  Diet recommendation: Heart Healthy  Brief/Interim Summary: Marie [name] is a 80 y.o. female with medical history significant for dyslipidemia, hypertension, GERD, fibromyalgia, and hiatal hernia who presented to the ED with complaints of left-sided chest pain and pressure.  She was noted to have significant risk factors for heart disease and had undergone 2D echocardiogram as well as inpatient stress test with no acute findings noted.  She does have hyperdynamic LV function and LVEF was 70-75%.  Her chest pain symptoms may be attributed to musculoskeletal pain versus GERD.  There were no signs of PE on CT study either.  She is stable from cardiology standpoint for discharge and should follow up with her outpatient PCP as well as cardiology for further assessment as needed.  No other acute events noted during this brief admission.  Discharge Diagnoses:  Active Problems:   Chest pain  Principal discharge diagnosis: Atypical chest pain likely related to musculoskeletal versus GERD.  Discharge Instructions  Discharge Instructions    Diet - low sodium heart healthy   Complete by: As directed    Increase activity slowly   Complete by: As directed      Allergies as of 11/21/2020      Reactions   Codeine Hives, Swelling      Medication List    STOP taking these medications   dicyclomine 10 MG capsule Commonly known as: BENTYL   predniSONE 10 MG tablet Commonly known as: DELTASONE   predniSONE 5 MG tablet Commonly known as: DELTASONE     TAKE  these medications   CALCIUM PO Take 1 tablet by mouth every morning.   CENTRUM SILVER ULTRA WOMENS PO Take 1 tablet by mouth every morning.   citalopram 20 MG tablet Commonly known as: CELEXA Take 20 mg by mouth every morning.   esomeprazole 40 MG capsule Commonly known as: NEXIUM Take 40 mg by mouth daily.   Fish Oil 1000 MG Caps Take 2 capsules by mouth daily.   losartan 50 MG tablet Commonly known as: COZAAR Take 50 mg by mouth every morning.   nitrofurantoin (macrocrystal-monohydrate) 100 MG capsule Commonly known as: MACROBID Take 100 mg by mouth 2 (two) times daily. Started 11/10/2020 Ending 11/21/2020   oxybutynin 5 MG tablet Commonly known as: DITROPAN Take 5 mg by mouth daily.   PROBIOTIC DAILY PO Take 1 capsule by mouth daily.   traMADol 50 MG tablet Commonly known as: ULTRAM Take 50 mg by mouth every 6 (six) hours as needed. For pain   vitamin B-12 1000 MCG tablet Commonly known as: CYANOCOBALAMIN Take 1,000 mcg by mouth daily.   VITAMIN C PO Take 1 tablet by mouth every morning.   Vitamin D3 25 MCG (1000 UT) Caps Take 1,000 Units by mouth daily.       Follow-up Information    [facility] Schedule an appointment as soon as possible for a visit in 1 week(s).   Contact information: [address] [place] [address] [phone]              Allergies  Allergen Reactions  .  Codeine Hives and Swelling    Consultations:  Cardiology   Procedures/Studies: CT Angio Chest PE W and/or Wo Contrast  Addendum Date: 11/21/2020   ADDENDUM REPORT: 11/21/2020 06:13 ADDENDUM: These results were called by telephone at the time of interpretation on 11/21/2020 at 6:13 am to provider [facility] , who verbally acknowledged these results. Electronically Signed   By: Kreg ShropshirePrice  DeHay M.D.   On: 11/21/2020 06:13   Result Date: 11/21/2020 CLINICAL DATA:  Chest and back pain, worse with inspiration which began at 23:30 EXAM: CT  ANGIOGRAPHY CHEST WITH CONTRAST TECHNIQUE: Multidetector CT imaging of the chest was performed using the standard protocol during bolus administration of intravenous contrast. Multiplanar CT image reconstructions and MIPs were obtained to evaluate the vascular anatomy. CONTRAST:  100mL OMNIPAQUE IOHEXOL 350 MG/ML SOLN COMPARISON:  CT abdomen pelvis 07/13/2016 FINDINGS: Cardiovascular: Satisfactory opacification the pulmonary arteries to the segmental level. No pulmonary artery filling defects are identified. Central pulmonary arteries are normal caliber. Normal heart size. No pericardial effusion. Coronary artery calcifications are present. The aortic root is suboptimally assessed given cardiac pulsation artifact. Atherosclerotic plaque within the normal caliber aorta. No acute luminal abnormality of the imaged aorta. No periaortic stranding or hemorrhage. Normal 3 vessel branching of the aortic arch. Proximal great vessels are mildly calcified but otherwise free of acute abnormality. Mediastinum/Nodes: No mediastinal fluid or gas. Normal thyroid gland and thoracic inlet. No acute abnormality of the trachea. Sliding-type fluid-filled hiatal hernia. No worrisome mediastinal, hilar or axillary adenopathy. Lungs/Pleura: Trace bilateral effusions. Bilateral interlobular septal thickening and fissural thickening is seen with pulmonary vascular redistribution. Some subpleural reticular changes are noted bilaterally as well, could reflect early interstitial fibrotic change. No consolidative airspace disease. No pneumothorax. No concerning pulmonary nodules or masses. Upper Abdomen: No acute abnormalities present in the visualized portions of the upper abdomen. Hiatal hernia, as above. Musculoskeletal: Diffuse bony demineralization. No concerning lytic or blastic lesions are seen. No acute fracture vertebral body height loss. Multilevel degenerative changes are present in the imaged portions of the spine. Exaggerated  thoracic kyphosis. Additional degenerative changes in the bilateral shoulders. Review of the MIP images confirms the above findings. IMPRESSION: 1. No evidence of acute pulmonary embolism. 2. No acute aortic abnormality. 3. Trace bilateral effusions and features of pulmonary edema. 4. Some subpleural reticular changes are noted bilaterally as well, could reflect early interstitial fibrotic change. Consider evaluation with nonemergent outpatient high-resolution CT of the chest. 5. Sliding-type fluid-filled hiatal hernia. Correlate for symptoms of reflux. 6. Diffuse bony demineralization. 7. Aortic Atherosclerosis (ICD10-I70.0). Electronically Signed: By: Kreg ShropshirePrice  DeHay M.D. On: 11/21/2020 05:56   NM Myocar Multi W/Spect W/Wall Motion / EF  Result Date: 11/21/2020  There was no ST segment deviation noted during stress.  The study is normal. There are no perfusion defects.  This is a low risk study.  The left ventricular ejection fraction is hyperdynamic (>65%).    DG Chest Port 1 View  Result Date: 11/21/2020 CLINICAL DATA:  Shortness of breath, chest pain EXAM: PORTABLE CHEST 1 VIEW COMPARISON:  08/06/2011 FINDINGS: Heart is normal size. Diffuse interstitial prominence throughout the lungs. No effusions. No acute bony abnormality. Aortic atherosclerosis. IMPRESSION: Diffuse interstitial prominence throughout the lungs, favor chronic interstitial lung disease. Electronically Signed   By: Charlett NoseKevin  Dover M.D.   On: 11/21/2020 02:38   ECHOCARDIOGRAM COMPLETE  Result Date: 11/21/2020    ECHOCARDIOGRAM REPORT   Patient Name:   Marie [name] Date of Exam: 11/21/2020 Medical Rec #:  [id number]     Height:       62.0 in Accession #:    [id number]    Weight:       155.0 lb Date of Birth:  [birth year]     BSA:          1.715 m Patient Age:    80 years      BP:           138/59 mmHg Patient Gender: F             HR:           71 bpm. [facility] Procedure: 2D Echo, Cardiac Doppler and Color Doppler  Indications:     Chest Pain R07.9  History:         Patient has no prior history of Echocardiogram examinations.                  Risk Factors:Hypertension and Dyslipidemia. GERD.  Sonographer:     Celesta Gentile RCS Referring Phys:  [id number] Lamont Dowdy [facility] Diagnosing Phys: Dina Rich MD IMPRESSIONS  1. Left ventricular ejection fraction, by estimation, is 70 to 75%. The left ventricle has hyperdynamic function. The left ventricle has no regional wall motion abnormalities. Left ventricular diastolic parameters are indeterminate. Elevated left atrial  pressure.  2. Right ventricular systolic function is normal. The right ventricular size is normal. There is mildly elevated pulmonary artery systolic pressure.  3. Left atrial size was mildly dilated.  4. The mitral valve is normal in structure. No evidence of mitral valve regurgitation. No evidence of mitral stenosis.  5. The aortic valve is tricuspid. There is mild calcification of the aortic valve. There is mild thickening of the aortic valve. Aortic valve regurgitation is not visualized. No aortic stenosis is present.  6. Mild pulmonary HTN, PASP is 36 mmHg.  7. The inferior vena cava is normal in size with greater than 50% respiratory variability, suggesting right atrial pressure of 3 mmHg. FINDINGS  Left Ventricle: Left ventricular ejection fraction, by estimation, is 70 to 75%. The left ventricle has hyperdynamic function. The left ventricle has no regional wall motion abnormalities. The left ventricular internal cavity size was normal in size. There is no left ventricular hypertrophy. Left ventricular diastolic parameters are indeterminate. Elevated left atrial pressure. Right Ventricle: The right ventricular size is normal. No increase in right ventricular wall thickness. Right ventricular systolic function is normal. There is mildly elevated pulmonary artery systolic pressure. The tricuspid regurgitant velocity is 3.00  m/s, and with an assumed right atrial  pressure of 3 mmHg, the estimated right ventricular systolic pressure is 39.0 mmHg. Left Atrium: Left atrial size was mildly dilated. Right Atrium: Right atrial size was normal in size. Pericardium: There is no evidence of pericardial effusion. Mitral Valve: The mitral valve is normal in structure. No evidence of mitral valve regurgitation. No evidence of mitral valve stenosis. Tricuspid Valve: 33. The tricuspid valve is normal in structure. Tricuspid valve regurgitation is mild . No evidence of tricuspid stenosis. Aortic Valve: The aortic valve is tricuspid. There is mild calcification of the aortic valve. There is mild thickening of the aortic valve. There is mild aortic valve annular calcification. Aortic valve regurgitation is not visualized. No aortic stenosis  is present. Aortic valve mean gradient measures 7.5 mmHg. Aortic valve peak gradient measures 12.7 mmHg. Aortic valve area, by VTI measures 1.96 cm. Pulmonic Valve: The pulmonic valve was not well visualized. Pulmonic valve regurgitation is  not visualized. No evidence of pulmonic stenosis. Aorta: The aortic root is normal in size and structure. Pulmonary Artery: Mild pulmonary HTN, PASP is 36 mmHg. Venous: The inferior vena cava is normal in size with greater than 50% respiratory variability, suggesting right atrial pressure of 3 mmHg. IAS/Shunts: No atrial level shunt detected by color flow Doppler.  LEFT VENTRICLE PLAX 2D LVIDd:         4.20 cm  Diastology LVIDs:         2.80 cm  LV e' medial:    7.62 cm/s LV PW:         0.80 cm  LV E/e' medial:  15.7 LV IVS:        1.10 cm  LV e' lateral:   8.27 cm/s LVOT diam:     1.70 cm  LV E/e' lateral: 14.5 LV SV:         80 LV SV Index:   46 LVOT Area:     2.27 cm  RIGHT VENTRICLE RV S prime:     17.70 cm/s TAPSE (M-mode): 2.3 cm LEFT ATRIUM             Index       RIGHT ATRIUM           Index LA diam:        3.20 cm 1.87 cm/m  RA Area:     16.00 cm LA Vol (A2C):   48.9 ml 28.51 ml/m RA Volume:   40.30 ml   23.49 ml/m LA Vol (A4C):   51.1 ml 29.79 ml/m LA Biplane Vol: 51.6 ml 30.08 ml/m  AORTIC VALVE AV Area (Vmax):    2.29 cm AV Area (Vmean):   2.10 cm AV Area (VTI):     1.96 cm AV Vmax:           178.49 cm/s AV Vmean:          130.940 cm/s AV VTI:            0.406 m AV Peak Grad:      12.7 mmHg AV Mean Grad:      7.5 mmHg LVOT Vmax:         180.00 cm/s LVOT Vmean:        121.000 cm/s LVOT VTI:          0.351 m LVOT/AV VTI ratio: 0.87  AORTA Ao Root diam: 3.00 cm MITRAL VALVE                TRICUSPID VALVE MV Area (PHT): 2.87 cm     TR Peak grad:   36.0 mmHg MV Decel Time: 264 msec     TR Vmax:        300.00 cm/s MV E velocity: 120.00 cm/s MV A velocity: 121.00 cm/s  SHUNTS MV E/A ratio:  0.99         Systemic VTI:  0.35 m                             Systemic Diam: 1.70 cm Dina Rich MD Electronically signed by Dina Rich MD Signature Date/Time: 11/21/2020/2:43:36 PM    Final (Updated)       Discharge Exam: Vitals:   11/21/20 1041 11/21/20 1436  BP:  136/60  Pulse:  72  Resp:  18  Temp:  97.6 F (36.4 C)  SpO2: 93% 93%   Vitals:   11/21/20 0738 11/21/20 0940 11/21/20 1041 11/21/20 1436  BP:  (!) 135/57  136/60  Pulse:  71  72  Resp:  18  18  Temp: (!) 97.5 F (36.4 C) 97.9 F (36.6 C)  97.6 F (36.4 C)  TempSrc: Oral Oral  Oral  SpO2:  93% 93% 93%  Weight:      Height:        General: Pt is alert, awake, not in acute distress Cardiovascular: RRR, S1/S2 +, no rubs, no gallops Respiratory: CTA bilaterally, no wheezing, no rhonchi Abdominal: Soft, NT, ND, bowel sounds + Extremities: no edema, no cyanosis    The results of significant diagnostics from this hospitalization (including imaging, microbiology, ancillary and laboratory) are listed below for reference.     Microbiology: Recent Results (from the past 240 hour(s))  Resp Panel by RT-PCR (Flu A&B, Covid) Nasopharyngeal Swab     Status: None   Collection Time: 11/21/20  6:09 AM   Specimen: Nasopharyngeal  Swab; Nasopharyngeal(NP) swabs in vial transport medium  Result Value Ref Range Status   SARS Coronavirus 2 by RT PCR NEGATIVE NEGATIVE Final    Comment: (NOTE) SARS-CoV-2 target nucleic acids are NOT DETECTED.  The SARS-CoV-2 RNA is generally detectable in upper respiratory specimens during the acute phase of infection. The lowest concentration of SARS-CoV-2 viral copies this assay can detect is 138 copies/mL. A negative result does not preclude SARS-Cov-2 infection and should not be used as the sole basis for treatment or other patient management decisions. A negative result may occur with  improper specimen collection/handling, submission of specimen other than nasopharyngeal swab, presence of viral mutation(s) within the areas targeted by this assay, and inadequate number of viral copies(<138 copies/mL). A negative result must be combined with clinical observations, patient history, and epidemiological information. The expected result is Negative.  Fact Sheet for Patients:  BloggerCourse.com  Fact Sheet for Healthcare Providers:  SeriousBroker.it  This test is no t yet approved or cleared by the [place] FDA and  has been authorized for detection and/or diagnosis of SARS-CoV-2 by FDA under an Emergency Use Authorization (EUA). This EUA will remain  in effect (meaning this test can be used) for the duration of the COVID-19 declaration under Section 564(b)(1) of the Act, 21 U.S.C.section 360bbb-3(b)(1), unless the authorization is terminated  or revoked sooner.       Influenza A by PCR NEGATIVE NEGATIVE Final   Influenza B by PCR NEGATIVE NEGATIVE Final    Comment: (NOTE) The Xpert Xpress SARS-CoV-2/FLU/RSV plus assay is intended as an aid in the diagnosis of influenza from Nasopharyngeal swab specimens and should not be used as a sole basis for treatment. Nasal washings and aspirates are unacceptable for Xpert Xpress  SARS-CoV-2/FLU/RSV testing.  Fact Sheet for Patients: BloggerCourse.com  Fact Sheet for Healthcare Providers: SeriousBroker.it  This test is not yet approved or cleared by the [place] FDA and has been authorized for detection and/or diagnosis of SARS-CoV-2 by FDA under an Emergency Use Authorization (EUA). This EUA will remain in effect (meaning this test can be used) for the duration of the COVID-19 declaration under Section 564(b)(1) of the Act, 21 U.S.C. section 360bbb-3(b)(1), unless the authorization is terminated or revoked.  Performed at [facility], [address], [place], [place] [address]      Labs: BNP (last 3 results) No results for input(s): BNP in the last 8760 hours. Basic Metabolic Panel: Recent Labs  Lab 11/21/20 0300  NA 136  K 3.2*  CL 101  CO2 23  GLUCOSE 128*  BUN 29*  CREATININE 1.03*  CALCIUM 9.0   Liver Function Tests: No results for input(s): AST, ALT, ALKPHOS, BILITOT, PROT, ALBUMIN in the last 168 hours. No results for input(s): LIPASE, AMYLASE in the last 168 hours. No results for input(s): AMMONIA in the last 168 hours. CBC: Recent Labs  Lab 11/21/20 0300  WBC 13.4*  HGB 10.5*  HCT 33.0*  MCV 90.7  PLT 253   Cardiac Enzymes: No results for input(s): CKTOTAL, CKMB, CKMBINDEX, TROPONINI in the last 168 hours. BNP: Invalid input(s): POCBNP CBG: No results for input(s): GLUCAP in the last 168 hours. D-Dimer Recent Labs    11/21/20 0300  DDIMER 2.06*   Hgb A1c No results for input(s): HGBA1C in the last 72 hours. Lipid Profile No results for input(s): CHOL, HDL, LDLCALC, TRIG, CHOLHDL, LDLDIRECT in the last 72 hours. Thyroid function studies No results for input(s): TSH, T4TOTAL, T3FREE, THYROIDAB in the last 72 hours.  Invalid input(s): FREET3 Anemia work up No results for input(s): VITAMINB12, FOLATE, FERRITIN, TIBC, IRON, RETICCTPCT in the last 72  hours. Urinalysis    Component Value Date/Time   COLORURINE AMBER (A) 11/19/2016 1117   APPEARANCEUR HAZY (A) 11/19/2016 1117   LABSPEC 1.015 11/19/2016 1117   PHURINE 5.0 11/19/2016 1117   GLUCOSEU NEGATIVE 11/19/2016 1117   HGBUR NEGATIVE 11/19/2016 1117   BILIRUBINUR NEGATIVE 11/19/2016 1117   KETONESUR NEGATIVE 11/19/2016 1117   PROTEINUR 30 (A) 11/19/2016 1117   UROBILINOGEN 0.2 08/14/2011 1753   NITRITE NEGATIVE 11/19/2016 1117   LEUKOCYTESUR MODERATE (A) 11/19/2016 1117   Sepsis Labs Invalid input(s): PROCALCITONIN,  WBC,  LACTICIDVEN Microbiology Recent Results (from the past 240 hour(s))  Resp Panel by RT-PCR (Flu A&B, Covid) Nasopharyngeal Swab     Status: None   Collection Time: 11/21/20  6:09 AM   Specimen: Nasopharyngeal Swab; Nasopharyngeal(NP) swabs in vial transport medium  Result Value Ref Range Status   SARS Coronavirus 2 by RT PCR NEGATIVE NEGATIVE Final    Comment: (NOTE) SARS-CoV-2 target nucleic acids are NOT DETECTED.  The SARS-CoV-2 RNA is generally detectable in upper respiratory specimens during the acute phase of infection. The lowest concentration of SARS-CoV-2 viral copies this assay can detect is 138 copies/mL. A negative result does not preclude SARS-Cov-2 infection and should not be used as the sole basis for treatment or other patient management decisions. A negative result may occur with  improper specimen collection/handling, submission of specimen other than nasopharyngeal swab, presence of viral mutation(s) within the areas targeted by this assay, and inadequate number of viral copies(<138 copies/mL). A negative result must be combined with clinical observations, patient history, and epidemiological information. The expected result is Negative.  Fact Sheet for Patients:  BloggerCourse.com  Fact Sheet for Healthcare Providers:  SeriousBroker.it  This test is no t yet approved or  cleared by the [place] FDA and  has been authorized for detection and/or diagnosis of SARS-CoV-2 by FDA under an Emergency Use Authorization (EUA). This EUA will remain  in effect (meaning this test can be used) for the duration of the COVID-19 declaration under Section 564(b)(1) of the Act, 21 U.S.C.section 360bbb-3(b)(1), unless the authorization is terminated  or revoked sooner.       Influenza A by PCR NEGATIVE NEGATIVE Final   Influenza B by PCR NEGATIVE NEGATIVE Final    Comment: (NOTE) The Xpert Xpress SARS-CoV-2/FLU/RSV plus assay is intended as an aid in the diagnosis of influenza from Nasopharyngeal swab specimens and should not be used as  a sole basis for treatment. Nasal washings and aspirates are unacceptable for Xpert Xpress SARS-CoV-2/FLU/RSV testing.  Fact Sheet for Patients: BloggerCourse.com  Fact Sheet for Healthcare Providers: SeriousBroker.it  This test is not yet approved or cleared by the [place] FDA and has been authorized for detection and/or diagnosis of SARS-CoV-2 by FDA under an Emergency Use Authorization (EUA). This EUA will remain in effect (meaning this test can be used) for the duration of the COVID-19 declaration under Section 564(b)(1) of the Act, 21 U.S.C. section 360bbb-3(b)(1), unless the authorization is terminated or revoked.  Performed at [facility], [address], [place], [place] [address]      Time coordinating discharge: 35 minutes  SIGNED:   Erick Blinks, DO [facility] 11/21/2020, 3:39 PM  If 7PM-7AM, please contact night-coverage [email or web address]

## 2020-11-21 NOTE — ED Notes (Signed)
Patient transported to CT 

## 2020-11-21 NOTE — ED Notes (Signed)
Placed new purewick and diaper on pt with a new chuck.

## 2021-01-19 ENCOUNTER — Emergency Department (HOSPITAL_COMMUNITY): Payer: Medicare (Managed Care)

## 2021-01-19 ENCOUNTER — Other Ambulatory Visit: Payer: Self-pay

## 2021-01-19 ENCOUNTER — Emergency Department (HOSPITAL_COMMUNITY)
Admission: EM | Admit: 2021-01-19 | Discharge: 2021-01-19 | Disposition: A | Discharge status: Home / Self Care | Payer: Medicare (Managed Care) | Attending: Emergency Medicine | Admitting: Emergency Medicine

## 2021-01-19 ENCOUNTER — Encounter (HOSPITAL_COMMUNITY): Payer: Self-pay

## 2021-01-19 DIAGNOSIS — Z20822 Contact with and (suspected) exposure to covid-19: Secondary | ICD-10-CM | POA: Insufficient documentation

## 2021-01-19 DIAGNOSIS — M25562 Pain in left knee: Secondary | ICD-10-CM | POA: Insufficient documentation

## 2021-01-19 DIAGNOSIS — E876 Hypokalemia: Secondary | ICD-10-CM | POA: Insufficient documentation

## 2021-01-19 DIAGNOSIS — E871 Hypo-osmolality and hyponatremia: Secondary | ICD-10-CM | POA: Diagnosis not present

## 2021-01-19 DIAGNOSIS — K5792 Diverticulitis of intestine, part unspecified, without perforation or abscess without bleeding: Secondary | ICD-10-CM | POA: Diagnosis not present

## 2021-01-19 DIAGNOSIS — R197 Diarrhea, unspecified: Secondary | ICD-10-CM | POA: Diagnosis present

## 2021-01-19 DIAGNOSIS — Z79899 Other long term (current) drug therapy: Secondary | ICD-10-CM | POA: Diagnosis not present

## 2021-01-19 DIAGNOSIS — I10 Essential (primary) hypertension: Secondary | ICD-10-CM | POA: Diagnosis not present

## 2021-01-19 DIAGNOSIS — Z96652 Presence of left artificial knee joint: Secondary | ICD-10-CM | POA: Diagnosis not present

## 2021-01-19 LAB — CBC
HCT: 33.7 % — ABNORMAL LOW (ref 36.0–46.0)
Hemoglobin: 10.8 g/dL — ABNORMAL LOW (ref 12.0–15.0)
MCH: 29 pg (ref 26.0–34.0)
MCHC: 32 g/dL (ref 30.0–36.0)
MCV: 90.3 fL (ref 80.0–100.0)
Platelets: 202 10*3/uL (ref 150–400)
RBC: 3.73 MIL/uL — ABNORMAL LOW (ref 3.87–5.11)
RDW: 12.6 % (ref 11.5–15.5)
WBC: 8.3 10*3/uL (ref 4.0–10.5)
nRBC: 0 % (ref 0.0–0.2)

## 2021-01-19 LAB — COMPREHENSIVE METABOLIC PANEL
ALT: 13 U/L (ref 0–44)
AST: 22 U/L (ref 15–41)
Albumin: 3.3 g/dL — ABNORMAL LOW (ref 3.5–5.0)
Alkaline Phosphatase: 69 U/L (ref 38–126)
Anion gap: 7 (ref 5–15)
BUN: 17 mg/dL (ref 8–23)
CO2: 26 mmol/L (ref 22–32)
Calcium: 8.8 mg/dL — ABNORMAL LOW (ref 8.9–10.3)
Chloride: 100 mmol/L (ref 98–111)
Creatinine, Ser: 0.73 mg/dL (ref 0.44–1.00)
GFR, Estimated: >60 mL/min (ref >60)
Glucose, Bld: 121 mg/dL — ABNORMAL HIGH (ref 70–99)
Potassium: 2.9 mmol/L — ABNORMAL LOW (ref 3.5–5.1)
Sodium: 133 mmol/L — ABNORMAL LOW (ref 135–145)
Total Bilirubin: 0.5 mg/dL (ref 0.3–1.2)
Total Protein: 7.1 g/dL (ref 6.5–8.1)

## 2021-01-19 LAB — LIPASE, BLOOD: Lipase: 21 U/L (ref 11–51)

## 2021-01-19 LAB — RESP PANEL BY RT-PCR (FLU A&B, COVID) ARPGX2
Influenza A by PCR: NEGATIVE
Influenza B by PCR: NEGATIVE
SARS Coronavirus 2 by RT PCR: NEGATIVE

## 2021-01-19 MED ORDER — POTASSIUM CHLORIDE CRYS ER 20 MEQ PO TBCR
20.0000 meq | EXTENDED_RELEASE_TABLET | Freq: Once | ORAL | Status: AC
  Administered 2021-01-19: 20 meq via ORAL
  Filled 2021-01-19: qty 1

## 2021-01-19 MED ORDER — CIPROFLOXACIN IN D5W 400 MG/200ML IV SOLN
400.0000 mg | Freq: Once | INTRAVENOUS | Status: AC
  Administered 2021-01-19: 400 mg via INTRAVENOUS
  Filled 2021-01-19: qty 200

## 2021-01-19 MED ORDER — IOHEXOL 300 MG/ML  SOLN
100.0000 mL | Freq: Once | INTRAMUSCULAR | Status: AC | PRN
  Administered 2021-01-19: 100 mL via INTRAVENOUS

## 2021-01-19 MED ORDER — SODIUM CHLORIDE 0.9 % IV BOLUS
1000.0000 mL | Freq: Once | INTRAVENOUS | Status: AC
  Administered 2021-01-19: 1000 mL via INTRAVENOUS

## 2021-01-19 MED ORDER — CIPROFLOXACIN HCL 500 MG PO TABS: 500.0000 mg | ORAL_TABLET | Freq: Two times a day (BID) | ORAL | 0 refills | Status: AC

## 2021-01-19 MED ORDER — METRONIDAZOLE 500 MG PO TABS
500.0000 mg | ORAL_TABLET | Freq: Once | ORAL | Status: AC
  Administered 2021-01-19: 500 mg via ORAL
  Filled 2021-01-19: qty 1

## 2021-01-19 MED ORDER — METRONIDAZOLE 500 MG PO TABS: 500.0000 mg | ORAL_TABLET | Freq: Three times a day (TID) | ORAL | 0 refills | Status: AC

## 2021-01-19 NOTE — ED Triage Notes (Signed)
Pt with hx of microcolitis, states she has diarrhea (none today), follows GI for this issue, no n/v, able to eat and drink without difficulty. States generalized body aches. Temp running ~100 here. Also c/o left knee pain, hx of surgery and arthritis. States she has taken regular daily meds and a tramadol.

## 2021-01-19 NOTE — ED Notes (Signed)
Reminded pt I needed a urine 

## 2021-01-19 NOTE — Discharge Instructions (Signed)
You have an infection called diverticulitis which usually responds very well to the antibiotics you have been prescribed.  Take your next dose of these medications tomorrow morning.  I recommend a clear liquid diet for the next 1 to 2 days, this includes water, ginger ale, Jell-O, broth.  As you are nausea and pain improve you can advance your diet as tolerated.  Please get rechecked for any worsening symptoms as listed above.  Your knee x-ray is normal.  Please follow-up with your primary doctor for further evaluation if the symptoms persist.

## 2021-01-19 NOTE — ED Provider Notes (Signed)
[facility] EMERGENCY DEPARTMENT Provider Note   CSN: [id number] Arrival date & time: 01/19/21  1331     History Chief Complaint  Patient presents with   Abdominal Pain    Marie [name] is a 80 y.o. female with a history of collagenous colitis under the care of Dr. Jena Gauss, GERD, hypertension presenting with complaints of diarrhea which has been present for the past 4 to 5 days, also reporting left-sided abdominal soreness along with generalized body aches, myalgias, and reports has had low-grade fevers to 100.5.  She denies nausea or vomiting but has had a reduced appetite for the past several days.  She feels weak and dehydrated.  She does states she has been urinating fairly regularly however.  She also reports bilateral knee pain which started with her body aches, but the right knee has resolved.   She denies any injuries or falls.  Her left knee is particularly tender and she is having difficulty bending the knee secondary to pain.  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  She has taken Imodium for her diarrhea with perhaps some improvement as she has had no diarrhea today.  She denies bloody stools. The history is provided by the patient.      Past Medical History:  Diagnosis Date   Anemia    Collagenous colitis    colonoscopy 2009   Depression    Fibromyalgia    GERD (gastroesophageal reflux disease)    Hiatal hernia 2008   small   Hypercholesterolemia    Hypertension    IBS (irritable bowel syndrome)    Lymphocytic colitis    colonoscopy 2007, TCS 2013   S/P endoscopy Sept 2008   small hh, s/p 33 French dilator    Patient Active Problem List   Diagnosis Date Noted   Chest pain 11/21/2020   Dehydration 01/04/2017   LLQ pain 12/27/2015   Weight loss 12/17/2011   Lymphocytic colitis 11/21/2011   LUQ pain 11/21/2011   Diarrhea 04/30/2011   ANEMIA, MILD 08/05/2008   DEPRESSION 08/05/2008   HYPERTENSION 08/05/2008   GERD 08/05/2008    IRRITABLE BOWEL SYNDROME 08/05/2008   Diffuse connective tissue disease (HCC) 08/05/2008   ARTHRITIS 08/05/2008   FIBROMYALGIA 08/05/2008   ABDOMINAL BLOATING 08/05/2008   FECAL INCONTINENCE 08/05/2008   DIARRHEA, RECURRENT 08/05/2008   ABDOMINAL PAIN, CHRONIC 08/05/2008   DIVERTICULOSIS, COLON, HX OF 08/05/2008    Past Surgical History:  Procedure Laterality Date   ABDOMINAL HYSTERECTOMY     BIOPSY  01/09/2017   Procedure: BIOPSY;  Surgeon: Corbin Ade, MD;  Location: [facility];  Service: Endoscopy;;  colon   CATARACT EXTRACTION Bilateral 2021   COLONOSCOPY  04/13/2008   Dr.Rourk- anal papilla and internal hemorrhage otherwise normal /left side diverticula   COLONOSCOPY  09/20/2011   Dr.Rourk- Adequate preparation. Internal hemorrhoids; otherwise normal/ Left-sided diverticula, biopsies positive for lymphocytic colitis   COLONOSCOPY N/A 01/09/2017   Procedure: COLONOSCOPY;  Surgeon: Corbin Ade, MD;  Location: [facility];  Service: Endoscopy;  Laterality: N/A;  7:30am   ESOPHAGOGASTRODUODENOSCOPY  04/22/2007   Dr.Rourk- normal esophagus, small hiatal hernia o/w normal stomach, D1 and D2 s/p passage of a 56-French maloney dilator   fistula of colon     FLEXIBLE SIGMOIDOSCOPY  05/29/2006   Dr.Rehman- No evidence of pseudomembranous or acute colitis.   FOOT SURGERY     KNEE SURGERY     NOSE SURGERY  VEIN SURGERY Bilateral 2021     OB History   No obstetric history on file.     Family History  Problem Relation Age of Onset   Colon cancer Maternal Uncle    Colon cancer Maternal Grandmother     Social History   Tobacco Use   Smoking status: Never   Smokeless tobacco: Never  Vaping Use   Vaping Use: Never used  Substance Use Topics   Alcohol use: No   Drug use: No    Home Medications Prior to Admission medications   Medication Sig Start Date End Date Taking? Authorizing Provider  Ascorbic Acid (VITAMIN C PO) Take 1 tablet by mouth every morning.   Yes  [provider]  CALCIUM PO Take 1 tablet by mouth every morning.   Yes [provider]  Cholecalciferol (VITAMIN D3) 1000 UNITS CAPS Take 1,000 Units by mouth daily.   Yes [provider]  ciprofloxacin (CIPRO) 500 MG tablet Take 1 tablet (500 mg total) by mouth every 12 (twelve) hours for 7 days. 01/19/21 01/26/21 Yes Marianny Goris, Raynelle FanningJulie, PA-C  citalopram (CELEXA) 40 MG tablet Take 40 mg by mouth daily. 12/19/20  Yes [provider]  esomeprazole (NEXIUM) 40 MG capsule Take 40 mg by mouth daily.    Yes [provider]  hydrochlorothiazide (HYDRODIURIL) 12.5 MG tablet Take 12.5 mg by mouth daily. 12/19/20  Yes [provider]  losartan (COZAAR) 50 MG tablet Take 50 mg by mouth every morning.   Yes [provider]  metroNIDAZOLE (FLAGYL) 500 MG tablet Take 1 tablet (500 mg total) by mouth 3 (three) times daily for 7 days. 01/19/21 01/26/21 Yes Amere Iott, Raynelle FanningJulie, PA-C  Multiple Vitamins-Minerals (CENTRUM SILVER ULTRA WOMENS PO) Take 1 tablet by mouth every morning.   Yes [provider]  Omega-3 Fatty Acids (FISH OIL) 1000 MG CAPS Take 2 capsules by mouth daily.   Yes [provider]  oxybutynin (DITROPAN) 5 MG tablet Take 5 mg by mouth daily.   Yes [provider]  potassium chloride SA (KLOR-CON) 20 MEQ tablet Take 1 tablet (20 mEq total) by mouth 2 (two) times daily for 7 days. 01/20/21 01/27/21 Yes Aadin Gaut, Raynelle FanningJulie, PA-C  Probiotic Product (PROBIOTIC DAILY PO) Take 1 capsule by mouth daily.   Yes [provider]  traMADol (ULTRAM) 50 MG tablet Take 50 mg by mouth every 6 (six) hours as needed. For pain   Yes [provider]  vitamin B-12 (CYANOCOBALAMIN) 1000 MCG tablet Take 1,000 mcg by mouth daily.   Yes [provider]  nitrofurantoin, macrocrystal-monohydrate, (MACROBID) 100 MG capsule Take 100 mg by mouth 2 (two) times daily. Started 11/10/2020 Ending 11/21/2020 Patient not taking: Reported on 01/19/2021  11/10/20   [provider]    Allergies    Codeine  Review of Systems   Review of Systems  Constitutional:  Positive for appetite change, fatigue and fever.  HENT:  Negative for congestion and sore throat.   Eyes: Negative.   Respiratory:  Negative for chest tightness and shortness of breath.   Cardiovascular:  Negative for chest pain.  Gastrointestinal:  Positive for abdominal pain and diarrhea. Negative for abdominal distention, nausea and vomiting.  Genitourinary: Negative.  Negative for decreased urine volume and dysuria.  Musculoskeletal:  Positive for arthralgias. Negative for joint swelling.  Skin: Negative.  Negative for rash and wound.  Neurological:  Negative for dizziness, weakness, light-headedness, numbness and headaches.  Psychiatric/Behavioral: Negative.    All other systems reviewed and  are negative.  Physical Exam Updated Vital Signs BP (!) 144/61   Pulse 71   Temp 99 F (37.2 C) (Oral)   Resp 20   Ht 5\' 3"  (1.6 m)   Wt 68 kg   SpO2 100%   BMI 26.57 kg/m   Physical Exam Vitals and nursing note reviewed.  Constitutional:      Appearance: She is well-developed.  HENT:     Head: Normocephalic and atraumatic.     Mouth/Throat:     Mouth: Mucous membranes are dry.  Eyes:     Conjunctiva/sclera: Conjunctivae normal.  Cardiovascular:     Rate and Rhythm: Normal rate and regular rhythm.     Pulses:          Dorsalis pedis pulses are 2+ on the right side and 2+ on the left side.     Heart sounds: Normal heart sounds.  Pulmonary:     Effort: Pulmonary effort is normal.     Breath sounds: Normal breath sounds. No wheezing.  Abdominal:     General: Bowel sounds are normal. There is no distension.     Palpations: Abdomen is soft.     Tenderness: There is abdominal tenderness in the left upper quadrant and left lower quadrant. There is no guarding or rebound.     Comments: Mild tenderness in the left abdomen, no guarding or rebound, abdomen is soft  with normal bowel sounds.  Musculoskeletal:        General: Normal range of motion.     Cervical back: Normal range of motion.     Left knee: Bony tenderness present. No swelling, effusion or erythema.     Comments: Ttp medial joint line, no crepitus with ROM, no effusion, erythema, induration.    Skin:    General: Skin is warm and dry.  Neurological:     Mental Status: She is alert.    ED Results / Procedures / Treatments   Labs (all labs ordered are listed, but only abnormal results are displayed) Labs Reviewed  COMPREHENSIVE METABOLIC PANEL - Abnormal; Notable for the following components:      Result Value   Sodium 133 (*)    Potassium 2.9 (*)    Glucose, Bld 121 (*)    Calcium 8.8 (*)    Albumin 3.3 (*)    All other components within normal limits  CBC - Abnormal; Notable for the following components:   RBC 3.73 (*)    Hemoglobin 10.8 (*)    HCT 33.7 (*)    All other components within normal limits  RESP PANEL BY RT-PCR (FLU A&B, COVID) [id number]  LIPASE, BLOOD    EKG None  Radiology CT ABDOMEN PELVIS W CONTRAST  Addendum Date: 01/19/2021   ADDENDUM REPORT: 01/19/2021 20:32 ADDENDUM: Well-defined homogeneous cystic lesion in the right lower quadrant extraperitoneal space again noted but not described in the original report. This has been present since 2007 and was evaluated by MRI in 2017. The lesion has been slowly progressive over that time and measures 5.0 x 4.2 cm today compared to 5.1 x 3.7 cm on the 2017 exam. Imaging features remain compatible with a benign etiology. Electronically Signed   By: 2018 M.D.   On: 01/19/2021 20:32   Result Date: 01/19/2021 CLINICAL DATA:  Diarrhea. EXAM: CT ABDOMEN AND PELVIS WITH CONTRAST TECHNIQUE: Multidetector CT imaging of the abdomen and pelvis was performed using the standard protocol following bolus administration of intravenous contrast. CONTRAST:  01/21/2021 OMNIPAQUE IOHEXOL 300  MG/ML  SOLN COMPARISON:  07/13/2016  FINDINGS: Lower chest: Small to moderate hiatal hernia. Hepatobiliary: No suspicious focal abnormality within the liver parenchyma. Small area of low attenuation in the anterior liver, adjacent to the falciform ligament, is in a characteristic location for focal fatty deposition. There is no evidence for gallstones, gallbladder wall thickening, or pericholecystic fluid. No intrahepatic or extrahepatic biliary dilation. Pancreas: No focal mass lesion. No dilatation of the main duct. No intraparenchymal cyst. No peripancreatic edema. Spleen: No splenomegaly. No focal mass lesion. Adrenals/Urinary Tract: No adrenal nodule or mass. Tiny hypoattenuating lesions in both kidneys are too small to characterize but likely benign. No evidence for hydroureter. The urinary bladder appears normal for the degree of distention. Stomach/Bowel: Small to moderate hiatal hernia is new since prior. Duodenum is normally positioned as is the ligament of Treitz. No small bowel wall thickening. No small bowel dilatation. The terminal ileum is normal. The appendix is normal. Right and transverse colon are fluid-filled without wall thickening. There is relatively diffuse wall thickening in the sigmoid colon with left colonic diverticulosis. Posterior diverticulum in the mid sigmoid segment is ill-defined with adjacent edema/inflammation (axial 55/2 and well demonstrated on coronal 56/5) consistent with diverticulitis. No evidence for perforation or abscess. Vascular/Lymphatic: There is abdominal aortic atherosclerosis without aneurysm. There is no gastrohepatic or hepatoduodenal ligament lymphadenopathy. No retroperitoneal or mesenteric lymphadenopathy. No pelvic sidewall lymphadenopathy. Reproductive: Uterus surgically absent.  There is no adnexal mass. Other: No intraperitoneal free fluid. Musculoskeletal: Small paraumbilical hernia contains only fat. No worrisome lytic or sclerotic osseous abnormality. IMPRESSION: 1. Diffuse wall  thickening in the sigmoid colon with left colonic diverticulosis. Posterior diverticulum in the mid sigmoid segment is ill-defined with adjacent edema/inflammation consistent with diverticulitis. No evidence for perforation or abscess. 2. Small to moderate hiatal hernia, new since 2017. 3. Small paraumbilical hernia contains only fat. 4. Aortic Atherosclerosis (ICD10-I70.0). Electronically Signed: By: Kennith Center M.D. On: 01/19/2021 19:45   DG Knee Complete 4 Views Left  Result Date: 01/19/2021 CLINICAL DATA:  Left knee pain without injury. EXAM: LEFT KNEE - COMPLETE 4+ VIEW COMPARISON:  None. FINDINGS: Postoperative left total knee arthroplasty with patellar femoral component. Components appear well seated. No evidence of acute fracture or dislocation. Heterotopic ossification lateral to the left knee. No significant effusions. Vascular calcifications. IMPRESSION: Left total knee arthroplasty. Components appear well seated. No acute displaced fractures. Electronically Signed   By: Burman Nieves M.D.   On: 01/19/2021 22:03    Procedures Procedures   Medications Ordered in ED Medications  iohexol (OMNIPAQUE) 300 MG/ML solution 100 mL (100 mLs Intravenous Contrast Given 01/19/21 1923)  ciprofloxacin (CIPRO) IVPB 400 mg (0 mg Intravenous Stopped 01/19/21 2305)  potassium chloride SA (KLOR-CON) CR tablet 20 mEq (20 mEq Oral Given 01/19/21 2158)  metroNIDAZOLE (FLAGYL) tablet 500 mg (500 mg Oral Given 01/19/21 2159)  sodium chloride 0.9 % bolus 1,000 mL (0 mLs Intravenous Stopped 01/19/21 2305)    ED Course  I have reviewed the triage vital signs and the nursing notes.  Pertinent labs & imaging results that were available during my care of the patient were reviewed by me and considered in my medical decision making (see chart for details).  Clinical Course as of 01/20/21 0026  Thu Jan 19, 2021  [id number] 80 year old female complaining of some body aches and some lower left abdominal pain.  Also  complaining of pain in her knees left greater than right.  History of GI issues.  Lab work showing mild  hyponatremia no white count.  CT showing acute diverticulitis.  Given IV antibiotics.  Will p.o. trial and see if appropriate for discharge. [MB]    Clinical Course User Index [MB] Terrilee Files, MD   MDM Rules/Calculators/A&P                          Pt with a small focus of diverticulitis left abdomen, mild hyponatremia and hypokalemia.  She was given IV fluids to help replace the hyponatremia, po potassium and started on cipro and flagyl for diverticulitis.  She tolerated PO fluids prior to dc home.  She was given strict return precautions including worse pain, vomiting, fevers beyond the next 24 hours which would suggest need for admission.   Knee xray normal, no exam findings to suggest septic knee joint.  Advised close f/u with pcp for recheck if this sx persists.  Final Clinical Impression(s) / ED Diagnoses Final diagnoses:  Diverticulitis  Acute pain of left knee    Rx / DC Orders ED Discharge Orders          Ordered    potassium chloride SA (KLOR-CON) 20 MEQ tablet  2 times daily        01/20/21 0023    ciprofloxacin (CIPRO) 500 MG tablet  Every 12 hours        01/19/21 2256    metroNIDAZOLE (FLAGYL) 500 MG tablet  3 times daily        01/19/21 2256             Burgess Amor, PA-C 01/20/21 0027    Terrilee Files, MD 01/20/21 1143

## 2021-01-20 MED ORDER — POTASSIUM CHLORIDE CRYS ER 20 MEQ PO TBCR: 20.0000 meq | EXTENDED_RELEASE_TABLET | Freq: Two times a day (BID) | ORAL | 0 refills | Status: DC

## 2021-01-24 ENCOUNTER — Telehealth: Payer: Self-pay | Admitting: Internal Medicine

## 2021-01-24 ENCOUNTER — Ambulatory Visit: Payer: Medicare (Managed Care) | Admitting: Internal Medicine

## 2021-01-24 ENCOUNTER — Encounter: Payer: Self-pay | Admitting: Internal Medicine

## 2021-01-24 NOTE — Telephone Encounter (Addendum)
Spoke to pt.  She went to ER last week 01/19/2021.  Pt says she was diagnosed with diverticulitis infection.  Said she is taking cipro and metronidazole.  Missed ov today and rescheduled to 02/14/2021.  Wants to know if we can send in RX for prednisone to help.

## 2021-01-24 NOTE — Telephone Encounter (Signed)
Please call patient, she was scheduled today at 10 and came in late. We had no openings so I rescheduled her to 07/12 with dr Jena Gauss, she wants to speak to a nurse because she states she needs some medicine

## 2021-01-24 NOTE — Telephone Encounter (Signed)
Lmom for pt to call back. 

## 2021-01-25 NOTE — Telephone Encounter (Signed)
Called pt and informed her of recommendations.  Pt voiced understanding.

## 2021-01-26 ENCOUNTER — Other Ambulatory Visit: Payer: Self-pay

## 2021-01-26 ENCOUNTER — Emergency Department (HOSPITAL_COMMUNITY)
Admission: EM | Admit: 2021-01-26 | Discharge: 2021-01-26 | Disposition: A | Discharge status: Home / Self Care | Payer: Medicare (Managed Care) | Attending: Emergency Medicine | Admitting: Emergency Medicine

## 2021-01-26 ENCOUNTER — Encounter (HOSPITAL_COMMUNITY): Payer: Self-pay | Admitting: *Deleted

## 2021-01-26 DIAGNOSIS — R630 Anorexia: Secondary | ICD-10-CM | POA: Diagnosis not present

## 2021-01-26 DIAGNOSIS — R110 Nausea: Secondary | ICD-10-CM

## 2021-01-26 DIAGNOSIS — Z79899 Other long term (current) drug therapy: Secondary | ICD-10-CM | POA: Diagnosis not present

## 2021-01-26 DIAGNOSIS — K5792 Diverticulitis of intestine, part unspecified, without perforation or abscess without bleeding: Secondary | ICD-10-CM

## 2021-01-26 DIAGNOSIS — K219 Gastro-esophageal reflux disease without esophagitis: Secondary | ICD-10-CM | POA: Diagnosis not present

## 2021-01-26 DIAGNOSIS — I10 Essential (primary) hypertension: Secondary | ICD-10-CM | POA: Diagnosis not present

## 2021-01-26 DIAGNOSIS — R1084 Generalized abdominal pain: Secondary | ICD-10-CM | POA: Diagnosis present

## 2021-01-26 LAB — COMPREHENSIVE METABOLIC PANEL
ALT: 17 U/L (ref 0–44)
AST: 30 U/L (ref 15–41)
Albumin: 3.5 g/dL (ref 3.5–5.0)
Alkaline Phosphatase: 64 U/L (ref 38–126)
Anion gap: 7 (ref 5–15)
BUN: 14 mg/dL (ref 8–23)
CO2: 23 mmol/L (ref 22–32)
Calcium: 9.3 mg/dL (ref 8.9–10.3)
Chloride: 105 mmol/L (ref 98–111)
Creatinine, Ser: 0.81 mg/dL (ref 0.44–1.00)
GFR, Estimated: >60 mL/min (ref >60)
Glucose, Bld: 90 mg/dL (ref 70–99)
Potassium: 4.1 mmol/L (ref 3.5–5.1)
Sodium: 135 mmol/L (ref 135–145)
Total Bilirubin: 0.5 mg/dL (ref 0.3–1.2)
Total Protein: 6.9 g/dL (ref 6.5–8.1)

## 2021-01-26 LAB — CBC WITH DIFFERENTIAL/PLATELET
Abs Immature Granulocytes: 0.02 10*3/uL (ref 0.00–0.07)
Basophils Absolute: 0 10*3/uL (ref 0.0–0.1)
Basophils Relative: 1 %
Eosinophils Absolute: 0.2 10*3/uL (ref 0.0–0.5)
Eosinophils Relative: 2 %
HCT: 34.4 % — ABNORMAL LOW (ref 36.0–46.0)
Hemoglobin: 11 g/dL — ABNORMAL LOW (ref 12.0–15.0)
Immature Granulocytes: 0 %
Lymphocytes Relative: 41 %
Lymphs Abs: 2.8 10*3/uL (ref 0.7–4.0)
MCH: 28.9 pg (ref 26.0–34.0)
MCHC: 32 g/dL (ref 30.0–36.0)
MCV: 90.5 fL (ref 80.0–100.0)
Monocytes Absolute: 0.7 10*3/uL (ref 0.1–1.0)
Monocytes Relative: 10 %
Neutro Abs: 3.1 10*3/uL (ref 1.7–7.7)
Neutrophils Relative %: 46 %
Platelets: 330 10*3/uL (ref 150–400)
RBC: 3.8 MIL/uL — ABNORMAL LOW (ref 3.87–5.11)
RDW: 13.2 % (ref 11.5–15.5)
WBC: 6.7 10*3/uL (ref 4.0–10.5)
nRBC: 0 % (ref 0.0–0.2)

## 2021-01-26 LAB — LIPASE, BLOOD: Lipase: 28 U/L (ref 11–51)

## 2021-01-26 MED ORDER — ONDANSETRON 4 MG PO TBDP: 4.0000 mg | ORAL_TABLET | Freq: Three times a day (TID) | ORAL | 0 refills | Status: DC | PRN

## 2021-01-26 MED ORDER — SODIUM CHLORIDE 0.9 % IV BOLUS
1000.0000 mL | Freq: Once | INTRAVENOUS | Status: AC
  Administered 2021-01-26: 1000 mL via INTRAVENOUS

## 2021-01-26 NOTE — ED Triage Notes (Signed)
Advised by her PCP to come in for fulids

## 2021-01-26 NOTE — ED Provider Notes (Signed)
Emergency Medicine Provider Triage Evaluation Note  Marie [name] , a 80 y.o. female  was evaluated in triage.  Patient here under the advisement of her PCP for dehydration.  She was seen here in the emergency department on 01/19/2021 for abdominal pain and diarrhea.  She was found to have diverticulitis.  She was started on antibiotics which she is currently taking.  Her last dose is today.  She was seen by her medical provider and advised that she may be dehydrated and has lost approximately 10 pounds.  She states that she is continuing to have mild pain of her right abdomen and persistent diarrhea.  Initially she states her stools were brown in color but now or black.  No Pepto-Bismol.  She has taken Imodium without relief.  Denies vomiting, syncope, fever, and chills  Review of Systems  Positive: Weight loss, left sided abdominal pain, diarrhea, black stools Negative: Fever, vomiting, dizziness, syncope  Physical Exam  BP 138/69 (BP Location: Left Arm)   Pulse 70   Temp 98.5 F (36.9 C) (Oral)   Resp 15   SpO2 93%  Gen:   Awake, no distress   Resp:  Normal effort, lungs clear MSK:   Moves extremities without difficulty  Other:  Tenderness left abdomen.    Medical Decision Making  Medically screening exam initiated at 4:52 PM.  Appropriate orders placed.  Burt Ek was informed that the remainder of the evaluation will be completed by another provider, this initial triage assessment does not replace that evaluation, and the importance of remaining in the ED until their evaluation is complete.  Patient here for possible dehydration.  Seen on 01/19/2021 and treated with metronidazole and ciprofloxacin for diverticulitis.  Now having black stools.  She will need further evaluation emergency department.  Patient agreeable to plan.   Pauline Aus, PA-C 01/26/21 1659    Mancel Bale, MD 01/27/21 1046

## 2021-01-26 NOTE — ED Provider Notes (Signed)
[facility] EMERGENCY DEPARTMENT Provider Note   CSN: [id number] Arrival date & time: 01/26/21  1612     History Chief Complaint  Patient presents with   Dehydration    Marie [name] is a 80 y.o. female.  Pt reports she was diagnosed with diverticulitis on 6/16.  Pt reports she has had a poor appetite.  Pt's Md advised her to come here today for Iv fluids because of poor intake. Pt reports she is nauseated and things don't taste good.  Pt reports she has been on prednisone in the past for similar but Gi doctor did not want to start prednisone due to infection.  Pt reports she continues to have diarrhea in the am and at night.    The history is provided by the patient. No language interpreter was used.  Abdominal Pain Pain location:  Generalized Pain quality: aching   Chronicity:  New Relieved by:  Nothing Worsened by:  Nothing Associated symptoms: nausea   Risk factors: no alcohol abuse       Past Medical History:  Diagnosis Date   Anemia    Collagenous colitis    colonoscopy 2009   Depression    Fibromyalgia    GERD (gastroesophageal reflux disease)    Hiatal hernia 2008   small   Hypercholesterolemia    Hypertension    IBS (irritable bowel syndrome)    Lymphocytic colitis    colonoscopy 2007, TCS 2013   S/P endoscopy Sept 2008   small hh, s/p 26 French dilator    Patient Active Problem List   Diagnosis Date Noted   Chest pain 11/21/2020   Dehydration 01/04/2017   LLQ pain 12/27/2015   Weight loss 12/17/2011   Lymphocytic colitis 11/21/2011   LUQ pain 11/21/2011   Diarrhea 04/30/2011   ANEMIA, MILD 08/05/2008   DEPRESSION 08/05/2008   HYPERTENSION 08/05/2008   GERD 08/05/2008   IRRITABLE BOWEL SYNDROME 08/05/2008   Diffuse connective tissue disease (HCC) 08/05/2008   ARTHRITIS 08/05/2008   FIBROMYALGIA 08/05/2008   ABDOMINAL BLOATING 08/05/2008   FECAL INCONTINENCE 08/05/2008   DIARRHEA, RECURRENT 08/05/2008   ABDOMINAL PAIN, CHRONIC 08/05/2008    DIVERTICULOSIS, COLON, HX OF 08/05/2008    Past Surgical History:  Procedure Laterality Date   ABDOMINAL HYSTERECTOMY     BIOPSY  01/09/2017   Procedure: BIOPSY;  Surgeon: Corbin Ade, MD;  Location: [facility];  Service: Endoscopy;;  colon   CATARACT EXTRACTION Bilateral 2021   COLONOSCOPY  04/13/2008   Dr.Rourk- anal papilla and internal hemorrhage otherwise normal /left side diverticula   COLONOSCOPY  09/20/2011   Dr.Rourk- Adequate preparation. Internal hemorrhoids; otherwise normal/ Left-sided diverticula, biopsies positive for lymphocytic colitis   COLONOSCOPY N/A 01/09/2017   Procedure: COLONOSCOPY;  Surgeon: Corbin Ade, MD;  Location: [facility];  Service: Endoscopy;  Laterality: N/A;  7:30am   ESOPHAGOGASTRODUODENOSCOPY  04/22/2007   Dr.Rourk- normal esophagus, small hiatal hernia o/w normal stomach, D1 and D2 s/p passage of a 56-French maloney dilator   fistula of colon     FLEXIBLE SIGMOIDOSCOPY  05/29/2006   Dr.Rehman- No evidence of pseudomembranous or acute colitis.   FOOT SURGERY     KNEE SURGERY     NOSE SURGERY     VEIN SURGERY Bilateral 2021     OB History   No obstetric history on file.     Family History  Problem Relation Age of Onset   Colon cancer Maternal Uncle    Colon cancer Maternal Grandmother  Social History   Tobacco Use   Smoking status: Never   Smokeless tobacco: Never  Vaping Use   Vaping Use: Never used  Substance Use Topics   Alcohol use: No   Drug use: No    Home Medications Prior to Admission medications   Medication Sig Start Date End Date Taking? Authorizing Provider  Ascorbic Acid (VITAMIN C PO) Take 1 tablet by mouth every morning.    [provider]  CALCIUM PO Take 1 tablet by mouth every morning.    [provider]  Cholecalciferol (VITAMIN D3) 1000 UNITS CAPS Take 1,000 Units by mouth daily.    [provider]  ciprofloxacin (CIPRO) 500 MG tablet Take 1 tablet (500 mg total) by  mouth every 12 (twelve) hours for 7 days. 01/19/21 01/26/21  Burgess Amor, PA-C  citalopram (CELEXA) 40 MG tablet Take 40 mg by mouth daily. 12/19/20   [provider]  esomeprazole (NEXIUM) 40 MG capsule Take 40 mg by mouth daily.     [provider]  hydrochlorothiazide (HYDRODIURIL) 12.5 MG tablet Take 12.5 mg by mouth daily. 12/19/20   [provider]  losartan (COZAAR) 50 MG tablet Take 50 mg by mouth every morning.    [provider]  metroNIDAZOLE (FLAGYL) 500 MG tablet Take 1 tablet (500 mg total) by mouth 3 (three) times daily for 7 days. 01/19/21 01/26/21  Burgess Amor, PA-C  Multiple Vitamins-Minerals (CENTRUM SILVER ULTRA WOMENS PO) Take 1 tablet by mouth every morning.    [provider]  nitrofurantoin, macrocrystal-monohydrate, (MACROBID) 100 MG capsule Take 100 mg by mouth 2 (two) times daily. Started 11/10/2020 Ending 11/21/2020 Patient not taking: Reported on 01/19/2021 11/10/20   [provider]  Omega-3 Fatty Acids (FISH OIL) 1000 MG CAPS Take 2 capsules by mouth daily.    [provider]  oxybutynin (DITROPAN) 5 MG tablet Take 5 mg by mouth daily.    [provider]  potassium chloride SA (KLOR-CON) 20 MEQ tablet Take 1 tablet (20 mEq total) by mouth 2 (two) times daily for 7 days. 01/20/21 01/27/21  Burgess Amor, PA-C  Probiotic Product (PROBIOTIC DAILY PO) Take 1 capsule by mouth daily.    [provider]  traMADol (ULTRAM) 50 MG tablet Take 50 mg by mouth every 6 (six) hours as needed. For pain    [provider]  vitamin B-12 (CYANOCOBALAMIN) 1000 MCG tablet Take 1,000 mcg by mouth daily.    [provider]    Allergies    Codeine  Review of Systems   Review of Systems  Gastrointestinal:  Positive for abdominal pain and nausea.  All other systems reviewed and are negative.  Physical Exam Updated Vital Signs BP 135/79   Pulse 78   Temp 98.5 F (36.9 C) (Oral)   Resp 16   SpO2 97%    Physical Exam Vitals and nursing note reviewed.  Constitutional:      Appearance: She is well-developed.  HENT:     Head: Normocephalic.  Cardiovascular:     Rate and Rhythm: Normal rate and regular rhythm.  Pulmonary:     Effort: Pulmonary effort is normal.  Abdominal:     General: Abdomen is flat. There is no distension.  Musculoskeletal:        General: Normal range of motion.     Cervical back: Normal range of motion.  Skin:    General: Skin is warm.  Neurological:     General: No focal deficit present.  Mental Status: She is alert and oriented to person, place, and time.  Psychiatric:        Mood and Affect: Mood normal.    ED Results / Procedures / Treatments   Labs (all labs ordered are listed, but only abnormal results are displayed) Labs Reviewed  CBC WITH DIFFERENTIAL/PLATELET - Abnormal; Notable for the following components:      Result Value   RBC 3.80 (*)    Hemoglobin 11.0 (*)    HCT 34.4 (*)    All other components within normal limits  COMPREHENSIVE METABOLIC PANEL  LIPASE, BLOOD  POC OCCULT BLOOD, ED    EKG None  Radiology No results found.  Procedures Procedures   Medications Ordered in ED Medications  sodium chloride 0.9 % bolus 1,000 mL (1,000 mLs Intravenous New Bag/Given 01/26/21 1953)    ED Course  I have reviewed the triage vital signs and the nursing notes.  Pertinent labs & imaging results that were available during my care of the patient were reviewed by me and considered in my medical decision making (see chart for details).    MDM Rules/Calculators/A&P                          MDM:  labs reviewed,  Pt given Iv fluids x 1 liter.  No laboratory signs of dehydration.  Pt advised to finish antibiotic.Pt encouraged to eat and drink.  Vitals ar normal.  Pt given rx for zofran.  I advised follow up with primary care for recheck  Final Clinical Impression(s) / ED Diagnoses Final diagnoses:  Diverticulitis  Nausea    Rx /  DC Orders ED Discharge Orders     None     An After Visit Summary was printed and given to the patient.    Elson Areas, [place] 01/27/21 1155    Mancel Bale, MD 01/27/21 1046

## 2021-01-26 NOTE — ED Notes (Signed)
Called son Milus Glazier at CB# [phone], DID NOT discuss plan of care nor disclose any details of any care while in the ED as requested by patient. Son is coming to get patient.

## 2021-02-14 ENCOUNTER — Encounter: Payer: Self-pay | Admitting: Internal Medicine

## 2021-02-14 ENCOUNTER — Telehealth: Payer: Self-pay | Admitting: Internal Medicine

## 2021-02-14 ENCOUNTER — Ambulatory Visit: Payer: Medicare (Managed Care) | Admitting: Internal Medicine

## 2021-02-14 ENCOUNTER — Other Ambulatory Visit: Payer: Self-pay

## 2021-02-14 VITALS — BP 157/69 | HR 80 | Temp 97.7°F | Ht 63.0 in | Wt 148.2 lb

## 2021-02-14 DIAGNOSIS — K219 Gastro-esophageal reflux disease without esophagitis: Secondary | ICD-10-CM | POA: Diagnosis not present

## 2021-02-14 DIAGNOSIS — K5732 Diverticulitis of large intestine without perforation or abscess without bleeding: Secondary | ICD-10-CM | POA: Diagnosis not present

## 2021-02-14 DIAGNOSIS — K52831 Collagenous colitis: Secondary | ICD-10-CM | POA: Diagnosis not present

## 2021-02-14 NOTE — Patient Instructions (Signed)
Continue Nexium 40 mg daily  Information on a high-fiber low-fat diet  Patient is to call back later today with the Metamucil preparation she is taking listing the fiber content in each caplet.  No need for further antibiotics or prednisone at this time  If symptoms deteriorate between now and her next office visit she is to call immediately  Plan for office visit in 3 months

## 2021-02-14 NOTE — Telephone Encounter (Signed)
[phone]  patient was supposed to call and say how much fiber she was taking in pill form and she can not figure it out

## 2021-02-14 NOTE — Progress Notes (Signed)
Primary Care Physician:  [facility] Primary Gastroenterologist:  Dr. Jena Gauss  Pre-Procedure History & Physical: HPI:  Marie [name] is a 80 y.o. female here for follow-up of collagenous colitis.  Since she was seen here she has been to the ER twice.  Once her chest pain which was felt to be a flare of GERD.  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  She was treated with a 7-day course of Cipro and Flagyl.  She states her pain is steadily improved although its not quite resolved (3/10).  No fever.  No nausea or vomiting -  she is able to eat.  Her GERD is well controlled.  No dysphagia.  Last colonoscopy 2018 -  sigmoid diverticulosis.  Since being seen in February, she came down on a very slow prednisone taper-finally off.  Bowel function was doing very good until her bout of acute diverticulitis as she reports.  Past Medical History:  Diagnosis Date   Anemia    Collagenous colitis    colonoscopy 2009   Depression    Fibromyalgia    GERD (gastroesophageal reflux disease)    Hiatal hernia 2008   small   Hypercholesterolemia    Hypertension    IBS (irritable bowel syndrome)    Lymphocytic colitis    colonoscopy 2007, TCS 2013   S/P endoscopy Sept 2008   small hh, s/p 97 French dilator    Past Surgical History:  Procedure Laterality Date   ABDOMINAL HYSTERECTOMY     BIOPSY  01/09/2017   Procedure: BIOPSY;  Surgeon: Corbin Ade, MD;  Location: [facility];  Service: Endoscopy;;  colon   CATARACT EXTRACTION Bilateral 2021   COLONOSCOPY  04/13/2008   Dr.Helem Reesor- anal papilla and internal hemorrhage otherwise normal /left side diverticula   COLONOSCOPY  09/20/2011   Dr.Taytum Wheller- Adequate preparation. Internal hemorrhoids; otherwise normal/ Left-sided diverticula, biopsies positive for lymphocytic colitis   COLONOSCOPY N/A 01/09/2017    Procedure: COLONOSCOPY;  Surgeon: Corbin Ade, MD;  Location: [facility];  Service: Endoscopy;  Laterality: N/A;  7:30am   ESOPHAGOGASTRODUODENOSCOPY  04/22/2007   Dr.Parthena Fergeson- normal esophagus, small hiatal hernia o/w normal stomach, D1 and D2 s/p passage of a 56-French maloney dilator   fistula of colon     FLEXIBLE SIGMOIDOSCOPY  05/29/2006   Dr.Rehman- No evidence of pseudomembranous or acute colitis.   FOOT SURGERY     KNEE SURGERY     NOSE SURGERY     VEIN SURGERY Bilateral 2021    Prior to Admission medications   Medication Sig Start Date End Date Taking? Authorizing Provider  Ascorbic Acid (VITAMIN C PO) Take 1 tablet by mouth every morning.   Yes [provider]  CALCIUM PO Take 1 tablet by mouth every morning.   Yes [provider]  Cholecalciferol (VITAMIN D3) 1000 UNITS CAPS Take 1,000 Units by mouth daily.   Yes [provider]  citalopram (CELEXA) 40 MG tablet Take 40 mg by mouth daily. 12/19/20  Yes [provider]  esomeprazole (NEXIUM) 40 MG capsule Take 40 mg by mouth daily.    Yes [provider]  hydrochlorothiazide (HYDRODIURIL) 12.5 MG tablet Take 12.5 mg by mouth daily. 12/19/20  Yes [provider]  loperamide (IMODIUM) 2 MG capsule Take by mouth as needed for diarrhea or loose stools. Takes 2 tablets as needed.  Yes [provider]  losartan (COZAAR) 50 MG tablet Take 50 mg by mouth every morning.   Yes [provider]  Multiple Vitamins-Minerals (CENTRUM SILVER ULTRA WOMENS PO) Take 1 tablet by mouth every morning.   Yes [provider]  Omega-3 Fatty Acids (FISH OIL) 1000 MG CAPS Take 2 capsules by mouth daily.   Yes [provider]  ondansetron (ZOFRAN ODT) 4 MG disintegrating tablet Take 1 tablet (4 mg total) by mouth every 8 (eight) hours as needed for nausea or vomiting. Patient taking differently: Take 4 mg by mouth as needed for nausea or vomiting. 01/26/21  Yes Cheron Schaumann K, PA-C  oxybutynin (DITROPAN) 5 MG tablet Take 5 mg by mouth daily.   Yes [provider]  Probiotic Product (PROBIOTIC DAILY PO) Take 1 capsule by mouth daily.   Yes [provider]  traMADol (ULTRAM) 50 MG tablet Take 50 mg by mouth every 6 (six) hours as needed. For pain   Yes [provider]  vitamin B-12 (CYANOCOBALAMIN) 1000 MCG tablet Take 1,000 mcg by mouth daily.   Yes [provider]  potassium chloride SA (KLOR-CON) 20 MEQ tablet Take 1 tablet (20 mEq total) by mouth 2 (two) times daily for 7 days. Patient not taking: Reported on 02/14/2021 01/20/21 01/27/21  Burgess Amor, PA-C    Allergies as of 02/14/2021 - Review Complete 02/14/2021  Allergen Reaction Noted   Codeine Hives and Swelling 12/27/2015    Family History  Problem Relation Age of Onset   Colon cancer Maternal Uncle    Colon cancer Maternal Grandmother     Social History   Socioeconomic History   Marital status: Widowed    Spouse name: Not on file   Number of children: Not on file   Years of education: Not on file   Highest education level: Not on file  Occupational History   Not on file  Tobacco Use   Smoking status: Never   Smokeless tobacco: Never  Vaping Use   Vaping Use: Never used  Substance and Sexual Activity   Alcohol use: No   Drug use: No   Sexual activity: Not on file  Other Topics Concern   Not on file  Social History Narrative   Not on file   Social Determinants of Health   Financial Resource Strain: Not on file  Food Insecurity: Not on file  Transportation Needs: Not on file  Physical Activity: Not on file  Stress: Not on file  Social Connections: Not on file  Intimate Partner Violence: Not on file    Review of Systems: See HPI, otherwise negative ROS  Physical Exam: BP (!) 157/69   Pulse 80   Temp 97.7 F (36.5 C) (Temporal)   Ht 5\' 3"  (1.6 m)   Wt 148 lb 3.2 oz (67.2 kg)   BMI 26.25 kg/m  General:   Alert,   pleasant and  cooperative in NAD Neck:  Supple; no masses or thyromegaly. No significant cervical adenopathy. Lungs:  Clear throughout to auscultation.   No wheezes, crackles, or rhonchi. No acute distress. Heart:  Regular rate and rhythm; no murmurs, clicks, rubs,  or gallops. Abdomen: Nondistended.  Positive bowel sounds.  She does have some tenderness to deep left lower quadrant palpation.  No appreciable mass organomegaly  pulses:  Normal pulses noted. Extremities:  Without clubbing or edema.  Impression/Plan: 80 year old lady with long history of microscopic colitis finally in remission earlier this year on a slow taper of  prednisone.  Clinical course punctuated by bout of uncomplicated diverticulitis.  She seems to be steadily improving.  Acute episode just about a month ago.   Recommendations:  Continue Nexium 40 mg daily  Information on a high-fiber low-fat diet  Patient is to call back later today with the Metamucil preparation she is taking  - listing the fiber grams in each caplet.  No need for further antibiotics or prednisone at this time  If symptoms deteriorate between now and her next office visit she is to call immediately  Plan for office visit in 3 months     Notice: This dictation was prepared with Dragon dictation along with smaller phrase technology. Any transcriptional errors that result from this process are unintentional and may not be corrected upon review.

## 2021-02-14 NOTE — Telephone Encounter (Signed)
Pt called back and is taking Fiber Choice and takes 2 grams per serving size.

## 2021-02-15 NOTE — Telephone Encounter (Signed)
Tried to call pt but no answer

## 2021-02-16 NOTE — Telephone Encounter (Signed)
Lmom for pt to call us back. 

## 2021-02-16 NOTE — Telephone Encounter (Signed)
Line busy. Will call back later.

## 2021-02-20 ENCOUNTER — Encounter: Payer: Self-pay | Admitting: *Deleted

## 2021-02-20 NOTE — Telephone Encounter (Signed)
Sent letter to pt with recommendations.

## 2021-02-23 ENCOUNTER — Telehealth: Payer: Self-pay | Admitting: Internal Medicine

## 2021-02-23 NOTE — Telephone Encounter (Signed)
Agree with advice provided. To slowly increase fiber supplement to 10g daily over next 4-6 weeks as per Rourk's recommendations. May use imodium 2mg  2 to 3 times daily as needed.   She should call if symptoms do not settle down.

## 2021-02-23 NOTE — Telephone Encounter (Signed)
Spoke with pt and asked her if she had received our letter that we mailed out with recommendations from Dr. Jena Gauss.  Pt stated that she had not received it yet.  Informed her that Dr. Jena Gauss suggested that she slowly increase her fiber to 10 g daily over the next month to 6 weeks as tolerated.  Informed pt that doing this should help but I would let our provider know the issue that she is having as well.  Diarrhea for one week.  3 to 4 bm's soft to watery a day.  Taking imodium to help.  Worse diarrhea at night.  Routing to Tana Coast, PA-C in Dr. Luvenia Starch absence. t

## 2021-02-23 NOTE — Telephone Encounter (Signed)
Pt asked to speak with nurse about her diarrhea. Please call [phone]

## 2021-02-23 NOTE — Telephone Encounter (Signed)
Pt made aware and was told to call us if symptoms do not settle down.  Pt voiced understanding.

## 2021-03-29 ENCOUNTER — Telehealth: Payer: Self-pay | Admitting: Internal Medicine

## 2021-03-29 ENCOUNTER — Other Ambulatory Visit: Payer: Self-pay | Admitting: Internal Medicine

## 2021-03-29 DIAGNOSIS — R197 Diarrhea, unspecified: Secondary | ICD-10-CM

## 2021-03-29 DIAGNOSIS — K5732 Diverticulitis of large intestine without perforation or abscess without bleeding: Secondary | ICD-10-CM

## 2021-03-29 NOTE — Telephone Encounter (Signed)
Patient wants to speak to dr. Jeanella Flattery nurse about her diarrhea

## 2021-03-29 NOTE — Telephone Encounter (Signed)
Pt called complaining of ongoing diarrhea. Pt states that since she was in the office last she has been taking imodium and fiber as directed, and that it helped some but has not stopped it. Pt has also been diagnosed with a UTI since Monday and is currently taking Cipro. Pt is also complaining of lower abd pain and states that she doesn't know if it is coming from the infection or her diverticulitis. Pt is not having any nausea, vomiting, fever or rectal bleeding.

## 2021-03-29 NOTE — Telephone Encounter (Signed)
Spoke with pt and informed her to go to lab to pick up stool kit. Pt verbalized understanding.

## 2021-04-01 LAB — C. DIFFICILE GDH AND TOXIN A/B
GDH ANTIGEN: NOT DETECTED
MICRO NUMBER:: 12298698
SPECIMEN QUALITY:: ADEQUATE
TOXIN A AND B: NOT DETECTED

## 2021-04-10 NOTE — Progress Notes (Deleted)
Referring Provider: [facility] Primary Care Physician:  [facility] Primary GI Physician: Dr. Jena Gauss  No chief complaint on file.   HPI:   Marie [name] is a 80 y.o. female with history of microscopic colitis, GERD, uncomplicated sigmoid diverticulitis June 2021 presenting today for follow-up.  Last seen in our office 02/14/2021.  She had recently been diagnosed with sigmoid diverticulitis via CT scan in the ED on June 16 s/p 7-day course of Cipro and Flagyl.  She reported steady improvement in her abdominal pain, but not quite resolved (3/10).  No fever, nausea, or vomiting.  Eating well.  GERD well controlled.  Regarding collagenous colitis, she had tapered prednisone down very slowly and was finally off of prednisone.  Her bowel function has been doing very well until acute diverticulitis episode.  Recommended continuing Nexium 40 mg daily, information provided on high-fiber diet (recommended slowly increasing to 10 supplemental grams of fiber daily), no further antibiotics or prednisone needed.  Requested patient to call if her symptoms deteriorate between now and her follow-up visit.  Follow-up in 3 months.  Patient called 03/29/2021 complaining of ongoing diarrhea despite Imodium and fiber as directed.  Also reported she is currently taking Cipro due to UTI.  Complaint of lower abdominal pain, unable to tell if this was coming from UTI versus diverticulitis.  Dr. Jena Gauss recommended checking stool for C. difficile due to recurrent antibiotic exposure.  C. difficile stool test was negative.  Recommended office visit for further evaluation of diarrhea.   Today:        Check GI pathogen panel, TSH, celiac screen If negative start imodium and prednisone.  *Entocort is cost prohibitive.  Previously treated with prednisone taper starting at 15 mg daily x1 month, 10 mg daily x1 month, 5 mg daily x1 month, then 5 mg every other day.  Past Medical  History:  Diagnosis Date   Anemia    Collagenous colitis    colonoscopy 2009   Depression    Fibromyalgia    GERD (gastroesophageal reflux disease)    Hiatal hernia 2008   small   Hypercholesterolemia    Hypertension    IBS (irritable bowel syndrome)    Lymphocytic colitis    colonoscopy 2007, TCS 2013   S/P endoscopy Sept 2008   small hh, s/p 2 French dilator    Past Surgical History:  Procedure Laterality Date   ABDOMINAL HYSTERECTOMY     BIOPSY  01/09/2017   Procedure: BIOPSY;  Surgeon: Corbin Ade, MD;  [facility]: [facility];  Service: Endoscopy;;  colon   CATARACT EXTRACTION Bilateral 2021   COLONOSCOPY  04/13/2008   Dr.Rourk- anal papilla and internal hemorrhage otherwise normal /left side diverticula   COLONOSCOPY  09/20/2011   Dr.Rourk- Adequate preparation. Internal hemorrhoids; otherwise normal/ Left-sided diverticula, biopsies positive for lymphocytic colitis   COLONOSCOPY N/A 01/09/2017   Procedure: COLONOSCOPY;  Surgeon: Corbin Ade, MD;  Location: [facility];  Service: Endoscopy;  Laterality: N/A;  7:30am   ESOPHAGOGASTRODUODENOSCOPY  04/22/2007   Dr.Rourk- normal esophagus, small hiatal hernia o/w normal stomach, D1 and D2 s/p passage of a 56-French maloney dilator   fistula of colon     FLEXIBLE SIGMOIDOSCOPY  05/29/2006   Dr.Rehman- No evidence of pseudomembranous or acute colitis.   FOOT SURGERY     KNEE SURGERY     NOSE SURGERY     VEIN SURGERY Bilateral 2021    Current Outpatient Medications  Medication Sig Dispense Refill   Ascorbic Acid (VITAMIN C PO) Take 1 tablet by mouth every morning.     CALCIUM PO Take 1 tablet by mouth every morning.     Cholecalciferol (VITAMIN D3) 1000 UNITS CAPS Take 1,000 Units by mouth daily.     citalopram (CELEXA) 40 MG tablet Take 40 mg by mouth daily.     esomeprazole (NEXIUM) 40 MG capsule Take 40 mg by mouth daily.      hydrochlorothiazide (HYDRODIURIL) 12.5 MG tablet Take 12.5 mg by mouth daily.      Inulin (FIBERCHOICE PO) Take by mouth. Takes Fiber Choice 2 grams.     loperamide (IMODIUM) 2 MG capsule Take by mouth as needed for diarrhea or loose stools. Takes 2 tablets as needed.     losartan (COZAAR) 50 MG tablet Take 50 mg by mouth every morning.     Multiple Vitamins-Minerals (CENTRUM SILVER ULTRA WOMENS PO) Take 1 tablet by mouth every morning.     Omega-3 Fatty Acids (FISH OIL) 1000 MG CAPS Take 2 capsules by mouth daily.     ondansetron (ZOFRAN ODT) 4 MG disintegrating tablet Take 1 tablet (4 mg total) by mouth every 8 (eight) hours as needed for nausea or vomiting. (Patient taking differently: Take 4 mg by mouth as needed for nausea or vomiting.) 20 tablet 0   oxybutynin (DITROPAN) 5 MG tablet Take 5 mg by mouth daily.     potassium chloride SA (KLOR-CON) 20 MEQ tablet Take 1 tablet (20 mEq total) by mouth 2 (two) times daily for 7 days. (Patient not taking: Reported on 02/14/2021) 14 tablet 0   Probiotic Product (PROBIOTIC DAILY PO) Take 1 capsule by mouth daily.     traMADol (ULTRAM) 50 MG tablet Take 50 mg by mouth every 6 (six) hours as needed. For pain     vitamin B-12 (CYANOCOBALAMIN) 1000 MCG tablet Take 1,000 mcg by mouth daily.     No current facility-administered medications for this visit.    Allergies as of 04/12/2021 - Review Complete 02/14/2021  Allergen Reaction Noted   Codeine Hives and Swelling 12/27/2015    Family History  Problem Relation Age of Onset   Colon cancer Maternal Uncle    Colon cancer Maternal Grandmother     Social History   Socioeconomic History   Marital status: Widowed    Spouse name: Not on file   Number of children: Not on file   Years of education: Not on file   Highest education level: Not on file  Occupational History   Not on file  Tobacco Use   Smoking status: Never   Smokeless tobacco: Never  Vaping Use   Vaping Use: Never used  Substance and Sexual Activity   Alcohol use: No   Drug use: No   Sexual activity: Not on  file  Other Topics Concern   Not on file  Social History Narrative   Not on file   Social Determinants of Health   Financial Resource Strain: Not on file  Food Insecurity: Not on file  Transportation Needs: Not on file  Physical Activity: Not on file  Stress: Not on file  Social Connections: Not on file    Review of Systems: Gen: Denies fever, chills, anorexia. Denies fatigue, weakness, weight loss.  CV: Denies chest pain, palpitations, syncope, peripheral edema, and claudication. Resp: Denies dyspnea at rest, cough, wheezing, coughing up blood, and pleurisy. GI: Denies vomiting blood, jaundice, and fecal incontinence.   Denies dysphagia or  odynophagia. Derm: Denies rash, itching, dry skin Psych: Denies depression, anxiety, memory loss, confusion. No homicidal or suicidal ideation.  Heme: Denies bruising, bleeding, and enlarged lymph nodes.  Physical Exam: There were no vitals taken for this visit. General:   Alert and oriented. No distress noted. Pleasant and cooperative.  Head:  Normocephalic and atraumatic. Eyes:  Conjuctiva clear without scleral icterus. Mouth:  Oral mucosa pink and moist. Good dentition. No lesions. Heart:  S1, S2 present without murmurs appreciated. Lungs:  Clear to auscultation bilaterally. No wheezes, rales, or rhonchi. No distress.  Abdomen:  +BS, soft, non-tender and non-distended. No rebound or guarding. No HSM or masses noted. Msk:  Symmetrical without gross deformities. Normal posture. Extremities:  Without edema. Neurologic:  Alert and  oriented x4 Psych:  Alert and cooperative. Normal mood and affect.

## 2021-04-12 ENCOUNTER — Ambulatory Visit: Payer: Medicare (Managed Care) | Admitting: Gastroenterology

## 2021-08-08 IMAGING — CT CT ABD-PELV W/ CM
2 of 5 series · 13 of 46 positions shown, 15 images · IV contrast (Omnipaque or Isovue)
Comparison: 07/13/2016
COMPARISON: 07/13/2016

Addendum:
CLINICAL DATA: Diarrhea.

EXAM:
CT ABDOMEN AND PELVIS WITH CONTRAST
TECHNIQUE: Multidetector CT imaging of the abdomen and pelvis was performed
using the standard protocol following bolus administration of
intravenous contrast.
CONTRAST:  100mL OMNIPAQUE IOHEXOL 300 MG/ML  SOLN

[Series 2: axial st · axial · 0.60mm/px · z∈[-572,-252]mm · 10 of 74 slices shown, 12 images]
[im 5/74  soft-tissue]
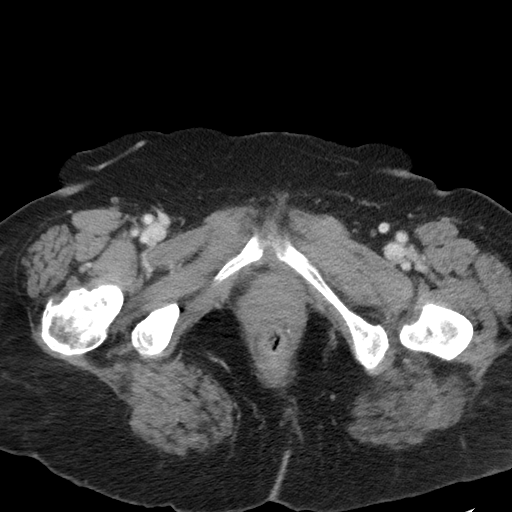
[im 5/74  bone]
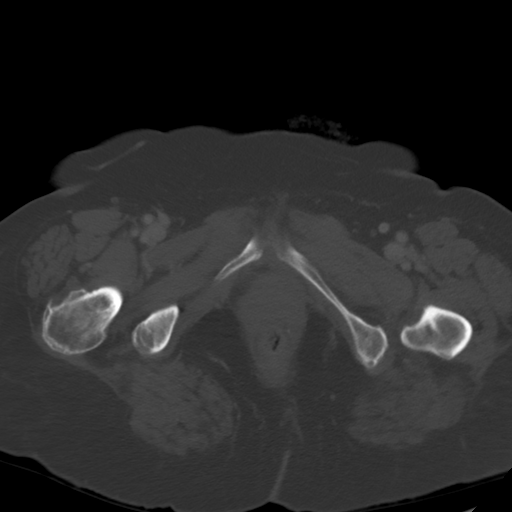
[im 13/74  soft-tissue]
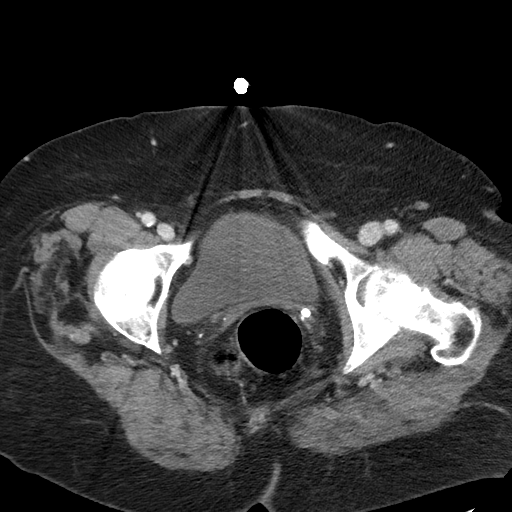
[im 22/74  soft-tissue]
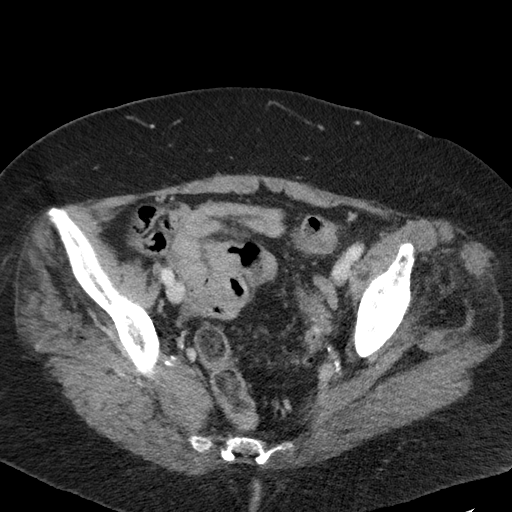
[im 26/74  soft-tissue]
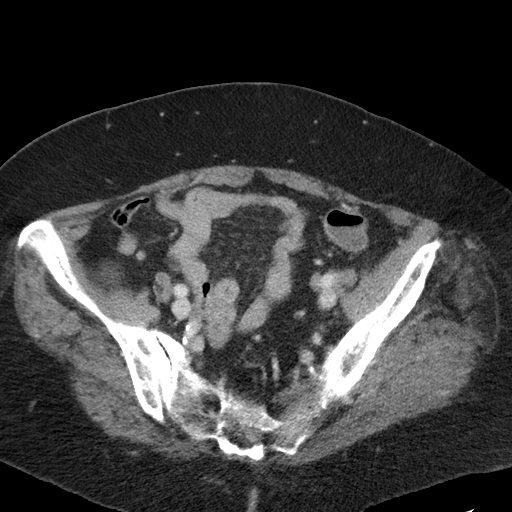
[im 35/74  soft-tissue]
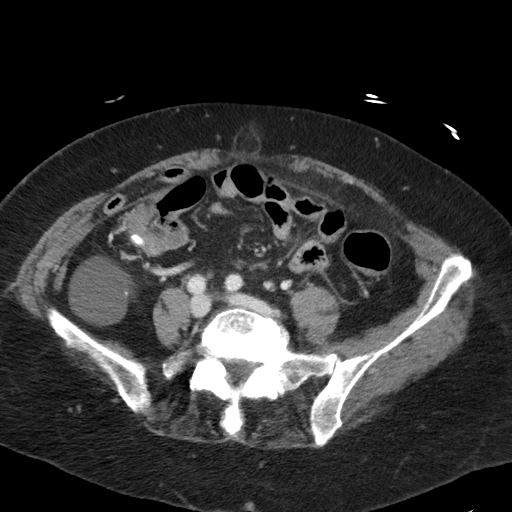
[im 39/74  soft-tissue]
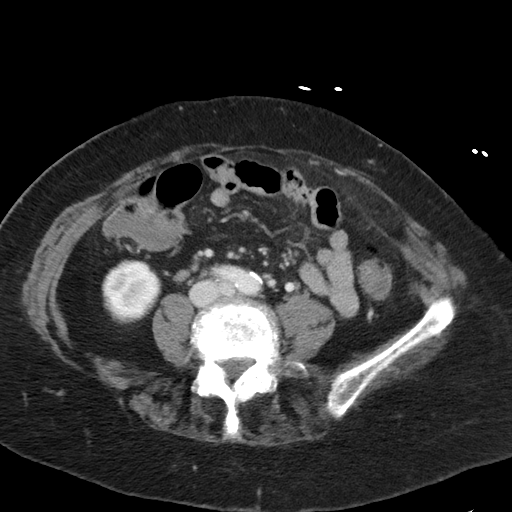
[im 48/74  soft-tissue]
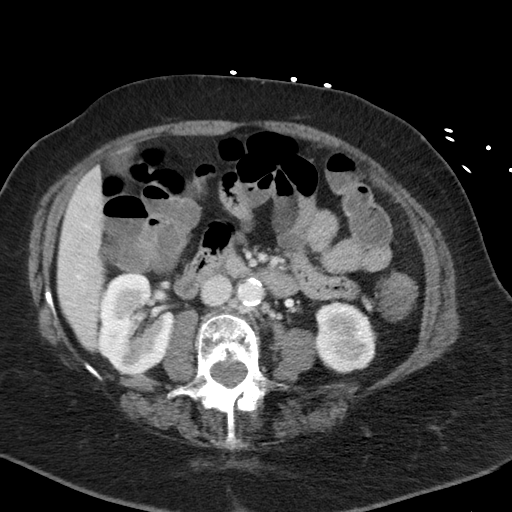
[im 56/74  soft-tissue]
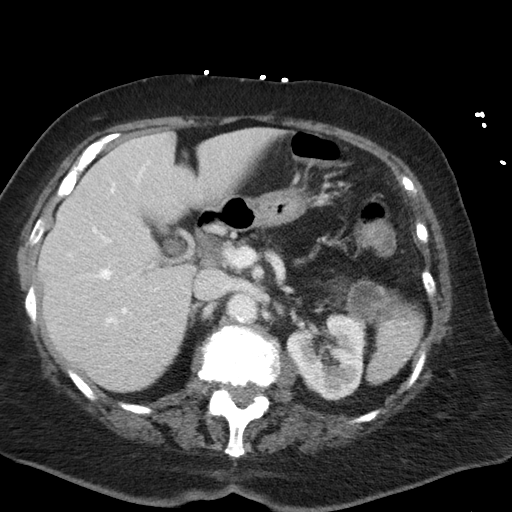
[im 61/74  soft-tissue]
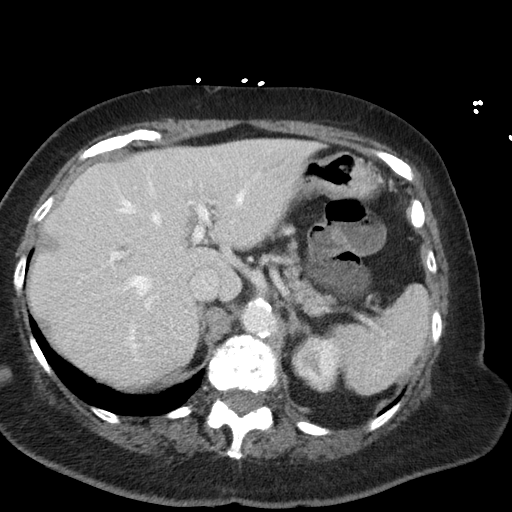
[im 61/74  bone]
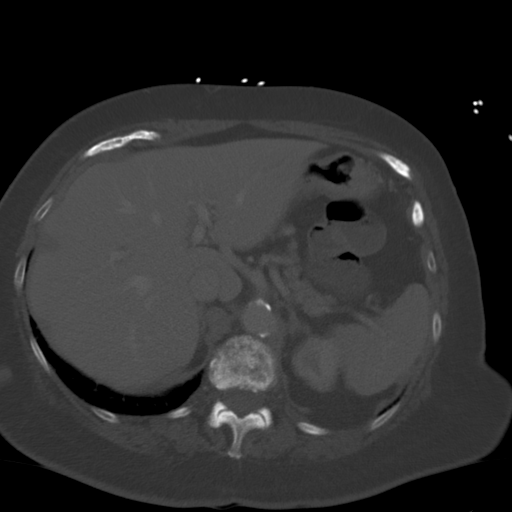
[im 69/74  soft-tissue]
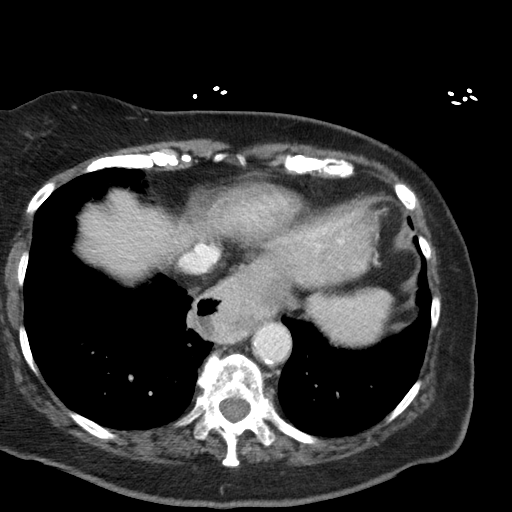

[Series 5: coronal st · coronal · 0.68mm/px · 3 of 73 slices shown]
[im 25/73  soft-tissue]
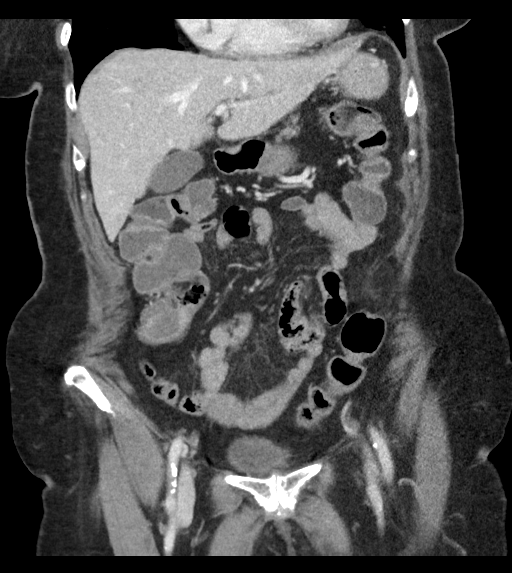
[im 33/73  soft-tissue]
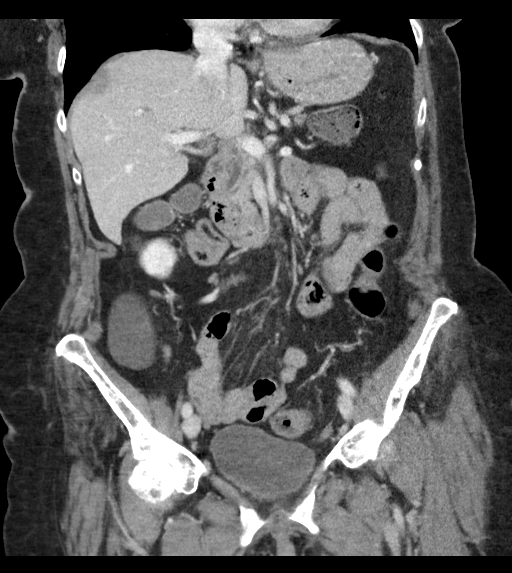
[im 41/73  soft-tissue]
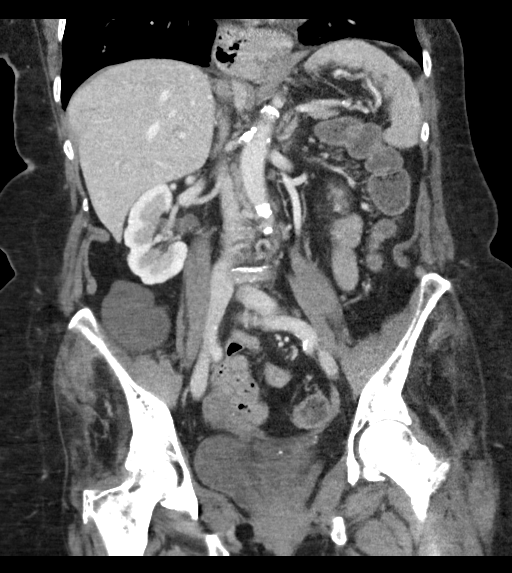

[13 of 46 positions shown; findings below may reference images not displayed]

FINDINGS: Lower chest: Small to moderate hiatal hernia.

Hepatobiliary: No suspicious focal abnormality within the liver
parenchyma. Small area of low attenuation in the anterior liver,
adjacent to the falciform ligament, is in a characteristic location
for focal fatty deposition. There is no evidence for gallstones,
gallbladder wall thickening, or pericholecystic fluid. No
intrahepatic or extrahepatic biliary dilation.

Pancreas: No focal mass lesion. No dilatation of the main duct. No
intraparenchymal cyst. No peripancreatic edema.

Spleen: No splenomegaly. No focal mass lesion.

Adrenals/Urinary Tract: No adrenal nodule or mass. Tiny
hypoattenuating lesions in both kidneys are too small to
characterize but likely benign. No evidence for hydroureter. The
urinary bladder appears normal for the degree of distention.

Stomach/Bowel: Small to moderate hiatal hernia is new since prior.
Duodenum is normally positioned as is the ligament of Treitz. No
small bowel wall thickening. No small bowel dilatation. The terminal
ileum is normal. The appendix is normal. Right and transverse colon
are fluid-filled without wall thickening. There is relatively
diffuse wall thickening in the sigmoid colon with left colonic
diverticulosis. Posterior diverticulum in the mid sigmoid segment is
ill-defined with adjacent edema/inflammation (axial 55/2 and well
demonstrated on coronal 56/5) consistent with diverticulitis. No
evidence for perforation or abscess.

Vascular/Lymphatic: There is abdominal aortic atherosclerosis
without aneurysm. There is no gastrohepatic or hepatoduodenal
ligament lymphadenopathy. No retroperitoneal or mesenteric
lymphadenopathy. No pelvic sidewall lymphadenopathy.

Reproductive: Uterus surgically absent.  There is no adnexal mass.

Other: No intraperitoneal free fluid.

Musculoskeletal: Small paraumbilical hernia contains only fat. No
worrisome lytic or sclerotic osseous abnormality.
IMPRESSION: 1. Diffuse wall thickening in the sigmoid colon with left colonic
diverticulosis. Posterior diverticulum in the mid sigmoid segment is
ill-defined with adjacent edema/inflammation consistent with
diverticulitis. No evidence for perforation or abscess.
2. Small to moderate hiatal hernia, new since [DATE]. Small paraumbilical hernia contains only fat.
4. Aortic Atherosclerosis (WAF4J-L2K.K).

ADDENDUM:
Well-defined homogeneous cystic lesion in the right lower quadrant
extraperitoneal space again noted but not described in the original
report. This has been present since 4227 and was evaluated by MRI in
0527. The lesion has been slowly progressive over that time and
measures 5.0 x 4.2 cm today compared to 5.1 x 3.7 cm on the 0527
exam. Imaging features remain compatible with a benign etiology.

*** End of Addendum ***
FINDINGS: Lower chest: Small to moderate hiatal hernia.

Hepatobiliary: No suspicious focal abnormality within the liver
parenchyma. Small area of low attenuation in the anterior liver,
adjacent to the falciform ligament, is in a characteristic location
for focal fatty deposition. There is no evidence for gallstones,
gallbladder wall thickening, or pericholecystic fluid. No
intrahepatic or extrahepatic biliary dilation.

Pancreas: No focal mass lesion. No dilatation of the main duct. No
intraparenchymal cyst. No peripancreatic edema.

Spleen: No splenomegaly. No focal mass lesion.

Adrenals/Urinary Tract: No adrenal nodule or mass. Tiny
hypoattenuating lesions in both kidneys are too small to
characterize but likely benign. No evidence for hydroureter. The
urinary bladder appears normal for the degree of distention.

Stomach/Bowel: Small to moderate hiatal hernia is new since prior.
Duodenum is normally positioned as is the ligament of Treitz. No
small bowel wall thickening. No small bowel dilatation. The terminal
ileum is normal. The appendix is normal. Right and transverse colon
are fluid-filled without wall thickening. There is relatively
diffuse wall thickening in the sigmoid colon with left colonic
diverticulosis. Posterior diverticulum in the mid sigmoid segment is
ill-defined with adjacent edema/inflammation (axial 55/2 and well
demonstrated on coronal 56/5) consistent with diverticulitis. No
evidence for perforation or abscess.

Vascular/Lymphatic: There is abdominal aortic atherosclerosis
without aneurysm. There is no gastrohepatic or hepatoduodenal
ligament lymphadenopathy. No retroperitoneal or mesenteric
lymphadenopathy. No pelvic sidewall lymphadenopathy.

Reproductive: Uterus surgically absent.  There is no adnexal mass.

Other: No intraperitoneal free fluid.

Musculoskeletal: Small paraumbilical hernia contains only fat. No
worrisome lytic or sclerotic osseous abnormality.
IMPRESSION: 1. Diffuse wall thickening in the sigmoid colon with left colonic
diverticulosis. Posterior diverticulum in the mid sigmoid segment is
ill-defined with adjacent edema/inflammation consistent with
diverticulitis. No evidence for perforation or abscess.
2. Small to moderate hiatal hernia, new since [DATE]. Small paraumbilical hernia contains only fat.
4. Aortic Atherosclerosis (WAF4J-L2K.K).

## 2021-11-27 NOTE — Progress Notes (Signed)
? ?GI Office Note   ? ?Referring Provider: [facility] ?Primary Care Physician:  [facility] ?Primary GI: Dr. Jena Gauss ? ?Date:  11/30/2021  ?ID:  Marie [name], DOB Dec 28, 1940, MRN [id number] ? ? ?Chief Complaint  ? ?Chief Complaint  ?Patient presents with  ? Diarrhea  ?  Diarrhea at night  and early morning. Very weak   ? ? ? ?History of Present Illness  ?Marie [name] is a 81 y.o. female presenting today with a history of lymphocytic colitis (diagnosed in 2007 and 2013), collagenous colitis (diagnosed in 2009), depression, fibromyalgia, anemia, GERD, HLD, HTN, and IBS presenting today with complaint of diarrhea. ? ?Last colonoscopy 01/09/2017 -diverticulosis noted in the sigmoid colon, segmental biopsies taken, exam otherwise normal.  Biopsies confirmed lymphocytic colitis, was advised Entocort (budesonide) and a referral to tertiary center. ? ?Appointment with [facility] digestive health services on 03/14/2017 -patient reported ongoing diarrhea (chronically ongoing for 5 years), failed prednisone course for lymphocytic colitis, was on Entocort which improved her symptoms for about 3 weeks before coming back, Pepto-Bismol and Imodium without much effect, and left lower quadrant abdominal pain that improves with passage of stool.  Of note she was also having fecal incontinence 2-3 times a week.  She was encouraged to increase her fiber intake with a goal of 30 to 40 g of fiber daily and was advised to slowly increase her fiber each week, take 2 tablets of Imodium 4 times daily, and continue budesonide.  Does not appear that she followed up with them after this. ? ?Last seen in our office 02/14/2021.  At this visit she had had been seen in the ER twice, once for chest pain thought to be a GERD flare, and the other for left lower quadrant abdominal pain with evaluation revealing uncomplicated sigmoid diverticulitis and had recently been treated with Cipro and Flagyl with pain that had not  quite resolved.  She had also recently been on a slow prednisone taper for history of microscopic colitis.  She was not treated with antibiotics or additional prednisone, encouraged to continue a high-fiber low-fat diet, continue Nexium 40 mg daily and follow-up in 3 months.  She later developed diarrhea (July/August) despite fiber and Imodium.  She was sent for stool testing for C. difficile which was negative.  She was advised to follow-up in clinic after her stool testing was negative but did not. ? ?Today:  ?Diarrhea ?Patient complains of diarrhea. Symptoms have been present for approximately 2 weeks. The symptoms are unchanged. Stool frequency is approximately several per day. patient is unable to estimate stool volume. Diarrhea does occur at night when she is getting up to pee due to bladder issues. The patient has noted no bleeding associated with bowel movements. The also patient reports the following symptoms: fecal incontinence, fecal urgency, nocturnal diarrhea, watery diarrhea, and feeling very weak . The patient denies the following symptoms: abdominal distension, diarrhea alternating with constipation, floating stools, greasy stool, red blood with stools, and stools difficult to flush. The patient currently denies significant abdominal pain or discomfort.   Relationship to food: the patient denies any relationship to food. Other risk factors: broad spectrum antibiotics, diverticulosis, history of microscopic colitis. Therapy tried so far: OTC antidiarrheal: imodium (4 tablets a day), results fair. Work up so far: none. ? ?Prior to today she was not having any issues. Started 2 weeks ago after having had a UTI, was given an antibiotic (possibly Cipro). Messed up 2 gowns  and 3 depends the last 2 nights. No warning, very runny like water [place] 7. Sometimes it just comes and does not give her much warning. Sometimes has a strong odor. Feels really weak, has been trying to drink and eat. Has a bladder  issue, gets up 4 times in the night to go urinate, had some stool every time she went. If she eats a lot she will go in the day time as well. Has been taking imodium (4 tablets) and extra fiber. Previously had been on prednisone for microscopic colitis. No belly pain.  ? ? ?Past Medical History:  ?Diagnosis Date  ? Anemia   ? Collagenous colitis   ? colonoscopy 2009  ? Depression   ? Fibromyalgia   ? GERD (gastroesophageal reflux disease)   ? Hiatal hernia 2008  ? small  ? Hypercholesterolemia   ? Hypertension   ? IBS (irritable bowel syndrome)   ? Lymphocytic colitis   ? colonoscopy 2007, TCS 2013  ? S/P endoscopy Sept 2008  ? small hh, s/p 54 French dilator  ? ? ?Past Surgical History:  ?Procedure Laterality Date  ? ABDOMINAL HYSTERECTOMY    ? BIOPSY  01/09/2017  ? Procedure: BIOPSY;  Surgeon: Corbin Ade, MD;  [facility];  Service: Endoscopy;;  colon  ? CATARACT EXTRACTION Bilateral 2021  ? COLONOSCOPY  04/13/2008  ? Dr.Rourk- anal papilla and internal hemorrhage otherwise normal /left side diverticula  ? COLONOSCOPY  09/20/2011  ? Dr.Rourk- Adequate preparation. Internal hemorrhoids; otherwise normal/ Left-sided diverticula, biopsies positive for lymphocytic colitis  ? COLONOSCOPY N/A 01/09/2017  ? Procedure: COLONOSCOPY;  Surgeon: Corbin Ade, MD;  [facility];  Service: Endoscopy;  Laterality: N/A;  7:30am  ? ESOPHAGOGASTRODUODENOSCOPY  04/22/2007  ? Dr.Rourk- normal esophagus, small hiatal hernia o/w normal stomach, D1 and D2 s/p passage of a 56-French maloney dilator  ? fistula of colon    ? FLEXIBLE SIGMOIDOSCOPY  05/29/2006  ? Dr.Rehman- No evidence of pseudomembranous or acute colitis.  ? FOOT SURGERY    ? KNEE SURGERY    ? NOSE SURGERY    ? VEIN SURGERY Bilateral 2021  ? ? ?Current Outpatient Medications  ?Medication Sig Dispense Refill  ? Ascorbic Acid (VITAMIN C PO) Take 1 tablet by mouth every morning.    ? CALCIUM PO Take 1 tablet by mouth every morning.    ? citalopram  (CELEXA) 40 MG tablet Take 40 mg by mouth daily.    ? esomeprazole (NEXIUM) 40 MG capsule Take 40 mg by mouth daily.     ? hydrochlorothiazide (HYDRODIURIL) 12.5 MG tablet Take 12.5 mg by mouth daily.    ? loperamide (IMODIUM) 2 MG capsule Take by mouth as needed for diarrhea or loose stools. Takes 2 tablets as needed.    ? losartan (COZAAR) 50 MG tablet Take 50 mg by mouth every morning.    ? Multiple Vitamins-Minerals (CENTRUM SILVER ULTRA WOMENS PO) Take 1 tablet by mouth every morning.    ? Omega-3 Fatty Acids (FISH OIL) 1000 MG CAPS Take 2 capsules by mouth daily.    ? Probiotic Product (PROBIOTIC DAILY PO) Take 1 capsule by mouth daily.    ? traMADol (ULTRAM) 50 MG tablet Take 50 mg by mouth every 6 (six) hours as needed. For pain    ? vitamin B-12 (CYANOCOBALAMIN) 1000 MCG tablet Take 1,000 mcg by mouth daily.    ? Cholecalciferol (VITAMIN D3) 1000 UNITS CAPS Take 1,000 Units by mouth daily. (Patient  not taking: Reported on 11/30/2021)    ? Inulin (FIBERCHOICE PO) Take by mouth. Takes Fiber Choice 2 grams. (Patient not taking: Reported on 11/30/2021)    ? ondansetron (ZOFRAN ODT) 4 MG disintegrating tablet Take 1 tablet (4 mg total) by mouth every 8 (eight) hours as needed for nausea or vomiting. (Patient not taking: Reported on 11/30/2021) 20 tablet 0  ? oxybutynin (DITROPAN) 5 MG tablet Take 5 mg by mouth daily. (Patient not taking: Reported on 11/30/2021)    ? potassium chloride SA (KLOR-CON) 20 MEQ tablet Take 1 tablet (20 mEq total) by mouth 2 (two) times daily for 7 days. (Patient not taking: Reported on 02/14/2021) 14 tablet 0  ? ?No current facility-administered medications for this visit.  ? ? ?Allergies as of 11/30/2021 - Review Complete 11/30/2021  ?Allergen Reaction Noted  ? Codeine Hives and Swelling 12/27/2015  ? ? ?Family History  ?Problem Relation Age of Onset  ? Colon cancer Maternal Uncle   ? Colon cancer Maternal Grandmother   ? ? ?Social History  ? ?Socioeconomic History  ? Marital status:  Widowed  ?  Spouse name: Not on file  ? Number of children: Not on file  ? Years of education: Not on file  ? Highest education level: Not on file  ?Occupational History  ? Not on file  ?Tobacco Use  ? Smoking st

## 2021-11-30 ENCOUNTER — Ambulatory Visit (INDEPENDENT_AMBULATORY_CARE_PROVIDER_SITE_OTHER): Payer: Medicare (Managed Care) | Admitting: Gastroenterology

## 2021-11-30 ENCOUNTER — Encounter: Payer: Self-pay | Admitting: Gastroenterology

## 2021-11-30 VITALS — BP 136/68 | HR 68 | Temp 97.6°F | Ht 63.0 in | Wt 142.8 lb

## 2021-11-30 DIAGNOSIS — R197 Diarrhea, unspecified: Secondary | ICD-10-CM

## 2021-11-30 NOTE — Patient Instructions (Addendum)
I am ordering labs and stool studies for you to have completed with [facility].  You may have this completed at your PCP office his lab, just make sure that the fax [place] the results.  I will be in touch with you after I receive the results.  If your stool does not show any signs of infection, I will speak with Dr. Jena Gauss regarding trial of prednisone to help with your diarrhea. ? ?You may continue to take up to 8 mg of Imodium a day.  Try to stay hydrated.  You may supplement with Pedialyte, Gatorade, Powerade. ?Avoid lactose or fatty/greasy foods. ? ? ?Boiled starches and cereals (eg, potatoes, noodles, rice, wheat, and oat) with salt are okay to eat as well as crackers, bananas, soup, and boiled vegetables may also be consumed.  ? ?If you become increasingly weak, dizzy, lightheaded please proceed to the ED for IV fluids. ? ?It was a pleasure to see you today. I want to create trusting relationships with patients. If you receive a survey regarding your visit,  I greatly appreciate you taking time to fill this out on paper or through your MyChart. I value your feedback. ? ?Brooke Bonito, MSN, FNP-BC, AGACNP-BC ?[facility] ? ? ?

## 2021-12-01 ENCOUNTER — Other Ambulatory Visit: Payer: Self-pay | Admitting: Gastroenterology

## 2021-12-01 LAB — CBC
HCT: 34.5 % — ABNORMAL LOW (ref 35.0–45.0)
Hemoglobin: 11.3 g/dL — ABNORMAL LOW (ref 11.7–15.5)
MCH: 29 pg (ref 27.0–33.0)
MCHC: 32.8 g/dL (ref 32.0–36.0)
MCV: 88.7 fL (ref 80.0–100.0)
MPV: 10.3 fL (ref 7.5–12.5)
Platelets: 245 10*3/uL (ref 140–400)
RBC: 3.89 10*6/uL (ref 3.80–5.10)
RDW: 12 % (ref 11.0–15.0)
WBC: 5.6 10*3/uL (ref 3.8–10.8)

## 2021-12-01 LAB — BASIC METABOLIC PANEL
BUN/Creatinine Ratio: 17 (calc) (ref 6–22)
BUN: 20 mg/dL (ref 7–25)
CO2: 25 mmol/L (ref 20–32)
Calcium: 9.4 mg/dL (ref 8.6–10.4)
Chloride: 104 mmol/L (ref 98–110)
Creat: 1.18 mg/dL — ABNORMAL HIGH (ref 0.60–1.00)
Glucose, Bld: 176 mg/dL — ABNORMAL HIGH (ref 65–99)
Potassium: 3.8 mmol/L (ref 3.5–5.3)
Sodium: 139 mmol/L (ref 135–146)

## 2021-12-04 ENCOUNTER — Encounter (HOSPITAL_COMMUNITY): Payer: Self-pay | Admitting: *Deleted

## 2021-12-04 ENCOUNTER — Telehealth: Payer: Self-pay

## 2021-12-04 ENCOUNTER — Emergency Department (HOSPITAL_COMMUNITY): Payer: Medicare (Managed Care)

## 2021-12-04 ENCOUNTER — Other Ambulatory Visit: Payer: Self-pay

## 2021-12-04 ENCOUNTER — Emergency Department (HOSPITAL_COMMUNITY)
Admission: EM | Admit: 2021-12-04 | Discharge: 2021-12-04 | Disposition: A | Discharge status: Home / Self Care | Payer: Medicare (Managed Care) | Attending: Emergency Medicine | Admitting: Emergency Medicine

## 2021-12-04 DIAGNOSIS — R1032 Left lower quadrant pain: Secondary | ICD-10-CM | POA: Insufficient documentation

## 2021-12-04 DIAGNOSIS — R197 Diarrhea, unspecified: Secondary | ICD-10-CM | POA: Diagnosis not present

## 2021-12-04 DIAGNOSIS — A09 Infectious gastroenteritis and colitis, unspecified: Secondary | ICD-10-CM

## 2021-12-04 DIAGNOSIS — R531 Weakness: Secondary | ICD-10-CM | POA: Diagnosis not present

## 2021-12-04 DIAGNOSIS — I10 Essential (primary) hypertension: Secondary | ICD-10-CM | POA: Insufficient documentation

## 2021-12-04 DIAGNOSIS — Z79899 Other long term (current) drug therapy: Secondary | ICD-10-CM | POA: Insufficient documentation

## 2021-12-04 LAB — LIPASE, BLOOD: Lipase: 23 U/L (ref 11–51)

## 2021-12-04 LAB — COMPREHENSIVE METABOLIC PANEL
ALT: 15 U/L (ref 0–44)
AST: 23 U/L (ref 15–41)
Albumin: 3.7 g/dL (ref 3.5–5.0)
Alkaline Phosphatase: 79 U/L (ref 38–126)
Anion gap: 7 (ref 5–15)
BUN: 20 mg/dL (ref 8–23)
CO2: 26 mmol/L (ref 22–32)
Calcium: 9.4 mg/dL (ref 8.9–10.3)
Chloride: 105 mmol/L (ref 98–111)
Creatinine, Ser: 0.77 mg/dL (ref 0.44–1.00)
GFR, Estimated: >60 mL/min (ref >60)
Glucose, Bld: 94 mg/dL (ref 70–99)
Potassium: 3.7 mmol/L (ref 3.5–5.1)
Sodium: 138 mmol/L (ref 135–145)
Total Bilirubin: 0.7 mg/dL (ref 0.3–1.2)
Total Protein: 7.1 g/dL (ref 6.5–8.1)

## 2021-12-04 LAB — URINALYSIS, ROUTINE W REFLEX MICROSCOPIC
Bilirubin Urine: NEGATIVE
Glucose, UA: NEGATIVE mg/dL
Hgb urine dipstick: NEGATIVE
Ketones, ur: 5 mg/dL — AB
Leukocytes,Ua: NEGATIVE
Nitrite: NEGATIVE
Protein, ur: NEGATIVE mg/dL
Specific Gravity, Urine: 1.02 (ref 1.005–1.030)
pH: 5 (ref 5.0–8.0)

## 2021-12-04 LAB — GASTROINTESTINAL PATHOGEN PNL
CampyloBacter Group: NOT DETECTED
Norovirus GI/GII: NOT DETECTED
Rotavirus A: NOT DETECTED
Salmonella species: NOT DETECTED
Shiga Toxin 1: NOT DETECTED
Shiga Toxin 2: NOT DETECTED
Shigella Species: NOT DETECTED
Vibrio Group: NOT DETECTED
Yersinia enterocolitica: DETECTED — AB

## 2021-12-04 LAB — CBC
HCT: 33.7 % — ABNORMAL LOW (ref 36.0–46.0)
Hemoglobin: 11.4 g/dL — ABNORMAL LOW (ref 12.0–15.0)
MCH: 29.8 pg (ref 26.0–34.0)
MCHC: 33.8 g/dL (ref 30.0–36.0)
MCV: 88 fL (ref 80.0–100.0)
Platelets: 216 10*3/uL (ref 150–400)
RBC: 3.83 MIL/uL — ABNORMAL LOW (ref 3.87–5.11)
RDW: 12.6 % (ref 11.5–15.5)
WBC: 6.2 10*3/uL (ref 4.0–10.5)
nRBC: 0 % (ref 0.0–0.2)

## 2021-12-04 LAB — ISOLATE REFERRAL PH
MICRO NUMBER:: 13332228
SPECIMEN QUALITY:: ADEQUATE

## 2021-12-04 MED ORDER — IOHEXOL 300 MG/ML  SOLN
100.0000 mL | Freq: Once | INTRAMUSCULAR | Status: AC | PRN
  Administered 2021-12-04: 100 mL via INTRAVENOUS

## 2021-12-04 MED ORDER — CIPROFLOXACIN IN D5W 400 MG/200ML IV SOLN
400.0000 mg | Freq: Once | INTRAVENOUS | Status: AC
  Administered 2021-12-04: 400 mg via INTRAVENOUS
  Filled 2021-12-04: qty 200

## 2021-12-04 MED ORDER — SODIUM CHLORIDE 0.9 % IV BOLUS
1000.0000 mL | Freq: Once | INTRAVENOUS | Status: AC
Start: 2021-12-04 — End: 2021-12-04
  Administered 2021-12-04: 1000 mL via INTRAVENOUS

## 2021-12-04 MED ORDER — CIPROFLOXACIN HCL 500 MG PO TABS: 500.0000 mg | ORAL_TABLET | Freq: Two times a day (BID) | ORAL | 0 refills | Status: DC

## 2021-12-04 NOTE — Discharge Instructions (Signed)
Your labs are reassuring today.  You are being placed on an antibiotic given your Yersinia diarrhea culture results.  Take your next dose of the antibiotic tomorrow morning.  Make sure you are drinking plenty of fluids to maintain hydration.  Follow-up with Dr. Gala Romney if you have persistent symptoms that are not improving with this treatment plan.  Return here if you think your symptoms are getting worse including diarrhea or weakness. ?

## 2021-12-04 NOTE — Telephone Encounter (Signed)
Noted  

## 2021-12-04 NOTE — ED Provider Notes (Signed)
?[facility] ?Provider Note ? ? ?CSN: [id number] ?Arrival date & time: 12/04/21  1320 ? ?  ? ?History ? ?Chief Complaint  ?Patient presents with  ? Diarrhea  ? ? ?Marie [name] is a 81 y.o. female with a history including hypertension, GERD, IBS with history of lymphocytic colitis who to the care of Dr. Gala Romney, also history of recurrent diarrhea with these episodes presenting for evaluation of a nearly 3-week history of nonbloody diarrhea.  She describes having liquid stools, waking her at night 3-4 times and during the daytime stools are generally triggered by milk consumption.  She denies nausea or vomiting or fevers and chills.  She denies dysuria or hematuria.  She was seen in Dr. Roseanne Kaufman office at which time they completed blood work and stool samples, she was called today to state her stool sample was positive for Yersinia and advised to come here if she was having increasing weakness which she concurs.  She has however been able to tolerate p.o. intake.  She describes having some left lower abdominal discomfort which is improving since she has been on Imodium for the past several days. ? ?The history is provided by the patient.  ? ?  ? ?Home Medications ?Prior to Admission medications   ?Medication Sig Start Date End Date Taking? Authorizing Provider  ?Ascorbic Acid (VITAMIN C PO) Take 1 tablet by mouth every morning.   Yes [provider]  ?CALCIUM PO Take 1 tablet by mouth every morning.   Yes [provider]  ?ciprofloxacin (CIPRO) 500 MG tablet Take 1 tablet (500 mg total) by mouth every 12 (twelve) hours. 12/04/21  Yes Ruther Ephraim, Almyra Free, PA-C  ?citalopram (CELEXA) 40 MG tablet Take 40 mg by mouth daily. 12/19/20  Yes [provider]  ?esomeprazole (NEXIUM) 40 MG capsule Take 40 mg by mouth daily.    Yes [provider]  ?hydrochlorothiazide (HYDRODIURIL) 12.5 MG tablet Take 12.5 mg by mouth daily. 12/19/20  Yes [provider]  ?loperamide (IMODIUM) 2  MG capsule Take by mouth as needed for diarrhea or loose stools. Takes 2 tablets as needed.   Yes [provider]  ?losartan (COZAAR) 50 MG tablet Take 50 mg by mouth every morning.   Yes [provider]  ?Multiple Vitamins-Minerals (CENTRUM SILVER ULTRA WOMENS PO) Take 1 tablet by mouth every morning.   Yes [provider]  ?Omega-3 Fatty Acids (FISH OIL) 1000 MG CAPS Take 2 capsules by mouth daily.   Yes [provider]  ?Probiotic Product (PROBIOTIC DAILY PO) Take 1 capsule by mouth daily.   Yes [provider]  ?traMADol (ULTRAM) 50 MG tablet Take 50 mg by mouth every 6 (six) hours as needed. For pain   Yes [provider]  ?triamcinolone cream (KENALOG) 0.5 % 1 application 0000000  Yes [provider]  ?vitamin B-12 (CYANOCOBALAMIN) 1000 MCG tablet Take 1,000 mcg by mouth daily.   Yes [provider]  ?ondansetron (ZOFRAN ODT) 4 MG disintegrating tablet Take 1 tablet (4 mg total) by mouth every 8 (eight) hours as needed for nausea or vomiting. ?Patient not taking: Reported on 11/30/2021 01/26/21   Fransico Meadow, PA-C  ?potassium chloride SA (KLOR-CON) 20 MEQ tablet Take 1 tablet (20 mEq total) by mouth 2 (two) times daily for 7 days. ?Patient not taking: Reported on 02/14/2021 01/20/21 01/27/21  Evalee Jefferson, PA-C  ?   ? ?Allergies    ?Codeine   ? ?Review of Systems   ?Review  of Systems  ?Constitutional:  Negative for chills and fever.  ?HENT:  Negative for congestion and sore throat.   ?Eyes: Negative.   ?Respiratory:  Negative for chest tightness and shortness of breath.   ?Cardiovascular:  Negative for chest pain.  ?Gastrointestinal:  Positive for abdominal pain and diarrhea. Negative for nausea.  ?Genitourinary: Negative.   ?Musculoskeletal:  Negative for arthralgias, joint swelling and neck pain.  ?Skin: Negative.  Negative for rash and wound.  ?Neurological:  Negative for dizziness, weakness, light-headedness, numbness and headaches.   ?Psychiatric/Behavioral: Negative.    ? ?Physical Exam ?Updated Vital Signs ?BP (!) 142/67   Pulse 66   Temp 97.6 ?F (36.4 ?C) (Oral)   Resp 17   Ht 5\' 3"  (1.6 m)   Wt 64.7 kg   SpO2 100%   BMI 25.27 kg/m?  ?Physical Exam ?Vitals and nursing note reviewed.  ?Constitutional:   ?   Appearance: She is well-developed.  ?HENT:  ?   Head: Normocephalic and atraumatic.  ?Eyes:  ?   Conjunctiva/sclera: Conjunctivae normal.  ?Cardiovascular:  ?   Rate and Rhythm: Normal rate and regular rhythm.  ?   Heart sounds: Normal heart sounds.  ?Pulmonary:  ?   Effort: Pulmonary effort is normal.  ?   Breath sounds: Normal breath sounds. No wheezing.  ?Abdominal:  ?   General: Bowel sounds are normal.  ?   Palpations: Abdomen is soft.  ?   Tenderness: There is no abdominal tenderness.  ?Musculoskeletal:     ?   General: Normal range of motion.  ?   Cervical back: Normal range of motion.  ?Skin: ?   General: Skin is warm and dry.  ?Neurological:  ?   Mental Status: She is alert.  ? ? ?ED Results / Procedures / Treatments   ?Labs ?(all labs ordered are listed, but only abnormal results are displayed) ?Labs Reviewed  ?CBC - Abnormal; Notable for the following components:  ?    Result Value  ? RBC 3.83 (*)   ? Hemoglobin 11.4 (*)   ? HCT 33.7 (*)   ? All other components within normal limits  ?URINALYSIS, ROUTINE W REFLEX MICROSCOPIC - Abnormal; Notable for the following components:  ? Ketones, ur 5 (*)   ? All other components within normal limits  ?LIPASE, BLOOD  ?COMPREHENSIVE METABOLIC PANEL  ? ? ?EKG ?None ? ?Radiology ?CT ABDOMEN PELVIS W CONTRAST ? ?Result Date: 12/04/2021 ?CLINICAL DATA:  Left-sided abdominal pain, diarrhea for 2 weeks EXAM: CT ABDOMEN AND PELVIS WITH CONTRAST TECHNIQUE: Multidetector CT imaging of the abdomen and pelvis was performed using the standard protocol following bolus administration of intravenous contrast. RADIATION DOSE REDUCTION: This exam was performed according to the departmental  dose-optimization program which includes automated exposure control, adjustment of the mA and/or kV according to patient size and/or use of iterative reconstruction technique. CONTRAST:  114mL OMNIPAQUE IOHEXOL 300 MG/ML  SOLN COMPARISON:  01/19/2021 FINDINGS: Lower chest: No acute pleural or parenchymal lung disease. Moderate hiatal hernia again noted. Hepatobiliary: No focal liver abnormality is seen. No gallstones, gallbladder wall thickening, or biliary dilatation. Pancreas: Unremarkable. No pancreatic ductal dilatation or surrounding inflammatory changes. Spleen: Normal in size without focal abnormality. Adrenals/Urinary Tract: Stable appearance of the bilateral kidneys. No urinary tract calculi or obstruction. The bladder is decompressed, limiting its evaluation. The adrenals are unremarkable. Stomach/Bowel: No bowel obstruction or ileus. Normal appendix right lower quadrant. No bowel wall thickening or inflammatory change. Colonic gas fluid levels consistent with  diarrhea. Vascular/Lymphatic: Aortic atherosclerosis. No enlarged abdominal or pelvic lymph nodes. Reproductive: Status post hysterectomy. No adnexal masses. Other: No free fluid or free gas. No abdominal wall hernia. Stable benign-appearing cystic structure within the right lower quadrant measuring 5.3 x 4.2 cm. Musculoskeletal: No acute or destructive bony lesions. Reconstructed images demonstrate no additional findings. IMPRESSION: 1. Diffuse colonic gas fluid levels consistent with clinical history of diarrhea. No bowel obstruction or ileus. 2. Moderate hiatal hernia. 3. Stable right lower quadrant cystic structure, consistent with benign entity given long-term stability. 4.  Aortic Atherosclerosis (ICD10-I70.0). Electronically Signed   By: Randa Ngo M.D.   On: 12/04/2021 17:36   ? ?Procedures ?Procedures  ? ? ?Medications Ordered in ED ?Medications  ?iohexol (OMNIPAQUE) 300 MG/ML solution 100 mL (100 mLs Intravenous Contrast Given 12/04/21  1716)  ?sodium chloride 0.9 % bolus 1,000 mL (0 mLs Intravenous Stopped 12/04/21 1958)  ?ciprofloxacin (CIPRO) IVPB 400 mg (0 mg Intravenous Stopped 12/04/21 1958)  ? ? ?ED Course/ Medical Decision Making/ A&P ?  ?

## 2021-12-04 NOTE — ED Triage Notes (Signed)
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

## 2021-12-04 NOTE — Telephone Encounter (Signed)
Mayer Camel from [facility] phoned with urgent lab results for Isolate referral from 12/01/2021. Pt currently at ED ?

## 2021-12-08 ENCOUNTER — Telehealth: Payer: Self-pay | Admitting: *Deleted

## 2021-12-08 NOTE — Telephone Encounter (Signed)
Spoke to pt, informed her to continue taking antibiotics and imodium. Informed her to go to ED if she feels weak or dizzy. Informed to call office when she is finished taking antibiotics and let us know how she is doing.  ?

## 2021-12-08 NOTE — Telephone Encounter (Signed)
Pt called and states she is still having really bad diarrhea. She went to hospital last week and was giving IV fluids and antibiotics. Pt states she is not feeling any better. Would like for you to send in Prednisone.   ?

## 2021-12-29 ENCOUNTER — Telehealth: Payer: Self-pay | Admitting: *Deleted

## 2021-12-29 DIAGNOSIS — R197 Diarrhea, unspecified: Secondary | ICD-10-CM

## 2021-12-29 NOTE — Telephone Encounter (Signed)
Spoke to pt, she informed me that her diarrhea is getting worse. She went to ED a couple weeks ago and was given a antibiotics. She states she was feeling better. But, a couple days ago it started back. States she has been taking Imodium and it is not helping.

## 2022-01-02 ENCOUNTER — Other Ambulatory Visit: Payer: Self-pay | Admitting: *Deleted

## 2022-01-02 DIAGNOSIS — R197 Diarrhea, unspecified: Secondary | ICD-10-CM

## 2022-01-02 NOTE — Telephone Encounter (Signed)
Spoke to pt, she informed me that she is still having diarrhea. She is having about 4 to 5 watery stool a day. She states she has been watching what she eats. No red meat, lactose or sugar. She has been eating a bland diet. Would like to know if some medication could be sent to the pharmacy for her.

## 2022-01-03 ENCOUNTER — Other Ambulatory Visit: Payer: Self-pay

## 2022-01-03 ENCOUNTER — Telehealth: Payer: Self-pay | Admitting: Gastroenterology

## 2022-01-03 DIAGNOSIS — R197 Diarrhea, unspecified: Secondary | ICD-10-CM

## 2022-01-03 DIAGNOSIS — K52831 Collagenous colitis: Secondary | ICD-10-CM

## 2022-01-03 DIAGNOSIS — K52832 Lymphocytic colitis: Secondary | ICD-10-CM

## 2022-01-03 NOTE — Telephone Encounter (Signed)
Patient called stating that she is having throat pain, feeling fatigued, and that food is going right through her.  Having multiple loose stools a day.  As stated in previous telephone note, need patient to complete repeat stool studies after prior positive stool study and treatment with Cipro.  Given complaints of muscle weakness, will also check electrolytes.  Orders placed for [facility] for patient to have completed at local PCP office/lab.  Patient was advised to follow bland diet, and seek care in the ED if symptoms worsen.  She was previously advised to continue Imodium and bland diet until stool samples completed.  Venetia Night, MSN, FNP-BC, AGACNP-BC [facility]

## 2022-01-03 NOTE — Addendum Note (Signed)
Addended by: Aida Raider on: 01/03/2022 09:24 AM   Modules accepted: Orders

## 2022-01-03 NOTE — Telephone Encounter (Signed)
Noted  

## 2022-01-03 NOTE — Telephone Encounter (Signed)
Spoke to pt, informed her of recommendations. Pt voiced understanding. Pt stated she is weak, her throat is hurting and her legs. I advised her to go to ED. She stated she did not want to go to ED. Suggested she go see her PCP. She doesn't want to do that either. Advised her to keep hydrated and try to eat a bland diet.

## 2022-01-06 LAB — C. DIFFICILE GDH AND TOXIN A/B
GDH ANTIGEN: NOT DETECTED
MICRO NUMBER:: 13472406
SPECIMEN QUALITY:: ADEQUATE
TOXIN A AND B: NOT DETECTED

## 2022-01-06 LAB — GIARDIA ANTIGEN
MICRO NUMBER:: 13472439
RESULT:: NOT DETECTED
SPECIMEN QUALITY:: ADEQUATE

## 2022-01-06 LAB — GASTROINTESTINAL PATHOGEN PNL
CampyloBacter Group: NOT DETECTED
Norovirus GI/GII: NOT DETECTED
Rotavirus A: NOT DETECTED
Salmonella species: NOT DETECTED
Shiga Toxin 1: NOT DETECTED
Shiga Toxin 2: NOT DETECTED
Shigella Species: NOT DETECTED
Vibrio Group: NOT DETECTED
Yersinia enterocolitica: NOT DETECTED

## 2022-01-08 MED ORDER — BUDESONIDE 3 MG PO CPEP: ORAL_CAPSULE | ORAL | 1 refills | Status: DC

## 2022-01-11 ENCOUNTER — Telehealth: Payer: Self-pay

## 2022-01-11 NOTE — Telephone Encounter (Signed)
PA for Budesonide Capsule DR 3 mg was approved from 01/10/22 until further notice. Approval letter to be scanned into patient's chart.

## 2022-02-07 ENCOUNTER — Encounter (HOSPITAL_COMMUNITY): Payer: Self-pay

## 2022-02-07 ENCOUNTER — Other Ambulatory Visit: Payer: Self-pay

## 2022-02-07 ENCOUNTER — Emergency Department (HOSPITAL_COMMUNITY): Payer: Medicare (Managed Care)

## 2022-02-07 ENCOUNTER — Emergency Department (HOSPITAL_COMMUNITY)
Admission: EM | Admit: 2022-02-07 | Discharge: 2022-02-08 | Disposition: A | Discharge status: Home / Self Care | Payer: Medicare (Managed Care) | Attending: Emergency Medicine | Admitting: Emergency Medicine

## 2022-02-07 DIAGNOSIS — W19XXXA Unspecified fall, initial encounter: Secondary | ICD-10-CM | POA: Insufficient documentation

## 2022-02-07 DIAGNOSIS — I10 Essential (primary) hypertension: Secondary | ICD-10-CM | POA: Diagnosis not present

## 2022-02-07 DIAGNOSIS — Z79899 Other long term (current) drug therapy: Secondary | ICD-10-CM | POA: Diagnosis not present

## 2022-02-07 DIAGNOSIS — S0083XA Contusion of other part of head, initial encounter: Secondary | ICD-10-CM | POA: Diagnosis not present

## 2022-02-07 DIAGNOSIS — S0993XA Unspecified injury of face, initial encounter: Secondary | ICD-10-CM | POA: Diagnosis present

## 2022-02-07 NOTE — ED Triage Notes (Signed)
Pov from home. Cc of fall. States she was trying to sit down on a desk chair when she missed the chair. Has small hematoma to right brow and states she has a slight pain to her left neck. Not on blood thinners

## 2022-02-07 NOTE — ED Provider Notes (Signed)
[facility] EMERGENCY DEPARTMENT Provider Note   CSN: [id number] Arrival date & time: 02/07/22  2108     History  Chief Complaint  Patient presents with   Marletta Lor    Marie [name] is a 81 y.o. female.   Fall   Patient has a history of GERD, hypertension, hypercholesterolemia, fibromyalgia, arthritis, IBS who presents to the ED for evaluation after fall.  Patient states normally she uses a walker.  She was attempting to sit down when the chair slid and she ended up falling striking her head.  Patient sustained a bruise to the right side of her head.  She has a headache and some left-sided neck discomfort.  She is not having any weakness.  No pain in her extremities.  No back pain.  No chest pain or abdominal pain.    Home Medications Prior to Admission medications   Medication Sig Start Date End Date Taking? Authorizing Provider  Ascorbic Acid (VITAMIN C PO) Take 1 tablet by mouth every morning.    [provider]  budesonide (ENTOCORT EC) 3 MG 24 hr capsule Take 2 capsules (6 mg) by mouth daily for 6 weeks then reduce to 1 capsule (3 mg) daily. 01/08/22   Aida Raider, NP  CALCIUM PO Take 1 tablet by mouth every morning.    [provider]  ciprofloxacin (CIPRO) 500 MG tablet Take 1 tablet (500 mg total) by mouth every 12 (twelve) hours. 12/04/21   Burgess Amor, PA-C  citalopram (CELEXA) 40 MG tablet Take 40 mg by mouth daily. 12/19/20   [provider]  esomeprazole (NEXIUM) 40 MG capsule Take 40 mg by mouth daily.     [provider]  hydrochlorothiazide (HYDRODIURIL) 12.5 MG tablet Take 12.5 mg by mouth daily. 12/19/20   [provider]  loperamide (IMODIUM) 2 MG capsule Take by mouth as needed for diarrhea or loose stools. Takes 2 tablets as needed.    [provider]  losartan (COZAAR) 50 MG tablet Take 50 mg by mouth every morning.    [provider]  Multiple Vitamins-Minerals (CENTRUM SILVER ULTRA WOMENS PO) Take 1  tablet by mouth every morning.    [provider]  Omega-3 Fatty Acids (FISH OIL) 1000 MG CAPS Take 2 capsules by mouth daily.    [provider]  ondansetron (ZOFRAN ODT) 4 MG disintegrating tablet Take 1 tablet (4 mg total) by mouth every 8 (eight) hours as needed for nausea or vomiting. Patient not taking: Reported on 11/30/2021 01/26/21   Elson Areas, PA-C  potassium chloride SA (KLOR-CON) 20 MEQ tablet Take 1 tablet (20 mEq total) by mouth 2 (two) times daily for 7 days. Patient not taking: Reported on 02/14/2021 01/20/21 01/27/21  Burgess Amor, PA-C  Probiotic Product (PROBIOTIC DAILY PO) Take 1 capsule by mouth daily.    [provider]  traMADol (ULTRAM) 50 MG tablet Take 50 mg by mouth every 6 (six) hours as needed. For pain    [provider]  triamcinolone cream (KENALOG) 0.5 % 1 application 12/15/20   [provider]  vitamin B-12 (CYANOCOBALAMIN) 1000 MCG tablet Take 1,000 mcg by mouth daily.    [provider]      Allergies    Codeine    Review of Systems   Review of Systems  Physical Exam Updated Vital Signs BP (!) 158/67   Pulse 74   Temp (!) 97.5 F (36.4 C) (Oral)   Resp 17   Ht 1.6  m (5\' 3" )   Wt 65 kg   SpO2 92%   BMI 25.38 kg/m  Physical Exam Vitals and nursing note reviewed.  Constitutional:      General: She is not in acute distress.    Appearance: She is well-developed.  HENT:     Head: Normocephalic.     Comments: Bruising noted right temporal and periorbital region    Right Ear: External ear normal.     Left Ear: External ear normal.  Eyes:     General: No scleral icterus.       Right eye: No discharge.        Left eye: No discharge.     Conjunctiva/sclera: Conjunctivae normal.  Neck:     Trachea: No tracheal deviation.  Cardiovascular:     Rate and Rhythm: Normal rate and regular rhythm.  Pulmonary:     Effort: Pulmonary effort is normal. No respiratory distress.     Breath sounds: Normal  breath sounds. No stridor. No wheezing or rales.  Abdominal:     General: Bowel sounds are normal. There is no distension.     Palpations: Abdomen is soft.     Tenderness: There is no abdominal tenderness. There is no guarding or rebound.  Musculoskeletal:        General: No deformity.     Cervical back: Neck supple.     Comments: No tenderness palpation bilateral upper extremities or lower extremities, no tenderness in the cervical thoracic or lumbar spine  Skin:    General: Skin is warm and dry.     Findings: No rash.  Neurological:     General: No focal deficit present.     Mental Status: She is alert.     Cranial Nerves: No cranial nerve deficit (no facial droop, extraocular movements intact, no slurred speech).     Sensory: No sensory deficit.     Motor: No abnormal muscle tone or seizure activity.     Coordination: Coordination normal.  Psychiatric:        Mood and Affect: Mood normal.     ED Results / Procedures / Treatments   Labs (all labs ordered are listed, but only abnormal results are displayed) Labs Reviewed - No data to display  EKG None  Radiology CT Maxillofacial WO CM  Result Date: 02/07/2022 CLINICAL DATA:  Patient was trying to sit on desk chair when she missed the chair and fell. Hematoma right Brewer and states she has pain in left neck. EXAM: CT HEAD WITHOUT CONTRAST CT MAXILLOFACIAL WITHOUT CONTRAST CT CERVICAL SPINE WITHOUT CONTRAST TECHNIQUE: Multidetector CT imaging of the head, cervical spine, and maxillofacial structures were performed using the standard protocol without intravenous contrast. Multiplanar CT image reconstructions of the cervical spine and maxillofacial structures were also generated. RADIATION DOSE REDUCTION: This exam was performed according to the departmental dose-optimization program which includes automated exposure control, adjustment of the mA and/or kV according to patient size and/or use of iterative reconstruction technique.  COMPARISON:  None Available. FINDINGS: CT HEAD FINDINGS Brain: No evidence of acute infarction, hemorrhage, hydrocephalus, extra-axial collection or mass lesion/mass effect. Low attenuation of the periventricular and subcortical white matter presumed chronic microvascular ischemic changes. Mild cerebral atrophy. Vascular: No hyperdense vessel or unexpected calcification. Skull: Normal. Negative for fracture or focal lesion. Other: None. CT MAXILLOFACIAL FINDINGS Osseous: No fracture or mandibular dislocation. No destructive process. Orbits: Negative. No traumatic or inflammatory finding. Sinuses: Clear. Soft tissues: Negative. CT CERVICAL SPINE FINDINGS Alignment: Reversal of normal  cervical lordosis. Mild anterolisthesis of C6. Skull base and vertebrae: No acute fracture. No primary bone lesion or focal pathologic process. Soft tissues and spinal canal: No prevertebral fluid or swelling. No visible canal hematoma. Disc levels: Multilevel degenerate disc disease with disc height loss and marginal osteophytes most prominent at C5-C6 and C6-C7. Uncovertebral joint with mild bilateral neural foraminal stenosis at C5-C6, right worse than the left. Multilevel facet joint arthropathy. Upper chest: Moderate centrilobular emphysema. Other: None IMPRESSION: CT head: 1.  No acute intracranial abnormality. 2. Moderate chronic microvascular ischemic changes of the white matter. CT maxillofacial: 1.  No evidence of fracture or dislocation. 2.  No evidence of orbital injury. CT cervical spine: 1.  No acute cervical spine fracture or traumatic subluxation. 2. Multilevel degenerate disc disease of the lumbar spine prominent at C5-C6 and C6-C7. 3. No significant soft tissue injury or evidence of vertebral canal hematoma. Electronically Signed   By: Larose Hires D.O.   On: 02/07/2022 23:21   CT Head Wo Contrast  Result Date: 02/07/2022 CLINICAL DATA:  Patient was trying to sit on desk chair when she missed the chair and fell.  Hematoma right Brewer and states she has pain in left neck. EXAM: CT HEAD WITHOUT CONTRAST CT MAXILLOFACIAL WITHOUT CONTRAST CT CERVICAL SPINE WITHOUT CONTRAST TECHNIQUE: Multidetector CT imaging of the head, cervical spine, and maxillofacial structures were performed using the standard protocol without intravenous contrast. Multiplanar CT image reconstructions of the cervical spine and maxillofacial structures were also generated. RADIATION DOSE REDUCTION: This exam was performed according to the departmental dose-optimization program which includes automated exposure control, adjustment of the mA and/or kV according to patient size and/or use of iterative reconstruction technique. COMPARISON:  None Available. FINDINGS: CT HEAD FINDINGS Brain: No evidence of acute infarction, hemorrhage, hydrocephalus, extra-axial collection or mass lesion/mass effect. Low attenuation of the periventricular and subcortical white matter presumed chronic microvascular ischemic changes. Mild cerebral atrophy. Vascular: No hyperdense vessel or unexpected calcification. Skull: Normal. Negative for fracture or focal lesion. Other: None. CT MAXILLOFACIAL FINDINGS Osseous: No fracture or mandibular dislocation. No destructive process. Orbits: Negative. No traumatic or inflammatory finding. Sinuses: Clear. Soft tissues: Negative. CT CERVICAL SPINE FINDINGS Alignment: Reversal of normal cervical lordosis. Mild anterolisthesis of C6. Skull base and vertebrae: No acute fracture. No primary bone lesion or focal pathologic process. Soft tissues and spinal canal: No prevertebral fluid or swelling. No visible canal hematoma. Disc levels: Multilevel degenerate disc disease with disc height loss and marginal osteophytes most prominent at C5-C6 and C6-C7. Uncovertebral joint with mild bilateral neural foraminal stenosis at C5-C6, right worse than the left. Multilevel facet joint arthropathy. Upper chest: Moderate centrilobular emphysema. Other: None  IMPRESSION: CT head: 1.  No acute intracranial abnormality. 2. Moderate chronic microvascular ischemic changes of the white matter. CT maxillofacial: 1.  No evidence of fracture or dislocation. 2.  No evidence of orbital injury. CT cervical spine: 1.  No acute cervical spine fracture or traumatic subluxation. 2. Multilevel degenerate disc disease of the lumbar spine prominent at C5-C6 and C6-C7. 3. No significant soft tissue injury or evidence of vertebral canal hematoma. Electronically Signed   By: Larose Hires D.O.   On: 02/07/2022 23:21   CT Cervical Spine Wo Contrast  Result Date: 02/07/2022 CLINICAL DATA:  Patient was trying to sit on desk chair when she missed the chair and fell. Hematoma right Brewer and states she has pain in left neck. EXAM: CT HEAD WITHOUT CONTRAST CT MAXILLOFACIAL WITHOUT CONTRAST CT  CERVICAL SPINE WITHOUT CONTRAST TECHNIQUE: Multidetector CT imaging of the head, cervical spine, and maxillofacial structures were performed using the standard protocol without intravenous contrast. Multiplanar CT image reconstructions of the cervical spine and maxillofacial structures were also generated. RADIATION DOSE REDUCTION: This exam was performed according to the departmental dose-optimization program which includes automated exposure control, adjustment of the mA and/or kV according to patient size and/or use of iterative reconstruction technique. COMPARISON:  None Available. FINDINGS: CT HEAD FINDINGS Brain: No evidence of acute infarction, hemorrhage, hydrocephalus, extra-axial collection or mass lesion/mass effect. Low attenuation of the periventricular and subcortical white matter presumed chronic microvascular ischemic changes. Mild cerebral atrophy. Vascular: No hyperdense vessel or unexpected calcification. Skull: Normal. Negative for fracture or focal lesion. Other: None. CT MAXILLOFACIAL FINDINGS Osseous: No fracture or mandibular dislocation. No destructive process. Orbits: Negative. No  traumatic or inflammatory finding. Sinuses: Clear. Soft tissues: Negative. CT CERVICAL SPINE FINDINGS Alignment: Reversal of normal cervical lordosis. Mild anterolisthesis of C6. Skull base and vertebrae: No acute fracture. No primary bone lesion or focal pathologic process. Soft tissues and spinal canal: No prevertebral fluid or swelling. No visible canal hematoma. Disc levels: Multilevel degenerate disc disease with disc height loss and marginal osteophytes most prominent at C5-C6 and C6-C7. Uncovertebral joint with mild bilateral neural foraminal stenosis at C5-C6, right worse than the left. Multilevel facet joint arthropathy. Upper chest: Moderate centrilobular emphysema. Other: None IMPRESSION: CT head: 1.  No acute intracranial abnormality. 2. Moderate chronic microvascular ischemic changes of the white matter. CT maxillofacial: 1.  No evidence of fracture or dislocation. 2.  No evidence of orbital injury. CT cervical spine: 1.  No acute cervical spine fracture or traumatic subluxation. 2. Multilevel degenerate disc disease of the lumbar spine prominent at C5-C6 and C6-C7. 3. No significant soft tissue injury or evidence of vertebral canal hematoma. Electronically Signed   By: Larose Hires D.O.   On: 02/07/2022 23:21    Procedures Procedures    Medications Ordered in ED Medications - No data to display  ED Course/ Medical Decision Making/ A&P                           Medical Decision Making Differential diagnosis includes but not limited to cerebral hemorrhage, facial bone fracture, facial contusion, cervical spine fracture  Amount and/or Complexity of Data Reviewed Radiology: ordered and independent interpretation performed.   Patient presented to the ER for evaluation of a head injury.  CT scan fortunately does not show any signs of serious injury.  No evidence of facial bone fracture, cerebral hemorrhage or cervical spine fracture.  Evaluation and diagnostic testing in the emergency  department does not suggest an emergent condition requiring admission or immediate intervention beyond what has been performed at this time.  The patient is safe for discharge and has been instructed to return immediately for worsening symptoms, change in symptoms or any other concerns.         Final Clinical Impression(s) / ED Diagnoses Final diagnoses:  Contusion of face, initial encounter    Rx / DC Orders ED Discharge Orders     None         Linwood Dibbles, MD 02/07/22 2343

## 2022-02-07 NOTE — Discharge Instructions (Signed)
The CT scans did not show any signs of serious injury.  Apply ice to help with swelling.  Take over-the-counter medications such as Tylenol as needed for pain and discomfort

## 2022-05-28 ENCOUNTER — Emergency Department (HOSPITAL_COMMUNITY)
Admission: EM | Admit: 2022-05-28 | Discharge: 2022-05-28 | Disposition: A | Discharge status: Home / Self Care | Payer: Medicare (Managed Care) | Attending: Emergency Medicine | Admitting: Emergency Medicine

## 2022-05-28 ENCOUNTER — Other Ambulatory Visit: Payer: Self-pay

## 2022-05-28 ENCOUNTER — Encounter (HOSPITAL_COMMUNITY): Payer: Self-pay | Admitting: *Deleted

## 2022-05-28 ENCOUNTER — Emergency Department (HOSPITAL_COMMUNITY): Payer: Medicare (Managed Care)

## 2022-05-28 DIAGNOSIS — J984 Other disorders of lung: Secondary | ICD-10-CM

## 2022-05-28 DIAGNOSIS — Z79899 Other long term (current) drug therapy: Secondary | ICD-10-CM | POA: Insufficient documentation

## 2022-05-28 DIAGNOSIS — R062 Wheezing: Secondary | ICD-10-CM | POA: Diagnosis not present

## 2022-05-28 DIAGNOSIS — R0602 Shortness of breath: Secondary | ICD-10-CM | POA: Insufficient documentation

## 2022-05-28 DIAGNOSIS — R059 Cough, unspecified: Secondary | ICD-10-CM | POA: Insufficient documentation

## 2022-05-28 DIAGNOSIS — I10 Essential (primary) hypertension: Secondary | ICD-10-CM | POA: Diagnosis not present

## 2022-05-28 LAB — CBC WITH DIFFERENTIAL/PLATELET
Abs Immature Granulocytes: 0.01 10*3/uL (ref 0.00–0.07)
Basophils Absolute: 0 10*3/uL (ref 0.0–0.1)
Basophils Relative: 1 %
Eosinophils Absolute: 0.1 10*3/uL (ref 0.0–0.5)
Eosinophils Relative: 1 %
HCT: 33 % — ABNORMAL LOW (ref 36.0–46.0)
Hemoglobin: 10.9 g/dL — ABNORMAL LOW (ref 12.0–15.0)
Immature Granulocytes: 0 %
Lymphocytes Relative: 37 %
Lymphs Abs: 2.4 10*3/uL (ref 0.7–4.0)
MCH: 29.2 pg (ref 26.0–34.0)
MCHC: 33 g/dL (ref 30.0–36.0)
MCV: 88.5 fL (ref 80.0–100.0)
Monocytes Absolute: 0.6 10*3/uL (ref 0.1–1.0)
Monocytes Relative: 10 %
Neutro Abs: 3.3 10*3/uL (ref 1.7–7.7)
Neutrophils Relative %: 51 %
Platelets: 228 10*3/uL (ref 150–400)
RBC: 3.73 MIL/uL — ABNORMAL LOW (ref 3.87–5.11)
RDW: 12.7 % (ref 11.5–15.5)
WBC: 6.5 10*3/uL (ref 4.0–10.5)
nRBC: 0 % (ref 0.0–0.2)

## 2022-05-28 LAB — TROPONIN I (HIGH SENSITIVITY)
Troponin I (High Sensitivity): 5 ng/L (ref <18)
Troponin I (High Sensitivity): 4 ng/L (ref <18)

## 2022-05-28 LAB — BASIC METABOLIC PANEL
Anion gap: 9 (ref 5–15)
BUN: 20 mg/dL (ref 8–23)
CO2: 25 mmol/L (ref 22–32)
Calcium: 9.4 mg/dL (ref 8.9–10.3)
Chloride: 102 mmol/L (ref 98–111)
Creatinine, Ser: 0.79 mg/dL (ref 0.44–1.00)
GFR, Estimated: >60 mL/min (ref >60)
Glucose, Bld: 98 mg/dL (ref 70–99)
Potassium: 4 mmol/L (ref 3.5–5.1)
Sodium: 136 mmol/L (ref 135–145)

## 2022-05-28 LAB — BRAIN NATRIURETIC PEPTIDE: B Natriuretic Peptide: 81 pg/mL (ref 0.0–100.0)

## 2022-05-28 MED ORDER — PREDNISONE 50 MG PO TABS
60.0000 mg | ORAL_TABLET | Freq: Once | ORAL | Status: AC
  Administered 2022-05-28: 60 mg via ORAL
  Filled 2022-05-28: qty 1

## 2022-05-28 MED ORDER — ALBUTEROL SULFATE HFA 108 (90 BASE) MCG/ACT IN AERS
2.0000 | INHALATION_SPRAY | Freq: Once | RESPIRATORY_TRACT | Status: AC
  Administered 2022-05-28: 2 via RESPIRATORY_TRACT
  Filled 2022-05-28: qty 6.7

## 2022-05-28 MED ORDER — AEROCHAMBER PLUS FLO-VU MEDIUM MISC
1.0000 | Freq: Once | Status: AC
  Administered 2022-05-28: 1

## 2022-05-28 MED ORDER — DOXYCYCLINE HYCLATE 100 MG PO CAPS: 100.0000 mg | ORAL_CAPSULE | Freq: Two times a day (BID) | ORAL | 0 refills | Status: DC

## 2022-05-28 MED ORDER — IOHEXOL 350 MG/ML SOLN
75.0000 mL | Freq: Once | INTRAVENOUS | Status: AC | PRN
  Administered 2022-05-28: 75 mL via INTRAVENOUS

## 2022-05-28 MED ORDER — PREDNISONE 20 MG PO TABS: 40.0000 mg | ORAL_TABLET | Freq: Every day | ORAL | 0 refills | Status: DC

## 2022-05-28 NOTE — ED Provider Triage Note (Signed)
Emergency Medicine Provider Triage Evaluation Note  Marie [name] , a 81 y.o. female  was evaluated in triage.  Pt complains of 3 day history of sob, worse with exertion, better at rest but does not resolve.  Denies chest pain,  fever, chills, mild nonprductive cough.  Seen at [facility] in [place] and sent here.  Reports covid and flu testing negative there.    Review of Systems  Positive: Shortness of breath, no cough. Negative: Chest pain, orthopnea, fevers.  Physical Exam  BP (!) 157/66   Pulse 64   Temp 98 F (36.7 C)   Resp 16   Ht 5\' 3"  (1.6 m)   Wt 68 kg   SpO2 98%   BMI 26.57 kg/m  Gen:   Awake, no distress   Resp:  Normal effort mild expiratory wheeze at right base otherwise normal respiratory exam. MSK:   Moves extremities without difficulty  Other:    Medical Decision Making  Medically screening exam initiated at 4:50 PM.  Appropriate orders placed.  Lucita Lora was informed that the remainder of the evaluation will be completed by another provider, this initial triage assessment does not replace that evaluation, and the importance of remaining in the ED until their evaluation is complete.  Patient with a 3-day history of shortness of breath, minimal wheeze at right base, vital signs are stable with no hypoxia.  Pending lab testing results.   Evalee Jefferson, PA-C 05/28/22 1653

## 2022-05-28 NOTE — ED Provider Notes (Signed)
[facility] EMERGENCY DEPARTMENT Provider Note   CSN: [id number] Arrival date & time: 05/28/22  1521     History  Chief Complaint  Patient presents with   Shortness of Breath    Marie [name] is a 81 y.o. female with a history including hypertension, GERD hypercholesterolemia, fibromyalgia and history of anemia presenting with a 3-day history of slowly worsening shortness of breath with exertion.  She also endorses pain in her left rib cage area which is worsened with deep inspiration.  She denies any injury or falls.  She also denies peripheral edema or leg pain, denies orthopnea, fevers or chills.  She has had a mild nonproductive cough.  She does states she has been having some sinus problems secondary to nasal congestion and endorses increased postnasal drip.  She was seen at an urgent care in [place] prior to arriving here.  She had a negative COVID test at the urgent care.  The history is provided by the patient.  Shortness of Breath Associated symptoms: cough   Associated symptoms: no abdominal pain, no chest pain, no diaphoresis, no fever and no vomiting        Home Medications Prior to Admission medications   Medication Sig Start Date End Date Taking? Authorizing Provider  Ascorbic Acid (VITAMIN C PO) Take 1 tablet by mouth every morning.    [provider]  budesonide (ENTOCORT EC) 3 MG 24 hr capsule Take 2 capsules (6 mg) by mouth daily for 6 weeks then reduce to 1 capsule (3 mg) daily. 01/08/22   Sherron Monday, NP  CALCIUM PO Take 1 tablet by mouth every morning.    [provider]  ciprofloxacin (CIPRO) 500 MG tablet Take 1 tablet (500 mg total) by mouth every 12 (twelve) hours. 12/04/21   Evalee Jefferson, PA-C  citalopram (CELEXA) 40 MG tablet Take 40 mg by mouth daily. 12/19/20   [provider]  esomeprazole (NEXIUM) 40 MG capsule Take 40 mg by mouth daily.     [provider]  hydrochlorothiazide (HYDRODIURIL) 12.5 MG tablet Take  12.5 mg by mouth daily. 12/19/20   [provider]  loperamide (IMODIUM) 2 MG capsule Take by mouth as needed for diarrhea or loose stools. Takes 2 tablets as needed.    [provider]  losartan (COZAAR) 50 MG tablet Take 50 mg by mouth every morning.    [provider]  Multiple Vitamins-Minerals (CENTRUM SILVER ULTRA WOMENS PO) Take 1 tablet by mouth every morning.    [provider]  Omega-3 Fatty Acids (FISH OIL) 1000 MG CAPS Take 2 capsules by mouth daily.    [provider]  ondansetron (ZOFRAN ODT) 4 MG disintegrating tablet Take 1 tablet (4 mg total) by mouth every 8 (eight) hours as needed for nausea or vomiting. Patient not taking: Reported on 11/30/2021 01/26/21   Fransico Meadow, PA-C  potassium chloride SA (KLOR-CON) 20 MEQ tablet Take 1 tablet (20 mEq total) by mouth 2 (two) times daily for 7 days. Patient not taking: Reported on 02/14/2021 01/20/21 01/27/21  Evalee Jefferson, PA-C  Probiotic Product (PROBIOTIC DAILY PO) Take 1 capsule by mouth daily.    [provider]  traMADol (ULTRAM) 50 MG tablet Take 50 mg by mouth every 6 (six) hours as needed. For pain    [provider]  triamcinolone cream (KENALOG) 0.5 % 1 application 01/31/30   [provider]  vitamin B-12 (CYANOCOBALAMIN) 1000 MCG tablet Take 1,000 mcg by mouth daily.  [provider]      Allergies    Codeine    Review of Systems   Review of Systems  Constitutional:  Negative for diaphoresis and fever.  HENT:  Positive for congestion and postnasal drip.   Respiratory:  Positive for cough and shortness of breath.   Cardiovascular:  Negative for chest pain, palpitations and leg swelling.  Gastrointestinal:  Negative for abdominal pain, nausea and vomiting.  Musculoskeletal: Negative.   Skin: Negative.   Neurological: Negative.   All other systems reviewed and are negative.   Physical Exam Updated Vital Signs BP (!) 181/76   Pulse 78    Temp 98 F (36.7 C)   Resp (!) 22   Ht 5\' 3"  (1.6 m)   Wt 68 kg   SpO2 97%   BMI 26.57 kg/m  Physical Exam Vitals and nursing note reviewed.  Constitutional:      Appearance: She is well-developed.  HENT:     Head: Normocephalic and atraumatic.  Eyes:     Conjunctiva/sclera: Conjunctivae normal.  Cardiovascular:     Rate and Rhythm: Normal rate and regular rhythm.     Heart sounds: Normal heart sounds.  Pulmonary:     Effort: Pulmonary effort is normal.     Breath sounds: Examination of the right-lower field reveals wheezing. Examination of the left-lower field reveals wheezing. Wheezing present.     Comments: Bilateral expiratory wheeze at bases with prolonged expirations.  No rhonchi or rales. Chest:     Chest wall: No tenderness.  Abdominal:     General: Bowel sounds are normal.     Palpations: Abdomen is soft.     Tenderness: There is no abdominal tenderness.  Musculoskeletal:        General: Normal range of motion.     Cervical back: Normal range of motion.     Right lower leg: No tenderness. No edema.     Left lower leg: No tenderness. No edema.  Skin:    General: Skin is warm and dry.  Neurological:     Mental Status: She is alert.     ED Results / Procedures / Treatments   Labs (all labs ordered are listed, but only abnormal results are displayed) Labs Reviewed  CBC WITH DIFFERENTIAL/PLATELET - Abnormal; Notable for the following components:      Result Value   RBC 3.73 (*)    Hemoglobin 10.9 (*)    HCT 33.0 (*)    All other components within normal limits  BASIC METABOLIC PANEL  BRAIN NATRIURETIC PEPTIDE  TROPONIN I (HIGH SENSITIVITY)  TROPONIN I (HIGH SENSITIVITY)    EKG None  Radiology DG Chest 2 View  Result Date: 05/28/2022 CLINICAL DATA:  Shortness of breath EXAM: CHEST - 2 VIEW COMPARISON:  Previous chest radiographs and CT done on 11/21/2020 FINDINGS: Cardiac size is within normal limits. There are no signs of alveolar pulmonary edema or  focal pulmonary consolidation. There is interval decrease in interstitial markings in both lungs. There is no pleural effusion or pneumothorax. Degenerative changes are noted in right shoulder. IMPRESSION: There are no signs of pulmonary edema or focal pulmonary consolidation. Electronically Signed   By: 11/23/2020 M.D.   On: 05/28/2022 16:07    Procedures Procedures    Medications Ordered in ED Medications  albuterol (VENTOLIN HFA) 108 (90 Base) MCG/ACT inhaler 2 puff (2 puffs Inhalation Given 05/28/22 1831)  AeroChamber Plus Flo-Vu Medium MISC 1 each (1 each Other Given 05/28/22 1831)  predniSONE (  DELTASONE) tablet 60 mg (60 mg Oral Given 05/28/22 1831)    ED Course/ Medical Decision Making/ A&P                           Medical Decision Making Patient with a 3-day history of increasing shortness of breath along with bilateral wheezing at bases, complaint of postnasal drip, concern for acute bronchitis.  She has no chest pain, although endorses left lateral rib cage pain which is worsened with deep inspiration.  Her chest x-ray is clear without pulmonary edema, consolidation, no suggestion of pneumonia.  Additionally her labs are reassuring including negative delta troponin and a negative BNP.  Patient was given albuterol MDI with a spacer 2 puffs, also started on oral prednisone.  She was ambulated in the ED, her oxygen saturations remained above 96%, however her heart rate increased from 88-1 09 and she had complaints of difficulty breathing.  We will send her for CT scan of her chest to rule out pulmonary embolism or other source of shortness of breath not seen on chest x-ray.  Patient signed out to Pauline Aus, PA-C who assumes care.  If CT imaging is negative, patient should be able to be disposed home with additional prednisone and albuterol.  Amount and/or Complexity of Data Reviewed Labs: ordered. Radiology: ordered.  Risk Prescription drug  management.           Final Clinical Impression(s) / ED Diagnoses Final diagnoses:  None    Rx / DC Orders ED Discharge Orders     None         Victoriano Lain 05/28/22 Wilfred Lacy, MD 06/05/22 1125

## 2022-05-28 NOTE — ED Triage Notes (Signed)
Pt with SOB since Friday, gradually gotten worse per pt. No distress noted. Has some left rib pain. Pt states she was seen at [facility] in [place] and sent pt here. Covid test done and was negative.

## 2022-05-28 NOTE — ED Provider Notes (Signed)
  Patient signed out to me by Evalee Jefferson, PA-C pending completion of work-up.  Patient here for evaluation of dyspnea on exertion gradually worsening since today.  Endorses some occasional coughing.  Notes history of shortness of breath that she believes is secondary to a nasal injury and states she is a mouth breather.  See previous provider note for complete H&P  On my exam, lungs are clear to auscultation.  No rhonchi or increased work of breathing.  Patient's work-up today reassuring.  Patient signed out to me pending results of CTA.  CTA without evidence of pulmonary embolus.  There was some groundglass opacities of the lungs bilaterally possibly related to pneumonitis  Patient was ambulated in the department without hypoxia.  Patient is felt to be reasonable for discharge home she was given albuterol MDI prescription for prednisone and doxycycline.  She will follow-up with PCP.  CT Angio Chest PE W and/or Wo Contrast  Result Date: 05/28/2022 CLINICAL DATA:  Pulmonary embolism suspected, high probability. Worsening shortness of breath since Friday. Left rib pain. EXAM: CT ANGIOGRAPHY CHEST WITH CONTRAST TECHNIQUE: Multidetector CT imaging of the chest was performed using the standard protocol during bolus administration of intravenous contrast. Multiplanar CT image reconstructions and MIPs were obtained to evaluate the vascular anatomy. RADIATION DOSE REDUCTION: This exam was performed according to the departmental dose-optimization program which includes automated exposure control, adjustment of the mA and/or kV according to patient size and/or use of iterative reconstruction technique. CONTRAST:  81mL OMNIPAQUE IOHEXOL 350 MG/ML SOLN COMPARISON:  11/21/2020. FINDINGS: Cardiovascular: The heart is normal in size and there is a trace pericardial effusion. Scattered coronary artery calcifications are noted. There is atherosclerotic calcification of the aorta without evidence of aneurysm. The  pulmonary trunk is normal in caliber. No pulmonary artery filling defect is identified. Mediastinum/Nodes: No mediastinal, hilar, or axillary lymphadenopathy. The thyroid gland, trachea, and esophagus are within normal limits. There is a moderate hiatal hernia. Lungs/Pleura: Geographic ground-glass opacities and subpleural reticulations are noted in the lungs bilaterally. No effusion or pneumothorax. Upper Abdomen: No acute abnormality.  Moderate hiatal hernia. Musculoskeletal: Degenerative changes in the thoracic spine. No acute osseous abnormality. Review of the MIP images confirms the above findings. IMPRESSION: 1. No evidence of pulmonary embolism. 2. Regional ground-glass opacities in the lungs bilaterally, which may be associated with edema, air trapping, or pneumonitis. 3. Stable subpleural reticular changes in the lungs bilaterally. 4. Moderate hiatal hernia. 5. Aortic atherosclerosis and coronary artery calcifications. Electronically Signed   By: Brett Fairy M.D.   On: 05/28/2022 20:44   DG Chest 2 View  Result Date: 05/28/2022 CLINICAL DATA:  Shortness of breath EXAM: CHEST - 2 VIEW COMPARISON:  Previous chest radiographs and CT done on 11/21/2020 FINDINGS: Cardiac size is within normal limits. There are no signs of alveolar pulmonary edema or focal pulmonary consolidation. There is interval decrease in interstitial markings in both lungs. There is no pleural effusion or pneumothorax. Degenerative changes are noted in right shoulder. IMPRESSION: There are no signs of pulmonary edema or focal pulmonary consolidation. Electronically Signed   By: Elmer Picker M.D.   On: 05/28/2022 16:07        Kem Parkinson, PA-C 05/28/22 2330    Milton Ferguson, MD 06/05/22 1126

## 2022-05-28 NOTE — ED Notes (Signed)
Pt ambulate ed to the bathroom and back to room with stand by assist\ Pt O2 96-100% Pt HR 88-109 RR between 20-24 Pt states still feels hard to breath Pt currently sitting on side of the bed

## 2022-05-28 NOTE — Discharge Instructions (Addendum)
Take the medications as directed.  Start the prednisone prescription tomorrow.  Stop the budesonide while taking the prednisone.  1 to 2 puffs of the albuterol inhaler every 4-6 hours.  Please follow-up with your primary care provider this week for recheck.  Return to the emergency department for any new or worsening symptoms.  Per your request, The phone number for [facility] is [phone]

## 2022-05-28 NOTE — ED Notes (Signed)
Patient transported to CT 

## 2022-06-23 IMAGING — CT CT ABD-PELV W/ CM
2 of 4 series · 16 of 46 positions shown, 18 images · IV contrast (Omnipaque or Isovue)
Comparison: 01/19/2021

CLINICAL DATA: Left-sided abdominal pain, diarrhea for 2 weeks

EXAM:
CT ABDOMEN AND PELVIS WITH CONTRAST
TECHNIQUE: Multidetector CT imaging of the abdomen and pelvis was performed
using the standard protocol following bolus administration of
intravenous contrast.

[Series 2: axial st · axial · 0.79mm/px · z∈[-353,+7]mm · 13 of 80 slices shown, 15 images]
[im 4/80  soft-tissue]
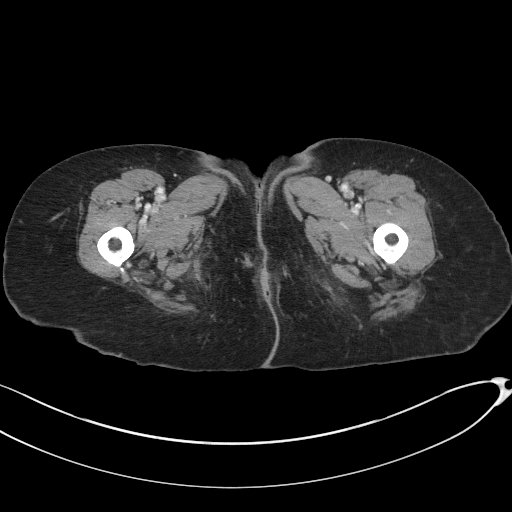
[im 4/80  bone]
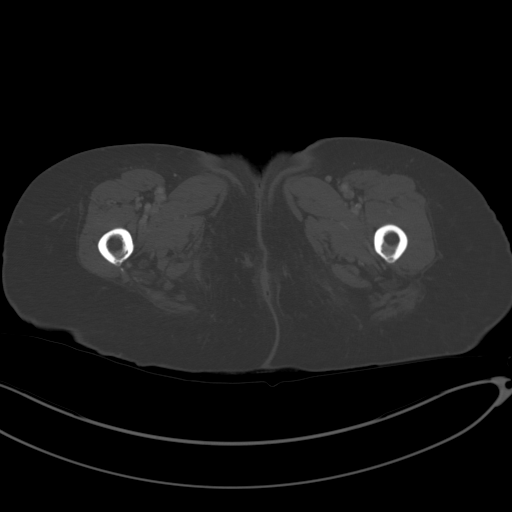
[im 11/80  soft-tissue]
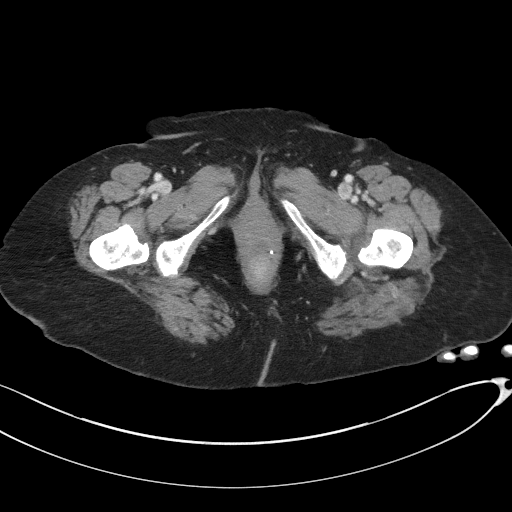
[im 18/80  soft-tissue]
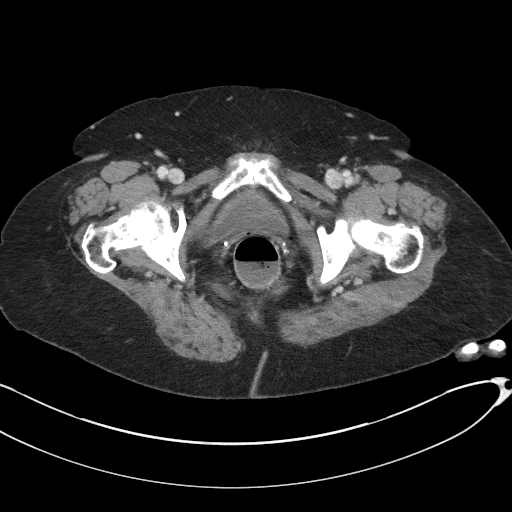
[im 22/80  soft-tissue]
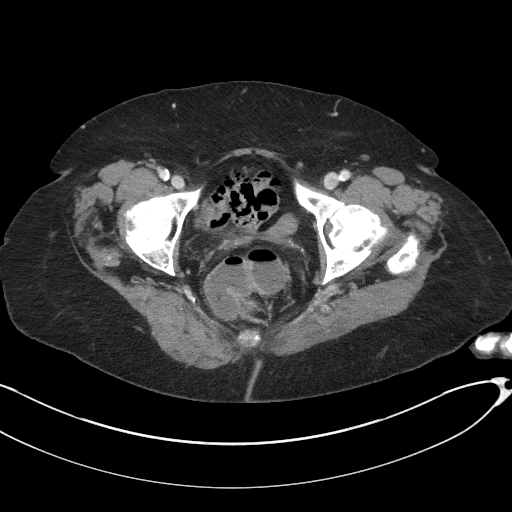
[im 29/80  soft-tissue]
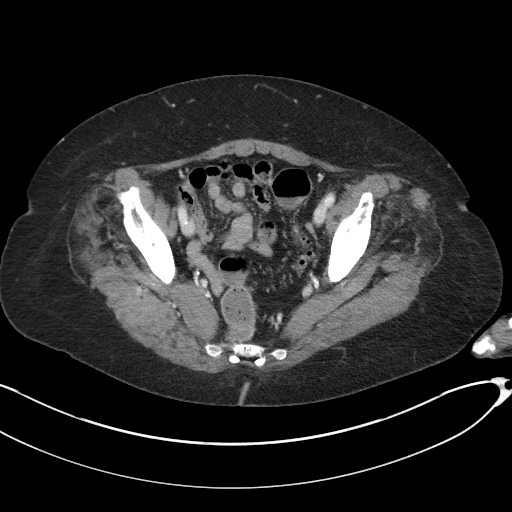
[im 33/80  soft-tissue]
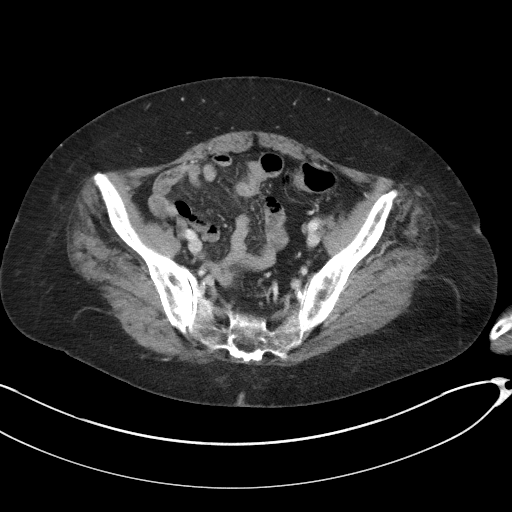
[im 40/80  soft-tissue]
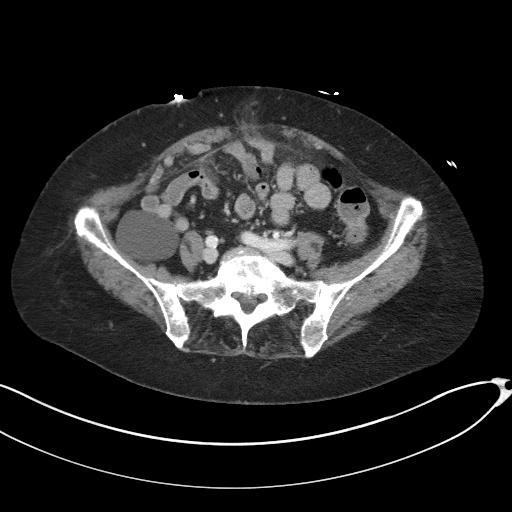
[im 47/80  soft-tissue]
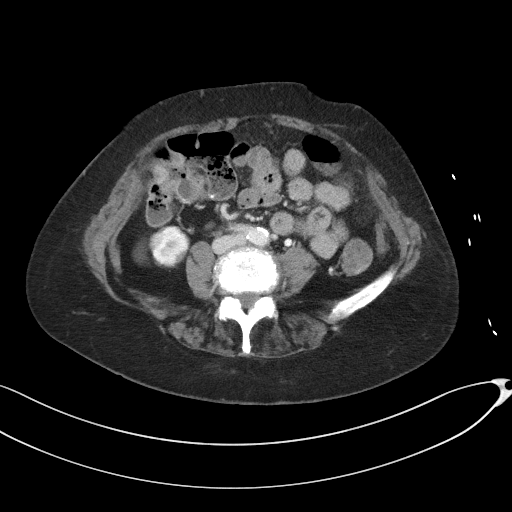
[im 51/80  soft-tissue]
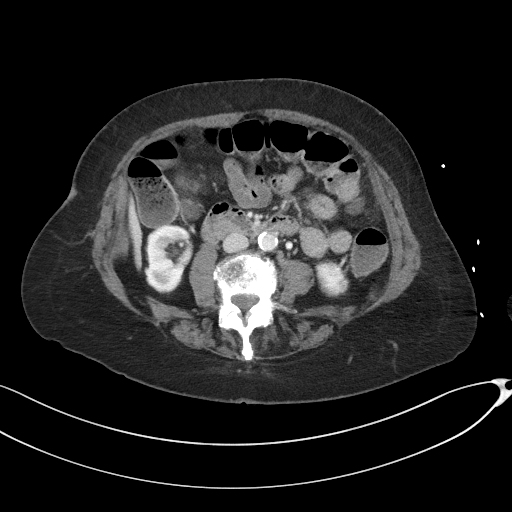
[im 51/80  bone]
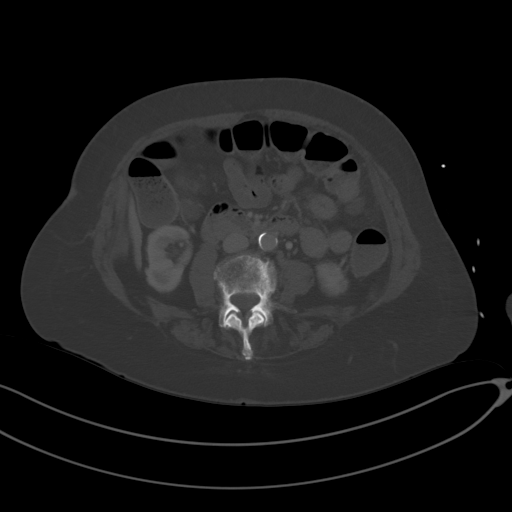
[im 58/80  soft-tissue]
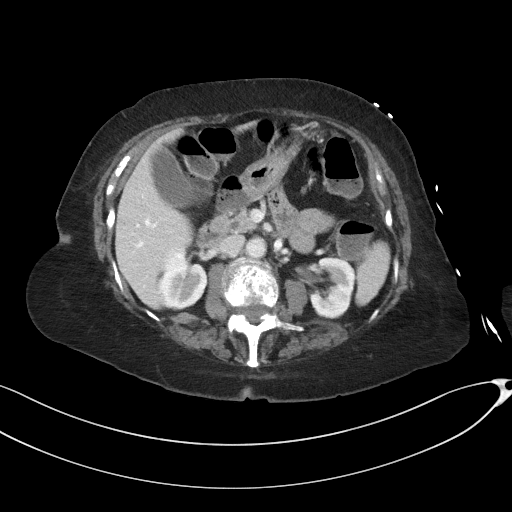
[im 62/80  soft-tissue]
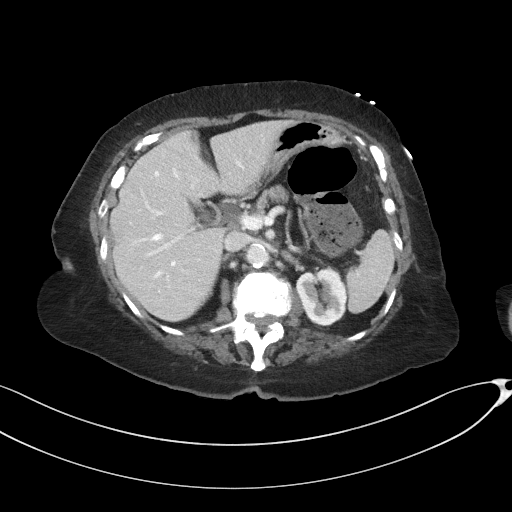
[im 69/80  soft-tissue]
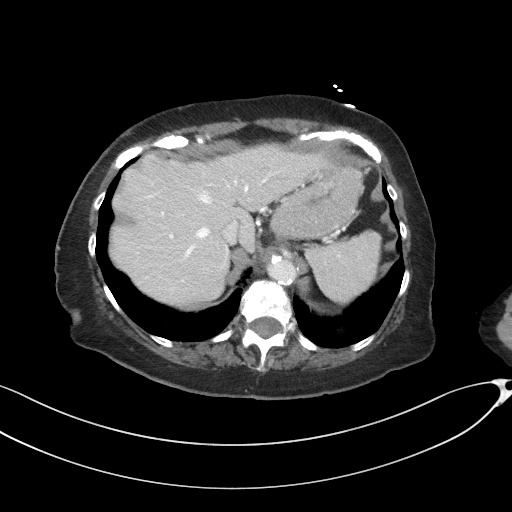
[im 76/80  soft-tissue]
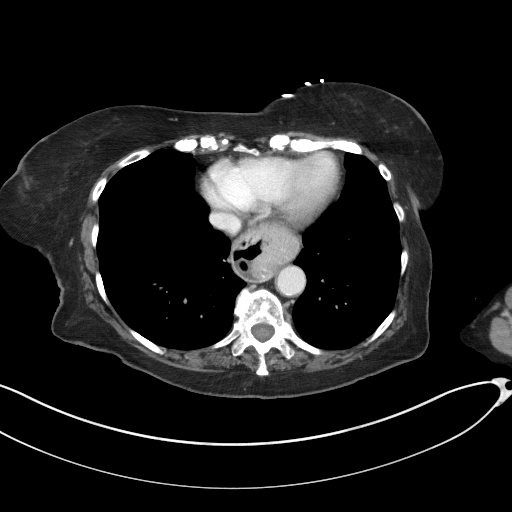

[Series 8: coronal st · coronal · 0.69mm/px · 3 of 96 slices shown]
[im 32/96  soft-tissue]
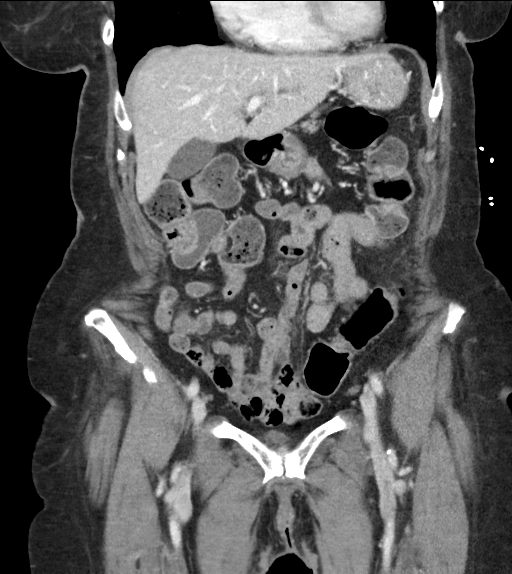
[im 43/96  soft-tissue]
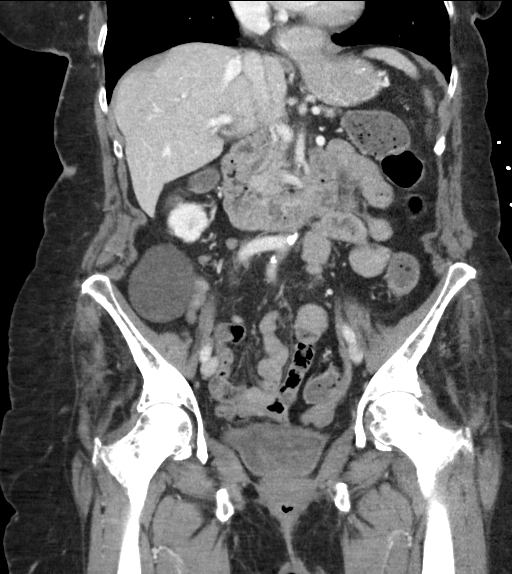
[im 53/96  soft-tissue]
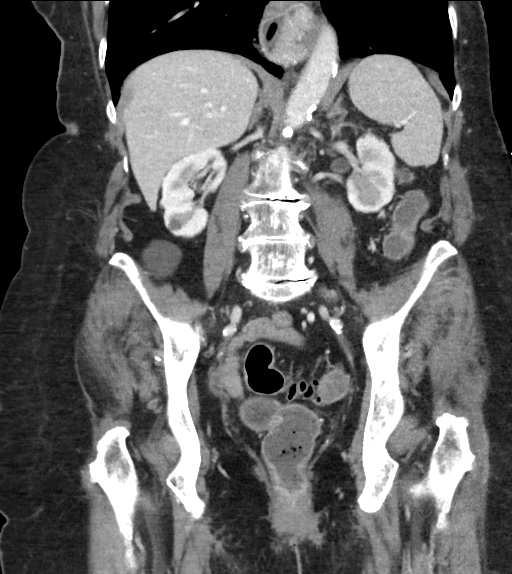

[16 of 46 positions shown; findings below may reference images not displayed]

RADIATION DOSE REDUCTION: This exam was performed according to the
departmental dose-optimization program which includes automated
exposure control, adjustment of the mA and/or kV according to
patient size and/or use of iterative reconstruction technique.

CONTRAST:  100mL OMNIPAQUE IOHEXOL 300 MG/ML  SOLN
FINDINGS: Lower chest: No acute pleural or parenchymal lung disease. Moderate
hiatal hernia again noted.

Hepatobiliary: No focal liver abnormality is seen. No gallstones,
gallbladder wall thickening, or biliary dilatation.

Pancreas: Unremarkable. No pancreatic ductal dilatation or
surrounding inflammatory changes.

Spleen: Normal in size without focal abnormality.

Adrenals/Urinary Tract: Stable appearance of the bilateral kidneys.
No urinary tract calculi or obstruction. The bladder is
decompressed, limiting its evaluation. The adrenals are
unremarkable.

Stomach/Bowel: No bowel obstruction or ileus. Normal appendix right
lower quadrant. No bowel wall thickening or inflammatory change.
Colonic gas fluid levels consistent with diarrhea.

Vascular/Lymphatic: Aortic atherosclerosis. No enlarged abdominal or
pelvic lymph nodes.

Reproductive: Status post hysterectomy. No adnexal masses.

Other: No free fluid or free gas. No abdominal wall hernia. Stable
benign-appearing cystic structure within the right lower quadrant
measuring 5.3 x 4.2 cm.

Musculoskeletal: No acute or destructive bony lesions. Reconstructed
images demonstrate no additional findings.
IMPRESSION: 1. Diffuse colonic gas fluid levels consistent with clinical history
of diarrhea. No bowel obstruction or ileus.
2. Moderate hiatal hernia.
3. Stable right lower quadrant cystic structure, consistent with
benign entity given long-term stability.
4.  Aortic Atherosclerosis ([id number]).

## 2022-08-02 ENCOUNTER — Institutional Professional Consult (permissible substitution): Payer: Medicare (Managed Care) | Admitting: Internal Medicine

## 2022-10-09 ENCOUNTER — Ambulatory Visit: Payer: Medicare (Managed Care) | Admitting: Internal Medicine

## 2022-10-09 ENCOUNTER — Encounter: Payer: Self-pay | Admitting: Internal Medicine

## 2022-10-09 VITALS — BP 192/76 | HR 70 | Temp 97.5°F | Ht 64.0 in | Wt 148.4 lb

## 2022-10-09 DIAGNOSIS — K5909 Other constipation: Secondary | ICD-10-CM | POA: Diagnosis not present

## 2022-10-09 NOTE — Progress Notes (Unsigned)
Primary Care Physician:  [facility]:  Dr. Gala Romney  Pre-Procedure History & Physical: HPI:  Marie [name] is a 82 y.o. female here for evaluation of some left lower quadrant abdominal pain associated with constipation over the last several weeks.  No fever no rectal bleeding.  Says stool is very soft does not feel like she is passing it very well some associated left-sided lower quadrant abdominal pain.  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 Venetia Maxon and is getting spine injections next week. 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  Certainly, she denies diarrhea lately.  No new medications.  Colonoscopy 2013 left-sided diverticula and microscopic colitis on biopsies. GERD well-controlled on Nexium.  Of note, no problems with oral intake no nausea vomiting no fever or chills.  Pressure is elevated today but patient did not take her blood pressure medications.  Past Medical History:  Diagnosis Date   Anemia    Collagenous colitis    colonoscopy 2009   Depression    Fibromyalgia    GERD (gastroesophageal reflux disease)    Hiatal hernia 2008   small   Hypercholesterolemia    Hypertension    IBS (irritable bowel syndrome)    Lymphocytic colitis    colonoscopy 2007, TCS 2013   S/P endoscopy Sept 2008   small hh, s/p 67 French dilator    Past Surgical History:  Procedure Laterality Date   ABDOMINAL HYSTERECTOMY     BIOPSY  01/09/2017   Procedure: BIOPSY;  Surgeon: Daneil Dolin, MD;  Location: [facility];  Service: Endoscopy;;  colon   CATARACT EXTRACTION Bilateral 2021   COLONOSCOPY  04/13/2008   Dr.Julyssa Kyer- anal papilla and internal hemorrhage otherwise normal /left side diverticula   COLONOSCOPY  09/20/2011   Dr.Gene Glazebrook- Adequate preparation. Internal hemorrhoids; otherwise normal/ Left-sided diverticula, biopsies positive for lymphocytic colitis   COLONOSCOPY N/A 01/09/2017    Procedure: COLONOSCOPY;  Surgeon: Daneil Dolin, MD;  Location: [facility];  Service: Endoscopy;  Laterality: N/A;  7:30am   ESOPHAGOGASTRODUODENOSCOPY  04/22/2007   Dr.Alzada Brazee- normal esophagus, small hiatal hernia o/w normal stomach, D1 and D2 s/p passage of a 56-French maloney dilator   fistula of colon     FLEXIBLE SIGMOIDOSCOPY  05/29/2006   Dr.Rehman- No evidence of pseudomembranous or acute colitis.   FOOT SURGERY     KNEE SURGERY     NOSE SURGERY     VEIN SURGERY Bilateral 2021    Prior to Admission medications   Medication Sig Start Date End Date Taking? Authorizing Provider  Ascorbic Acid (VITAMIN C PO) Take 1 tablet by mouth every morning.   Yes [provider]  CALCIUM PO Take 1 tablet by mouth every morning.   Yes [provider]  citalopram (CELEXA) 40 MG tablet Take 40 mg by mouth daily. 12/19/20  Yes [provider]  esomeprazole (NEXIUM) 40 MG capsule Take 40 mg by mouth daily.    Yes [provider]  hydrochlorothiazide (HYDRODIURIL) 12.5 MG tablet Take 12.5 mg by mouth daily. 12/19/20  Yes [provider]  losartan (COZAAR) 50 MG tablet Take 50 mg by mouth every morning.   Yes [provider]  Multiple Vitamins-Minerals (CENTRUM SILVER ULTRA WOMENS PO) Take 1 tablet by mouth every morning.   Yes [provider]  Omega-3 Fatty Acids (FISH OIL) 1000 MG CAPS Take 2 capsules by mouth daily.   Yes [provider]  traMADol (ULTRAM) 50 MG tablet Take 50 mg by mouth every 6 (six) hours as needed. For pain   Yes [provider]  vitamin B-12 (CYANOCOBALAMIN) 1000 MCG tablet Take 1,000 mcg by mouth daily.   Yes [provider]    Allergies as of 10/09/2022 - Review Complete 10/09/2022  Allergen Reaction Noted   Codeine Hives and Swelling 12/27/2015   Promethazine hcl  10/09/2022    Family History  Problem Relation Age of Onset   Colon cancer Maternal Uncle    Colon cancer Maternal  Grandmother     Social History   Socioeconomic History   Marital status: Widowed    Spouse name: Not on file   Number of children: Not on file   Years of education: Not on file   Highest education level: Not on file  Occupational History   Not on file  Tobacco Use   Smoking status: Never   Smokeless tobacco: Never  Vaping Use   Vaping Use: Never used  Substance and Sexual Activity   Alcohol use: No   Drug use: No   Sexual activity: Not Currently  Other Topics Concern   Not on file  Social History Narrative   Not on file   Social Determinants of Health   Financial Resource Strain: Not on file  Food Insecurity: Not on file  Transportation Needs: Not on file  Physical Activity: Not on file  Stress: Not on file  Social Connections: Not on file  Intimate Partner Violence: Not on file    Review of Systems: See HPI, otherwise negative ROS  Physical Exam: BP (!) 146/69 (BP Location: Left Arm, Patient Position: Sitting, Cuff Size: Normal) Comment: Hasn't taken bp meds this am  Pulse (!) 54   Temp (!) 97.5 F (36.4 C) (Oral)   Ht '5\' 4"'$  (1.626 m)   Wt 148 lb 6.4 oz (67.3 kg)   SpO2 99%   BMI 25.47 kg/m  General:   Alert, frail pleasant and cooperative in NAD Neck:  Supple; no masses or thyromegaly. No significant cervical adenopathy. Lungs:  Clear throughout to auscultation.   No wheezes, crackles, or rhonchi. No acute distress. Heart:  Regular rate and rhythm; no murmurs, clicks, rubs,  or gallops. Positive bowel sounds nondistended she does have left lower quadrant tenderness just above her left pelvic brim no appreciable mass no rebound.  Rectal exam good sphincter tone generous amount of soft stool in the proximal rectum.  No mass.  No impaction.  Brown stools Hemoccult negative.   Pulses:  Normal pulses noted. Extremities:  Without clubbing or edema.  Impression/Plan: Pleasant 82 year old lady with debilitating arthritis presents with proceed constipation and  difficulty evacuating.  History of diarrhea and microscopic colitis in the past.  Known diverticulosis and treated empirically for diverticulitis once in the past. Today she is not impacted.  She does have localized left lower quadrant abdominal tenderness.  I am not convinced that she has diverticulitis at this time.  She did not look at all toxic or acutely ill.  Not mentioned above, no new meds recently.  Would like to facilitate bowel function and reassess abdominal pain.   Recommendations:   left lower quadrant abdominal pain.  You are somewhat tender in your left lower quadrant.  You do seem to be constipated you have soft stool in your rectum but no blood present.  You may have a bout of constipation.  Some of your arthritis may be contributing.  I am not convinced  you have diverticulitis.  Continue your fiber Gummies and stool softener  Trial of Linzess 72 1 capsule daily x 10 days.  Samples provided.  Is important to call us back in 7 days to let us know how you are doing.  Continue Nexium or esomeprazole for GERD.  Further recommendations to follow.  Notice: This dictation was prepared with Dragon dictation along with smaller phrase technology. Any transcriptional errors that result from this process are unintentional and may not be corrected upon review.

## 2022-10-09 NOTE — Patient Instructions (Signed)
Recheck blood pressure today   It was good seeing you today.  You have a left lower quadrant abdominal pain.  You are somewhat tender in your left lower quadrant.  You do seem to be constipated you have soft stool in your rectum but no blood present.  You may have a bout of constipation.  Some of your arthritis may be contributing.  I am not convinced you have diverticulitis.  Continue your fiber Gummies and stool softener  Trial of Linzess 72 1 capsule daily x 10 days.  Samples provided.  Is important to call us back in 7 days to let us know how you are doing.  Continue Nexium or esomeprazole for GERD.  Further recommendations to follow.

## 2022-10-16 ENCOUNTER — Telehealth: Payer: Self-pay

## 2022-10-16 NOTE — Telephone Encounter (Signed)
Pt called stating that the linzess 72 was too strong. Pt states that she was taking it every other day and was having runny stools and sometimes would have accidents. Pt is also taking the fiber gummies. Pt states that she is still having side pain. Please advise.

## 2022-10-17 NOTE — Telephone Encounter (Signed)
Pt was made aware and verbalized understanding.  

## 2022-12-10 ENCOUNTER — Encounter (HOSPITAL_COMMUNITY): Payer: Self-pay | Admitting: *Deleted

## 2022-12-10 ENCOUNTER — Emergency Department (HOSPITAL_COMMUNITY): Payer: Medicare (Managed Care)

## 2022-12-10 ENCOUNTER — Emergency Department (HOSPITAL_COMMUNITY)
Admission: EM | Admit: 2022-12-10 | Discharge: 2022-12-10 | Disposition: A | Discharge status: Home / Self Care | Payer: Medicare (Managed Care) | Attending: Emergency Medicine | Admitting: Emergency Medicine

## 2022-12-10 ENCOUNTER — Other Ambulatory Visit: Payer: Self-pay

## 2022-12-10 DIAGNOSIS — M797 Fibromyalgia: Secondary | ICD-10-CM | POA: Diagnosis not present

## 2022-12-10 DIAGNOSIS — R300 Dysuria: Secondary | ICD-10-CM | POA: Insufficient documentation

## 2022-12-10 DIAGNOSIS — R0602 Shortness of breath: Secondary | ICD-10-CM | POA: Diagnosis not present

## 2022-12-10 DIAGNOSIS — I10 Essential (primary) hypertension: Secondary | ICD-10-CM | POA: Insufficient documentation

## 2022-12-10 DIAGNOSIS — Z1152 Encounter for screening for COVID-19: Secondary | ICD-10-CM | POA: Diagnosis not present

## 2022-12-10 DIAGNOSIS — K219 Gastro-esophageal reflux disease without esophagitis: Secondary | ICD-10-CM | POA: Insufficient documentation

## 2022-12-10 DIAGNOSIS — R0609 Other forms of dyspnea: Secondary | ICD-10-CM

## 2022-12-10 LAB — CBC WITH DIFFERENTIAL/PLATELET
Abs Immature Granulocytes: 0.02 10*3/uL (ref 0.00–0.07)
Basophils Absolute: 0 10*3/uL (ref 0.0–0.1)
Basophils Relative: 0 %
Eosinophils Absolute: 0.1 10*3/uL (ref 0.0–0.5)
Eosinophils Relative: 1 %
HCT: 34.2 % — ABNORMAL LOW (ref 36.0–46.0)
Hemoglobin: 11.5 g/dL — ABNORMAL LOW (ref 12.0–15.0)
Immature Granulocytes: 0 %
Lymphocytes Relative: 35 %
Lymphs Abs: 2.6 10*3/uL (ref 0.7–4.0)
MCH: 29.9 pg (ref 26.0–34.0)
MCHC: 33.6 g/dL (ref 30.0–36.0)
MCV: 89.1 fL (ref 80.0–100.0)
Monocytes Absolute: 0.6 10*3/uL (ref 0.1–1.0)
Monocytes Relative: 9 %
Neutro Abs: 4 10*3/uL (ref 1.7–7.7)
Neutrophils Relative %: 55 %
Platelets: 258 10*3/uL (ref 150–400)
RBC: 3.84 MIL/uL — ABNORMAL LOW (ref 3.87–5.11)
RDW: 12.6 % (ref 11.5–15.5)
WBC: 7.4 10*3/uL (ref 4.0–10.5)
nRBC: 0 % (ref 0.0–0.2)

## 2022-12-10 LAB — URINALYSIS, ROUTINE W REFLEX MICROSCOPIC
Bilirubin Urine: NEGATIVE
Glucose, UA: NEGATIVE mg/dL
Hgb urine dipstick: NEGATIVE
Ketones, ur: NEGATIVE mg/dL
Nitrite: NEGATIVE
Protein, ur: 30 mg/dL — AB
Specific Gravity, Urine: 1.014 (ref 1.005–1.030)
WBC, UA: >50 WBC/hpf (ref 0–5)
pH: 8 (ref 5.0–8.0)

## 2022-12-10 LAB — SARS CORONAVIRUS 2 BY RT PCR: SARS Coronavirus 2 by RT PCR: NEGATIVE

## 2022-12-10 LAB — COMPREHENSIVE METABOLIC PANEL
ALT: 15 U/L (ref 0–44)
AST: 24 U/L (ref 15–41)
Albumin: 3.7 g/dL (ref 3.5–5.0)
Alkaline Phosphatase: 71 U/L (ref 38–126)
Anion gap: 11 (ref 5–15)
BUN: 23 mg/dL (ref 8–23)
CO2: 23 mmol/L (ref 22–32)
Calcium: 9.6 mg/dL (ref 8.9–10.3)
Chloride: 100 mmol/L (ref 98–111)
Creatinine, Ser: 0.9 mg/dL (ref 0.44–1.00)
GFR, Estimated: >60 mL/min (ref >60)
Glucose, Bld: 90 mg/dL (ref 70–99)
Potassium: 3.8 mmol/L (ref 3.5–5.1)
Sodium: 134 mmol/L — ABNORMAL LOW (ref 135–145)
Total Bilirubin: 0.8 mg/dL (ref 0.3–1.2)
Total Protein: 7.2 g/dL (ref 6.5–8.1)

## 2022-12-10 LAB — TROPONIN I (HIGH SENSITIVITY)
Troponin I (High Sensitivity): 6 ng/L (ref <18)
Troponin I (High Sensitivity): 7 ng/L (ref <18)

## 2022-12-10 MED ORDER — CEPHALEXIN 500 MG PO CAPS
500.0000 mg | ORAL_CAPSULE | Freq: Once | ORAL | Status: AC
  Administered 2022-12-10: 500 mg via ORAL
  Filled 2022-12-10: qty 1

## 2022-12-10 MED ORDER — CEPHALEXIN 500 MG PO CAPS: 500.0000 mg | ORAL_CAPSULE | Freq: Two times a day (BID) | ORAL | 0 refills | Status: AC

## 2022-12-10 NOTE — ED Notes (Signed)
Ambulated pt from bed to door of room, had to get chair to have pt sit d/t giving out of breathe.  Pt's O2 saturation dropped to 95% however she felt winded & started to not be able to support her body weight in upright position.  EDP made aware.

## 2022-12-10 NOTE — Discharge Instructions (Signed)
Seen today for UTI and continued trouble breathing.  We are giving antibiotics for UTI.  Follow-up with your pulmonologist and primary care doctor, come back to the ER for new worsening symptoms.

## 2022-12-10 NOTE — ED Provider Triage Note (Signed)
Emergency Medicine Provider Triage Evaluation Note  Marie [name] , a 82 y.o. female with past medical history of GERD, fibromyalgia, hypertension, colitis, and anemia.  Endorses history of prior hospitalization for pneumonia.  She was evaluated in triage.  Pt complains of cough, left-sided chest tightness, nasal congestion and some shortness of breath.  Concerned that she has recurrent pneumonia.  She also endorses some burning with urination and concerned about a UTI.  She denies any abdominal pain, fever, chills, or diarrhea  Review of Systems  Positive: Cough, shortness of breath, left-sided lateral chest pain, dysuria Negative: Fever, chills, arm pain, neck or jaw pain, nausea vomiting  Physical Exam  BP (!) 149/82 (BP Location: Right Arm)   Pulse 66   Temp (!) 96.3 F (35.7 C)   Resp 20   Ht 5\' 3"  (1.6 m)   Wt 67.1 kg   SpO2 100%   BMI 26.22 kg/m  Gen:   Awake, no distress   Resp:  Normal effort  MSK:   Moves extremities without difficulty  Other:  No peripheral edema  Medical Decision Making  Medically screening exam initiated at 2:33 PM.  Appropriate orders placed.  Burt Ek was informed that the remainder of the evaluation will be completed by another provider, this initial triage assessment does not replace that evaluation, and the importance of remaining in the ED until their evaluation is complete.     Pauline Aus, PA-C 12/10/22 1444

## 2022-12-10 NOTE — ED Triage Notes (Signed)
Pt with SOB and c/o sinus congestion. Pt states had recent PNA.  Pt with c/o burning with urination.

## 2022-12-10 NOTE — ED Provider Notes (Cosign Needed Addendum)
[place] EMERGENCY DEPARTMENT AT [facility] Provider Note   CSN: [id number] Arrival date & time: 12/10/22  1137     History  Chief Complaint  Patient presents with   Shortness of Breath    Marie [name] is a 82 y.o. female.  She comes into the ER today for 2 separate implants.  States she has been having shortness of breath on exertion that has been ongoing since approximately October of last year intermittently, she has had pneumonia couple times she reports that has been treated but states she still has trouble when she gets up to walk around.  She reports she followed up as an outpatient with cardiology and they said everything was okay with her heart.  She is a lot of trouble breathing through her nose and has followed up with the ENT and has been told she needs a rhinoplasty for deviated septum, this is unchanged, she has a breathing breath.  She is following up with ENT for this at [facility] who did not feel this was the cause of her dyspnea on exertion  She denies any fevers, increased cough or sputum production.  She has not followed up with pulmonology yet.  She also states she has been having dysuria for the past couple of days and wants to have evaluation for UTI.  Denies back or flank pain, no nausea or vomiting.  No fevers or chills.   Shortness of Breath      Home Medications Prior to Admission medications   Medication Sig Start Date End Date Taking? Authorizing Provider  Ascorbic Acid (VITAMIN C PO) Take 1 tablet by mouth every morning.    [provider]  CALCIUM PO Take 1 tablet by mouth every morning.    [provider]  citalopram (CELEXA) 40 MG tablet Take 40 mg by mouth daily. 12/19/20   [provider]  esomeprazole (NEXIUM) 40 MG capsule Take 40 mg by mouth daily.     [provider]  hydrochlorothiazide (HYDRODIURIL) 12.5 MG tablet Take 12.5 mg by mouth daily. 12/19/20   [provider]  losartan (COZAAR) 50  MG tablet Take 50 mg by mouth every morning.    [provider]  Multiple Vitamins-Minerals (CENTRUM SILVER ULTRA WOMENS PO) Take 1 tablet by mouth every morning.    [provider]  Omega-3 Fatty Acids (FISH OIL) 1000 MG CAPS Take 2 capsules by mouth daily.    [provider]  traMADol (ULTRAM) 50 MG tablet Take 50 mg by mouth every 6 (six) hours as needed. For pain    [provider]  vitamin B-12 (CYANOCOBALAMIN) 1000 MCG tablet Take 1,000 mcg by mouth daily.    [provider]      Allergies    Codeine and Promethazine hcl    Review of Systems   Review of Systems  Respiratory:  Positive for shortness of breath.     Physical Exam Updated Vital Signs BP (!) 150/67   Pulse 71   Temp 98.1 F (36.7 C) (Axillary)   Resp 17   Ht 5\' 3"  (1.6 m)   Wt 67.1 kg   SpO2 100%   BMI 26.22 kg/m  Physical Exam Vitals and nursing note reviewed.  Constitutional:      General: She is not in acute distress.    Appearance: She is well-developed.  HENT:     Head: Normocephalic and atraumatic.  Eyes:     Conjunctiva/sclera: Conjunctivae normal.  Cardiovascular:  Rate and Rhythm: Normal rate and regular rhythm.     Pulses: Normal pulses.     Heart sounds: Normal heart sounds. No murmur heard. Pulmonary:     Effort: Pulmonary effort is normal. No respiratory distress.     Breath sounds: No decreased breath sounds, wheezing, rhonchi or rales.  Abdominal:     Palpations: Abdomen is soft.     Tenderness: There is no abdominal tenderness.  Musculoskeletal:        General: No swelling. Normal range of motion.     Cervical back: Neck supple.     Right lower leg: No edema.     Left lower leg: No edema.  Skin:    General: Skin is warm and dry.     Capillary Refill: Capillary refill takes less than 2 seconds.  Neurological:     Mental Status: She is alert.  Psychiatric:        Mood and Affect: Mood normal.     ED Results / Procedures /  Treatments   Labs (all labs ordered are listed, but only abnormal results are displayed) Labs Reviewed  URINALYSIS, ROUTINE W REFLEX MICROSCOPIC - Abnormal; Notable for the following components:      Result Value   APPearance HAZY (*)    Protein, ur 30 (*)    Leukocytes,Ua MODERATE (*)    Bacteria, UA RARE (*)    All other components within normal limits  CBC WITH DIFFERENTIAL/PLATELET - Abnormal; Notable for the following components:   RBC 3.84 (*)    Hemoglobin 11.5 (*)    HCT 34.2 (*)    All other components within normal limits  COMPREHENSIVE METABOLIC PANEL - Abnormal; Notable for the following components:   Sodium 134 (*)    All other components within normal limits  SARS CORONAVIRUS 2 BY RT PCR  URINE CULTURE  TROPONIN I (HIGH SENSITIVITY)  TROPONIN I (HIGH SENSITIVITY)    EKG EKG Interpretation  Date/Time:  Monday Dec 10 2022 11:53:21 EDT Ventricular Rate:  57 PR Interval:  120 QRS Duration: 70 QT Interval:  408 QTC Calculation: 397 R Axis:   47 Text Interpretation: Sinus bradycardia Confirmed by Gloris Manchester [phone]) on 12/10/2022 4:29:02 PM  Radiology DG Chest 2 View  Result Date: 12/10/2022 CLINICAL DATA:  Shortness of breath. EXAM: CHEST - 2 VIEW COMPARISON:  May 28, 2022. FINDINGS: The heart size and mediastinal contours are within normal limits. Mild bibasilar subsegmental atelectasis or scar is noted. The visualized skeletal structures are unremarkable. IMPRESSION: Mild bibasilar subsegmental atelectasis or scarring. Electronically Signed   By: Lupita Raider M.D.   On: 12/10/2022 12:27    Procedures Procedures    Medications Ordered in ED Medications  cephALEXin (KEFLEX) capsule 500 mg (500 mg Oral Given 12/10/22 1914)    ED Course/ Medical Decision Making/ A&P Clinical Course as of 12/10/22 1930  Mon Dec 10, 2022  1929 Patient in bed on pulse ox with nurse, did not drop below 95% but had significant increased work of breathing on walking.  I  discussed with the patient, she states this has been completely unchanged for over 7 months.  She uses a walker at home which helps and she lives with her son who can help her.  She states she has an appointment with pulmonology in [place] for the 30th of this month.  We discussed that she could ask them for a sooner appointment, if no pathology found they could discuss other resources at home and/or cardiac  rehab to help with her stamina. [CB]    Clinical Course User Index [CB] Ma Rings, PA-C                             Medical Decision Making This patient presents to the ED for concern of shortness of breath on exertion ongoing for months unchanged today and dysuria, this involves an extensive number of treatment options, and is a complaint that carries with it a high risk of complications and morbidity.  The differential diagnosis includes pneumonia, pneumonitis, anemia, ACS, stable angina, COPD, asthma, pulmonary fibrosis, other; GI, vaginitis, interstitial cystitis, other   Co morbidities that complicate the patient evaluation  GERD, hypertension, fibromyalgia   Additional history obtained:  Additional history obtained from EMR External records from outside source obtained and reviewed including [facility] note, prior ED notes including prior CT angio of the chest for similar symptoms from October of last year, prior hospital discharge summary   Lab Tests:  I Ordered, and personally interpreted labs.  The pertinent results include: Troponin delta 0, COVID-negative, CBC and CMP are reassuring, patient patient does have UTI   Imaging Studies ordered:  I ordered imaging studies including x-ray chest I independently visualized and interpreted imaging which showed pulmonary edema or infiltrate I agree with the radiologist interpretation     Problem List / ED Course / Critical interventions / Medication management  Has been having dyspnea on exertion for many months,  states she has had pneumonia couple times, no signs of pneumonia today, lungs are clear to auscultation bilaterally, chest x-ray shows some chronic changes but no acute opacity.  She is afebrile, not have any purulent sputum production.  She appears well on exam.  Satting 100% on room air.  Plan to discharge if she ambulates well on room air to have her follow-up with pulmonology.  She reports she already saw cardiology and they did not feel this was the cause of her dyspnea on exertion.  Plan to also treat patient's UTI, urine sent for culture   I have reviewed the patients home medicines and have made adjustments as needed    This chart has been completed using Engineer, civil (consulting) software, and while attempts have been made to ensure accuracy, certain words and phrases may not be transcribed as intended.    Amount and/or Complexity of Data Reviewed Labs: ordered.  Risk Prescription drug management.           Final Clinical Impression(s) / ED Diagnoses Final diagnoses:  None    Rx / DC Orders ED Discharge Orders     None         Ma Rings, PA-C 12/10/22 [address] 12/10/22 1930    Gloris Manchester, MD 12/13/22 0010

## 2022-12-12 LAB — URINE CULTURE: Culture: >=100000 — AB

## 2023-04-10 ENCOUNTER — Other Ambulatory Visit (HOSPITAL_COMMUNITY): Payer: Self-pay | Admitting: Family Medicine

## 2023-04-10 DIAGNOSIS — R109 Unspecified abdominal pain: Secondary | ICD-10-CM

## 2023-04-10 DIAGNOSIS — N23 Unspecified renal colic: Secondary | ICD-10-CM

## 2023-04-10 DIAGNOSIS — R300 Dysuria: Secondary | ICD-10-CM

## 2023-04-18 ENCOUNTER — Ambulatory Visit (HOSPITAL_COMMUNITY)
Admission: RE | Admit: 2023-04-18 | Discharge: 2023-04-18 | Disposition: A | Discharge status: Home / Self Care | Payer: Medicare (Managed Care) | Source: Ambulatory Visit | Attending: Family Medicine | Admitting: Family Medicine

## 2023-04-18 DIAGNOSIS — R300 Dysuria: Secondary | ICD-10-CM | POA: Diagnosis present

## 2023-04-18 DIAGNOSIS — R109 Unspecified abdominal pain: Secondary | ICD-10-CM | POA: Diagnosis present

## 2023-04-18 DIAGNOSIS — N23 Unspecified renal colic: Secondary | ICD-10-CM | POA: Diagnosis present

## 2023-04-24 ENCOUNTER — Observation Stay (HOSPITAL_COMMUNITY): Payer: Medicare (Managed Care)

## 2023-04-24 ENCOUNTER — Emergency Department (HOSPITAL_COMMUNITY): Payer: Medicare (Managed Care)

## 2023-04-24 ENCOUNTER — Observation Stay (HOSPITAL_COMMUNITY)
Admission: EM | Admit: 2023-04-24 | Discharge: 2023-04-25 | Disposition: A | Discharge status: Home Health | Payer: Medicare (Managed Care) | Attending: Internal Medicine | Admitting: Internal Medicine

## 2023-04-24 ENCOUNTER — Other Ambulatory Visit: Payer: Self-pay

## 2023-04-24 ENCOUNTER — Encounter (HOSPITAL_COMMUNITY): Payer: Self-pay | Admitting: *Deleted

## 2023-04-24 DIAGNOSIS — G459 Transient cerebral ischemic attack, unspecified: Secondary | ICD-10-CM

## 2023-04-24 DIAGNOSIS — R109 Unspecified abdominal pain: Secondary | ICD-10-CM | POA: Diagnosis present

## 2023-04-24 DIAGNOSIS — E876 Hypokalemia: Secondary | ICD-10-CM | POA: Diagnosis not present

## 2023-04-24 DIAGNOSIS — R197 Diarrhea, unspecified: Secondary | ICD-10-CM | POA: Diagnosis present

## 2023-04-24 DIAGNOSIS — Z79899 Other long term (current) drug therapy: Secondary | ICD-10-CM | POA: Diagnosis not present

## 2023-04-24 DIAGNOSIS — Z96659 Presence of unspecified artificial knee joint: Secondary | ICD-10-CM | POA: Insufficient documentation

## 2023-04-24 DIAGNOSIS — R4701 Aphasia: Secondary | ICD-10-CM

## 2023-04-24 DIAGNOSIS — F418 Other specified anxiety disorders: Secondary | ICD-10-CM | POA: Diagnosis present

## 2023-04-24 DIAGNOSIS — R0600 Dyspnea, unspecified: Secondary | ICD-10-CM | POA: Diagnosis present

## 2023-04-24 DIAGNOSIS — R2990 Unspecified symptoms and signs involving the nervous system: Secondary | ICD-10-CM | POA: Diagnosis not present

## 2023-04-24 DIAGNOSIS — R479 Unspecified speech disturbances: Secondary | ICD-10-CM | POA: Diagnosis present

## 2023-04-24 DIAGNOSIS — R471 Dysarthria and anarthria: Secondary | ICD-10-CM | POA: Diagnosis present

## 2023-04-24 DIAGNOSIS — R569 Unspecified convulsions: Secondary | ICD-10-CM

## 2023-04-24 DIAGNOSIS — F32A Depression, unspecified: Secondary | ICD-10-CM | POA: Diagnosis present

## 2023-04-24 DIAGNOSIS — M6281 Muscle weakness (generalized): Secondary | ICD-10-CM | POA: Diagnosis not present

## 2023-04-24 DIAGNOSIS — R1084 Generalized abdominal pain: Secondary | ICD-10-CM | POA: Diagnosis not present

## 2023-04-24 DIAGNOSIS — I10 Essential (primary) hypertension: Secondary | ICD-10-CM | POA: Diagnosis not present

## 2023-04-24 DIAGNOSIS — K52832 Lymphocytic colitis: Secondary | ICD-10-CM | POA: Diagnosis present

## 2023-04-24 DIAGNOSIS — K589 Irritable bowel syndrome without diarrhea: Secondary | ICD-10-CM | POA: Diagnosis present

## 2023-04-24 DIAGNOSIS — R209 Unspecified disturbances of skin sensation: Secondary | ICD-10-CM

## 2023-04-24 LAB — CBC WITH DIFFERENTIAL/PLATELET
Abs Immature Granulocytes: 0.01 10*3/uL (ref 0.00–0.07)
Basophils Absolute: 0 10*3/uL (ref 0.0–0.1)
Basophils Relative: 1 %
Eosinophils Absolute: 0.1 10*3/uL (ref 0.0–0.5)
Eosinophils Relative: 2 %
HCT: 32.5 % — ABNORMAL LOW (ref 36.0–46.0)
Hemoglobin: 10.5 g/dL — ABNORMAL LOW (ref 12.0–15.0)
Immature Granulocytes: 0 %
Lymphocytes Relative: 31 %
Lymphs Abs: 1.7 10*3/uL (ref 0.7–4.0)
MCH: 29 pg (ref 26.0–34.0)
MCHC: 32.3 g/dL (ref 30.0–36.0)
MCV: 89.8 fL (ref 80.0–100.0)
Monocytes Absolute: 0.6 10*3/uL (ref 0.1–1.0)
Monocytes Relative: 12 %
Neutro Abs: 2.9 10*3/uL (ref 1.7–7.7)
Neutrophils Relative %: 54 %
Platelets: 206 10*3/uL (ref 150–400)
RBC: 3.62 MIL/uL — ABNORMAL LOW (ref 3.87–5.11)
RDW: 12.2 % (ref 11.5–15.5)
WBC: 5.4 10*3/uL (ref 4.0–10.5)
nRBC: 0 % (ref 0.0–0.2)

## 2023-04-24 LAB — TROPONIN I (HIGH SENSITIVITY)
Troponin I (High Sensitivity): 5 ng/L (ref <18)
Troponin I (High Sensitivity): 9 ng/L (ref <18)

## 2023-04-24 LAB — BASIC METABOLIC PANEL WITH GFR
Anion gap: 11 (ref 5–15)
BUN: 23 mg/dL (ref 8–23)
CO2: 23 mmol/L (ref 22–32)
Calcium: 9 mg/dL (ref 8.9–10.3)
Chloride: 99 mmol/L (ref 98–111)
Creatinine, Ser: 1.12 mg/dL — ABNORMAL HIGH (ref 0.44–1.00)
GFR, Estimated: 50 mL/min — ABNORMAL LOW (ref >60)
Glucose, Bld: 98 mg/dL (ref 70–99)
Potassium: 3.6 mmol/L (ref 3.5–5.1)
Sodium: 133 mmol/L — ABNORMAL LOW (ref 135–145)

## 2023-04-24 LAB — URINALYSIS, W/ REFLEX TO CULTURE (INFECTION SUSPECTED)
Bilirubin Urine: NEGATIVE
Glucose, UA: NEGATIVE mg/dL
Hgb urine dipstick: NEGATIVE
Ketones, ur: 20 mg/dL — AB
Leukocytes,Ua: NEGATIVE
Nitrite: NEGATIVE
Protein, ur: 100 mg/dL — AB
Specific Gravity, Urine: 1.025 (ref 1.005–1.030)
pH: 8 (ref 5.0–8.0)

## 2023-04-24 LAB — RAPID URINE DRUG SCREEN, HOSP PERFORMED
Amphetamines: NOT DETECTED
Barbiturates: NOT DETECTED
Benzodiazepines: NOT DETECTED
Cocaine: NOT DETECTED
Opiates: NOT DETECTED
Tetrahydrocannabinol: NOT DETECTED

## 2023-04-24 LAB — APTT: aPTT: 25 s (ref 24–36)

## 2023-04-24 LAB — BRAIN NATRIURETIC PEPTIDE: B Natriuretic Peptide: 76 pg/mL (ref 0.0–100.0)

## 2023-04-24 LAB — PROTIME-INR
INR: 1.1 (ref 0.8–1.2)
Prothrombin Time: 14 s (ref 11.4–15.2)

## 2023-04-24 LAB — ETHANOL: Alcohol, Ethyl (B): <10 mg/dL (ref <10)

## 2023-04-24 MED ORDER — BUDESONIDE 0.25 MG/2ML IN SUSP
2.0000 mL | Freq: Every day | RESPIRATORY_TRACT | Status: DC
  Administered 2023-04-25: 0.25 mg via RESPIRATORY_TRACT
  Filled 2023-04-24: qty 2

## 2023-04-24 MED ORDER — HYDRALAZINE HCL 20 MG/ML IJ SOLN
5.0000 mg | INTRAMUSCULAR | Status: DC | PRN
  Administered 2023-04-24: 5 mg via INTRAVENOUS
  Filled 2023-04-24: qty 1

## 2023-04-24 MED ORDER — SODIUM CHLORIDE 0.9 % IV SOLN: INTRAVENOUS | Status: AC

## 2023-04-24 MED ORDER — ENOXAPARIN SODIUM 40 MG/0.4ML IJ SOSY
40.0000 mg | PREFILLED_SYRINGE | Freq: Every day | INTRAMUSCULAR | Status: DC
  Administered 2023-04-24: 40 mg via SUBCUTANEOUS
  Filled 2023-04-24: qty 0.4

## 2023-04-24 MED ORDER — ACETAMINOPHEN 650 MG RE SUPP: 650.0000 mg | Freq: Four times a day (QID) | RECTAL | Status: DC | PRN

## 2023-04-24 MED ORDER — IOHEXOL 300 MG/ML  SOLN
80.0000 mL | Freq: Once | INTRAMUSCULAR | Status: AC | PRN
  Administered 2023-04-24: 80 mL via INTRAVENOUS

## 2023-04-24 MED ORDER — ONDANSETRON HCL 4 MG/2ML IJ SOLN: 4.0000 mg | Freq: Four times a day (QID) | INTRAMUSCULAR | Status: DC | PRN

## 2023-04-24 MED ORDER — IPRATROPIUM BROMIDE 0.02 % IN SOLN: 2.5000 mL | Freq: Four times a day (QID) | RESPIRATORY_TRACT | Status: DC | PRN

## 2023-04-24 MED ORDER — POTASSIUM CHLORIDE CRYS ER 20 MEQ PO TBCR
40.0000 meq | EXTENDED_RELEASE_TABLET | Freq: Once | ORAL | Status: AC
  Administered 2023-04-24: 40 meq via ORAL
  Filled 2023-04-24: qty 2

## 2023-04-24 MED ORDER — IPRATROPIUM-ALBUTEROL 0.5-2.5 (3) MG/3ML IN SOLN: 3.0000 mL | Freq: Three times a day (TID) | RESPIRATORY_TRACT | Status: DC

## 2023-04-24 MED ORDER — POLYETHYLENE GLYCOL 3350 17 G PO PACK: 17.0000 g | PACK | Freq: Every day | ORAL | Status: DC | PRN

## 2023-04-24 MED ORDER — IPRATROPIUM-ALBUTEROL 0.5-2.5 (3) MG/3ML IN SOLN
3.0000 mL | Freq: Three times a day (TID) | RESPIRATORY_TRACT | Status: DC
  Administered 2023-04-25 (×2): 3 mL via RESPIRATORY_TRACT
  Filled 2023-04-24 (×2): qty 3

## 2023-04-24 MED ORDER — ACETAMINOPHEN 325 MG PO TABS: 650.0000 mg | ORAL_TABLET | Freq: Four times a day (QID) | ORAL | Status: DC | PRN

## 2023-04-24 MED ORDER — ONDANSETRON HCL 4 MG PO TABS: 4.0000 mg | ORAL_TABLET | Freq: Four times a day (QID) | ORAL | Status: DC | PRN

## 2023-04-24 NOTE — ED Triage Notes (Signed)
Pt arrived with c/o shaking to bilateral hands and legs.  Pt denies any weakness on one side or numbness or tingling. Pt's son states pt with generalized weakness and recently frequently with UTI's.  Son states pt had some left facial droop on way here.  Pt without any facial droop at present.  Speech clear.

## 2023-04-24 NOTE — ED Notes (Signed)
Pt appears calm talking and laughing with family at bedside with no obvious symptoms.

## 2023-04-24 NOTE — Assessment & Plan Note (Signed)
Chronic abdominal pain over the past several months, with intermittent diarrhea.  No change in bowel habits at this time.  She reports 3 episodes of vomiting today.  History of microscopic colitis, IBS.  No recent abdominal imaging. -Obtain CT abdomen and pelvis with contrast - N/s 100cc/hr x 20hrs -Clear liquid diet for now - PRN Zofran

## 2023-04-24 NOTE — Consult Note (Addendum)
[facility] Telemedicine Consult   Requesting Provider: Dr Rhunette Croft Consult Participants: Dr. Marthe Patch, Telespecialist RN Misty   Bedside RN Joni Reining Location of the provider: Home Location of the patient: [facility] ER - 6  This consult was provided via telemedicine with 2-way video and audio communication. The patient/family was informed that care would be provided in this way and agreed to receive care in this manner.   Chief Complaint: speech difficulty and bilateral hand tingling  HPI: 82/F with PMH of fibromyalgia, depression, HTN, HLD, Presented tot he ER at [facility] for concern of sudden onset of dyspnea. She also in addition to dyspnea, started having tingling in both her hands and some point the son noted that she had difficulty speaking. By the time EDP saw her, symptoms resolved. He was called a while later to re-examine her due to return of symptoms. By the time I saw her on camera, she had no speaking trouble and was able to participate with the exam. She had +SOB today. Due to her history of anxiety and depression, concern for possible panic attack.   More history obtained later on from the son by the EDP-traveling in the car while coming to the hospital she had some outstretching of her left arm with almost automatisms with the arm which lasted quite a while as they drove to the hospital but then resolved.  She also had some facial droop at that time-unclear which side.  When EDP evaluated her, her symptoms had resolved and second time when the symptoms recurred in the ED they were much short-lived.   Past Medical History:  Diagnosis Date   Anemia    Collagenous colitis    colonoscopy 2009   Depression    Fibromyalgia    GERD (gastroesophageal reflux disease)    Hiatal hernia 2008   small   Hypercholesterolemia    Hypertension    IBS (irritable bowel syndrome)    Lymphocytic colitis    colonoscopy 2007, TCS 2013   S/P endoscopy Sept 2008   small hh, s/p 45 French dilator     No current facility-administered medications for this encounter.  Current Outpatient Medications:    Ascorbic Acid (VITAMIN C PO), Take 1 tablet by mouth every morning., Disp: , Rfl:    CALCIUM PO, Take 1 tablet by mouth every morning., Disp: , Rfl:    citalopram (CELEXA) 40 MG tablet, Take 40 mg by mouth daily., Disp: , Rfl:    esomeprazole (NEXIUM) 40 MG capsule, Take 40 mg by mouth daily. , Disp: , Rfl:    hydrochlorothiazide (HYDRODIURIL) 12.5 MG tablet, Take 12.5 mg by mouth daily., Disp: , Rfl:    losartan (COZAAR) 50 MG tablet, Take 50 mg by mouth every morning., Disp: , Rfl:    Multiple Vitamins-Minerals (CENTRUM SILVER ULTRA WOMENS PO), Take 1 tablet by mouth every morning., Disp: , Rfl:    Omega-3 Fatty Acids (FISH OIL) 1000 MG CAPS, Take 2 capsules by mouth daily., Disp: , Rfl:    traMADol (ULTRAM) 50 MG tablet, Take 50 mg by mouth every 6 (six) hours as needed. For pain, Disp: , Rfl:    vitamin B-12 (CYANOCOBALAMIN) 1000 MCG tablet, Take 1,000 mcg by mouth daily., Disp: , Rfl:     LKW: Unclear IV thrombolysis given?: No, nonfocal exam IR Thrombectomy? No, nonfocal exam Modified Rankin Scale: 1-No significant post stroke disability and can perform usual duties with stroke symptoms Time of teleneurologist evaluation: 5:10 PM  Exam: Vitals:   04/24/23 1518  BP: (!) 161/90  Pulse: 70  Resp: 18  Temp: 97.8 F (36.6 C)  SpO2: 99%    General: Awake alert in no distress Neurological exam Awake alert oriented x 3 Speech is mildly hypophonic No dysarthria No evidence of aphasia Cranials 2-12 intact Motor examination no drift Sensation with mild diminishment of sensation on the right leg otherwise intact. Coordination unremarkable for ataxia  Stroke scale-1 for sensory  Imaging Reviewed:  CT head no acute changes.  Labs reviewed in epic and pertinent values follow: CBC    Component Value Date/Time   WBC 5.4 04/24/2023 1656   RBC 3.62 (L) 04/24/2023 1656    HGB 10.5 (L) 04/24/2023 1656   HCT 32.5 (L) 04/24/2023 1656   HCT 35 12/10/2011 0805   PLT 206 04/24/2023 1656   MCV 89.8 04/24/2023 1656   MCH 29.0 04/24/2023 1656   MCHC 32.3 04/24/2023 1656   RDW 12.2 04/24/2023 1656   LYMPHSABS 1.7 04/24/2023 1656   MONOABS 0.6 04/24/2023 1656   EOSABS 0.1 04/24/2023 1656   BASOSABS 0.0 04/24/2023 1656   CMP     Component Value Date/Time   NA 134 (L) 12/10/2022 1542   NA 138 12/10/2011 0806   K 3.8 12/10/2022 1542   K 4.2 12/10/2011 0806   CL 100 12/10/2022 1542   CL 102 12/10/2011 0806   CO2 23 12/10/2022 1542   CO2 30 12/10/2011 0806   GLUCOSE 90 12/10/2022 1542   BUN 23 12/10/2022 1542   BUN 14 12/10/2011 0806   CREATININE 0.90 12/10/2022 1542   CREATININE 1.18 (H) 11/30/2021 1559   CALCIUM 9.6 12/10/2022 1542   CALCIUM 9.5 12/10/2011 0806   PROT 7.2 12/10/2022 1542   PROT 6.2 12/10/2011 0806   ALBUMIN 3.7 12/10/2022 1542   ALBUMIN 3.4 12/10/2011 0806   AST 24 12/10/2022 1542   AST 24 12/10/2011 0806   ALT 15 12/10/2022 1542   ALKPHOS 71 12/10/2022 1542   ALKPHOS 68 12/10/2011 0806   BILITOT 0.8 12/10/2022 1542   BILITOT 0.5 12/10/2011 0806   GFRNONAA >60 12/10/2022 1542   GFRAA 55 (L) 01/01/2017 1658     Assessment: 82 year old woman with past history of fibromyalgia depression hypertension hyperlipidemia presented for evaluation of dyspnea and then complained of bilateral hand tingling and numbness followed by an episode of inability to produce her words. Some concern for facial weakness along with bilateral tingling-with the caveat that she has a lot of anxiety and there was concern that this might be a panic attack, the automatisms and aphasia raise concern for a cortical process. Differentials include stroke versus seizure versus nonorganic etiology  Recommendations:  Workup of the dyspnea per primary team Admit for observation MRI brain with and without contrast Routine EEG in the morning Maintain seizure  precautions Do not initiate stroke workup unless the MRI shows a stroke because this does not sound like a TIA since it was very stereotypic. Check B12 TSH, UA and chest x-ray. Discussed with ED provider Dr. Rhunette Croft. Will notify the on-call neurologist for follow-ups to follow-up tomorrow. Primary team also please touch base with the neurologist on-call for rounding tomorrow to get final set of recommendations.  -- Milon Dikes, MD Neurologist [facility] Pager: [phone]

## 2023-04-24 NOTE — ED Notes (Signed)
Pt had a positive swallow screening

## 2023-04-24 NOTE — Assessment & Plan Note (Addendum)
Transient episode of aphasia, questionable slurred speech, left facial droop, numbness and tingling to extremities, EDP reports left-sided weakness on exam. But on my evaluation, 5 of 5 strength to all extremities without focal deficits.  Speech intact. -Code stroke called, patient back to baseline. -Evaluated by teleneurologist, recommended MRI  W Wo, stroke workup only if positive MRI. - Unfortunately only MRI without contrast could be too done, as patient did not tolerate MRI-study limited by motion, no acute intracranial process -EEG -UA pending, she reports persistent intermittent dysuria, and reports completing 3 courses of antibiotics in the past 3 months. -She appears dehydrated, hydrate - Inpatient neurology consult

## 2023-04-24 NOTE — ED Notes (Signed)
Phlebotomist informed this RN that pt suddenly lost ability to communicate. RN to bedside to assess, pt slurring words and grabbing L side of face. Second RN called EDP to bedside. Pt regained ability to speak, complained about numbness to hand and face.   18 g L AC placed.

## 2023-04-24 NOTE — H&P (Addendum)
History and Physical    EMERII ESCANDON ZOX:[id number] DOB: November 11, 1940 DOA: 04/24/2023  PCP: [facility]   Patient coming from: Home  I have personally briefly reviewed patient's old medical records in [facility]  Chief Complaint: Abdominal pain, shaking of extremities  HPI: Marie [name] is a 82 y.o. female with medical history significant for microscopic colitis, hypertension, depression and fibromyalgia. Patient reports diffuse abdominal pain over the past several months at least, with chronic intermittent diarrhea, on average 0-4 times a day-which she attributes to her microscopic colitis.  She reports 3 episodes of vomiting today only.  No fever no chills.  She also reports difficulty breathing denies chronic over the past year at least, for which she sees cardiology and pulmonology, and also followed up with ENT and was diagnosed with some structural abnormality (?  Deviation of nasal septum ) for which she needs surgical correction.  He tells me she breathes better with her mouth. Son was driving patient to the ED when enroute-patient started having shaking to her bilateral upper and lower extremities, son also reported left facial droop, tells me she remembers these episodes, and her extremities felt tight.  She tells me resolved on arrival to the ED.  In the ED she had 2 episodes where she was unable to speak, this was noted by ED staff , this was transient, EDP also noted some weakness to her left side.  ED Course: Blood pressure systolic 140s to 811 systolic. Code stroke called.  Chest x-ray probable left retrocardiac atelectasis.  Head CT negative for acute abnormality. Teleneurologist recommended MRI brain with and without contrast, fortunately MRI contrast could not be done as patient did not tolerate MRI.  Stroke workup only if MRI is positive for stroke. Differentials- stroke versus seizure versus nonorganic etiology.  Review of Systems: As per HPI  all other systems reviewed and negative.  Past Medical History:  Diagnosis Date   Anemia    Collagenous colitis    colonoscopy 2009   Depression    Fibromyalgia    GERD (gastroesophageal reflux disease)    Hiatal hernia 2008   small   Hypercholesterolemia    Hypertension    IBS (irritable bowel syndrome)    Lymphocytic colitis    colonoscopy 2007, TCS 2013   S/P endoscopy Sept 2008   small hh, s/p 24 French dilator    Past Surgical History:  Procedure Laterality Date   ABDOMINAL HYSTERECTOMY     BIOPSY  01/09/2017   Procedure: BIOPSY;  Surgeon: Corbin Ade, MD;  Location: [facility];  Service: Endoscopy;;  colon   CATARACT EXTRACTION Bilateral 2021   COLONOSCOPY  04/13/2008   Dr.Rourk- anal papilla and internal hemorrhage otherwise normal /left side diverticula   COLONOSCOPY  09/20/2011   Dr.Rourk- Adequate preparation. Internal hemorrhoids; otherwise normal/ Left-sided diverticula, biopsies positive for lymphocytic colitis   COLONOSCOPY N/A 01/09/2017   Procedure: COLONOSCOPY;  Surgeon: Corbin Ade, MD;  Location: [facility];  Service: Endoscopy;  Laterality: N/A;  7:30am   ESOPHAGOGASTRODUODENOSCOPY  04/22/2007   Dr.Rourk- normal esophagus, small hiatal hernia o/w normal stomach, D1 and D2 s/p passage of a 56-French maloney dilator   fistula of colon     FLEXIBLE SIGMOIDOSCOPY  05/29/2006   Dr.Rehman- No evidence of pseudomembranous or acute colitis.   FOOT SURGERY     KNEE SURGERY     NOSE SURGERY     VEIN SURGERY Bilateral 2021  reports that she has never smoked. She has never used smokeless tobacco. She reports that she does not drink alcohol and does not use drugs.  Allergies  Allergen Reactions   Codeine Hives and Swelling   Promethazine Hcl     Other Reaction(s): swell up    Family History  Problem Relation Age of Onset   Colon cancer Maternal Uncle    Colon cancer Maternal Grandmother     Prior to Admission medications   Medication Sig  Start Date End Date Taking? Authorizing Provider  azelastine (ASTELIN) 0.1 % nasal spray Place 1 spray into both nostrils 2 (two) times daily. 01/22/23  Yes [provider]  ipratropium-albuterol (DUONEB) 0.5-2.5 (3) MG/3ML SOLN Take 3 mLs by nebulization 3 (three) times daily. 01/03/23  Yes [provider]  PAXLOVID, 150/100, 10 x 150 MG & 10 x 100MG  TBPK Take 2 tablets by mouth 2 (two) times daily. 03/19/23  Yes [provider]  amoxicillin-clavulanate (AUGMENTIN) 875-125 MG tablet Take 1 tablet by mouth 2 (two) times daily. 04/10/23   [provider]  Ascorbic Acid (VITAMIN C PO) Take 1 tablet by mouth every morning.    [provider]  budesonide (PULMICORT) 0.25 MG/2ML nebulizer solution Take 2 mLs by nebulization daily. 01/03/23   [provider]  CALCIUM PO Take 1 tablet by mouth every morning.    [provider]  cefUROXime (CEFTIN) 250 MG tablet Take 250 mg by mouth 2 (two) times daily with a meal. 04/22/23   [provider]  cefUROXime (CEFTIN) 500 MG tablet Take 500 mg by mouth 2 (two) times daily with a meal. 02/26/23   [provider]  ciprofloxacin (CIPRO) 250 MG tablet Take 250 mg by mouth 2 (two) times daily. 03/27/23   [provider]  citalopram (CELEXA) 40 MG tablet Take 40 mg by mouth daily. 12/19/20   [provider]  doxycycline (ADOXA) 150 MG tablet Take 150 mg by mouth 2 (two) times daily.    [provider]  esomeprazole (NEXIUM) 40 MG capsule Take 40 mg by mouth daily.     [provider]  hydrochlorothiazide (HYDRODIURIL) 12.5 MG tablet Take 12.5 mg by mouth daily. 12/19/20   [provider]  ipratropium (ATROVENT) 0.02 % nebulizer solution Take 2.5 mLs by nebulization every 6 (six) hours as needed for wheezing or shortness of breath. 02/05/23   [provider]  losartan (COZAAR) 100 MG tablet Take 100 mg by mouth daily.    [provider]   losartan (COZAAR) 50 MG tablet Take 50 mg by mouth every morning.    [provider]  Multiple Vitamins-Minerals (CENTRUM SILVER ULTRA WOMENS PO) Take 1 tablet by mouth every morning.    [provider]  Omega-3 Fatty Acids (FISH OIL) 1000 MG CAPS Take 2 capsules by mouth daily.    [provider]  predniSONE (DELTASONE) 20 MG tablet Take 20 mg by mouth daily with breakfast.    [provider]  promethazine-dextromethorphan (PROMETHAZINE-DM) 6.25-15 MG/5ML syrup Take 5 mLs by mouth 4 (four) times daily as needed for cough. 01/03/23   [provider]  traMADol (ULTRAM) 50 MG tablet Take 50 mg by mouth every 6 (six) hours as needed. For pain    [provider]  vitamin B-12 (CYANOCOBALAMIN) 1000 MCG tablet Take 1,000 mcg by mouth daily.    [provider]    Physical Exam: Vitals:   04/24/23 1845 04/24/23 1951 04/24/23 2000 04/24/23 2015  BP: (!) 149/90   (!) 164/90  Pulse: 67  65 67  Resp: 12  18 (!) 23  Temp:  97.6 F (36.4 C)    TempSrc:  Oral    SpO2: 100%  98% 100%  Weight:      Height:        Constitutional: NAD, calm, comfortable Vitals:   04/24/23 1845 04/24/23 1951 04/24/23 2000 04/24/23 2015  BP: (!) 149/90   (!) 164/90  Pulse: 67  65 67  Resp: 12  18 (!) 23  Temp:  97.6 F (36.4 C)    TempSrc:  Oral    SpO2: 100%  98% 100%  Weight:      Height:       Eyes: PERRL, lids and conjunctivae normal ENMT: Mucous membranes are Dry.  Neck: normal, supple, no masses, no thyromegaly Respiratory: clear to auscultation bilaterally, no wheezing, no crackles. Normal respiratory effort. No accessory muscle use.  Cardiovascular: Regular rate and rhythm, no murmurs / rubs / gallops. No extremity edema.  Extremities warm Abdomen: no tenderness, no masses palpated. No hepatosplenomegaly. Bowel sounds positive.  Musculoskeletal: no clubbing / cyanosis. No joint deformity upper and lower extremities.  Skin: no rashes,  lesions, ulcers. No induration Neurologic:  Neurological:     Mental Status: She is alert.     GCS: GCS eye subscore is 4. GCS verbal subscore is 5. GCS motor subscore is 6.     Comments: Mental Status:  Alert, oriented, thought content appropriate, able to give a coherent history. Speech fluent without evidence of aphasia.  Cranial Nerves:  II:  Peripheral visual fields grossly normal, pupils equal, round, reactive to light III,IV, VI: ptosis not present, extra-ocular motions intact bilaterally  V,VII: smile symmetric, eyebrows raise symmetric, facial light touch sensation equal VIII: hearing grossly normal to voice  XII: midline tongue extension without fassiculations Motor:  Normal tone.  5 of 5 strength bilateral upper and lower extremities, reports tightness to bilateral upper extremities, otherwise sensation intact globally..  CV: distal pulses palpable throughout  Psychiatric: Normal judgment and insight. Alert and oriented x 3.  Mildly anxious.   Labs on Admission: I have personally reviewed following labs and imaging studies  CBC: Recent Labs  Lab 04/24/23 1656  WBC 5.4  NEUTROABS 2.9  HGB 10.5*  HCT 32.5*  MCV 89.8  PLT 206   Basic Metabolic Panel: Recent Labs  Lab 04/24/23 1656  NA 133*  K 3.6  CL 99  CO2 23  GLUCOSE 98  BUN 23  CREATININE 1.12*  CALCIUM 9.0   Coagulation Profile: Recent Labs  Lab 04/24/23 1656  INR 1.1    Radiological Exams on Admission: MR BRAIN WO CONTRAST  Result Date: 04/24/2023 CLINICAL DATA:  Transient ischemic attack, seizure EXAM: MRI HEAD WITHOUT CONTRAST TECHNIQUE: Multiplanar, multiecho pulse sequences of the brain and surrounding structures were obtained without intravenous contrast. COMPARISON:  No prior MRI available, correlation is made with 04/24/2023 CT head FINDINGS: Evaluation is limited as the patient refused to complete the study. The axial T1 and coronal T2 sequences could not be obtained. Evaluation is also  limited by motion. Brain: No restricted diffusion to suggest acute or subacute infarct. No acute hemorrhage, mass, mass effect, or midline shift. No hydrocephalus or extra-axial collection. Partial empty sella. Normal craniocervical junction. No hemosiderin deposition to suggest remote hemorrhage. T2 hyperintense signal in the periventricular white matter, likely the sequela of chronic small vessel ischemic disease. Vascular: Unable to evaluate. Skull and upper  cervical spine: Grossly normal marrow signal. Sinuses/Orbits: Clear paranasal sinuses. No acute finding in the orbits. Status post bilateral lens replacements. Other: The mastoid air cells are well aerated. IMPRESSION: Evaluation is limited as the patient refused to complete the study, as well as by motion. Within these limitations, there is no acute intracranial process. No evidence of acute or subacute infarct. Electronically Signed   By: Wiliam Ke M.D.   On: 04/24/2023 20:03   DG Chest Port 1 View  Result Date: 04/24/2023 CLINICAL DATA:  Shortness of breath. EXAM: PORTABLE CHEST 1 VIEW COMPARISON:  12/10/2022. FINDINGS: Bilateral lung fields are clear. There are probable atelectatic changes in the left retrocardiac region. Apparent faint opacification of left lung base is likely secondary to overlying soft tissue/breast shadow. Bilateral costophrenic angles are clear. Normal cardio-mediastinal silhouette. No acute osseous abnormalities. The soft tissues are within normal limits. IMPRESSION: 1. Probable left retrocardiac atelectasis. Electronically Signed   By: Jules Schick M.D.   On: 04/24/2023 18:44   CT HEAD CODE STROKE WO CONTRAST  Result Date: 04/24/2023 CLINICAL DATA:  Code stroke.  Neuro deficit, acute, stroke suspected EXAM: CT HEAD WITHOUT CONTRAST TECHNIQUE: Contiguous axial images were obtained from the base of the skull through the vertex without intravenous contrast. RADIATION DOSE REDUCTION: This exam was performed according to  the departmental dose-optimization program which includes automated exposure control, adjustment of the mA and/or kV according to patient size and/or use of iterative reconstruction technique. COMPARISON:  CT head 02/07/2022. FINDINGS: Brain: No evidence of acute infarction, hemorrhage, hydrocephalus, extra-axial collection or mass lesion/mass effect. Patchy white matter hypodensities, nonspecific but compatible with chronic microvascular ischemic change. Vascular: No hyperdense vessel. Skull: No acute fracture. Sinuses/Orbits: No acute finding. Other: No mastoid effusions. ASPECTS [facility] Stroke Program Early CT Score) total score (0-10 with 10 being normal): 10. IMPRESSION: 1. No evidence of acute intracranial abnormality. 2. ASPECTS is 10. Code stroke imaging results were communicated on 04/24/2023 at 5:20 pm to provider [email or web address] via telephone, who verbally acknowledged these results. Electronically Signed   By: Feliberto Harts M.D.   On: 04/24/2023 17:21    EKG: Independently reviewed.  Sinus rhythm, rate 67, QTc 480.  No significant change from prior.  Assessment/Plan Principal Problem:   Speech abnormality Active Problems:   Abdominal pain   Dyspnea   Depression   Essential hypertension   Irritable bowel syndrome   DIARRHEA, RECURRENT   Lymphocytic colitis   Assessment and Plan: * Speech abnormality Transient episode of aphasia, questionable slurred speech, left facial droop, numbness and tingling to extremities, EDP reports left-sided weakness on exam. But on my evaluation, 5 of 5 strength to all extremities without focal deficits.  Speech intact. -Code stroke called, patient back to baseline. -Evaluated by teleneurologist, recommended MRI  W Wo, stroke workup only if positive MRI. - Unfortunately only MRI without contrast could be too done, as patient did not tolerate MRI-study limited by motion, no acute intracranial process -EEG -UA pending, she reports persistent intermittent  dysuria, and reports completing 3 courses of antibiotics in the past 3 months. -She appears dehydrated, hydrate - Inpatient neurology consult  Abdominal pain Chronic abdominal pain over the past several months, with intermittent diarrhea.  No change in bowel habits at this time.  She reports 3 episodes of vomiting today.  History of microscopic colitis, IBS.  No recent abdominal imaging. -Obtain CT abdomen and pelvis with contrast - N/s 100cc/hr x 20hrs -Clear liquid diet for now - PRN Zofran  Dyspnea Chronic unchanged dyspnea over the past year.  Has seen pulmonology, cardiology, and now following with ENT, diagnosed with structural defect, and is to follow-up for ??  Procedure.  Not hypoxic.  Chest x-ray shows probable left retrocardiac atelectasis  Essential hypertension Stable. - Hold losartan for contrast exposure - Hold HCTZ to allow for hydration - PRN hydralazine for systolic > 170  Depression Depression and likely uncontrolled anxiety with panic attacks. -Pending med rec, resume Celexa,   DVT prophylaxis: Lovenox Code Status: FULL code-confirmed with patient at bedside. Family Communication: None currently at bedside, Son previously present. Disposition Plan: ~ 1-2 days Consults called: Neurology Admission status:  Obs Tele   Author: Onnie Boer, MD 04/24/2023 9:03 PM  For on call review [email or web address].

## 2023-04-24 NOTE — ED Notes (Addendum)
Pt transported to and from CT. Symptoms seem to come and go which son agreed. Son stated on the way here pt was unable to speak for several minutes.

## 2023-04-24 NOTE — Progress Notes (Addendum)
Code stroke cart activated at 1659. Per bedside RN, pt had onset not being able to speak at 1645 that last a few minutes.  Tele neuro paged at 1700. Dr Wilford Corner on screen at 1702. Pt to CT at 1710. Ricci Barker, Tele Stroke RN

## 2023-04-24 NOTE — ED Notes (Signed)
Son called a [employer] fundraiser to room stating mother could not speak again. Pt was talking when I arrived to room. EDP was at bedside.

## 2023-04-24 NOTE — ED Notes (Signed)
EPD at bedside

## 2023-04-24 NOTE — Assessment & Plan Note (Addendum)
Depression and likely uncontrolled anxiety with panic attacks. -Pending med rec, resume Celexa,

## 2023-04-24 NOTE — ED Notes (Signed)
Pt transported to MRI 

## 2023-04-24 NOTE — ED Notes (Signed)
ED Provider at bedside. 

## 2023-04-24 NOTE — Assessment & Plan Note (Addendum)
Stable. - Hold losartan for contrast exposure - Hold HCTZ to allow for hydration - PRN hydralazine for systolic > 170

## 2023-04-24 NOTE — ED Notes (Signed)
Pt appears to be tachypnic during conversations. Short of breath while sitting. Pt is anxious and fidgety. Pt was encouraged to take slow deep breaths.   Pt states, "I've been having trouble breathing. My nose is deviated. My hands are numb and tingling. I can't sit still"

## 2023-04-24 NOTE — Assessment & Plan Note (Signed)
Chronic unchanged dyspnea over the past year.  Has seen pulmonology, cardiology, and now following with ENT, diagnosed with structural defect, and is to follow-up for ??  Procedure.  Not hypoxic.  Chest x-ray shows probable left retrocardiac atelectasis

## 2023-04-24 NOTE — ED Provider Notes (Signed)
[place] EMERGENCY DEPARTMENT AT [facility] Provider Note   CSN: [id number] Arrival date & time: 04/24/23  1512     History  Chief Complaint  Patient presents with   Shaking    Marie [name] is a 82 y.o. female.  HPI    82 year old female comes in with chief complaint of shortness of breath, abdominal pain and shaking.  Patient has history of hypertension, hyperlipidemia, IBS, lymphocytic colitis, diverticulitis and frequent UTIs.  According to the son, patient has been suffering with shortness of breath for a long time.  She has seen a pulmonologist and a cardiologist and they both cleared the patient.  Over the last 2 days her shortness of breath has worsened.  She called him about the shortness of breath earlier today.  They were going to nearby hospital when patient started complaining of numbness in both of her hands -and so they decided to come to the ER.  Patient was also having some shaking of her hands.  There was no unresponsiveness or loss of consciousness.  Patient's shortness of breath is at rest and also with exertion.  She denies any associated chest pain.  She is having abdominal pain.  She suffers with chronic diarrhea.  Abdominal pain is generalized in the lower quadrant.  Patient denies any history of strokes.  After we are initiated patient's workup, I received a call from the nursing staff to assess the patient again.  They found patient unable to speak, facial droop and having left-sided numbness of the upper extremity.  Upon my evaluation, patient was able to speak, she did not have a clear facial droop, but she had left-sided weakness and reported left upper extremity numbness.  Code stroke was activated at that time.  Home Medications Prior to Admission medications   Medication Sig Start Date End Date Taking? Authorizing Provider  Ascorbic Acid (VITAMIN C PO) Take 1 tablet by mouth every morning.    [provider]  CALCIUM PO Take  1 tablet by mouth every morning.    [provider]  citalopram (CELEXA) 40 MG tablet Take 40 mg by mouth daily. 12/19/20   [provider]  esomeprazole (NEXIUM) 40 MG capsule Take 40 mg by mouth daily.     [provider]  hydrochlorothiazide (HYDRODIURIL) 12.5 MG tablet Take 12.5 mg by mouth daily. 12/19/20   [provider]  losartan (COZAAR) 50 MG tablet Take 50 mg by mouth every morning.    [provider]  Multiple Vitamins-Minerals (CENTRUM SILVER ULTRA WOMENS PO) Take 1 tablet by mouth every morning.    [provider]  Omega-3 Fatty Acids (FISH OIL) 1000 MG CAPS Take 2 capsules by mouth daily.    [provider]  traMADol (ULTRAM) 50 MG tablet Take 50 mg by mouth every 6 (six) hours as needed. For pain    [provider]  vitamin B-12 (CYANOCOBALAMIN) 1000 MCG tablet Take 1,000 mcg by mouth daily.    [provider]      Allergies    Codeine and Promethazine hcl    Review of Systems   Review of Systems  All other systems reviewed and are negative.   Physical Exam Updated Vital Signs BP (!) 169/93   Pulse 71   Temp 97.8 F (36.6 C) (Oral)   Resp 16   Ht 5\' 3"  (1.6 m)   Wt 65.3 kg   SpO2 100%   BMI 25.51 kg/m  Physical Exam Vitals and  nursing note reviewed.  Constitutional:      Appearance: She is well-developed.  HENT:     Head: Atraumatic.  Eyes:     Extraocular Movements: Extraocular movements intact.     Pupils: Pupils are equal, round, and reactive to light.     Comments: No nystagmus, visual fields are intact  Cardiovascular:     Rate and Rhythm: Normal rate.  Pulmonary:     Effort: Pulmonary effort is normal.  Musculoskeletal:     Cervical back: Normal range of motion and neck supple.  Skin:    General: Skin is warm and dry.  Neurological:     Mental Status: She is alert and oriented to person, place, and time.     Cranial Nerves: No cranial nerve deficit.     Sensory:  Sensory deficit present.     Motor: Weakness present.     Coordination: Coordination normal.     Comments: Patient has left upper extremity numbness and left upper extremity drift     ED Results / Procedures / Treatments   Labs (all labs ordered are listed, but only abnormal results are displayed) Labs Reviewed  BASIC METABOLIC PANEL - Abnormal; Notable for the following components:      Result Value   Sodium 133 (*)    Creatinine, Ser 1.12 (*)    GFR, Estimated 50 (*)    All other components within normal limits  CBC WITH DIFFERENTIAL/PLATELET - Abnormal; Notable for the following components:   RBC 3.62 (*)    Hemoglobin 10.5 (*)    HCT 32.5 (*)    All other components within normal limits  BRAIN NATRIURETIC PEPTIDE  PROTIME-INR  APTT  ETHANOL  URINALYSIS, W/ REFLEX TO CULTURE (INFECTION SUSPECTED)  RAPID URINE DRUG SCREEN, HOSP PERFORMED  I-STAT CHEM 8, ED  TROPONIN I (HIGH SENSITIVITY)  TROPONIN I (HIGH SENSITIVITY)    EKG EKG Interpretation Date/Time:  Wednesday April 24 2023 15:52:10 EDT Ventricular Rate:  67 PR Interval:  124 QRS Duration:  118 QT Interval:  454 QTC Calculation: 480 R Axis:   62  Text Interpretation: Sinus rhythm Nonspecific intraventricular conduction delay Repol abnrm suggests ischemia, diffuse leads ST elevation, consider inferior injury No acute changes No significant change since last tracing Confirmed by Derwood Kaplan [phone]) on 04/24/2023 5:31:16 PM  Radiology CT HEAD CODE STROKE WO CONTRAST  Result Date: 04/24/2023 CLINICAL DATA:  Code stroke.  Neuro deficit, acute, stroke suspected EXAM: CT HEAD WITHOUT CONTRAST TECHNIQUE: Contiguous axial images were obtained from the base of the skull through the vertex without intravenous contrast. RADIATION DOSE REDUCTION: This exam was performed according to the departmental dose-optimization program which includes automated exposure control, adjustment of the mA and/or kV according to patient  size and/or use of iterative reconstruction technique. COMPARISON:  CT head 02/07/2022. FINDINGS: Brain: No evidence of acute infarction, hemorrhage, hydrocephalus, extra-axial collection or mass lesion/mass effect. Patchy white matter hypodensities, nonspecific but compatible with chronic microvascular ischemic change. Vascular: No hyperdense vessel. Skull: No acute fracture. Sinuses/Orbits: No acute finding. Other: No mastoid effusions. ASPECTS [facility] Stroke Program Early CT Score) total score (0-10 with 10 being normal): 10. IMPRESSION: 1. No evidence of acute intracranial abnormality. 2. ASPECTS is 10. Code stroke imaging results were communicated on 04/24/2023 at 5:20 pm to provider Thelbert Gartin via telephone, who verbally acknowledged these results. Electronically Signed   By: Feliberto Harts M.D.   On: 04/24/2023 17:21    Procedures .Critical Care  Performed by: Derwood Kaplan, MD  Authorized by: Derwood Kaplan, MD   Critical care provider statement:    Critical care time (minutes):  52   Critical care was necessary to treat or prevent imminent or life-threatening deterioration of the following conditions:  CNS failure or compromise   Critical care was time spent personally by me on the following activities:  Development of treatment plan with patient or surrogate, discussions with consultants, evaluation of patient's response to treatment, examination of patient, ordering and review of laboratory studies, ordering and review of radiographic studies, ordering and performing treatments and interventions, pulse oximetry, re-evaluation of patient's condition and review of old charts   Care discussed with: admitting provider       Medications Ordered in ED Medications - No data to display  ED Course/ Medical Decision Making/ A&P                                 Medical Decision Making Amount and/or Complexity of Data Reviewed Labs: ordered. Radiology: ordered.   This patient presents to  the ED with chief complaint(s) of shortness of breath -but then also adds that she is having abdominal discomfort.  Son also states that patient scared her with complains of bilateral upper extremity numbness and shaking as they were heading to the hospital.  She has pertinent past medical history of hypertension, hyperlipidemia, colitis.The complaint involves an extensive differential diagnosis and also carries with it a high risk of complications and morbidity.    Initial neuroexam was reassuring.  Patient was able to demonstrate good strength in upper extremity, she had no dysmetria.  After initial assessment, differential diagnosis essentially included numbness and tingling because of hyperventilation, severe electrolyte abnormality, pleural effusion, pulmonary edema, CHF, anxiety, diverticulitis, colitis.  The initial plan was to get basic labs and likely CT abdomen pelvis with contrast.  Thereafter, patient had new neurologic changes.  Although during my assessment patient was able to speak and had no facial droop, she had left upper extremity weakness and numbness.  Code stroke was activated at that time due to waxing and waning symptoms.   Additional history obtained: Additional history obtained from family Records reviewed previous admission documents.  She had a CT PE done about a year ago which was negative.  Independent labs interpretation:  The following labs were independently interpreted: CBC is reassuring.  Sodium is only slightly low at 133.  Hemoglobin is normal.  Independent visualization and interpretation of imaging: - I independently visualized the following imaging with scope of interpretation limited to determining acute life threatening conditions related to emergency care: CT brain, which revealed negative for brain bleed.  Treatment and Reassessment: @6 :58 -I spoke with the patient's family again.  They indicated that as patient was being driven to the ER for complaints  of worsening shortness of breath and abdominal pain/back pain/UTI -she suddenly started making unusual sounds and had her left upper extremity contracted and hitting her face.  Patient appeared " out of it".  Her face was distorted, like it was in the ER when the repeat episode occurred.  It appears to me that possibly a seizure occurred. I did discuss this finding with Dr. Jerrell Belfast.  MRI brain with and without has been ordered now. Plan is to admit the patient.   Final Clinical Impression(s) / ED Diagnoses Final diagnoses:  Seizure-like activity (HCC)  TIA (transient ischemic attack)    Rx / DC Orders ED Discharge Orders  None         Derwood Kaplan, MD 04/24/23 [phone]

## 2023-04-25 ENCOUNTER — Telehealth: Payer: Self-pay | Admitting: Gastroenterology

## 2023-04-25 ENCOUNTER — Other Ambulatory Visit (HOSPITAL_COMMUNITY): Payer: Self-pay | Admitting: *Deleted

## 2023-04-25 ENCOUNTER — Observation Stay (HOSPITAL_COMMUNITY): Payer: Medicare (Managed Care)

## 2023-04-25 ENCOUNTER — Observation Stay (HOSPITAL_COMMUNITY)
Admit: 2023-04-25 | Discharge: 2023-04-25 | Disposition: A | Discharge status: Home / Self Care | Payer: Medicare (Managed Care) | Attending: Internal Medicine | Admitting: Internal Medicine

## 2023-04-25 DIAGNOSIS — K580 Irritable bowel syndrome with diarrhea: Secondary | ICD-10-CM | POA: Diagnosis not present

## 2023-04-25 DIAGNOSIS — R4702 Dysphasia: Secondary | ICD-10-CM

## 2023-04-25 DIAGNOSIS — K52832 Lymphocytic colitis: Secondary | ICD-10-CM | POA: Diagnosis not present

## 2023-04-25 DIAGNOSIS — R197 Diarrhea, unspecified: Secondary | ICD-10-CM

## 2023-04-25 DIAGNOSIS — R209 Unspecified disturbances of skin sensation: Secondary | ICD-10-CM

## 2023-04-25 DIAGNOSIS — I10 Essential (primary) hypertension: Secondary | ICD-10-CM | POA: Diagnosis not present

## 2023-04-25 DIAGNOSIS — R569 Unspecified convulsions: Secondary | ICD-10-CM | POA: Diagnosis not present

## 2023-04-25 LAB — LIPID PANEL
Cholesterol: 257 mg/dL — ABNORMAL HIGH (ref 0–200)
HDL: 60 mg/dL (ref >40)
LDL Cholesterol: 183 mg/dL — ABNORMAL HIGH (ref 0–99)
Total CHOL/HDL Ratio: 4.3 RATIO
Triglycerides: 68 mg/dL (ref <150)
VLDL: 14 mg/dL (ref 0–40)

## 2023-04-25 LAB — TSH: TSH: 3.211 u[IU]/mL (ref 0.350–4.500)

## 2023-04-25 LAB — T4, FREE: Free T4: 1.2 ng/dL — ABNORMAL HIGH (ref 0.61–1.12)

## 2023-04-25 LAB — BASIC METABOLIC PANEL
Anion gap: 13 (ref 5–15)
BUN: 17 mg/dL (ref 8–23)
CO2: 21 mmol/L — ABNORMAL LOW (ref 22–32)
Calcium: 9 mg/dL (ref 8.9–10.3)
Chloride: 102 mmol/L (ref 98–111)
Creatinine, Ser: 0.92 mg/dL (ref 0.44–1.00)
GFR, Estimated: >60 mL/min (ref >60)
Glucose, Bld: 138 mg/dL — ABNORMAL HIGH (ref 70–99)
Potassium: 3.2 mmol/L — ABNORMAL LOW (ref 3.5–5.1)
Sodium: 136 mmol/L (ref 135–145)

## 2023-04-25 LAB — MAGNESIUM: Magnesium: 1.9 mg/dL (ref 1.7–2.4)

## 2023-04-25 LAB — HEMOGLOBIN A1C
Hgb A1c MFr Bld: 6.1 % — ABNORMAL HIGH (ref 4.8–5.6)
Mean Plasma Glucose: 128.37 mg/dL

## 2023-04-25 LAB — VITAMIN B12: Vitamin B-12: 1146 pg/mL — ABNORMAL HIGH (ref 180–914)

## 2023-04-25 LAB — CK: Total CK: 323 U/L — ABNORMAL HIGH (ref 38–234)

## 2023-04-25 LAB — FOLATE: Folate: >40 ng/mL (ref >5.9)

## 2023-04-25 MED ORDER — HYOSCYAMINE SULFATE 0.125 MG/ML PO SOLN
0.2500 mg | Freq: Three times a day (TID) | ORAL | Status: DC
  Administered 2023-04-25: 0.25 mg via ORAL
  Filled 2023-04-25 (×3): qty 2

## 2023-04-25 MED ORDER — PANTOPRAZOLE SODIUM 40 MG PO TBEC
40.0000 mg | DELAYED_RELEASE_TABLET | Freq: Every day | ORAL | Status: DC
  Administered 2023-04-25: 40 mg via ORAL
  Filled 2023-04-25: qty 1

## 2023-04-25 MED ORDER — CLONAZEPAM 1 MG PO TABS: 1.0000 mg | ORAL_TABLET | Freq: Every day | ORAL | 0 refills | Status: DC | PRN

## 2023-04-25 MED ORDER — HYOSCYAMINE SULFATE 0.125 MG/ML PO SOLN: 0.2500 mg | Freq: Three times a day (TID) | ORAL | 1 refills | Status: DC

## 2023-04-25 MED ORDER — POTASSIUM CHLORIDE CRYS ER 20 MEQ PO TBCR
40.0000 meq | EXTENDED_RELEASE_TABLET | Freq: Once | ORAL | Status: AC
  Administered 2023-04-25: 40 meq via ORAL
  Filled 2023-04-25: qty 2

## 2023-04-25 NOTE — Plan of Care (Signed)
  Problem: Acute Rehab PT Goals(only PT should resolve) Goal: Pt Will Go Supine/Side To Sit Flowsheets (Taken 04/25/2023 1426) Pt will go Supine/Side to Sit: Independently Goal: Pt Will Transfer Bed To Chair/Chair To Bed Flowsheets (Taken 04/25/2023 1426) Pt will Transfer Bed to Chair/Chair to Bed: Independently Goal: Pt Will Ambulate Flowsheets (Taken 04/25/2023 1426) Pt will Ambulate:  > 125 feet  with rolling walker  with modified independence

## 2023-04-25 NOTE — Progress Notes (Signed)
I connected with  Marie [name] on 04/25/23 by a video enabled telemedicine application and verified that I am speaking with the correct person using two identifiers.   I discussed the limitations of evaluation and management by telemedicine. The patient expressed understanding and agreed to proceed.  Location of patient: [facility] Location of physician: [facility]  Subjective: No acute events overnight.  Patient states she had 2 episodes yesterday, no further episodes since then.  Per son at bedside, she had "controlled" left side of the face as well as some jerking of left upper extremity, was not able to talk lasting for few minutes.  Then had another episode while in the emergency room which appeared similar.  In between episodes she was moving everything as if she was restless.  Patient states she was feeling anxious before coming to the emergency room because she has been having some abdominal pain and trouble breathing but denies any previous history of panic attacks.  States she felt like the left side of her cheek and lips were numb, had numbness in bilateral arms involving up to the elbows and trouble speaking.  Denies any family history of seizures or epilepsy.  Per son at bedside, patient's husband did have brain cancer and had seizures secondary to that.  Denies any recent medication changes, head injury with loss of consciousness   ROS: negative except above  Examination  Vital signs in last 24 hours: Temp:  [97.6 F (36.4 C)-99 F (37.2 C)] 98.5 F (36.9 C) (09/19 0540) Pulse Rate:  [65-85] 79 (09/19 0540) Resp:  [12-24] 18 (09/19 0215) BP: (130-193)/(63-97) 130/86 (09/19 0540) SpO2:  [93 %-100 %] 98 % (09/19 0827) Weight:  [65.3 kg] 65.3 kg (09/18 1518)  General: lying in bed, NAD Neuro: MS: Alert, oriented, follows commands CN: pupils equal and reactive,  EOMI, face symmetric (does appear to have some flattening of right nasolabial fold but per patient  and son, this is baseline), tongue midline, normal sensation over face, Motor: Antigravity strength in all extremities without drift Coordination: normal Gait: not tested  Basic Metabolic Panel: Recent Labs  Lab 04/24/23 1656 04/25/23 0935  NA 133* 136  K 3.6 3.2*  CL 99 102  CO2 23 21*  GLUCOSE 98 138*  BUN 23 17  CREATININE 1.12* 0.92  CALCIUM 9.0 9.0  MG  --  1.9    CBC: Recent Labs  Lab 04/24/23 1656  WBC 5.4  NEUTROABS 2.9  HGB 10.5*  HCT 32.5*  MCV 89.8  PLT 206     Coagulation Studies: Recent Labs    04/24/23 1656  LABPROT 14.0  INR 1.1    Imaging personally reviewed MRI brain without contrast 04/21/2023: Limited exam, no acute infarct.  ASSESSMENT AND PLAN: 82 year old female with 2 episodes of transient aphasia, numbness on left side of face, numbness in bilateral arms up to the elbows as well as some jerking movements.  Transient aphasia, numbness and jerking Rhabdomyolysis -MRI brain within normal limits -Differentials include seizure versus panic attack.  Face was involved therefore less likely radiculopathy.  CK does appear to be elevated which increases concern for seizure.   Recommendations -EEG ordered and pending. -If EEG is within normal limits, low suspicion for focal seizure.  Therefore would recommend clonazepam 1 mg (5 tablets) to be used if patient has similar episode again lasting for more than 2 minutes -Also discussed with son that if episodes recur, once patient is safe, to please try and  take a video of the episode -Discussed seizure precautions including do not drive for 6 months -Discussed plan with patient and son at bedside as well as Dr. Arbutus Leas via secure chat  I have spent a total of  36  minutes with the patient reviewing hospital notes,  test results, labs and examining the patient as well as establishing an assessment and plan that was discussed personally with the patient.  > 50% of time was spent in direct patient care.      Lindie Spruce Epilepsy [facility] For questions after 5pm please refer to AMION to reach the Neurologist on call

## 2023-04-25 NOTE — Consult Note (Signed)
Gastroenterology Consult   Referring Provider: No ref. provider found Primary Care Physician:  [facility] Primary Gastroenterologist:  Dr. Jena Gauss  Patient ID: Marie [name]; [id number]; 1941-03-29   Admit date: 04/24/2023  LOS: 0 days   Date of Consultation: 04/25/2023  Reason for Consultation:  abdominal pain, diarrhea, IBS-D    History of Present Illness   Marie [name] is a 82 y.o. 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   Patient states she has not felt well in several weeks.  She has been dealing with a lot of issues including recurrent UTIs over the past few months, arthritis, breathing difficulties.  For the past couple of weeks she has been feeling weak.  Her and her son both got COVID about 3 weeks ago, she continues to have a cough.  She notes has been harder to breathe.  She gives out easily doing ADLs.  Chronically she has history of microscopic colitis and will have bouts of diarrhea off and on.  When she was last seen in the office in April however she was having more constipation and was given Linzess samples.  Patient states for the most part recently her bowel movements have been regular.  Her son states whenever she eats out she ends to have more abdominal pain and diarrhea and points out that she tends to eat very fatty foods at these times.  She seems to do better when she is eating at home.  On good days she typically has about 1 or 2 stools.  Other times she has more but usually less than 5 daily.  She denies any melena or rectal bleeding.  She complains of intermittent left lower quadrant pain, described as dull.  Seems to be unrelated to meals or bowel movements, can be worse with movement.  She has had persistent pain now for about a week and she wondered if she had a UTI again, she is wondered if  it is coming from her hip.  Her appetite has been very poor.  Heartburn has been well-controlled. Denies significant weight loss.  While son was driving the patient to the ED, en route she started having shaking to her bilateral upper and lower extremities, son reported left facial droop.  Patient remembers the symptoms.  Resolved prior to arrival to the ED.  In the ED she had 2 episodes where she was unable to speak, this was noted by ED staff and was noted to be transient.  ED provider also noted weakness to her left side.  She was evaluated by the teleneurologist, recommended MRI with and without contrast, unfortunately only MRI without contrast could be done as patient did not tolerate the MRI.  Patient states she developed 3 episodes of emesis after the MRI in the setting of dizziness. No hematemesis.   In the ED: Sodium 133, creatinine 1.12, hemoglobin 10.5 (down from 11.5 in May 2024), urinalysis with 20 ketones, 100 protein, few bacteria, urine drug screen negative, ethanol level negative.  MRI head without contrast limited as patient could not complete study as well as by motion.  Within these limitations no acute intracranial process, no evidence of acute or subacute infarct.  CT head without contrast with no evidence of acute intracranial abnormality.  Today her CK is 323, potassium 3.2, creatinine 0.92, magnesium 1.9.  CT abdomen pelvis with contrast yesterday showed  right sided spigelian hernia containing a few loops of small bowel without obstructive change, small fat-containing umbilical hernia, large sliding-type hiatal hernia.  Colonoscopy June 2018: -Diverticulosis in the sigmoid colon -Segmental biopsies taken, microcytic colitis (lymphocytic colitis)   Prior to Admission medications   Medication Sig Start Date End Date Taking? Authorizing Provider  Ascorbic Acid (VITAMIN C PO) Take 1 tablet by mouth every morning.   Yes [provider]  budesonide (PULMICORT) 0.25 MG/2ML  nebulizer solution Take 2 mLs by nebulization daily. 01/03/23  Yes [provider]  CALCIUM PO Take 1 tablet by mouth every morning.   Yes [provider]  cefUROXime (CEFTIN) 250 MG tablet Take 250 mg by mouth 2 (two) times daily with a meal. 04/22/23  Yes [provider]  citalopram (CELEXA) 40 MG tablet Take 40 mg by mouth daily. 12/19/20  Yes [provider]  esomeprazole (NEXIUM) 40 MG capsule Take 40 mg by mouth daily.    Yes [provider]  hydrochlorothiazide (HYDRODIURIL) 12.5 MG tablet Take 12.5 mg by mouth daily. 12/19/20  Yes [provider]  ipratropium (ATROVENT) 0.02 % nebulizer solution Take 2.5 mLs by nebulization every 6 (six) hours as needed for wheezing or shortness of breath. 02/05/23  Yes [provider]  losartan (COZAAR) 50 MG tablet Take 50 mg by mouth every morning.   Yes [provider]  Multiple Vitamins-Minerals (CENTRUM SILVER ULTRA WOMENS PO) Take 1 tablet by mouth every morning.   Yes [provider]  Omega-3 Fatty Acids (FISH OIL) 1000 MG CAPS Take 2 capsules by mouth daily.   Yes [provider]  traMADol (ULTRAM) 50 MG tablet Take 50 mg by mouth every 6 (six) hours as needed. For pain   Yes [provider]  vitamin B-12 (CYANOCOBALAMIN) 1000 MCG tablet Take 1,000 mcg by mouth daily.   Yes [provider]  ipratropium-albuterol (DUONEB) 0.5-2.5 (3) MG/3ML SOLN Take 3 mLs by nebulization 3 (three) times daily. Patient not taking: Reported on 04/25/2023 01/03/23   [provider]  losartan (COZAAR) 100 MG tablet Take 100 mg by mouth daily. Patient not taking: Reported on 04/25/2023    [provider]    Current Facility-Administered Medications  Medication Dose Route Frequency Provider Last Rate Last Admin   0.9 %  sodium chloride infusion   Intravenous Continuous Emokpae, Ejiroghene E, MD 20 mL/hr at 04/25/23 0754 Infusion Verify at 04/25/23 0754    acetaminophen (TYLENOL) tablet 650 mg  650 mg Oral Q6H PRN Emokpae, Ejiroghene E, MD       Or   acetaminophen (TYLENOL) suppository 650 mg  650 mg Rectal Q6H PRN Emokpae, Ejiroghene E, MD       budesonide (PULMICORT) nebulizer solution 0.25 mg  2 mL Nebulization Daily Emokpae, Ejiroghene E, MD   0.25 mg at 04/25/23 0827   enoxaparin (LOVENOX) injection 40 mg  40 mg Subcutaneous QHS Emokpae, Ejiroghene E, MD   40 mg at 04/24/23 2218   hydrALAZINE (APRESOLINE) injection 5 mg  5 mg Intravenous Q4H PRN Emokpae, Ejiroghene E, MD   5 mg at 04/24/23 2218   ipratropium (ATROVENT) nebulizer solution 0.5 mg  2.5 mL Nebulization Q6H PRN Emokpae, Ejiroghene E, MD       ipratropium-albuterol (DUONEB) 0.5-2.5 (3) MG/3ML nebulizer solution 3 mL  3 mL Nebulization TID Emokpae, Ejiroghene E, MD   3 mL at 04/25/23 0825   ondansetron (ZOFRAN) tablet 4 mg  4 mg Oral Q6H PRN Emokpae, Ejiroghene E,  MD       Or   ondansetron (ZOFRAN) injection 4 mg  4 mg Intravenous Q6H PRN Emokpae, Ejiroghene E, MD       polyethylene glycol (MIRALAX / GLYCOLAX) packet 17 g  17 g Oral Daily PRN Emokpae, Ejiroghene E, MD        Allergies as of 04/24/2023 - Review Complete 04/24/2023  Allergen Reaction Noted   Codeine Hives and Swelling 12/27/2015   Promethazine hcl  10/09/2022    Past Medical History:  Diagnosis Date   Anemia    Collagenous colitis    colonoscopy 2009   Depression    Fibromyalgia    GERD (gastroesophageal reflux disease)    Hiatal hernia 2008   small   Hypercholesterolemia    Hypertension    IBS (irritable bowel syndrome)    Lymphocytic colitis    colonoscopy 2007, TCS 2013   S/P endoscopy Sept 2008   small hh, s/p 22 French dilator    Past Surgical History:  Procedure Laterality Date   ABDOMINAL HYSTERECTOMY     BIOPSY  01/09/2017   Procedure: BIOPSY;  Surgeon: Corbin Ade, MD;  Location: [facility];  Service: Endoscopy;;  colon   CATARACT EXTRACTION Bilateral 2021   COLONOSCOPY   04/13/2008   Dr.Rourk- anal papilla and internal hemorrhage otherwise normal /left side diverticula   COLONOSCOPY  09/20/2011   Dr.Rourk- Adequate preparation. Internal hemorrhoids; otherwise normal/ Left-sided diverticula, biopsies positive for lymphocytic colitis   COLONOSCOPY N/A 01/09/2017   Procedure: COLONOSCOPY;  Surgeon: Corbin Ade, MD;  Location: [facility];  Service: Endoscopy;  Laterality: N/A;  7:30am   ESOPHAGOGASTRODUODENOSCOPY  04/22/2007   Dr.Rourk- normal esophagus, small hiatal hernia o/w normal stomach, D1 and D2 s/p passage of a 56-French maloney dilator   fistula of colon     FLEXIBLE SIGMOIDOSCOPY  05/29/2006   Dr.Rehman- No evidence of pseudomembranous or acute colitis.   FOOT SURGERY     KNEE SURGERY     NOSE SURGERY     VEIN SURGERY Bilateral 2021    Family History  Problem Relation Age of Onset   Colon cancer Maternal Uncle    Colon cancer Maternal Grandmother     Social History   Socioeconomic History   Marital status: Widowed    Spouse name: Not on file   Number of children: Not on file   Years of education: Not on file   Highest education level: Not on file  Occupational History   Not on file  Tobacco Use   Smoking status: Never   Smokeless tobacco: Never  Vaping Use   Vaping status: Never Used  Substance and Sexual Activity   Alcohol use: No   Drug use: No   Sexual activity: Not Currently  Other Topics Concern   Not on file  Social History Narrative   Not on file   Social Determinants of Health   Financial Resource Strain: Not on file  Food Insecurity: No Food Insecurity (04/24/2023)   Hunger Vital Sign    Worried About Running Out of Food in the Last Year: Never true    Ran Out of Food in the Last Year: Never true  Transportation Needs: No Transportation Needs (04/24/2023)   PRAPARE - Administrator, Civil Service (Medical): No    Lack of Transportation (Non-Medical): No  Physical Activity: Not on file  Stress: Not  on file  Social Connections: Not on file  Intimate Partner Violence: Not At  Risk (04/24/2023)   Humiliation, Afraid, Rape, and Kick questionnaire    Fear of Current or Ex-Partner: No    Emotionally Abused: No    Physically Abused: No    Sexually Abused: No     Review of System:   General: Negative for anorexia, weight loss, fever, chills, fatigue,+ weakness. Eyes: Negative for vision changes.  ENT: Negative for hoarseness, difficulty swallowing. "Nose does not function" due to collapse needing surgery, has to breathe out of mouth. CV: Negative for chest pain, angina, palpitations, dyspnea on exertion, peripheral edema.  Respiratory: Negative for dyspnea at rest, dyspnea on exertion, cough, sputum, wheezing.  GI: See history of present illness. GU:  Negative for dysuria, hematuria, urinary incontinence, urinary frequency, nocturnal urination.  MS: +arthritis.  Derm: Negative for rash or itching.  Neuro: Negative for abnormal sensation, seizure, frequent headaches, memory loss, confusion. See hpi Psych: Negative for suicidal ideation, hallucinations.  Endo: Negative for unusual weight change.  Heme: Negative for bruising or bleeding. Allergy: Negative for rash or hives.      Physical Examination:   Vital signs in last 24 hours: Temp:  [97.6 F (36.4 C)-99 F (37.2 C)] 98.5 F (36.9 C) (09/19 0540) Pulse Rate:  [65-85] 79 (09/19 0540) Resp:  [12-24] 18 (09/19 0215) BP: (130-193)/(63-97) 130/86 (09/19 0540) SpO2:  [93 %-100 %] 98 % (09/19 0827) Weight:  [65.3 kg] 65.3 kg (09/18 1518) Last BM Date : 04/24/23  General: Well-nourished, well-developed in no acute distress. Son at bedside. Speech with nasal tone. Head: Normocephalic, atraumatic.   Eyes: Conjunctiva pink, no icterus. Mouth: Oropharyngeal mucosa moist and pink  Neck: Supple without thyromegaly, masses, or lymphadenopathy.  Lungs: Clear to auscultation bilaterally.  Heart: Regular rate and rhythm, no murmurs rubs or  gallops.  Abdomen: Bowel sounds are normal,   nondistended, no hepatosplenomegaly or masses, no abdominal bruits or hernia , no rebound or guarding.  Tenderness mostly left lower abdomen, mild Rectal: not performed Extremities: No lower extremity edema, clubbing, deformity.  Neuro: Alert and oriented x 4 , grossly normal neurologically.  Skin: Warm and dry, no rash or jaundice.   Psych: Alert and cooperative, normal mood and affect.        Intake/Output from previous day: 09/18 0701 - 09/19 0700 In: 240 [P.O.:240] Out: 300 [Urine:300] Intake/Output this shift: Total I/O In: 1414.3 [P.O.:500; I.V.:914.3] Out: -   Lab Results:   CBC Recent Labs    04/24/23 1656  WBC 5.4  HGB 10.5*  HCT 32.5*  MCV 89.8  PLT 206   BMET Recent Labs    04/24/23 1656  NA 133*  K 3.6  CL 99  CO2 23  GLUCOSE 98  BUN 23  CREATININE 1.12*  CALCIUM 9.0   LFT No results for input(s): "BILITOT", "BILIDIR", "IBILI", "ALKPHOS", "AST", "ALT", "PROT", "ALBUMIN" in the last 72 hours.  Lipase No results for input(s): "LIPASE" in the last 72 hours.  PT/INR Recent Labs    04/24/23 1656  LABPROT 14.0  INR 1.1     Hepatitis Panel No results for input(s): "HEPBSAG", "HCVAB", "HEPAIGM", "HEPBIGM" in the last 72 hours.   Imaging Studies:   CT ABDOMEN PELVIS W CONTRAST  Result Date: 04/24/2023 CLINICAL DATA:  Diffuse abdominal pain EXAM: CT ABDOMEN AND PELVIS WITH CONTRAST TECHNIQUE: Multidetector CT imaging of the abdomen and pelvis was performed using the standard protocol following bolus administration of intravenous contrast. RADIATION DOSE REDUCTION: This exam was performed according to the departmental dose-optimization program which includes  automated exposure control, adjustment of the mA and/or kV according to patient size and/or use of iterative reconstruction technique. CONTRAST:  80mL OMNIPAQUE IOHEXOL 300 MG/ML  SOLN COMPARISON:  12/04/2021 FINDINGS: Lower chest: Mild emphysematous  changes are noted. No focal infiltrate is seen. Hepatobiliary: No focal liver abnormality is seen. No gallstones, gallbladder wall thickening, or biliary dilatation. Pancreas: Unremarkable. No pancreatic ductal dilatation or surrounding inflammatory changes. Spleen: Normal in size without focal abnormality. Adrenals/Urinary Tract: Adrenal glands are within normal limits. Kidneys demonstrate a normal enhancement pattern. No renal calculi or obstructive changes are noted. Large right renal cyst is noted exophytic from the lower pole measuring up to 5 cm. This is stable in appearance from the prior exam. No follow-up is recommended. No obstructive changes are seen. The bladder is well distended. Stomach/Bowel: Scattered diverticular changes noted without evidence of diverticulitis. No obstructive or inflammatory changes of the colon are seen. A few loops of small bowel are noted within a spigelian hernia anteriorly on the right. No obstructive changes are noted. Small bowel is otherwise unremarkable. Stomach shows a large sliding-type hiatal hernia. Vascular/Lymphatic: Aortic atherosclerosis. No enlarged abdominal or pelvic lymph nodes. Reproductive: Status post hysterectomy. No adnexal masses. Other: Fat containing umbilical hernia is noted anteriorly. No abdominopelvic ascites. Musculoskeletal: Degenerative changes of lumbar spine are noted. No acute bony abnormality is seen. IMPRESSION: Diverticulosis without diverticulitis. Right-sided spigelian hernia containing a few loops of small bowel without obstructive change. Small fat containing umbilical hernia. Electronically Signed   By: Alcide Clever M.D.   On: 04/24/2023 21:14   MR BRAIN WO CONTRAST  Result Date: 04/24/2023 CLINICAL DATA:  Transient ischemic attack, seizure EXAM: MRI HEAD WITHOUT CONTRAST TECHNIQUE: Multiplanar, multiecho pulse sequences of the brain and surrounding structures were obtained without intravenous contrast. COMPARISON:  No prior MRI  available, correlation is made with 04/24/2023 CT head FINDINGS: Evaluation is limited as the patient refused to complete the study. The axial T1 and coronal T2 sequences could not be obtained. Evaluation is also limited by motion. Brain: No restricted diffusion to suggest acute or subacute infarct. No acute hemorrhage, mass, mass effect, or midline shift. No hydrocephalus or extra-axial collection. Partial empty sella. Normal craniocervical junction. No hemosiderin deposition to suggest remote hemorrhage. T2 hyperintense signal in the periventricular white matter, likely the sequela of chronic small vessel ischemic disease. Vascular: Unable to evaluate. Skull and upper cervical spine: Grossly normal marrow signal. Sinuses/Orbits: Clear paranasal sinuses. No acute finding in the orbits. Status post bilateral lens replacements. Other: The mastoid air cells are well aerated. IMPRESSION: Evaluation is limited as the patient refused to complete the study, as well as by motion. Within these limitations, there is no acute intracranial process. No evidence of acute or subacute infarct. Electronically Signed   By: Wiliam Ke M.D.   On: 04/24/2023 20:03   DG Chest Port 1 View  Result Date: 04/24/2023 CLINICAL DATA:  Shortness of breath. EXAM: PORTABLE CHEST 1 VIEW COMPARISON:  12/10/2022. FINDINGS: Bilateral lung fields are clear. There are probable atelectatic changes in the left retrocardiac region. Apparent faint opacification of left lung base is likely secondary to overlying soft tissue/breast shadow. Bilateral costophrenic angles are clear. Normal cardio-mediastinal silhouette. No acute osseous abnormalities. The soft tissues are within normal limits. IMPRESSION: 1. Probable left retrocardiac atelectasis. Electronically Signed   By: Jules Schick M.D.   On: 04/24/2023 18:44   CT HEAD CODE STROKE WO CONTRAST  Result Date: 04/24/2023 CLINICAL DATA:  Code stroke.  Neuro  deficit, acute, stroke suspected EXAM:  CT HEAD WITHOUT CONTRAST TECHNIQUE: Contiguous axial images were obtained from the base of the skull through the vertex without intravenous contrast. RADIATION DOSE REDUCTION: This exam was performed according to the departmental dose-optimization program which includes automated exposure control, adjustment of the mA and/or kV according to patient size and/or use of iterative reconstruction technique. COMPARISON:  CT head 02/07/2022. FINDINGS: Brain: No evidence of acute infarction, hemorrhage, hydrocephalus, extra-axial collection or mass lesion/mass effect. Patchy white matter hypodensities, nonspecific but compatible with chronic microvascular ischemic change. Vascular: No hyperdense vessel. Skull: No acute fracture. Sinuses/Orbits: No acute finding. Other: No mastoid effusions. ASPECTS [facility] Stroke Program Early CT Score) total score (0-10 with 10 being normal): 10. IMPRESSION: 1. No evidence of acute intracranial abnormality. 2. ASPECTS is 10. Code stroke imaging results were communicated on 04/24/2023 at 5:20 pm to provider NANAVATI via telephone, who verbally acknowledged these results. Electronically Signed   By: Feliberto Harts M.D.   On: 04/24/2023 17:21   US RENAL  Result Date: 04/19/2023 CLINICAL DATA:  Left flank pain.  Recurrent UTIs. EXAM: RENAL / URINARY TRACT ULTRASOUND COMPLETE COMPARISON:  CT abdomen and pelvis Dec 04, 2021 FINDINGS: Right Kidney: Renal measurements: 7.6 x 3.9 x 4.1 cm = volume: 63.3 mL. Echogenicity within normal limits. No mass or hydronephrosis visualized. Left Kidney: Renal measurements: 8.6 x 4.2 x 4.2 cm = volume: 79.8 mL. Echogenicity within normal limits. No mass or hydronephrosis visualized. Bladder: Patient recently voided, the bladder is decompressed, unable to visualize. Other: None. IMPRESSION: 1. No acute abnormality identified. 2. Patient recently voided, the bladder is decompressed, unable to visualize. Electronically Signed   By: Sherian Rein M.D.   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 GI consulted for abdominal pain, diarrhea, IBS-D.  Abdominal pain/diarrhea: Patient complains of intermittent left lower quadrant pain for several months, persistent for the past week.  No association with bowel habits.  Bowel movements have been good the last few days, although she does take Linzess on occasion, has samples at home.  She did not report this to me but she reported to the attending that she took Linzess 1 week ago. She reports intermittent UTIs over the past few months and she was concerned that she may have a UTI based on persistent abdominal pain, weakness.  Urinalysis not consistent with UTI.  She had 3 episodes of vomiting in the ED in the setting of dizziness.  This has resolved.  CT without explanation for left lower quadrant pain.  Incidentally found to have umbilical hernia, also with right sided spigelian hernia as well as large sliding hiatal hernia, patient states she was unaware of any of these hernias.  I suspect her abdominal pain may be multifactorial.  Appears mild on exam.  She admits that this is a dull pain.  Worse with movement.  Cannot exclude referred pain from her hip versus IBS, it is not clear that her lower quadrant pain is related to any of the hernias mentioned.    Regarding diarrhea, she has intermittent diarrhea with history of IBS and microscopic colitis, son notes that tends to be worse depending on her diet.  Spells of constipation as well.  Would be reasonable to check stool studies if diarrhea present given history of recurrent UTIs/antibiotic use.  Large  sliding hiatal hernia possibly contributing to her sensation of breathing difficulties.  Remote EGD in 2019 with small hiatal hernia at that time.     Plan:    Stool studies if diarrhea present this admission. Patient desires to advance diet. Resume PPI daily. Trial of Levsin TID initially short term to see if tolerates and helpful. Consider outpatient upper endoscopy to evaluate hiatal hernia.    LOS: 0 days   We would like to thank you for the opportunity to participate in the care of Marie [name].  Leanna Battles. [facility] [phone] 9/19/202411:16 AM

## 2023-04-25 NOTE — Hospital Course (Signed)
82 year old female with a history of microscopic lightest, hypertension, depression, fibromyalgia, IBS-D presenting initially with shortness of breath and abdominal pain and diarrhea. Son was driving patient to the ED when enroute-patient started having shaking and numbness to her bilateral upper and lower extremities; son also reported left facial droop.  Pt states she remembers these episodes, and her extremities felt tight.  The episode lasted about 15 minutes.  It had resolved by the time she was in the emergency department.  However, while she was in the ED, she had another episode of inability to speak and numbness in her upper extremities.  She stated that this episode lasted about 10 minutes.  The patient denies loss of consciousness.  Regarding her abdominal pain, it is primarily in the left lower quadrant.  This appears to be chronic for several years.  She has been followed by GI, Dr. Jena Gauss for IBS-D.  However during her last visit in March 2024, the patient was having constipation and was given a prescription for Linzess.  The patient takes Linzess as needed.  She states she last took it about a week ago.  She denies any hematochezia or melena.  She denies any new medications.  The patient did have an episode of nausea and vomiting on 04/24/2023.  This has improved since admission. Regarding her shortness of breath, she states that it is been present for over a year.  She states she has been following up with cardiology and pulmonology in [place].  She denies any chest pain, cough, hemoptysis.  She describes her dyspnea as primarily dyspnea on exertion.  She states that has not been any significantly worse.  She denies any fevers, chills.  In the ED, the patient was afebrile and hemodynamically stable with oxygen saturation 97% room air.  WBC 5.4, hemoglobin 10.5, platelets 206,000.  Sodium 133, potassium 3.6, bicarbonate 23, serum creatinine 1.12.  Troponin 5>>9.  Urine drug screen is negative.   Alcohol level is negative. MRI of the brain was negative for acute findings although it was limited by patient intolerance.  Chest x-ray showed retrocardiac atelectasis.  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  There is a small umbilical hernia filled with fat.  This is stable right renal cyst.  The patient was started on a clear liquid diet initially.

## 2023-04-25 NOTE — Progress Notes (Signed)
EEG complete - results pending 

## 2023-04-25 NOTE — Evaluation (Signed)
Physical Therapy Evaluation Patient Details Name: Marie [name] MRN: [id number] DOB: 1941-05-31 Today's Date: 04/25/2023  History of Present Illness  Per Marie [name] is a 82 y.o. female with medical history significant for microscopic colitis, hypertension, depression and fibromyalgia.  Patient reports diffuse abdominal pain over the past several months at least, with chronic intermittent diarrhea, on average 0-4 times a day-which she attributes to her microscopic colitis.  She reports 3 episodes of vomiting today only.  No fever no chills.  She also reports difficulty breathing denies chronic over the past year at least, for which she sees cardiology and pulmonology, and also followed up with ENT and was diagnosed with some structural abnormality (?  Deviation of nasal septum ) for which she needs surgical correction.  He tells me she breathes better with her mouth.  Son was driving patient to the ED when enroute-patient started having shaking to her bilateral upper and lower extremities, son also reported left facial droop, tells me she remembers these episodes, and her extremities felt tight.  She tells me resolved on arrival to the ED.  In the ED she had 2 episodes where she was unable to speak, this was noted by ED staff , this was transient, EDP also noted some weakness to her left side  Clinical Impression  Pt able to perform mobility         If plan is discharge home, recommend the following: A little help with walking and/or transfers;Assistance with cooking/housework   Can travel by private vehicle    yes    Equipment Recommendations None recommended by PT  Recommendations for Other Services   none   Functional Status Assessment Patient has had a recent decline in their functional status and demonstrates the ability to make significant improvements in function in a reasonable and predictable amount of time.     Precautions / Restrictions Precautions Precautions:  None Restrictions Weight Bearing Restrictions: No      Mobility  Bed Mobility Overal bed mobility: Modified Independent                  Transfers Overall transfer level: Modified independent Equipment used: Rolling walker (2 wheels)                    Ambulation/Gait Ambulation/Gait assistance: Modified independent (Device/Increase time) Gait Distance (Feet): 80 Feet Assistive device: Rolling walker (2 wheels) Gait Pattern/deviations: Decreased dorsiflexion - right   Gait velocity interpretation: 1.31 - 2.62 ft/sec, indicative of limited community ambulator           Pertinent Vitals/Pain Pain Assessment Pain Assessment: No/denies pain    Home Living  PT lives with son.   Pt ambulates with a RW , pt continues to drive and does short shopping trips.  PT has no stairs.                        Prior Function  As above                      Extremity/Trunk Assessment        Lower Extremity Assessment Lower Extremity Assessment: Overall WFL for tasks assessed       Communication   Communication Communication: No apparent difficulties Cueing Techniques: Verbal cues  Cognition     Overall Cognitive Status: Within Functional Limits for tasks assessed  Exercises General Exercises - Lower Extremity Ankle Circles/Pumps: 10 reps, Both Gluteal Sets: 10 reps (bridges) Heel Slides: Both, 10 reps Hip ABduction/ADduction: Both, 10 reps Straight Leg Raises: Both, 5 reps   Assessment/Plan    PT Assessment Patient needs continued PT services  PT Problem List Decreased strength       PT Treatment Interventions Gait training;Therapeutic exercise    PT Goals (Current goals can be found in the Care Plan section)  Acute Rehab PT Goals Patient Stated Goal: To go home PT Goal Formulation: With patient Potential to Achieve Goals: Good    Frequency Min 2X/week         AM-PAC PT "6 Clicks" Mobility  Outcome Measure Help needed turning from your back to your side while in a flat bed without using bedrails?: None Help needed moving from lying on your back to sitting on the side of a flat bed without using bedrails?: None Help needed moving to and from a bed to a chair (including a wheelchair)?: None Help needed standing up from a chair using your arms (e.g., wheelchair or bedside chair)?: None Help needed to walk in hospital room?: A Little Help needed climbing 3-5 steps with a railing? : A Little 6 Click Score: 22    End of Session Equipment Utilized During Treatment: Gait belt Activity Tolerance: Patient tolerated treatment well Patient left: in bed Nurse Communication: Mobility status PT Visit Diagnosis: Muscle weakness (generalized) (M62.81)    Time: 1355-1415 PT Time Calculation (min) (ACUTE ONLY): 20 min   Charges:   PT Evaluation $PT Eval Low Complexity: 1 Low   PT General Charges $$ ACUTE PT VISIT: 1 Visit       Virgina Organ, PT CLT [phone]   04/25/2023, 2:25 PM

## 2023-04-25 NOTE — Progress Notes (Signed)
*  PRELIMINARY RESULTS* Echocardiogram 2D Echocardiogram has not been performed. At 1037 patient sitting up in chair with bed stripped of linen. At 1247 patient eating lunch.  Stacey Drain 04/25/2023, 1:43 PM

## 2023-04-25 NOTE — TOC Initial Note (Signed)
Transition of Care [facility] [place]) - Initial/Assessment Note    Patient Details  Name: Marie [name] MRN: [id number] Date of Birth: 1940-08-15  Transition of Care [facility]) CM/SW Contact:    Villa Herb, LCSWA Phone Number: 04/25/2023, 3:21 PM  Clinical Narrative:                 CSW updated that PT is recommending OP PT for pt at D/C. CSW spoke with pts son who is at bedside to complete assessment. Pt lives with her son. Pt is mostly independent in completing her ADLs but her son can assist when needed. Pt has a walker to use when needed. CSW spoke with pts son about PT eval for OP PT, he is requesting HH PT due to location and being a good distance from OP PT clinic. CSW spoke to Orwin with [facility] who accepted pt for [facility] PT services. CSW requested that MD place [facility] PT orders. TOC to follow.   Expected Discharge Plan: Home w Home Health Services Barriers to Discharge: Continued Medical Work up   Patient Goals and CMS Choice Patient states their goals for this hospitalization and ongoing recovery are:: return home CMS Medicare.gov Compare Post Acute Care list provided to:: Patient Choice offered to / list presented to : Patient      Expected Discharge Plan and Services In-house Referral: Clinical Social Work Discharge Planning Services: CM Consult Post Acute Care Choice: Home Health Living arrangements for the past 2 months: Single Family Home                             HH Agency: [facility] Date [facility] Agency Contacted: 04/25/23   Representative spoke with at [facility] Agency: Morrie Sheldon  Prior Living Arrangements/Services Living arrangements for the past 2 months: Single Family Home Lives with:: Adult Children Patient language and need for interpreter reviewed:: Yes Do you feel safe going back to the place where you live?: Yes      Need for Family Participation in Patient Care: Yes (Comment) Care giver support system in place?: Yes (comment) Current home services:  DME Criminal Activity/Legal Involvement Pertinent to Current Situation/Hospitalization: No - Comment as needed  Activities of Daily Living Home Assistive Devices/Equipment: None ADL Screening (condition at time of admission) Patient's cognitive ability adequate to safely complete daily activities?: Yes Is the patient deaf or have difficulty hearing?: No Does the patient have difficulty seeing, even when wearing glasses/contacts?: No Does the patient have difficulty concentrating, remembering, or making decisions?: No Patient able to express need for assistance with ADLs?: Yes Does the patient have difficulty dressing or bathing?: Yes Independently performs ADLs?: Yes (appropriate for developmental age) Does the patient have difficulty walking or climbing stairs?: Yes Weakness of Legs: Left Weakness of Arms/Hands: Left  Permission Sought/Granted                  Emotional Assessment Appearance:: Appears stated age Attitude/Demeanor/Rapport: Engaged Affect (typically observed): Accepting Orientation: : Oriented to Self, Oriented to Place, Oriented to  Time, Oriented to Situation Alcohol / Substance Use: Not Applicable Psych Involvement: No (comment)  Admission diagnosis:  TIA (transient ischemic attack) [G45.9] Speech abnormality [R47.9] Seizure-like activity [facility]) [R56.9] Patient Active Problem List   Diagnosis Date Noted   Sensory disturbance 04/25/2023   Speech abnormality 04/24/2023   Dyspnea 04/24/2023   Chest pain 11/21/2020   Dehydration 01/04/2017   LLQ pain 12/27/2015   Weight loss 12/17/2011  Lymphocytic colitis 11/21/2011   LUQ pain 11/21/2011   Diarrhea 04/30/2011   ANEMIA, MILD 08/05/2008   Depression 08/05/2008   Essential hypertension 08/05/2008   GERD 08/05/2008   Irritable bowel syndrome 08/05/2008   Diffuse connective tissue disease (HCC) 08/05/2008   ARTHRITIS 08/05/2008   FIBROMYALGIA 08/05/2008   ABDOMINAL BLOATING 08/05/2008   FECAL  INCONTINENCE 08/05/2008   DIARRHEA, RECURRENT 08/05/2008   Abdominal pain 08/05/2008   DIVERTICULOSIS, COLON, HX OF 08/05/2008   PCP:  [facility] Pharmacy:   [facility] - [place], [place] - [address] [place] [address] Phone: [phone] Fax: [phone]     Social Determinants of Health (SDOH) Social History: SDOH Screenings   Food Insecurity: No Food Insecurity (04/24/2023)  Housing: Low Risk  (04/24/2023)  Transportation Needs: No Transportation Needs (04/24/2023)  Utilities: Not At Risk (04/24/2023)  Tobacco Use: Low Risk  (04/24/2023)   SDOH Interventions:     Readmission Risk Interventions     No data to display

## 2023-04-25 NOTE — Procedures (Signed)
Patient Name: [name]  MRN: [id number]  Epilepsy Attending: Charlsie Quest  Referring Physician/Provider: Onnie Boer, MD  Date: 04/25/2023 Duration: 22.20 mins  Patient history: 82 year old female with 2 episodes of transient aphasia, numbness on left side of face, numbness in bilateral arms up to the elbows as well as some jerking movements. EEG to evaluate for seizure  Level of alertness: Awake, asleep  AEDs during EEG study: None  Technical aspects: This EEG study was done with scalp electrodes positioned according to the 10-20 International system of electrode placement. Electrical activity was reviewed with band pass filter of 1-70Hz , sensitivity of 7 uV/mm, display speed of 9mm/sec with a 60Hz  notched filter applied as appropriate. EEG data were recorded continuously and digitally stored.  Video monitoring was available and reviewed as appropriate.  Description: The posterior dominant rhythm consists of 8-9 Hz activity of moderate voltage (25-35 uV) seen predominantly in posterior head regions, symmetric and reactive to eye opening and eye closing. Sleep was characterized by vertex waves, sleep spindles (12 to 14 Hz), maximal frontocentral region. Hyperventilation and photic stimulation were not performed.     IMPRESSION: This study is within normal limits. No seizures or epileptiform discharges were seen throughout the recording.  A normal interictal EEG does not exclude the diagnosis of epilepsy.  Marie [name] Marie [name]

## 2023-04-25 NOTE — Plan of Care (Signed)
  Problem: Elimination: Goal: Will not experience complications related to urinary retention Outcome: Completed/Met   Problem: Skin Integrity: Goal: Risk for impaired skin integrity will decrease Outcome: Progressing

## 2023-04-25 NOTE — Care Management Obs Status (Signed)
MEDICARE OBSERVATION STATUS NOTIFICATION   Patient Details  Name: LESLI SWARBRICK MRN: [id number] Date of Birth: 01/11/1941   Medicare Observation Status Notification Given:  Yes (copy mailed to address on file)    Corey Harold 04/25/2023, 4:06 PM

## 2023-04-25 NOTE — Discharge Summary (Addendum)
Physician Discharge Summary   Patient: Marie [name] MRN: [id number] DOB: May 26, 1941  Admit date:     04/24/2023  Discharge date: 04/25/23  Discharge Physician: Onalee Hua Xzavier Swinger   PCP: [facility]   Recommendations at discharge:   Please follow up with primary care provider within 1-2 weeks  Please repeat BMP and CBC in one week    Hospital Course: 82 year old female with a history of microscopic lightest, hypertension, depression, fibromyalgia, IBS-D presenting initially with shortness of breath and abdominal pain and diarrhea. Son was driving patient to the ED when enroute-patient started having shaking and numbness to her bilateral upper and lower extremities; son also reported left facial droop.  Pt states she remembers these episodes, and her extremities felt tight.  The episode lasted about 15 minutes.  It had resolved by the time she was in the emergency department.  However, while she was in the ED, she had another episode of inability to speak and numbness in her upper extremities.  She stated that this episode lasted about 10 minutes.  The patient denies loss of consciousness.  Regarding her abdominal pain, it is primarily in the left lower quadrant.  This appears to be chronic for several years.  She has been followed by GI, Dr. Jena Gauss for IBS-D.  However during her last visit in March 2024, the patient was having constipation and was given a prescription for Linzess.  The patient takes Linzess as needed.  She states she last took it about a week ago.  She denies any hematochezia or melena.  She denies any new medications.  The patient did have an episode of nausea and vomiting on 04/24/2023.  This has improved since admission. Regarding her shortness of breath, she states that it is been present for over a year.  She states she has been following up with cardiology and pulmonology in [place].  She denies any chest pain, cough, hemoptysis.  She describes her dyspnea  as primarily dyspnea on exertion.  She states that has not been any significantly worse.  She denies any fevers, chills.  In the ED, the patient was afebrile and hemodynamically stable with oxygen saturation 97% room air.  WBC 5.4, hemoglobin 10.5, platelets 206,000.  Sodium 133, potassium 3.6, bicarbonate 23, serum creatinine 1.12.  Troponin 5>>9.  Urine drug screen is negative.  Alcohol level is negative. MRI of the brain was negative for acute findings although it was limited by patient intolerance.  Chest x-ray showed retrocardiac atelectasis.  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  There is a small umbilical hernia filled with fat.  This is stable right renal cyst.  The patient was started on a clear liquid diet initially.  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 for history of microscopic colitis -Last took Linzess 1 week prior to admission -9/18 CT abdomen/pelvis--diverticulosis; no other acute findings -Stool pathogen panel--no BM during hospitalization -continue IVF -diet advanced which pt tolerated   Essential hypertension -Holding losartan/HCTZ temporarily>>restart after d/c   Dyspnea -Stable on room air -Echocardiogram--pt did not want to wait for it to be  done since she alreal follows cards as outpt -Outpatient follow-up with cardiology and pulmonology if echo is reassuring -Personally reviewed chest x-ray   Fibromyalgia -no longer taking celexa -outpatient follow up with PCP -Continue Celexa    Hypokalemia -repleted -mag 1.9      Consultants: neruology Procedures performed: EEG  Disposition: Home Diet recommendation:  Cardiac diet DISCHARGE MEDICATION: Allergies as of 04-30-2023       Reactions   Codeine Hives, Swelling   Promethazine Hcl    Other Reaction(s): swell up        Medication List     STOP taking these medications    cefUROXime 250 MG tablet Commonly known as: CEFTIN   ipratropium-albuterol 0.5-2.5 (3) MG/3ML Soln Commonly known as: DUONEB       TAKE these medications    budesonide 0.25 MG/2ML nebulizer solution Commonly known as: PULMICORT Take 2 mLs by nebulization daily.   CALCIUM PO Take 1 tablet by mouth every morning.   CENTRUM SILVER ULTRA WOMENS PO Take 1 tablet by mouth every morning.   citalopram 40 MG tablet Commonly known as: CELEXA Take 40 mg by mouth daily.   clonazePAM 1 MG tablet Commonly known as: KlonoPIN Take 1 tablet (1 mg total) by mouth daily as needed for anxiety.   cyanocobalamin 1000 MCG tablet Commonly known as: VITAMIN B12 Take 1,000 mcg by mouth daily.   esomeprazole 40 MG capsule Commonly known as: NEXIUM Take 40 mg by mouth daily.   Fish Oil 1000 MG Caps Take 2 capsules by mouth daily.   hydrochlorothiazide 12.5 MG tablet Commonly known as: HYDRODIURIL Take 12.5 mg by mouth daily.   hyoscyamine 0.125 MG/ML solution Commonly known as: LEVSIN Take 2 mLs (0.25 mg total) by mouth 3 (three) times daily.   ipratropium 0.02 % nebulizer solution Commonly known as: ATROVENT Take 2.5 mLs by nebulization every 6 (six) hours as needed for wheezing or shortness of breath.   losartan 50 MG tablet Commonly known as: COZAAR Take 50 mg by mouth every morning. What changed: Another medication with the same name was removed. Continue taking this medication, and follow the directions you see here.   traMADol 50 MG tablet Commonly known as: ULTRAM Take 50 mg by mouth every 6 (six) hours as needed.  For pain   VITAMIN C PO Take 1 tablet by mouth every morning.        Discharge Exam: Filed Weights   04/24/23 1518  Weight: 65.3 kg   HEENT:  [place]/AT, No thrush, no icterus CV:  RRR, no rub, no S3, no S4 Lung:  CTA, no wheeze, no rhonchi Abd:  soft/+BS, NT Ext:  No edema, no lymphangitis, no synovitis, no rash   Condition at discharge: stable  The results of significant diagnostics from this hospitalization (including imaging, microbiology, ancillary and laboratory) are listed below for reference.   Imaging Studies: EEG adult  Result Date: Apr 30, 2023 Charlsie Quest, MD     April 30, 2023  3:49 PM Patient Name: SHARIYA WRIDE MRN: [id number] Epilepsy Attending: Charlsie Quest Referring Physician/Provider: Onnie Boer, MD Date: 04/30/2023 Duration: 22.20 mins Patient history: 82 year old female with 2 episodes of transient aphasia, numbness on left side of face, numbness in bilateral arms up to the elbows as well as some jerking movements. EEG to evaluate for seizure Level of alertness: Awake, asleep AEDs during EEG study: None Technical aspects: This EEG study was done with scalp electrodes positioned according to the 10-20 International system of electrode placement.  Electrical activity was reviewed with band pass filter of 1-70Hz , sensitivity of 7 uV/mm, display speed of 47mm/sec with a 60Hz  notched filter applied as appropriate. EEG data were recorded continuously and digitally stored.  Video monitoring was available and reviewed as appropriate. Description: The posterior dominant rhythm consists of 8-9 Hz activity of moderate voltage (25-35 uV) seen predominantly in posterior head regions, symmetric and reactive to eye opening and eye closing. Sleep was characterized by vertex waves, sleep spindles (12 to 14 Hz), maximal frontocentral region. Hyperventilation and photic stimulation were not performed.   IMPRESSION: This study is within normal limits. No seizures or epileptiform  discharges were seen throughout the recording. A normal interictal EEG does not exclude the diagnosis of epilepsy. Charlsie Quest   CT ABDOMEN PELVIS W CONTRAST  Result Date: 04/24/2023 CLINICAL DATA:  Diffuse abdominal pain EXAM: CT ABDOMEN AND PELVIS WITH CONTRAST TECHNIQUE: Multidetector CT imaging of the abdomen and pelvis was performed using the standard protocol following bolus administration of intravenous contrast. RADIATION DOSE REDUCTION: This exam was performed according to the departmental dose-optimization program which includes automated exposure control, adjustment of the mA and/or kV according to patient size and/or use of iterative reconstruction technique. CONTRAST:  80mL OMNIPAQUE IOHEXOL 300 MG/ML  SOLN COMPARISON:  12/04/2021 FINDINGS: Lower chest: Mild emphysematous changes are noted. No focal infiltrate is seen. Hepatobiliary: No focal liver abnormality is seen. No gallstones, gallbladder wall thickening, or biliary dilatation. Pancreas: Unremarkable. No pancreatic ductal dilatation or surrounding inflammatory changes. Spleen: Normal in size without focal abnormality. Adrenals/Urinary Tract: Adrenal glands are within normal limits. Kidneys demonstrate a normal enhancement pattern. No renal calculi or obstructive changes are noted. Large right renal cyst is noted exophytic from the lower pole measuring up to 5 cm. This is stable in appearance from the prior exam. No follow-up is recommended. No obstructive changes are seen. The bladder is well distended. Stomach/Bowel: Scattered diverticular changes noted without evidence of diverticulitis. No obstructive or inflammatory changes of the colon are seen. A few loops of small bowel are noted within a spigelian hernia anteriorly on the right. No obstructive changes are noted. Small bowel is otherwise unremarkable. Stomach shows a large sliding-type hiatal hernia. Vascular/Lymphatic: Aortic atherosclerosis. No enlarged abdominal or pelvic lymph  nodes. Reproductive: Status post hysterectomy. No adnexal masses. Other: Fat containing umbilical hernia is noted anteriorly. No abdominopelvic ascites. Musculoskeletal: Degenerative changes of lumbar spine are noted. No acute bony abnormality is seen. IMPRESSION: Diverticulosis without diverticulitis. Right-sided spigelian hernia containing a few loops of small bowel without obstructive change. Small fat containing umbilical hernia. Electronically Signed   By: Alcide Clever M.D.   On: 04/24/2023 21:14   MR BRAIN WO CONTRAST  Result Date: 04/24/2023 CLINICAL DATA:  Transient ischemic attack, seizure EXAM: MRI HEAD WITHOUT CONTRAST TECHNIQUE: Multiplanar, multiecho pulse sequences of the brain and surrounding structures were obtained without intravenous contrast. COMPARISON:  No prior MRI available, correlation is made with 04/24/2023 CT head FINDINGS: Evaluation is limited as the patient refused to complete the study. The axial T1 and coronal T2 sequences could not be obtained. Evaluation is also limited by motion. Brain: No restricted diffusion to suggest acute or subacute infarct. No acute hemorrhage, mass, mass effect, or midline shift. No hydrocephalus or extra-axial collection. Partial empty sella. Normal craniocervical junction. No hemosiderin deposition to suggest remote hemorrhage. T2 hyperintense signal in the periventricular white matter, likely the sequela of chronic small vessel ischemic disease. Vascular: Unable to evaluate. Skull and upper cervical spine:  Grossly normal marrow signal. Sinuses/Orbits: Clear paranasal sinuses. No acute finding in the orbits. Status post bilateral lens replacements. Other: The mastoid air cells are well aerated. IMPRESSION: Evaluation is limited as the patient refused to complete the study, as well as by motion. Within these limitations, there is no acute intracranial process. No evidence of acute or subacute infarct. Electronically Signed   By: Wiliam Ke M.D.    On: 04/24/2023 20:03   DG Chest Port 1 View  Result Date: 04/24/2023 CLINICAL DATA:  Shortness of breath. EXAM: PORTABLE CHEST 1 VIEW COMPARISON:  12/10/2022. FINDINGS: Bilateral lung fields are clear. There are probable atelectatic changes in the left retrocardiac region. Apparent faint opacification of left lung base is likely secondary to overlying soft tissue/breast shadow. Bilateral costophrenic angles are clear. Normal cardio-mediastinal silhouette. No acute osseous abnormalities. The soft tissues are within normal limits. IMPRESSION: 1. Probable left retrocardiac atelectasis. Electronically Signed   By: Jules Schick M.D.   On: 04/24/2023 18:44   CT HEAD CODE STROKE WO CONTRAST  Result Date: 04/24/2023 CLINICAL DATA:  Code stroke.  Neuro deficit, acute, stroke suspected EXAM: CT HEAD WITHOUT CONTRAST TECHNIQUE: Contiguous axial images were obtained from the base of the skull through the vertex without intravenous contrast. RADIATION DOSE REDUCTION: This exam was performed according to the departmental dose-optimization program which includes automated exposure control, adjustment of the mA and/or kV according to patient size and/or use of iterative reconstruction technique. COMPARISON:  CT head 02/07/2022. FINDINGS: Brain: No evidence of acute infarction, hemorrhage, hydrocephalus, extra-axial collection or mass lesion/mass effect. Patchy white matter hypodensities, nonspecific but compatible with chronic microvascular ischemic change. Vascular: No hyperdense vessel. Skull: No acute fracture. Sinuses/Orbits: No acute finding. Other: No mastoid effusions. ASPECTS [facility] Stroke Program Early CT Score) total score (0-10 with 10 being normal): 10. IMPRESSION: 1. No evidence of acute intracranial abnormality. 2. ASPECTS is 10. Code stroke imaging results were communicated on 04/24/2023 at 5:20 pm to provider NANAVATI via telephone, who verbally acknowledged these results. Electronically Signed   By:  Feliberto Harts M.D.   On: 04/24/2023 17:21   US RENAL  Result Date: 04/19/2023 CLINICAL DATA:  Left flank pain.  Recurrent UTIs. EXAM: RENAL / URINARY TRACT ULTRASOUND COMPLETE COMPARISON:  CT abdomen and pelvis Dec 04, 2021 FINDINGS: Right Kidney: Renal measurements: 7.6 x 3.9 x 4.1 cm = volume: 63.3 mL. Echogenicity within normal limits. No mass or hydronephrosis visualized. Left Kidney: Renal measurements: 8.6 x 4.2 x 4.2 cm = volume: 79.8 mL. Echogenicity within normal limits. No mass or hydronephrosis visualized. Bladder: Patient recently voided, the bladder is decompressed, unable to visualize. Other: None. IMPRESSION: 1. No acute abnormality identified. 2. Patient recently voided, the bladder is decompressed, unable to visualize. Electronically Signed   By: Sherian Rein M.D.   On: 04/19/2023 09:50    Microbiology: Results for orders placed or performed during the hospital encounter of 12/10/22  Urine Culture     Status: Abnormal   Collection Time: 12/10/22  2:34 PM   Specimen: Urine, Clean Catch  Result Value Ref Range Status   Specimen Description   Final    URINE, CLEAN CATCH Performed at [facility], [address], [place], [place] [address]    Special Requests   Final    NONE Performed at [facility], [address], [place], [place] [address]    Culture >=100,000 COLONIES/mL ESCHERICHIA COLI (A)  Final   Report Status 12/12/2022 FINAL  Final   Organism ID,  Bacteria ESCHERICHIA COLI (A)  Final      Susceptibility   Escherichia coli - MIC*    AMPICILLIN >=32 RESISTANT Resistant     CEFAZOLIN 16 SENSITIVE Sensitive     CEFEPIME <=0.12 SENSITIVE Sensitive     CEFTRIAXONE <=0.25 SENSITIVE Sensitive     CIPROFLOXACIN 0.5 INTERMEDIATE Intermediate     GENTAMICIN <=1 SENSITIVE Sensitive     IMIPENEM <=0.25 SENSITIVE Sensitive     NITROFURANTOIN <=16 SENSITIVE Sensitive     TRIMETH/SULFA <=20 SENSITIVE Sensitive     AMPICILLIN/SULBACTAM >=32 RESISTANT Resistant     PIP/TAZO  <=4 SENSITIVE Sensitive     * >=100,000 COLONIES/mL ESCHERICHIA COLI  SARS Coronavirus 2 by RT PCR (hospital order, performed in [facility] hospital lab) *cepheid single result test* Anterior Nasal Swab     Status: None   Collection Time: 12/10/22  5:20 PM   Specimen: Anterior Nasal Swab  Result Value Ref Range Status   SARS Coronavirus 2 by RT PCR NEGATIVE NEGATIVE Final    Comment: (NOTE) SARS-CoV-2 target nucleic acids are NOT DETECTED.  The SARS-CoV-2 RNA is generally detectable in upper and lower respiratory specimens during the acute phase of infection. The lowest concentration of SARS-CoV-2 viral copies this assay can detect is 250 copies / mL. A negative result does not preclude SARS-CoV-2 infection and should not be used as the sole basis for treatment or other patient management decisions.  A negative result may occur with improper specimen collection / handling, submission of specimen other than nasopharyngeal swab, presence of viral mutation(s) within the areas targeted by this assay, and inadequate number of viral copies (<250 copies / mL). A negative result must be combined with clinical observations, patient history, and epidemiological information.  Fact Sheet for Patients:   RoadLapTop.co.za  Fact Sheet for Healthcare Providers: http://kim-miller.com/  This test is not yet approved or  cleared by the [place] FDA and has been authorized for detection and/or diagnosis of SARS-CoV-2 by FDA under an Emergency Use Authorization (EUA).  This EUA will remain in effect (meaning this test can be used) for the duration of the COVID-19 declaration under Section 564(b)(1) of the Act, 21 U.S.C. section 360bbb-3(b)(1), unless the authorization is terminated or revoked sooner.  Performed at [facility], [address] [place], [place] [address]     Labs: CBC: Recent Labs  Lab 04/24/23 1656  WBC 5.4  NEUTROABS 2.9   HGB 10.5*  HCT 32.5*  MCV 89.8  PLT 206   Basic Metabolic Panel: Recent Labs  Lab 04/24/23 1656 04/25/23 0935  NA 133* 136  K 3.6 3.2*  CL 99 102  CO2 23 21*  GLUCOSE 98 138*  BUN 23 17  CREATININE 1.12* 0.92  CALCIUM 9.0 9.0  MG  --  1.9   Liver Function Tests: No results for input(s): "AST", "ALT", "ALKPHOS", "BILITOT", "PROT", "ALBUMIN" in the last 168 hours. CBG: No results for input(s): "GLUCAP" in the last 168 hours.  Discharge time spent: greater than 30 minutes.  Signed: Catarina Hartshorn, MD [facility] 04/25/2023

## 2023-04-25 NOTE — Telephone Encounter (Signed)
Patient needs routine hospital follow up with Dr. Jena Gauss only.

## 2023-04-25 NOTE — Progress Notes (Signed)
PROGRESS NOTE  Marie [name]:[id number] DOB: [birth year] DOA: 04/24/2023 PCP: [facility]  Brief History:  82 year old female with a history of microscopic lightest, hypertension, depression, fibromyalgia, IBS-D presenting initially with shortness of breath and abdominal pain and diarrhea. Son was driving patient to the ED when enroute-patient started having shaking and numbness to her bilateral upper and lower extremities; son also reported left facial droop.  Pt states she remembers these episodes, and her extremities felt tight.  The episode lasted about 15 minutes.  It had resolved by the time she was in the emergency department.  However, while she was in the ED, she had another episode of inability to speak and numbness in her upper extremities.  She stated that this episode lasted about 10 minutes.  The patient denies loss of consciousness.  Regarding her abdominal pain, it is primarily in the left lower quadrant.  This appears to be chronic for several years.  She has been followed by GI, Dr. Jena Gauss for IBS-D.  However during her last visit in March 2024, the patient was having constipation and was given a prescription for Linzess.  The patient takes Linzess as needed.  She states she last took it about a week ago.  She denies any hematochezia or melena.  She denies any new medications.  The patient did have an episode of nausea and vomiting on 04/24/2023.  This has improved since admission. Regarding her shortness of breath, she states that it is been present for over a year.  She states she has been following up with cardiology and pulmonology in [place].  She denies any chest pain, cough, hemoptysis.  She describes her dyspnea as primarily dyspnea on exertion.  She states that has not been any significantly worse.  She denies any fevers, chills.  In the ED, the patient was afebrile and hemodynamically stable with oxygen saturation 97% room air.  WBC 5.4,  hemoglobin 10.5, platelets 206,000.  Sodium 133, potassium 3.6, bicarbonate 23, serum creatinine 1.12.  Troponin 5>>9.  Urine drug screen is negative.  Alcohol level is negative. MRI of the brain was negative for acute findings although it was limited by patient intolerance.  Chest x-ray showed retrocardiac atelectasis.  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  There is a small umbilical hernia filled with fat.  This is stable right renal cyst.  The patient was started on a clear liquid diet initially.   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 for history of microscopic colitis -Last took Linzess 1 week prior to admission -9/18 CT abdomen/pelvis--diverticulosis; no other acute findings -Stool pathogen panel -continue IVF  Essential hypertension -Holding losartan/HCTZ temporarily  Dyspnea -Stable on room air -Echocardiogram -Outpatient follow-up with cardiology and pulmonology if echo is reassuring -Personally reviewed chest x-ray  Fibromyalgia -no longer taking celexa -outpatient follow up with PCP -Continue Celexa         Family Communication: no  Family at bedside  Consultants:  GI, neurology  Code Status:  FULL   DVT Prophylaxis:  [place] Heparin / [place] Lovenox   Procedures: As Listed in Progress Note Above  Antibiotics: None      Subjective: Patient states that her  speech abnormality is improved without any recurrence.  She denies any dysesthesias.  She denies any weakness in her extremities.  She denies any headache, chest pain.  She continues to have some dyspnea on exertion.  She has not had any hematochezia or melena.  She has no further  emesis.  Objective: Vitals:   04/25/23 0215 04/25/23 0540 04/25/23 0826 04/25/23 0827  BP: (!) 161/66 130/86    Pulse: 85 79    Resp: 18     Temp: 98 F (36.7 C) 98.5 F (36.9 C)    TempSrc: Oral Oral    SpO2: 97% 97% 93% 98%  Weight:      Height:        Intake/Output Summary (Last 24 hours) at 04/25/2023 0852 Last data filed at 04/25/2023 0754 Gross per 24 hour  Intake 1154.25 ml  Output 300 ml  Net 854.25 ml   Weight change:  Exam:  General:  Pt is alert, follows commands appropriately, not in acute distress HEENT: No icterus, No thrush, No neck mass, [place]/AT Cardiovascular: RRR, S1/S2, no rubs, no gallops Respiratory: CTA bilaterally, no wheezing, no crackles, no rhonchi Abdomen: Soft/+BS, non tender, non distended, no guarding Extremities: No edema, No lymphangitis, No petechiae, No rashes, no synovitis Neuro:  CN II-XII intact, strength 4/5 in RUE, RLE, strength 4/5 LUE, LLE; sensation intact bilateral; no dysmetria; babinski equivocal    Data Reviewed: I have personally reviewed following labs and imaging studies Basic Metabolic Panel: Recent Labs  Lab 04/24/23 1656  NA 133*  K 3.6  CL 99  CO2 23  GLUCOSE 98  BUN 23  CREATININE 1.12*  CALCIUM 9.0   Liver Function Tests: No results for input(s): "AST", "ALT", "ALKPHOS", "BILITOT", "PROT", "ALBUMIN" in the last 168 hours. No results for input(s): "LIPASE", "AMYLASE" in the last 168 hours. No results for input(s): "AMMONIA" in the last 168 hours. Coagulation Profile: Recent Labs  Lab 04/24/23 1656  INR 1.1   CBC: Recent Labs  Lab 04/24/23 1656  WBC 5.4  NEUTROABS 2.9  HGB 10.5*  HCT 32.5*  MCV 89.8  PLT 206   Cardiac Enzymes: No results for input(s): "CKTOTAL", "CKMB", "CKMBINDEX", "TROPONINI" in the last 168 hours. BNP: Invalid input(s): "POCBNP" CBG: No results for input(s): "GLUCAP" in the last 168 hours. HbA1C: No results for input(s): "HGBA1C" in the last 72 hours. Urine analysis:     Component Value Date/Time   COLORURINE YELLOW 04/24/2023 2151   APPEARANCEUR HAZY (A) 04/24/2023 2151   LABSPEC 1.025 04/24/2023 2151   PHURINE 8.0 04/24/2023 2151   GLUCOSEU NEGATIVE 04/24/2023 2151   HGBUR NEGATIVE 04/24/2023 2151   BILIRUBINUR NEGATIVE 04/24/2023 2151   KETONESUR 20 (A) 04/24/2023 2151   PROTEINUR 100 (A) 04/24/2023 2151   UROBILINOGEN 0.2 08/14/2011 1753   NITRITE NEGATIVE 04/24/2023 2151   LEUKOCYTESUR NEGATIVE 04/24/2023 2151   Sepsis Labs: @LABRCNTIP (procalcitonin:4,lacticidven:4) )No results found for this or any previous visit (from the past 240 hour(s)).   Scheduled Meds:  budesonide  2 mL Nebulization Daily   enoxaparin (LOVENOX) injection  40 mg Subcutaneous QHS   ipratropium-albuterol  3 mL Nebulization TID   Continuous Infusions:  sodium chloride 20 mL/hr at 04/25/23 0754    Procedures/Studies: CT ABDOMEN PELVIS W CONTRAST  Result Date: 04/24/2023 CLINICAL DATA:  Diffuse abdominal pain EXAM: CT ABDOMEN AND PELVIS WITH CONTRAST TECHNIQUE: Multidetector CT imaging of the abdomen and pelvis was performed using the standard protocol following bolus administration of intravenous contrast. RADIATION DOSE REDUCTION:  This exam was performed according to the departmental dose-optimization program which includes automated exposure control, adjustment of the mA and/or kV according to patient size and/or use of iterative reconstruction technique. CONTRAST:  80mL OMNIPAQUE IOHEXOL 300 MG/ML  SOLN COMPARISON:  12/04/2021 FINDINGS: Lower chest: Mild emphysematous changes are noted. No focal infiltrate is seen. Hepatobiliary: No focal liver abnormality is seen. No gallstones, gallbladder wall thickening, or biliary dilatation. Pancreas: Unremarkable. No pancreatic ductal dilatation or surrounding inflammatory changes. Spleen: Normal in size without focal abnormality. Adrenals/Urinary Tract: Adrenal glands are within normal limits. Kidneys demonstrate a normal  enhancement pattern. No renal calculi or obstructive changes are noted. Large right renal cyst is noted exophytic from the lower pole measuring up to 5 cm. This is stable in appearance from the prior exam. No follow-up is recommended. No obstructive changes are seen. The bladder is well distended. Stomach/Bowel: Scattered diverticular changes noted without evidence of diverticulitis. No obstructive or inflammatory changes of the colon are seen. A few loops of small bowel are noted within a spigelian hernia anteriorly on the right. No obstructive changes are noted. Small bowel is otherwise unremarkable. Stomach shows a large sliding-type hiatal hernia. Vascular/Lymphatic: Aortic atherosclerosis. No enlarged abdominal or pelvic lymph nodes. Reproductive: Status post hysterectomy. No adnexal masses. Other: Fat containing umbilical hernia is noted anteriorly. No abdominopelvic ascites. Musculoskeletal: Degenerative changes of lumbar spine are noted. No acute bony abnormality is seen. IMPRESSION: Diverticulosis without diverticulitis. Right-sided spigelian hernia containing a few loops of small bowel without obstructive change. Small fat containing umbilical hernia. Electronically Signed   By: Alcide Clever M.D.   On: 04/24/2023 21:14   MR BRAIN WO CONTRAST  Result Date: 04/24/2023 CLINICAL DATA:  Transient ischemic attack, seizure EXAM: MRI HEAD WITHOUT CONTRAST TECHNIQUE: Multiplanar, multiecho pulse sequences of the brain and surrounding structures were obtained without intravenous contrast. COMPARISON:  No prior MRI available, correlation is made with 04/24/2023 CT head FINDINGS: Evaluation is limited as the patient refused to complete the study. The axial T1 and coronal T2 sequences could not be obtained. Evaluation is also limited by motion. Brain: No restricted diffusion to suggest acute or subacute infarct. No acute hemorrhage, mass, mass effect, or midline shift. No hydrocephalus or extra-axial collection.  Partial empty sella. Normal craniocervical junction. No hemosiderin deposition to suggest remote hemorrhage. T2 hyperintense signal in the periventricular white matter, likely the sequela of chronic small vessel ischemic disease. Vascular: Unable to evaluate. Skull and upper cervical spine: Grossly normal marrow signal. Sinuses/Orbits: Clear paranasal sinuses. No acute finding in the orbits. Status post bilateral lens replacements. Other: The mastoid air cells are well aerated. IMPRESSION: Evaluation is limited as the patient refused to complete the study, as well as by motion. Within these limitations, there is no acute intracranial process. No evidence of acute or subacute infarct. Electronically Signed   By: Wiliam Ke M.D.   On: 04/24/2023 20:03   DG Chest Port 1 View  Result Date: 04/24/2023 CLINICAL DATA:  Shortness of breath. EXAM: PORTABLE CHEST 1 VIEW COMPARISON:  12/10/2022. FINDINGS: Bilateral lung fields are clear. There are probable atelectatic changes in the left retrocardiac region. Apparent faint opacification of left lung base is likely secondary to overlying soft tissue/breast shadow. Bilateral costophrenic angles are clear. Normal cardio-mediastinal silhouette. No acute osseous abnormalities. The soft tissues are within normal limits. IMPRESSION: 1. Probable left retrocardiac atelectasis. Electronically Signed   By: Jules Schick M.D.   On: 04/24/2023 18:44   CT HEAD CODE STROKE WO  CONTRAST  Result Date: 04/24/2023 CLINICAL DATA:  Code stroke.  Neuro deficit, acute, stroke suspected EXAM: CT HEAD WITHOUT CONTRAST TECHNIQUE: Contiguous axial images were obtained from the base of the skull through the vertex without intravenous contrast. RADIATION DOSE REDUCTION: This exam was performed according to the departmental dose-optimization program which includes automated exposure control, adjustment of the mA and/or kV according to patient size and/or use of iterative reconstruction  technique. COMPARISON:  CT head 02/07/2022. FINDINGS: Brain: No evidence of acute infarction, hemorrhage, hydrocephalus, extra-axial collection or mass lesion/mass effect. Patchy white matter hypodensities, nonspecific but compatible with chronic microvascular ischemic change. Vascular: No hyperdense vessel. Skull: No acute fracture. Sinuses/Orbits: No acute finding. Other: No mastoid effusions. ASPECTS [facility] Stroke Program Early CT Score) total score (0-10 with 10 being normal): 10. IMPRESSION: 1. No evidence of acute intracranial abnormality. 2. ASPECTS is 10. Code stroke imaging results were communicated on 04/24/2023 at 5:20 pm to provider NANAVATI via telephone, who verbally acknowledged these results. Electronically Signed   By: Feliberto Harts M.D.   On: 04/24/2023 17:21   US RENAL  Result Date: 04/19/2023 CLINICAL DATA:  Left flank pain.  Recurrent UTIs. EXAM: RENAL / URINARY TRACT ULTRASOUND COMPLETE COMPARISON:  CT abdomen and pelvis Dec 04, 2021 FINDINGS: Right Kidney: Renal measurements: 7.6 x 3.9 x 4.1 cm = volume: 63.3 mL. Echogenicity within normal limits. No mass or hydronephrosis visualized. Left Kidney: Renal measurements: 8.6 x 4.2 x 4.2 cm = volume: 79.8 mL. Echogenicity within normal limits. No mass or hydronephrosis visualized. Bladder: Patient recently voided, the bladder is decompressed, unable to visualize. Other: None. IMPRESSION: 1. No acute abnormality identified. 2. Patient recently voided, the bladder is decompressed, unable to visualize. Electronically Signed   By: Sherian Rein M.D.   On: 04/19/2023 09:50    Catarina Hartshorn, DO  [facility]  If 7PM-7AM, please contact night-coverage [email or web address] Password [email or web address] 04/25/2023, 8:52 AM   LOS: 0 days

## 2023-04-25 NOTE — Progress Notes (Signed)
Ng Discharge Note  Admit Date:  04/24/2023 Discharge date: 04/25/2023   Burt Ek to be D/C'd Home per MD order.  AVS completed. Patient/caregiver able to verbalize understanding.  Discharge Medication: Allergies as of 04/25/2023       Reactions   Codeine Hives, Swelling   Promethazine Hcl    Other Reaction(s): swell up        Medication List     STOP taking these medications    cefUROXime 250 MG tablet Commonly known as: CEFTIN   ipratropium-albuterol 0.5-2.5 (3) MG/3ML Soln Commonly known as: DUONEB       TAKE these medications    budesonide 0.25 MG/2ML nebulizer solution Commonly known as: PULMICORT Take 2 mLs by nebulization daily.   CALCIUM PO Take 1 tablet by mouth every morning.   CENTRUM SILVER ULTRA WOMENS PO Take 1 tablet by mouth every morning.   citalopram 40 MG tablet Commonly known as: CELEXA Take 40 mg by mouth daily.   clonazePAM 1 MG tablet Commonly known as: KlonoPIN Take 1 tablet (1 mg total) by mouth daily as needed for anxiety.   cyanocobalamin 1000 MCG tablet Commonly known as: VITAMIN B12 Take 1,000 mcg by mouth daily.   esomeprazole 40 MG capsule Commonly known as: NEXIUM Take 40 mg by mouth daily.   Fish Oil 1000 MG Caps Take 2 capsules by mouth daily.   hydrochlorothiazide 12.5 MG tablet Commonly known as: HYDRODIURIL Take 12.5 mg by mouth daily.   hyoscyamine 0.125 MG/ML solution Commonly known as: LEVSIN Take 2 mLs (0.25 mg total) by mouth 3 (three) times daily.   ipratropium 0.02 % nebulizer solution Commonly known as: ATROVENT Take 2.5 mLs by nebulization every 6 (six) hours as needed for wheezing or shortness of breath.   losartan 50 MG tablet Commonly known as: COZAAR Take 50 mg by mouth every morning. What changed: Another medication with the same name was removed. Continue taking this medication, and follow the directions you see here.   traMADol 50 MG tablet Commonly known as: ULTRAM Take 50 mg by  mouth every 6 (six) hours as needed. For pain   VITAMIN C PO Take 1 tablet by mouth every morning.        Discharge Assessment: Vitals:   04/25/23 1357 04/25/23 1425  BP: (!) 144/82   Pulse: 75   Resp: 19   Temp: 98.3 F (36.8 C)   SpO2: 97% 93%   Skin clean, dry and intact without evidence of skin break down, no evidence of skin tears noted. IV catheter discontinued intact. Site without signs and symptoms of complications - no redness or edema noted at insertion site, patient denies c/o pain - only slight tenderness at site.  Dressing with slight pressure applied.  D/c Instructions-Education: Discharge instructions given to patient/family with verbalized understanding. D/c education completed with patient/family including follow up instructions, medication list, d/c activities limitations if indicated, with other d/c instructions as indicated by MD - patient able to verbalize understanding, all questions fully answered. Patient instructed to return to ED, call 911, or call MD for any changes in condition.  Patient escorted via WC, and D/C home via private auto.  Cristal Ford, LPN [id number] 6:96 PM

## 2023-05-02 ENCOUNTER — Encounter (HOSPITAL_COMMUNITY): Payer: Self-pay | Admitting: Emergency Medicine

## 2023-05-02 ENCOUNTER — Inpatient Hospital Stay (HOSPITAL_COMMUNITY)
Admission: EM | Admit: 2023-05-02 | Discharge: 2023-05-08 | DRG: 193 | Disposition: A | Discharge status: Skilled Nursing Facility | Payer: Medicare (Managed Care) | Attending: Internal Medicine | Admitting: Internal Medicine

## 2023-05-02 ENCOUNTER — Other Ambulatory Visit: Payer: Self-pay

## 2023-05-02 ENCOUNTER — Emergency Department (HOSPITAL_COMMUNITY): Payer: Medicare (Managed Care)

## 2023-05-02 DIAGNOSIS — I951 Orthostatic hypotension: Secondary | ICD-10-CM | POA: Diagnosis present

## 2023-05-02 DIAGNOSIS — R55 Syncope and collapse: Secondary | ICD-10-CM | POA: Diagnosis present

## 2023-05-02 DIAGNOSIS — Z8249 Family history of ischemic heart disease and other diseases of the circulatory system: Secondary | ICD-10-CM

## 2023-05-02 DIAGNOSIS — Z8616 Personal history of COVID-19: Secondary | ICD-10-CM

## 2023-05-02 DIAGNOSIS — Z79899 Other long term (current) drug therapy: Secondary | ICD-10-CM

## 2023-05-02 DIAGNOSIS — R0902 Hypoxemia: Secondary | ICD-10-CM

## 2023-05-02 DIAGNOSIS — Z7951 Long term (current) use of inhaled steroids: Secondary | ICD-10-CM

## 2023-05-02 DIAGNOSIS — J13 Pneumonia due to Streptococcus pneumoniae: Secondary | ICD-10-CM | POA: Diagnosis not present

## 2023-05-02 DIAGNOSIS — E860 Dehydration: Secondary | ICD-10-CM | POA: Diagnosis present

## 2023-05-02 DIAGNOSIS — R053 Chronic cough: Secondary | ICD-10-CM | POA: Diagnosis present

## 2023-05-02 DIAGNOSIS — I2489 Other forms of acute ischemic heart disease: Secondary | ICD-10-CM | POA: Diagnosis present

## 2023-05-02 DIAGNOSIS — Z87440 Personal history of urinary (tract) infections: Secondary | ICD-10-CM

## 2023-05-02 DIAGNOSIS — Z87891 Personal history of nicotine dependence: Secondary | ICD-10-CM

## 2023-05-02 DIAGNOSIS — R7989 Other specified abnormal findings of blood chemistry: Secondary | ICD-10-CM | POA: Insufficient documentation

## 2023-05-02 DIAGNOSIS — I10 Essential (primary) hypertension: Secondary | ICD-10-CM | POA: Diagnosis present

## 2023-05-02 DIAGNOSIS — Z885 Allergy status to narcotic agent status: Secondary | ICD-10-CM

## 2023-05-02 DIAGNOSIS — N179 Acute kidney failure, unspecified: Secondary | ICD-10-CM | POA: Diagnosis present

## 2023-05-02 DIAGNOSIS — Z7982 Long term (current) use of aspirin: Secondary | ICD-10-CM

## 2023-05-02 DIAGNOSIS — Z833 Family history of diabetes mellitus: Secondary | ICD-10-CM

## 2023-05-02 DIAGNOSIS — Z888 Allergy status to other drugs, medicaments and biological substances status: Secondary | ICD-10-CM

## 2023-05-02 DIAGNOSIS — J9601 Acute respiratory failure with hypoxia: Secondary | ICD-10-CM | POA: Diagnosis present

## 2023-05-02 DIAGNOSIS — M79672 Pain in left foot: Secondary | ICD-10-CM | POA: Diagnosis present

## 2023-05-02 DIAGNOSIS — J189 Pneumonia, unspecified organism: Principal | ICD-10-CM | POA: Insufficient documentation

## 2023-05-02 DIAGNOSIS — E785 Hyperlipidemia, unspecified: Secondary | ICD-10-CM | POA: Diagnosis present

## 2023-05-02 DIAGNOSIS — U099 Post covid-19 condition, unspecified: Secondary | ICD-10-CM | POA: Diagnosis present

## 2023-05-02 DIAGNOSIS — R252 Cramp and spasm: Secondary | ICD-10-CM | POA: Diagnosis present

## 2023-05-02 DIAGNOSIS — R001 Bradycardia, unspecified: Secondary | ICD-10-CM | POA: Diagnosis present

## 2023-05-02 DIAGNOSIS — M1990 Unspecified osteoarthritis, unspecified site: Secondary | ICD-10-CM | POA: Diagnosis present

## 2023-05-02 DIAGNOSIS — Z8349 Family history of other endocrine, nutritional and metabolic diseases: Secondary | ICD-10-CM

## 2023-05-02 LAB — TROPONIN I (HIGH SENSITIVITY)
Troponin I (High Sensitivity): 25 ng/L — ABNORMAL HIGH (ref <18)
Troponin I (High Sensitivity): 24 ng/L — ABNORMAL HIGH (ref <18)

## 2023-05-02 LAB — CBC WITH DIFFERENTIAL/PLATELET
Abs Immature Granulocytes: 0.03 10*3/uL (ref 0.00–0.07)
Basophils Absolute: 0 10*3/uL (ref 0.0–0.1)
Basophils Relative: 1 %
Eosinophils Absolute: 0.3 10*3/uL (ref 0.0–0.5)
Eosinophils Relative: 3 %
HCT: 31.1 % — ABNORMAL LOW (ref 36.0–46.0)
Hemoglobin: 10.3 g/dL — ABNORMAL LOW (ref 12.0–15.0)
Immature Granulocytes: 0 %
Lymphocytes Relative: 20 %
Lymphs Abs: 1.7 10*3/uL (ref 0.7–4.0)
MCH: 29.9 pg (ref 26.0–34.0)
MCHC: 33.1 g/dL (ref 30.0–36.0)
MCV: 90.4 fL (ref 80.0–100.0)
Monocytes Absolute: 1 10*3/uL (ref 0.1–1.0)
Monocytes Relative: 11 %
Neutro Abs: 5.5 10*3/uL (ref 1.7–7.7)
Neutrophils Relative %: 65 %
Platelets: 282 10*3/uL (ref 150–400)
RBC: 3.44 MIL/uL — ABNORMAL LOW (ref 3.87–5.11)
RDW: 12.3 % (ref 11.5–15.5)
WBC: 8.5 10*3/uL (ref 4.0–10.5)
nRBC: 0 % (ref 0.0–0.2)

## 2023-05-02 LAB — RESP PANEL BY RT-PCR (RSV, FLU A&B, COVID)  RVPGX2
Influenza A by PCR: NEGATIVE
Influenza B by PCR: NEGATIVE
Resp Syncytial Virus by PCR: NEGATIVE
SARS Coronavirus 2 by RT PCR: NEGATIVE

## 2023-05-02 LAB — COMPREHENSIVE METABOLIC PANEL
ALT: 13 U/L (ref 0–44)
AST: 19 U/L (ref 15–41)
Albumin: 3.3 g/dL — ABNORMAL LOW (ref 3.5–5.0)
Alkaline Phosphatase: 74 U/L (ref 38–126)
Anion gap: 11 (ref 5–15)
BUN: 25 mg/dL — ABNORMAL HIGH (ref 8–23)
CO2: 24 mmol/L (ref 22–32)
Calcium: 9.4 mg/dL (ref 8.9–10.3)
Chloride: 100 mmol/L (ref 98–111)
Creatinine, Ser: 1.29 mg/dL — ABNORMAL HIGH (ref 0.44–1.00)
GFR, Estimated: 42 mL/min — ABNORMAL LOW (ref >60)
Glucose, Bld: 100 mg/dL — ABNORMAL HIGH (ref 70–99)
Potassium: 3.6 mmol/L (ref 3.5–5.1)
Sodium: 135 mmol/L (ref 135–145)
Total Bilirubin: 0.7 mg/dL (ref 0.3–1.2)
Total Protein: 7.1 g/dL (ref 6.5–8.1)

## 2023-05-02 MED ORDER — CLONAZEPAM 0.5 MG PO TABS
1.0000 mg | ORAL_TABLET | Freq: Two times a day (BID) | ORAL | Status: DC | PRN
  Administered 2023-05-07: 1 mg via ORAL
  Filled 2023-05-02: qty 2

## 2023-05-02 MED ORDER — ALBUTEROL SULFATE (2.5 MG/3ML) 0.083% IN NEBU: 2.5000 mg | INHALATION_SOLUTION | RESPIRATORY_TRACT | Status: DC | PRN

## 2023-05-02 MED ORDER — OXYCODONE HCL 5 MG PO TABS
5.0000 mg | ORAL_TABLET | ORAL | Status: DC | PRN
  Administered 2023-05-04 – 2023-05-07 (×11): 5 mg via ORAL
  Filled 2023-05-02 (×12): qty 1

## 2023-05-02 MED ORDER — ACETAMINOPHEN 325 MG PO TABS: 650.0000 mg | ORAL_TABLET | Freq: Four times a day (QID) | ORAL | Status: DC | PRN

## 2023-05-02 MED ORDER — SODIUM CHLORIDE 0.9 % IV SOLN
1.0000 g | Freq: Once | INTRAVENOUS | Status: AC
  Administered 2023-05-02: 1 g via INTRAVENOUS
  Filled 2023-05-02: qty 10

## 2023-05-02 MED ORDER — IPRATROPIUM-ALBUTEROL 0.5-2.5 (3) MG/3ML IN SOLN
3.0000 mL | Freq: Once | RESPIRATORY_TRACT | Status: AC
  Administered 2023-05-02: 3 mL via RESPIRATORY_TRACT
  Filled 2023-05-02: qty 3

## 2023-05-02 MED ORDER — SODIUM CHLORIDE 0.9 % IV SOLN
2.0000 g | INTRAVENOUS | Status: DC
  Administered 2023-05-03 – 2023-05-06 (×4): 2 g via INTRAVENOUS
  Filled 2023-05-02 (×5): qty 20

## 2023-05-02 MED ORDER — SODIUM CHLORIDE 0.9 % IV SOLN
500.0000 mg | INTRAVENOUS | Status: DC
  Administered 2023-05-03 – 2023-05-04 (×2): 500 mg via INTRAVENOUS
  Filled 2023-05-02 (×2): qty 5

## 2023-05-02 MED ORDER — ONDANSETRON HCL 4 MG/2ML IJ SOLN: 4.0000 mg | Freq: Four times a day (QID) | INTRAMUSCULAR | Status: DC | PRN

## 2023-05-02 MED ORDER — HEPARIN SODIUM (PORCINE) 5000 UNIT/ML IJ SOLN
5000.0000 [IU] | Freq: Three times a day (TID) | INTRAMUSCULAR | Status: DC
  Administered 2023-05-03 – 2023-05-08 (×15): 5000 [IU] via SUBCUTANEOUS
  Filled 2023-05-02 (×15): qty 1

## 2023-05-02 MED ORDER — ONDANSETRON HCL 4 MG PO TABS: 4.0000 mg | ORAL_TABLET | Freq: Four times a day (QID) | ORAL | Status: DC | PRN

## 2023-05-02 MED ORDER — AZITHROMYCIN 250 MG PO TABS
500.0000 mg | ORAL_TABLET | Freq: Once | ORAL | Status: AC
  Administered 2023-05-02: 500 mg via ORAL
  Filled 2023-05-02: qty 2

## 2023-05-02 MED ORDER — ONDANSETRON HCL 4 MG/2ML IJ SOLN
4.0000 mg | Freq: Once | INTRAMUSCULAR | Status: AC
  Administered 2023-05-02: 4 mg via INTRAVENOUS
  Filled 2023-05-02: qty 2

## 2023-05-02 MED ORDER — CITALOPRAM HYDROBROMIDE 20 MG PO TABS
40.0000 mg | ORAL_TABLET | Freq: Every day | ORAL | Status: DC
  Administered 2023-05-03 – 2023-05-08 (×6): 40 mg via ORAL
  Filled 2023-05-02 (×6): qty 2

## 2023-05-02 MED ORDER — ACETAMINOPHEN 650 MG RE SUPP: 650.0000 mg | Freq: Four times a day (QID) | RECTAL | Status: DC | PRN

## 2023-05-02 MED ORDER — SODIUM CHLORIDE 0.9 % IV BOLUS
500.0000 mL | Freq: Once | INTRAVENOUS | Status: AC
  Administered 2023-05-02: 500 mL via INTRAVENOUS

## 2023-05-02 NOTE — ED Notes (Signed)
ED TO INPATIENT HANDOFF REPORT  ED Nurse Name and Phone #:   S Name/Age/Gender Burt Ek 82 y.o. female Room/Bed: APA08/APA08  Code Status   Code Status: Full Code  Home/SNF/Other Home Patient oriented to: self, place, time, and situation Is this baseline? Yes   Triage Complete: Triage complete  Chief Complaint Acute respiratory failure with hypoxia (HCC) [J96.01]  Triage Note Pt presents with SOB, discharged from [facility] 04/24/23, per pt she continues to be SOB.   Allergies Allergies  Allergen Reactions   Codeine Hives and Swelling   Promethazine Hcl     Other Reaction(s): swell up    Level of Care/Admitting Diagnosis ED Disposition     ED Disposition  Admit   Condition  --   Comment  Hospital Area: [facility] [[id number]]  Level of Care: Telemetry [5]  Covid Evaluation: Recent COVID positive no isolation required infection day 21-90  Diagnosis: Acute respiratory failure with hypoxia [facility]) [[id number]]  Admitting Physician: Lilyan Gilford [[id number]]  Attending Physician: Lilyan Gilford [[id number]]          B Medical/Surgery History Past Medical History:  Diagnosis Date   Anemia    Collagenous colitis    colonoscopy 2009   Depression    Fibromyalgia    GERD (gastroesophageal reflux disease)    Hiatal hernia 2008   small   Hypercholesterolemia    Hypertension    IBS (irritable bowel syndrome)    Lymphocytic colitis    colonoscopy 2007, TCS 2013   S/P endoscopy Sept 2008   small hh, s/p 17 French dilator   Past Surgical History:  Procedure Laterality Date   ABDOMINAL HYSTERECTOMY     BIOPSY  01/09/2017   Procedure: BIOPSY;  Surgeon: Corbin Ade, MD;  Location: [facility];  Service: Endoscopy;;  colon   CATARACT EXTRACTION Bilateral 2021   COLONOSCOPY  04/13/2008   Dr.Rourk- anal papilla and internal hemorrhage otherwise normal /left side diverticula   COLONOSCOPY  09/20/2011   Dr.Rourk- Adequate preparation. Internal  hemorrhoids; otherwise normal/ Left-sided diverticula, biopsies positive for lymphocytic colitis   COLONOSCOPY N/A 01/09/2017   Procedure: COLONOSCOPY;  Surgeon: Corbin Ade, MD;  Location: [facility];  Service: Endoscopy;  Laterality: N/A;  7:30am   ESOPHAGOGASTRODUODENOSCOPY  04/22/2007   Dr.Rourk- normal esophagus, small hiatal hernia o/w normal stomach, D1 and D2 s/p passage of a 56-French maloney dilator   fistula of colon     FLEXIBLE SIGMOIDOSCOPY  05/29/2006   Dr.Rehman- No evidence of pseudomembranous or acute colitis.   FOOT SURGERY     KNEE SURGERY     NOSE SURGERY     VEIN SURGERY Bilateral 2021     A IV Location/Drains/Wounds Patient Lines/Drains/Airways Status     Active Line/Drains/Airways     Name Placement date Placement time Site Days   Peripheral IV 05/02/23 20 G Right Antecubital 05/02/23  1900  Antecubital  less than 1            Intake/Output Last 24 hours No intake or output data in the 24 hours ending 05/02/23 2218  Labs/Imaging Results for orders placed or performed during the hospital encounter of 05/02/23 (from the past 48 hour(s))  CBC with Differential     Status: Abnormal   Collection Time: 05/02/23  2:08 PM  Result Value Ref Range   WBC 8.5 4.0 - 10.5 K/uL   RBC 3.44 (L) 3.87 - 5.11 MIL/uL   Hemoglobin 10.3 (L) 12.0 - 15.0  g/dL   HCT 30.8 (L) 65.7 - 84.6 %   MCV 90.4 80.0 - 100.0 fL   MCH 29.9 26.0 - 34.0 pg   MCHC 33.1 30.0 - 36.0 g/dL   RDW 96.2 95.2 - 84.1 %   Platelets 282 150 - 400 K/uL   nRBC 0.0 0.0 - 0.2 %   Neutrophils Relative % 65 %   Neutro Abs 5.5 1.7 - 7.7 K/uL   Lymphocytes Relative 20 %   Lymphs Abs 1.7 0.7 - 4.0 K/uL   Monocytes Relative 11 %   Monocytes Absolute 1.0 0.1 - 1.0 K/uL   Eosinophils Relative 3 %   Eosinophils Absolute 0.3 0.0 - 0.5 K/uL   Basophils Relative 1 %   Basophils Absolute 0.0 0.0 - 0.1 K/uL   Immature Granulocytes 0 %   Abs Immature Granulocytes 0.03 0.00 - 0.07 K/uL    Comment:  Performed at [facility], [address], [place], [place] [address]  Comprehensive metabolic panel     Status: Abnormal   Collection Time: 05/02/23  2:08 PM  Result Value Ref Range   Sodium 135 135 - 145 mmol/L   Potassium 3.6 3.5 - 5.1 mmol/L   Chloride 100 98 - 111 mmol/L   CO2 24 22 - 32 mmol/L   Glucose, Bld 100 (H) 70 - 99 mg/dL    Comment: Glucose reference range applies only to samples taken after fasting for at least 8 hours.   BUN 25 (H) 8 - 23 mg/dL   Creatinine, Ser 1.02 (H) 0.44 - 1.00 mg/dL   Calcium 9.4 8.9 - 72.5 mg/dL   Total Protein 7.1 6.5 - 8.1 g/dL   Albumin 3.3 (L) 3.5 - 5.0 g/dL   AST 19 15 - 41 U/L   ALT 13 0 - 44 U/L   Alkaline Phosphatase 74 38 - 126 U/L   Total Bilirubin 0.7 0.3 - 1.2 mg/dL   GFR, Estimated 42 (L) >60 mL/min    Comment: (NOTE) Calculated using the CKD-EPI Creatinine Equation (2021)    Anion gap 11 5 - 15    Comment: Performed at [facility], [address] [place], [place] [address]  Troponin I (High Sensitivity)     Status: Abnormal   Collection Time: 05/02/23  2:08 PM  Result Value Ref Range   Troponin I (High Sensitivity) 25 (H) <18 ng/L    Comment: (NOTE) Elevated high sensitivity troponin I (hsTnI) values and significant  changes across serial measurements may suggest ACS but many other  chronic and acute conditions are known to elevate hsTnI results.  Refer to the "Links" section for chest pain algorithms and additional  guidance. Performed at [facility], [address], [place], [place] [address]   Resp panel by RT-PCR (RSV, Flu A&B, Covid) Anterior Nasal Swab     Status: None   Collection Time: 05/02/23  3:37 PM   Specimen: Anterior Nasal Swab  Result Value Ref Range   SARS Coronavirus 2 by RT PCR NEGATIVE NEGATIVE    Comment: (NOTE) SARS-CoV-2 target nucleic acids are NOT DETECTED.  The SARS-CoV-2 RNA is generally detectable in upper respiratory specimens during the acute phase of infection. The lowest concentration  of SARS-CoV-2 viral copies this assay can detect is 138 copies/mL. A negative result does not preclude SARS-Cov-2 infection and should not be used as the sole basis for treatment or other patient management decisions. A negative result may occur with  improper specimen collection/handling, submission of specimen other than nasopharyngeal  swab, presence of viral mutation(s) within the areas targeted by this assay, and inadequate number of viral copies(<138 copies/mL). A negative result must be combined with clinical observations, patient history, and epidemiological information. The expected result is Negative.  Fact Sheet for Patients:  BloggerCourse.com  Fact Sheet for Healthcare Providers:  SeriousBroker.it  This test is no t yet approved or cleared by the [place] FDA and  has been authorized for detection and/or diagnosis of SARS-CoV-2 by FDA under an Emergency Use Authorization (EUA). This EUA will remain  in effect (meaning this test can be used) for the duration of the COVID-19 declaration under Section 564(b)(1) of the Act, 21 U.S.C.section 360bbb-3(b)(1), unless the authorization is terminated  or revoked sooner.       Influenza A by PCR NEGATIVE NEGATIVE   Influenza B by PCR NEGATIVE NEGATIVE    Comment: (NOTE) The Xpert Xpress SARS-CoV-2/FLU/RSV plus assay is intended as an aid in the diagnosis of influenza from Nasopharyngeal swab specimens and should not be used as a sole basis for treatment. Nasal washings and aspirates are unacceptable for Xpert Xpress SARS-CoV-2/FLU/RSV testing.  Fact Sheet for Patients: BloggerCourse.com  Fact Sheet for Healthcare Providers: SeriousBroker.it  This test is not yet approved or cleared by the [place] FDA and has been authorized for detection and/or diagnosis of SARS-CoV-2 by FDA under an Emergency Use Authorization  (EUA). This EUA will remain in effect (meaning this test can be used) for the duration of the COVID-19 declaration under Section 564(b)(1) of the Act, 21 U.S.C. section 360bbb-3(b)(1), unless the authorization is terminated or revoked.     Resp Syncytial Virus by PCR NEGATIVE NEGATIVE    Comment: (NOTE) Fact Sheet for Patients: BloggerCourse.com  Fact Sheet for Healthcare Providers: SeriousBroker.it  This test is not yet approved or cleared by the [place] FDA and has been authorized for detection and/or diagnosis of SARS-CoV-2 by FDA under an Emergency Use Authorization (EUA). This EUA will remain in effect (meaning this test can be used) for the duration of the COVID-19 declaration under Section 564(b)(1) of the Act, 21 U.S.C. section 360bbb-3(b)(1), unless the authorization is terminated or revoked.  Performed at [facility], [address] [place], [place] [address]   Troponin I (High Sensitivity)     Status: Abnormal   Collection Time: 05/02/23  5:04 PM  Result Value Ref Range   Troponin I (High Sensitivity) 24 (H) <18 ng/L    Comment: (NOTE) Elevated high sensitivity troponin I (hsTnI) values and significant  changes across serial measurements may suggest ACS but many other  chronic and acute conditions are known to elevate hsTnI results.  Refer to the "Links" section for chest pain algorithms and additional  guidance. Performed at [facility], [address], [place], [place] [address]    DG Chest 2 View  Result Date: 05/02/2023 CLINICAL DATA:  Shortness of breath. EXAM: CHEST - 2 VIEW COMPARISON:  04/24/2023 FINDINGS: Midline trachea. Normal heart size. Atherosclerosis in the transverse aorta. No pleural effusion or pneumothorax. Basilar predominant, bilateral interstitial thickening is new or significantly progressive. No well-defined lobar consolidation. Degenerative changes of the right greater than left  shoulders. IMPRESSION: Moderate lower lung predominant interstitial thickening is new or increased and suspicious for viral or atypical/mycoplasma pneumonia. Mild pulmonary venous congestion is felt less likely, given lack of pleural fluid. Aortic Atherosclerosis (ICD10-I70.0). Electronically Signed   By: Jeronimo Greaves M.D.   On: 05/02/2023 16:13    Pending Labs Unresulted Labs (From admission,  onward)     Start     Ordered   Signed and Held  Expectorated Sputum Assessment w Gram Stain, Rflx to [employer]  (COPD / Pneumonia / Cellulitis / Lower Extremity Wound)  Once,   R        Signed and Held   Signed and Held  Legionella Pneumophila Serogp 1 Ur Ag  (COPD / Pneumonia / Cellulitis / Lower Extremity Wound)  Once,   R        Signed and Held   Signed and Held  Strep pneumoniae urinary antigen  (COPD / Pneumonia / Cellulitis / Lower Extremity Wound)  Once,   R        Signed and Held   Signed and Held  Expectorated Sputum Assessment w Gram Stain, Rflx to [employer]  (COPD / Pneumonia / Cellulitis / Lower Extremity Wound)  Once,   R        Signed and Held   Signed and Held  Comprehensive metabolic panel  Tomorrow morning,   R        Signed and Held   Signed and Held  Magnesium  Tomorrow morning,   R        Signed and Held   Signed and Held  CBC with Differential/Platelet  Tomorrow morning,   R        Signed and Held            Vitals/Pain Today's Vitals   05/02/23 1818 05/02/23 1818 05/02/23 2015 05/02/23 2200  BP:  (!) 144/50 (!) 130/47 (!) 115/50  Pulse:  82 82 75  Resp:  (!) 22 20 20   Temp: 98.1 F (36.7 C) 98.1 F (36.7 C)  98.3 F (36.8 C)  TempSrc: Oral Oral  Oral  SpO2:  97% 96% 96%  Weight:      Height:      PainSc:        Isolation Precautions No active isolations  Medications Medications  ipratropium-albuterol (DUONEB) 0.5-2.5 (3) MG/3ML nebulizer solution 3 mL (3 mLs Nebulization Given 05/02/23 1621)  azithromycin (ZITHROMAX) tablet 500 mg (500 mg Oral Given  05/02/23 1812)  sodium chloride 0.9 % bolus 500 mL (0 mLs Intravenous Stopped 05/02/23 2217)  ondansetron (ZOFRAN) injection 4 mg (4 mg Intravenous Given 05/02/23 1911)  cefTRIAXone (ROCEPHIN) 1 g in sodium chloride 0.9 % 100 mL IVPB (0 g Intravenous Stopped 05/02/23 2218)    Mobility walks with device     Focused Assessments    R Recommendations: See Admitting Provider Note  Report given to:   Additional Notes:

## 2023-05-02 NOTE — ED Provider Notes (Signed)
[place] EMERGENCY DEPARTMENT AT [facility] Provider Note   CSN: [id number] Arrival date & time: 05/02/23  1315     History  Chief Complaint  Patient presents with   Shortness of Breath    Marie [name] is a 82 y.o. female.  82 year old female with past medical history of hypertension and hyperlipidemia presents the emergency department today with cough and shortness of breath.  Patient states she has been having a cough and dyspnea on exertion now since Monday.  She reports that she is coughing and was initially producing some sputum.  She reports she has not really been producing much sputum over the past few days.  She denies any fevers at home.  Denies any associated chest pain.  She states that she was diagnosed with COVID-19 last month and has had a persistent cough since then.  It seems that is getting worse over the past week.  She denies any history of COPD or asthma but is on Pulmicort.   Shortness of Breath Associated symptoms: cough        Home Medications Prior to Admission medications   Medication Sig Start Date End Date Taking? Authorizing Provider  nitrofurantoin, macrocrystal-monohydrate, (MACROBID) 100 MG capsule Take 100 mg by mouth 2 (two) times daily. 04/26/23  Yes [provider]  Ascorbic Acid (VITAMIN C PO) Take 1 tablet by mouth every morning.    [provider]  budesonide (PULMICORT) 0.25 MG/2ML nebulizer solution Take 2 mLs by nebulization daily. 01/03/23   [provider]  CALCIUM PO Take 1 tablet by mouth every morning.    [provider]  citalopram (CELEXA) 40 MG tablet Take 40 mg by mouth daily. 12/19/20   [provider]  clonazePAM (KLONOPIN) 1 MG tablet Take 1 tablet (1 mg total) by mouth daily as needed for anxiety. 04/25/23   Catarina Hartshorn, MD  esomeprazole (NEXIUM) 40 MG capsule Take 40 mg by mouth daily.     [provider]  hydrochlorothiazide (HYDRODIURIL) 12.5 MG tablet Take 12.5  mg by mouth daily. 12/19/20   [provider]  hyoscyamine (LEVSIN SL) 0.125 MG SL tablet Take 0.25 mg by mouth 3 (three) times daily as needed for cramping. 04/25/23   [provider]  hyoscyamine (LEVSIN) 0.125 MG/ML solution Take 2 mLs (0.25 mg total) by mouth 3 (three) times daily. 04/25/23   Catarina Hartshorn, MD  ipratropium (ATROVENT) 0.02 % nebulizer solution Take 2.5 mLs by nebulization every 6 (six) hours as needed for wheezing or shortness of breath. 02/05/23   [provider]  losartan (COZAAR) 50 MG tablet Take 50 mg by mouth every morning.    [provider]  Multiple Vitamins-Minerals (CENTRUM SILVER ULTRA WOMENS PO) Take 1 tablet by mouth every morning.    [provider]  Omega-3 Fatty Acids (FISH OIL) 1000 MG CAPS Take 2 capsules by mouth daily.    [provider]  traMADol (ULTRAM) 50 MG tablet Take 50 mg by mouth every 6 (six) hours as needed. For pain    [provider]  vitamin B-12 (CYANOCOBALAMIN) 1000 MCG tablet Take 1,000 mcg by mouth daily.    [provider]      Allergies    Codeine and Promethazine hcl    Review of Systems   Review of Systems  Respiratory:  Positive for cough and shortness of breath.   All other systems reviewed and are negative.   Physical Exam Updated Vital Signs BP (!) 144/50 (  BP Location: Left Arm)   Pulse 82   Temp 98.1 F (36.7 C) (Oral)   Resp (!) 22   Ht 5\' 3"  (1.6 m)   Wt 65.3 kg   SpO2 97%   BMI 25.50 kg/m  Physical Exam Vitals and nursing note reviewed.   Gen: NAD Eyes: PERRL, EOMI HEENT: no oropharyngeal swelling Neck: trachea midline Resp: Coarse breath sounds noted at left lung base with some scattered minimal wheezes throughout Card: RRR, no murmurs, rubs, or gallops Abd: nontender, nondistended Extremities: no calf tenderness, no edema Vascular: 2+ radial pulses bilaterally, 2+ DP pulses bilaterally Skin: no rashes Psyc: acting appropriately   ED  Results / Procedures / Treatments   Labs (all labs ordered are listed, but only abnormal results are displayed) Labs Reviewed  CBC WITH DIFFERENTIAL/PLATELET - Abnormal; Notable for the following components:      Result Value   RBC 3.44 (*)    Hemoglobin 10.3 (*)    HCT 31.1 (*)    All other components within normal limits  COMPREHENSIVE METABOLIC PANEL - Abnormal; Notable for the following components:   Glucose, Bld 100 (*)    BUN 25 (*)    Creatinine, Ser 1.29 (*)    Albumin 3.3 (*)    GFR, Estimated 42 (*)    All other components within normal limits  TROPONIN I (HIGH SENSITIVITY) - Abnormal; Notable for the following components:   Troponin I (High Sensitivity) 25 (*)    All other components within normal limits  TROPONIN I (HIGH SENSITIVITY) - Abnormal; Notable for the following components:   Troponin I (High Sensitivity) 24 (*)    All other components within normal limits  RESP PANEL BY RT-PCR (RSV, FLU A&B, COVID)  [id number]    EKG EKG Interpretation Date/Time:  Thursday May 02 2023 13:46:36 EDT Ventricular Rate:  59 PR Interval:  126 QRS Duration:  86 QT Interval:  426 QTC Calculation: 421 R Axis:   65  Text Interpretation: Sinus bradycardia with sinus arrhythmia Nonspecific ST and T wave abnormality Abnormal ECG When compared with ECG of 24-Apr-2023 15:52, PREVIOUS ECG IS PRESENT Confirmed by Beckey Downing [id number]) on 05/02/2023 5:32:21 PM  Radiology DG Chest 2 View  Result Date: 05/02/2023 CLINICAL DATA:  Shortness of breath. EXAM: CHEST - 2 VIEW COMPARISON:  04/24/2023 FINDINGS: Midline trachea. Normal heart size. Atherosclerosis in the transverse aorta. No pleural effusion or pneumothorax. Basilar predominant, bilateral interstitial thickening is new or significantly progressive. No well-defined lobar consolidation. Degenerative changes of the right greater than left shoulders. IMPRESSION: Moderate lower lung predominant interstitial thickening is new or increased  and suspicious for viral or atypical/mycoplasma pneumonia. Mild pulmonary venous congestion is felt less likely, given lack of pleural fluid. Aortic Atherosclerosis (ICD10-I70.0). Electronically Signed   By: Jeronimo Greaves M.D.   On: 05/02/2023 16:13    Procedures Procedures    Medications Ordered in ED Medications  sodium chloride 0.9 % bolus 500 mL (has no administration in time range)  ondansetron (ZOFRAN) injection 4 mg (has no administration in time range)  cefTRIAXone (ROCEPHIN) 1 g in sodium chloride 0.9 % 100 mL IVPB (has no administration in time range)  ipratropium-albuterol (DUONEB) 0.5-2.5 (3) MG/3ML nebulizer solution 3 mL (3 mLs Nebulization Given 05/02/23 1621)  azithromycin (ZITHROMAX) tablet 500 mg (500 mg Oral Given 05/02/23 1812)    ED Course/ Medical Decision Making/ A&P  Medical Decision Making 82 year old female with past medical history of hypertension hyperlipidemia and questionable reactive airway disease presents the emergency department today with cough and dyspnea on exertion.  Further by the patient here with basic labs as well as chest x-ray here to evaluate for pulmonary edema, pulmonary infiltrates, or pneumothorax.  Give the patient a DuoNeb here.  Will also obtain a COVID and flu swab on the patient.  I will reevaluate for ultimate disposition.  The patient's labs are largely unremarkable.  Her EKG does not show any ischemic changes on my read.  Her troponin is mildly elevated but stable here.  Her chest x-ray shows findings consistent with atypical pneumonia.  She does not meet SIRS criteria at this time.  She is initially given azithromycin and we did try to ambulate the patient for ambulatory pulse ox.  Before even getting out of bed her pulse ox dropped to the 80s and she was dyspneic.  She is covered with Rocephin in addition to the azithromycin and a call was placed to hospitalist service for admission for pneumonia.  She is  stable on 2 L nasal cannula.  Amount and/or Complexity of Data Reviewed Labs: ordered. Radiology: ordered.  Risk Prescription drug management.           Final Clinical Impression(s) / ED Diagnoses Final diagnoses:  Pneumonia due to infectious organism, unspecified laterality, unspecified part of lung  Hypoxia    Rx / DC Orders ED Discharge Orders     None         Durwin Glaze, MD 05/02/23 1900

## 2023-05-02 NOTE — ED Triage Notes (Signed)
Pt presents with SOB, discharged from [facility] 04/24/23, per pt she continues to be SOB.

## 2023-05-02 NOTE — ED Notes (Signed)
tried to get her up to walk, she says she is hurting upper left chest and was getting dizzy and felt like she was gonna throw up so she wanted to lay back down   EDP notified

## 2023-05-03 DIAGNOSIS — Z888 Allergy status to other drugs, medicaments and biological substances status: Secondary | ICD-10-CM | POA: Diagnosis not present

## 2023-05-03 DIAGNOSIS — J189 Pneumonia, unspecified organism: Secondary | ICD-10-CM | POA: Insufficient documentation

## 2023-05-03 DIAGNOSIS — M1990 Unspecified osteoarthritis, unspecified site: Secondary | ICD-10-CM | POA: Diagnosis present

## 2023-05-03 DIAGNOSIS — U099 Post covid-19 condition, unspecified: Secondary | ICD-10-CM | POA: Diagnosis present

## 2023-05-03 DIAGNOSIS — Z8249 Family history of ischemic heart disease and other diseases of the circulatory system: Secondary | ICD-10-CM | POA: Diagnosis not present

## 2023-05-03 DIAGNOSIS — J9601 Acute respiratory failure with hypoxia: Secondary | ICD-10-CM | POA: Diagnosis present

## 2023-05-03 DIAGNOSIS — Z8349 Family history of other endocrine, nutritional and metabolic diseases: Secondary | ICD-10-CM | POA: Diagnosis not present

## 2023-05-03 DIAGNOSIS — N179 Acute kidney failure, unspecified: Secondary | ICD-10-CM | POA: Diagnosis present

## 2023-05-03 DIAGNOSIS — I951 Orthostatic hypotension: Secondary | ICD-10-CM | POA: Diagnosis present

## 2023-05-03 DIAGNOSIS — I10 Essential (primary) hypertension: Secondary | ICD-10-CM | POA: Diagnosis present

## 2023-05-03 DIAGNOSIS — Z8616 Personal history of COVID-19: Secondary | ICD-10-CM | POA: Diagnosis not present

## 2023-05-03 DIAGNOSIS — R7989 Other specified abnormal findings of blood chemistry: Secondary | ICD-10-CM | POA: Insufficient documentation

## 2023-05-03 DIAGNOSIS — R55 Syncope and collapse: Secondary | ICD-10-CM | POA: Diagnosis present

## 2023-05-03 DIAGNOSIS — E860 Dehydration: Secondary | ICD-10-CM | POA: Diagnosis present

## 2023-05-03 DIAGNOSIS — R001 Bradycardia, unspecified: Secondary | ICD-10-CM | POA: Diagnosis present

## 2023-05-03 DIAGNOSIS — Z79899 Other long term (current) drug therapy: Secondary | ICD-10-CM | POA: Diagnosis not present

## 2023-05-03 DIAGNOSIS — Z885 Allergy status to narcotic agent status: Secondary | ICD-10-CM | POA: Diagnosis not present

## 2023-05-03 DIAGNOSIS — R053 Chronic cough: Secondary | ICD-10-CM | POA: Diagnosis present

## 2023-05-03 DIAGNOSIS — M79672 Pain in left foot: Secondary | ICD-10-CM | POA: Diagnosis present

## 2023-05-03 DIAGNOSIS — Z87440 Personal history of urinary (tract) infections: Secondary | ICD-10-CM | POA: Diagnosis not present

## 2023-05-03 DIAGNOSIS — Z833 Family history of diabetes mellitus: Secondary | ICD-10-CM | POA: Diagnosis not present

## 2023-05-03 DIAGNOSIS — E785 Hyperlipidemia, unspecified: Secondary | ICD-10-CM | POA: Diagnosis present

## 2023-05-03 DIAGNOSIS — Z7982 Long term (current) use of aspirin: Secondary | ICD-10-CM | POA: Diagnosis not present

## 2023-05-03 DIAGNOSIS — Z87891 Personal history of nicotine dependence: Secondary | ICD-10-CM | POA: Diagnosis not present

## 2023-05-03 DIAGNOSIS — I2489 Other forms of acute ischemic heart disease: Secondary | ICD-10-CM | POA: Diagnosis present

## 2023-05-03 DIAGNOSIS — Z7951 Long term (current) use of inhaled steroids: Secondary | ICD-10-CM | POA: Diagnosis not present

## 2023-05-03 DIAGNOSIS — J13 Pneumonia due to Streptococcus pneumoniae: Secondary | ICD-10-CM | POA: Diagnosis present

## 2023-05-03 LAB — COMPREHENSIVE METABOLIC PANEL
ALT: 11 U/L (ref 0–44)
AST: 15 U/L (ref 15–41)
Albumin: 2.6 g/dL — ABNORMAL LOW (ref 3.5–5.0)
Alkaline Phosphatase: 61 U/L (ref 38–126)
Anion gap: 9 (ref 5–15)
BUN: 21 mg/dL (ref 8–23)
CO2: 25 mmol/L (ref 22–32)
Calcium: 8.7 mg/dL — ABNORMAL LOW (ref 8.9–10.3)
Chloride: 101 mmol/L (ref 98–111)
Creatinine, Ser: 0.96 mg/dL (ref 0.44–1.00)
GFR, Estimated: 60 mL/min — ABNORMAL LOW (ref >60)
Glucose, Bld: 109 mg/dL — ABNORMAL HIGH (ref 70–99)
Potassium: 3.5 mmol/L (ref 3.5–5.1)
Sodium: 135 mmol/L (ref 135–145)
Total Bilirubin: 0.5 mg/dL (ref 0.3–1.2)
Total Protein: 5.8 g/dL — ABNORMAL LOW (ref 6.5–8.1)

## 2023-05-03 LAB — CBC WITH DIFFERENTIAL/PLATELET
Abs Immature Granulocytes: 0.09 10*3/uL — ABNORMAL HIGH (ref 0.00–0.07)
Basophils Absolute: 0.1 10*3/uL (ref 0.0–0.1)
Basophils Relative: 0 %
Eosinophils Absolute: 0.2 10*3/uL (ref 0.0–0.5)
Eosinophils Relative: 1 %
HCT: 27 % — ABNORMAL LOW (ref 36.0–46.0)
Hemoglobin: 8.6 g/dL — ABNORMAL LOW (ref 12.0–15.0)
Immature Granulocytes: 1 %
Lymphocytes Relative: 17 %
Lymphs Abs: 2 10*3/uL (ref 0.7–4.0)
MCH: 28.9 pg (ref 26.0–34.0)
MCHC: 31.9 g/dL (ref 30.0–36.0)
MCV: 90.6 fL (ref 80.0–100.0)
Monocytes Absolute: 0.9 10*3/uL (ref 0.1–1.0)
Monocytes Relative: 7 %
Neutro Abs: 8.8 10*3/uL — ABNORMAL HIGH (ref 1.7–7.7)
Neutrophils Relative %: 74 %
Platelets: 231 10*3/uL (ref 150–400)
RBC: 2.98 MIL/uL — ABNORMAL LOW (ref 3.87–5.11)
RDW: 12.1 % (ref 11.5–15.5)
WBC: 12 10*3/uL — ABNORMAL HIGH (ref 4.0–10.5)
nRBC: 0 % (ref 0.0–0.2)

## 2023-05-03 LAB — STREP PNEUMONIAE URINARY ANTIGEN: Strep Pneumo Urinary Antigen: NEGATIVE

## 2023-05-03 LAB — MAGNESIUM: Magnesium: 1.7 mg/dL (ref 1.7–2.4)

## 2023-05-03 MED ORDER — GUAIFENESIN ER 600 MG PO TB12
600.0000 mg | ORAL_TABLET | Freq: Two times a day (BID) | ORAL | Status: DC
  Administered 2023-05-03 – 2023-05-08 (×10): 600 mg via ORAL
  Filled 2023-05-03 (×10): qty 1

## 2023-05-03 MED ORDER — DEXTROMETHORPHAN POLISTIREX ER 30 MG/5ML PO SUER: 30.0000 mg | Freq: Two times a day (BID) | ORAL | Status: DC | PRN

## 2023-05-03 MED ORDER — ORAL CARE MOUTH RINSE: 15.0000 mL | OROMUCOSAL | Status: DC | PRN

## 2023-05-03 NOTE — Progress Notes (Signed)
ASSUMPTION OF CARE NOTE   05/03/2023 3:27 PM  Marie [name] was seen and examined.  The H&P by the admitting provider, orders, imaging was reviewed.  Please see new orders.  Will continue to follow.   Vitals:   05/03/23 1047 05/03/23 1421  BP: (!) 124/56 106/85  Pulse: 66 68  Resp: 20 20  Temp: (!) 97.4 F (36.3 C) 97.7 F (36.5 C)  SpO2: 93% 97%    Results for orders placed or performed during the hospital encounter of 05/02/23  Resp panel by RT-PCR (RSV, Flu A&B, Covid) Anterior Nasal Swab   Specimen: Anterior Nasal Swab  Result Value Ref Range   SARS Coronavirus 2 by RT PCR NEGATIVE NEGATIVE   Influenza A by PCR NEGATIVE NEGATIVE   Influenza B by PCR NEGATIVE NEGATIVE   Resp Syncytial Virus by PCR NEGATIVE NEGATIVE  CBC with Differential  Result Value Ref Range   WBC 8.5 4.0 - 10.5 K/uL   RBC 3.44 (L) 3.87 - 5.11 MIL/uL   Hemoglobin 10.3 (L) 12.0 - 15.0 g/dL   HCT 40.9 (L) 81.1 - 91.4 %   MCV 90.4 80.0 - 100.0 fL   MCH 29.9 26.0 - 34.0 pg   MCHC 33.1 30.0 - 36.0 g/dL   RDW 78.2 95.6 - 21.3 %   Platelets 282 150 - 400 K/uL   nRBC 0.0 0.0 - 0.2 %   Neutrophils Relative % 65 %   Neutro Abs 5.5 1.7 - 7.7 K/uL   Lymphocytes Relative 20 %   Lymphs Abs 1.7 0.7 - 4.0 K/uL   Monocytes Relative 11 %   Monocytes Absolute 1.0 0.1 - 1.0 K/uL   Eosinophils Relative 3 %   Eosinophils Absolute 0.3 0.0 - 0.5 K/uL   Basophils Relative 1 %   Basophils Absolute 0.0 0.0 - 0.1 K/uL   Immature Granulocytes 0 %   Abs Immature Granulocytes 0.03 0.00 - 0.07 K/uL  Comprehensive metabolic panel  Result Value Ref Range   Sodium 135 135 - 145 mmol/L   Potassium 3.6 3.5 - 5.1 mmol/L   Chloride 100 98 - 111 mmol/L   CO2 24 22 - 32 mmol/L   Glucose, Bld 100 (H) 70 - 99 mg/dL   BUN 25 (H) 8 - 23 mg/dL   Creatinine, Ser 0.86 (H) 0.44 - 1.00 mg/dL   Calcium 9.4 8.9 - 57.8 mg/dL   Total Protein 7.1 6.5 - 8.1 g/dL   Albumin 3.3 (L) 3.5 - 5.0 g/dL   AST 19 15 - 41 U/L   ALT 13 0 - 44 U/L    Alkaline Phosphatase 74 38 - 126 U/L   Total Bilirubin 0.7 0.3 - 1.2 mg/dL   GFR, Estimated 42 (L) >60 mL/min   Anion gap 11 5 - 15  Strep pneumoniae urinary antigen  Result Value Ref Range   Strep Pneumo Urinary Antigen NEGATIVE NEGATIVE  Comprehensive metabolic panel  Result Value Ref Range   Sodium 135 135 - 145 mmol/L   Potassium 3.5 3.5 - 5.1 mmol/L   Chloride 101 98 - 111 mmol/L   CO2 25 22 - 32 mmol/L   Glucose, Bld 109 (H) 70 - 99 mg/dL   BUN 21 8 - 23 mg/dL   Creatinine, Ser 4.69 0.44 - 1.00 mg/dL   Calcium 8.7 (L) 8.9 - 10.3 mg/dL   Total Protein 5.8 (L) 6.5 - 8.1 g/dL   Albumin 2.6 (L) 3.5 - 5.0 g/dL   AST 15 15 - 41  U/L   ALT 11 0 - 44 U/L   Alkaline Phosphatase 61 38 - 126 U/L   Total Bilirubin 0.5 0.3 - 1.2 mg/dL   GFR, Estimated 60 (L) >60 mL/min   Anion gap 9 5 - 15  Magnesium  Result Value Ref Range   Magnesium 1.7 1.7 - 2.4 mg/dL  CBC with Differential/Platelet  Result Value Ref Range   WBC 12.0 (H) 4.0 - 10.5 K/uL   RBC 2.98 (L) 3.87 - 5.11 MIL/uL   Hemoglobin 8.6 (L) 12.0 - 15.0 g/dL   HCT 81.1 (L) 91.4 - 78.2 %   MCV 90.6 80.0 - 100.0 fL   MCH 28.9 26.0 - 34.0 pg   MCHC 31.9 30.0 - 36.0 g/dL   RDW 95.6 21.3 - 08.6 %   Platelets 231 150 - 400 K/uL   nRBC 0.0 0.0 - 0.2 %   Neutrophils Relative % 74 %   Neutro Abs 8.8 (H) 1.7 - 7.7 K/uL   Lymphocytes Relative 17 %   Lymphs Abs 2.0 0.7 - 4.0 K/uL   Monocytes Relative 7 %   Monocytes Absolute 0.9 0.1 - 1.0 K/uL   Eosinophils Relative 1 %   Eosinophils Absolute 0.2 0.0 - 0.5 K/uL   Basophils Relative 0 %   Basophils Absolute 0.1 0.0 - 0.1 K/uL   Immature Granulocytes 1 %   Abs Immature Granulocytes 0.09 (H) 0.00 - 0.07 K/uL  Troponin I (High Sensitivity)  Result Value Ref Range   Troponin I (High Sensitivity) 25 (H) <18 ng/L  Troponin I (High Sensitivity)  Result Value Ref Range   Troponin I (High Sensitivity) 24 (H) <18 ng/L     C. Laural Benes, MD [facility]   05/02/2023  1:44 PM How  to contact the [facility] Attending or Consulting provider 7A - 7P or covering provider during after hours 7P -7A, for this patient?  Check the care team in [facility] and look for a) attending/consulting TRH provider listed and b) the [facility] team listed Log into [email or web address] and use [facility] universal password to access. If you do not have the password, please contact the hospital operator. Locate the [facility] provider you are looking for under [facility] and page to a number that you can be directly reached. If you still have difficulty reaching the provider, please page the [facility] (Director on Call) for the Hospitalists listed on amion for assistance.

## 2023-05-03 NOTE — Plan of Care (Signed)
  Problem: Acute Rehab PT Goals(only PT should resolve) Goal: Pt Will Go Supine/Side To Sit Outcome: Progressing Flowsheets (Taken 05/03/2023 1423) Pt will go Supine/Side to Sit: with modified independence Goal: Patient Will Transfer Sit To/From Stand Outcome: Progressing Flowsheets (Taken 05/03/2023 1423) Patient will transfer sit to/from stand:  with supervision  with contact guard assist Goal: Pt Will Transfer Bed To Chair/Chair To Bed Outcome: Progressing Flowsheets (Taken 05/03/2023 1423) Pt will Transfer Bed to Chair/Chair to Bed:  with supervision  with contact guard assist Goal: Pt Will Ambulate Outcome: Progressing Flowsheets (Taken 05/03/2023 1423) Pt will Ambulate:  50 feet  with contact guard assist  with minimal assist  with rolling walker   2:24 PM, 05/03/23 Ocie Bob, MPT Physical Therapist with [facility] [phone] office [phone] mobile phone

## 2023-05-03 NOTE — Evaluation (Signed)
Physical Therapy Evaluation Patient Details Name: Marie [name] MRN: [id number] DOB: [birth year] Today's Date: 05/03/2023  History of Present Illness  Jennilyn Esteve is a 82 year old female with medical history significant of depression anxiety, vertigo, arthritis, presents to the ED with a chief complaint of dyspnea patient reports that she has dyspnea at baseline.  She came in today because it was worse than normal.  She is not sure when it became worse than normal.  She reports that she has a cough that is productive of minimal sputum.  She reports she has had some chest pain, but she is not sure when it started.  She is not having any chest pain at the time of my exam.  When she says she is here for dyspnea and hurting, the hernia that she is referring to is arthritis.  Patient denies any fevers.  Patient reports she tried inhalers for her dyspnea at home and they helped a little but did not relieve the symptoms.  She has not been around any sick contacts that she knows of.  Patient reports she did have COVID last month.  She has not felt quite right since.  Patient has no other complaints at this time.   Clinical Impression  Patient demonstrates slightly labored movement for sitting up at bedside with [facility] raised, incontinent of urine upon standing, slow labored cadence without loss of balance, but limited mostly due to c/o fatigue and generalized weakness.  Patient tolerated sitting up in chair after therapy.  Patient walked on room air with SpO2 at 91% and sitting BP at 127/61, HR 70 bpm, standing PB at 120/55, HR 71 bpm - nursing staff notified.  Patient will benefit from continued skilled physical therapy in hospital and recommended venue below to increase strength, balance, endurance for safe ADLs and gait.          If plan is discharge home, recommend the following: A little help with walking and/or transfers;A little help with bathing/dressing/bathroom;Help with stairs or ramp for  entrance;Assistance with cooking/housework   Can travel by private vehicle   Yes    Equipment Recommendations None recommended by PT  Recommendations for Other Services       Functional Status Assessment Patient has had a recent decline in their functional status and demonstrates the ability to make significant improvements in function in a reasonable and predictable amount of time.     Precautions / Restrictions Precautions Precautions: Fall Restrictions Weight Bearing Restrictions: No      Mobility  Bed Mobility Overal bed mobility: Modified Independent                  Transfers Overall transfer level: Needs assistance Equipment used: Rolling walker (2 wheels) Transfers: Sit to/from Stand, Bed to chair/wheelchair/BSC Sit to Stand: Contact guard assist   Step pivot transfers: Contact guard assist       General transfer comment: unsteady labored movement    Ambulation/Gait Ambulation/Gait assistance: Min assist Gait Distance (Feet): 22 Feet Assistive device: Rolling walker (2 wheels) Gait Pattern/deviations: Decreased step length - right, Decreased step length - left, Decreased stride length Gait velocity: slow     General Gait Details: slow labored unsteady cadence without loss of balance, increased time for making turns and limited mostly due to fatigue and generalized weakness  Stairs            Wheelchair Mobility     Tilt Bed    Modified Rankin (Stroke Patients Only)  Balance Overall balance assessment: Needs assistance Sitting-balance support: Feet supported, No upper extremity supported Sitting balance-Leahy Scale: Fair Sitting balance - Comments: fair/good seated at EOB   Standing balance support: During functional activity, Reliant on assistive device for balance, Bilateral upper extremity supported Standing balance-Leahy Scale: Fair Standing balance comment: using RW                             Pertinent  Vitals/Pain Pain Assessment Pain Assessment: Faces Faces Pain Scale: Hurts little more Pain Location: L chest region Pain Descriptors / Indicators: Discomfort, Grimacing, Guarding Pain Intervention(s): Limited activity within patient's tolerance, Monitored during session, Repositioned    Home Living Family/patient expects to be discharged to:: Private residence Living Arrangements: Children Available Help at Discharge: Family;Available PRN/intermittently Type of Home: House Home Access: Stairs to enter Entrance Stairs-Rails: None Entrance Stairs-Number of Steps: 1   Home Layout: One level Home Equipment: Agricultural consultant (2 wheels);Cane - quad;BSC/3in1;Shower seat;Grab bars - tub/shower;Grab bars - toilet      Prior Function Prior Level of Function : Independent/Modified Independent;Needs assist       Physical Assist : ADLs (physical);Mobility (physical) Mobility (physical): Bed mobility;Transfers;Gait;Stairs ADLs (physical): IADLs Mobility Comments: RW for community and household ambulation. ADLs Comments: Independent ADL; assist IADL from family     Extremity/Trunk Assessment   Upper Extremity Assessment Upper Extremity Assessment: Defer to OT evaluation LUE Deficits / Details: Limited to ~50% A/ROM at baseline.    Lower Extremity Assessment Lower Extremity Assessment: Generalized weakness    Cervical / Trunk Assessment Cervical / Trunk Assessment: Kyphotic  Communication   Communication Communication: No apparent difficulties  Cognition Arousal: Alert Behavior During Therapy: WFL for tasks assessed/performed Overall Cognitive Status: Within Functional Limits for tasks assessed                                          General Comments      Exercises     Assessment/Plan    PT Assessment Patient needs continued PT services  PT Problem List Decreased strength;Decreased activity tolerance;Decreased balance;Decreased mobility       PT  Treatment Interventions DME instruction;Gait training;Stair training;Functional mobility training;Therapeutic activities;Therapeutic exercise;Balance training;Patient/family education    PT Goals (Current goals can be found in the Care Plan section)  Acute Rehab PT Goals Patient Stated Goal: return home with family to assist PT Goal Formulation: With patient Time For Goal Achievement: 05/17/23 Potential to Achieve Goals: Good    Frequency Min 3X/week     Co-evaluation PT/OT/SLP Co-Evaluation/Treatment: Yes Reason for Co-Treatment: To address functional/ADL transfers PT goals addressed during session: Mobility/safety with mobility;Balance;Proper use of DME OT goals addressed during session: ADL's and self-care       AM-PAC PT "6 Clicks" Mobility  Outcome Measure Help needed turning from your back to your side while in a flat bed without using bedrails?: None Help needed moving from lying on your back to sitting on the side of a flat bed without using bedrails?: A Little Help needed moving to and from a bed to a chair (including a wheelchair)?: A Little Help needed standing up from a chair using your arms (e.g., wheelchair or bedside chair)?: A Little Help needed to walk in hospital room?: A Little Help needed climbing 3-5 steps with a railing? : A Lot 6 Click Score: 18  End of Session   Activity Tolerance: Patient tolerated treatment well;Patient limited by fatigue Patient left: in chair;with call bell/phone within reach Nurse Communication: Mobility status PT Visit Diagnosis: Unsteadiness on feet (R26.81);Other abnormalities of gait and mobility (R26.89);Muscle weakness (generalized) (M62.81)    Time: 9604-5409 PT Time Calculation (min) (ACUTE ONLY): 25 min   Charges:   PT Evaluation $PT Eval Moderate Complexity: 1 Mod PT Treatments $Therapeutic Activity: 23-37 mins PT General Charges $$ ACUTE PT VISIT: 1 Visit         2:22 PM, 05/03/23 Ocie Bob,  MPT Physical Therapist with [facility] 336 [phone] office [phone] mobile phone

## 2023-05-03 NOTE — TOC Initial Note (Signed)
Transition of Care [facility]) - Initial/Assessment Note    Patient Details  Name: Marie [name] MRN: [id number] Date of Birth: 04-27-1941  [facility] CM/SW Contact:    Leitha Bleak, RN Phone Number: 05/03/2023, 11:40 AM  Clinical Narrative:          Patient admitted in OBS with Acute respiratory failure with hypoxia. Recent admission, discharge home with [facility]. CM spoke with her son, Harvie Heck. He stated she has been struggling and weak. He is agreeable to SNF. PT is recommending SNF.  CM spoke with patient, she is agreeable. She wants [place]. She has been to [facility] in the past. Referral sent TOC following.          Expected Discharge Plan: Skilled Nursing Facility Barriers to Discharge: Continued Medical Work up   Patient Goals and CMS Choice Patient states their goals for this hospitalization and ongoing recovery are:: agreeable to SNF CMS Medicare.gov Compare Post Acute Care list provided to:: Patient Choice offered to / list presented to : Patient     Expected Discharge Plan and Services       Living arrangements for the past 2 months: Single Family Home                    Prior Living Arrangements/Services Living arrangements for the past 2 months: Single Family Home Lives with:: Adult Children Patient language and need for interpreter reviewed:: Yes        Need for Family Participation in Patient Care: Yes (Comment) Care giver support system in place?: Yes (comment) Current home services: DME Criminal Activity/Legal Involvement Pertinent to Current Situation/Hospitalization: No - Comment as needed  Activities of Daily Living     Emotional Assessment   Attitude/Demeanor/Rapport: Engaged Affect (typically observed): Accepting Orientation: : Oriented to Self, Oriented to Place, Oriented to  Time, Oriented to Situation Alcohol / Substance Use: Not Applicable Psych Involvement: No (comment)  Admission diagnosis:  Hypoxia [R09.02] Acute  respiratory failure with hypoxia (HCC) [J96.01] Pneumonia due to infectious organism, unspecified laterality, unspecified part of lung [J18.9] Near syncope [R55] Patient Active Problem List   Diagnosis Date Noted   Atypical pneumonia 05/03/2023   Elevated troponin 05/03/2023   Near syncope 05/03/2023   Acute respiratory failure with hypoxia (HCC) 05/02/2023   Sensory disturbance 04/25/2023   Seizure-like activity (HCC) 04/25/2023   Speech abnormality 04/24/2023   Dyspnea 04/24/2023   Chest pain 11/21/2020   Dehydration 01/04/2017   LLQ pain 12/27/2015   Weight loss 12/17/2011   Lymphocytic colitis 11/21/2011   LUQ pain 11/21/2011   Diarrhea 04/30/2011   ANEMIA, MILD 08/05/2008   Depression 08/05/2008   Essential hypertension 08/05/2008   GERD 08/05/2008   Irritable bowel syndrome 08/05/2008   Diffuse connective tissue disease (HCC) 08/05/2008   ARTHRITIS 08/05/2008   FIBROMYALGIA 08/05/2008   ABDOMINAL BLOATING 08/05/2008   FECAL INCONTINENCE 08/05/2008   DIARRHEA, RECURRENT 08/05/2008   Abdominal pain 08/05/2008   DIVERTICULOSIS, COLON, HX OF 08/05/2008   PCP:  [facility] Pharmacy:   [facility] - [place], [place] - [address] [place] [address] Phone: [phone] Fax: [phone]     Social Determinants of Health (SDOH) Social History: SDOH Screenings   Food Insecurity: No Food Insecurity (04/24/2023)  Housing: Low Risk  (04/24/2023)  Transportation Needs: No Transportation Needs (04/24/2023)  Utilities: Not At Risk (04/24/2023)  Tobacco Use: Low Risk  (05/02/2023)   SDOH Interventions:  Readmission Risk Interventions    05/03/2023   11:39 AM  Readmission Risk Prevention Plan  Transportation Screening Complete  PCP or Specialist Appt within 5-7 Days Not Complete  Home Care Screening Complete  Medication Review (RN CM) Complete

## 2023-05-03 NOTE — Plan of Care (Signed)
  Problem: Acute Rehab OT Goals (only OT should resolve) Goal: Pt. Will Perform Grooming Flowsheets (Taken 05/03/2023 1225) Pt Will Perform Grooming:  with modified independence  standing Goal: Pt. Will Perform Lower Body Dressing Flowsheets (Taken 05/03/2023 1225) Pt Will Perform Lower Body Dressing:  with modified independence  sitting/lateral leans Goal: Pt. Will Transfer To Toilet Flowsheets (Taken 05/03/2023 1225) Pt Will Transfer to Toilet:  with modified independence  ambulating Goal: Pt/Caregiver Will Perform Home Exercise Program Flowsheets (Taken 05/03/2023 1225) Pt/caregiver will Perform Home Exercise Program:  Increased strength  Increased ROM  Right Upper extremity  Left upper extremity  Independently  Kellsey Sansone OT, MOT

## 2023-05-03 NOTE — Plan of Care (Signed)

## 2023-05-03 NOTE — Evaluation (Signed)
Occupational Therapy Evaluation Patient Details Name: Marie [name] MRN: [id number] DOB: Oct 19, 1940 Today's Date: 05/03/2023   History of Present Illness Marie [name] is a 82 year old female with medical history significant of depression anxiety, vertigo, arthritis, presents to the ED with a chief complaint of dyspnea patient reports that she has dyspnea at baseline.  She came in today because it was worse than normal.  She is not sure when it became worse than normal.  She reports that she has a cough that is productive of minimal sputum.  She reports she has had some chest pain, but she is not sure when it started.  She is not having any chest pain at the time of my exam.  When she says she is here for dyspnea and hurting, the hernia that she is referring to is arthritis.  Patient denies any fevers.  Patient reports she tried inhalers for her dyspnea at home and they helped a little but did not relieve the symptoms.  She has not been around any sick contacts that she knows of.  Patient reports she did have COVID last month.  She has not felt quite right since.  Patient has no other complaints at this time. (per DO)   Clinical Impression   Pt agreeable to OT and PT co-evaluation. Pt is mostly independent at baseline with assist from family for some IADL's. Pt lives with her son who works. Pt seated and standing blood pressures taken with the following results: seated: 127/ 61 and 71 BPM, standing 120/55 fluctuating HR with low of ~39 BPM and high of ~104 BPM. Pt did not report dizziness today. Endurance and SOB seem to be the primary limiting factor along with general weakness. Supplemental O2 removed for the duration of the session. In current condition pt would a fall risk if at home alone for much of the day. Pt will benefit from continued OT in the hospital and recommended venue below to increase strength, balance, and endurance for safe ADL's.          If plan is discharge home, recommend the  following: A little help with walking and/or transfers;A little help with bathing/dressing/bathroom;Help with stairs or ramp for entrance;Assist for transportation;Assistance with cooking/housework    Functional Status Assessment  Patient has had a recent decline in their functional status and demonstrates the ability to make significant improvements in function in a reasonable and predictable amount of time.  Equipment Recommendations  None recommended by OT    Recommendations for Other Services       Precautions / Restrictions Precautions Precautions: Fall Restrictions Weight Bearing Restrictions: No      Mobility Bed Mobility Overal bed mobility: Modified Independent                  Transfers Overall transfer level: Needs assistance Equipment used: Rolling walker (2 wheels) Transfers: Sit to/from Stand, Bed to chair/wheelchair/BSC Sit to Stand: Contact guard assist     Step pivot transfers: Contact guard assist     General transfer comment: Increase in SOB at times; generally weak; labored effort      Balance Overall balance assessment: Needs assistance Sitting-balance support: No upper extremity supported, Feet supported Sitting balance-Leahy Scale: Good Sitting balance - Comments: fair to good seated at EOB   Standing balance support: Bilateral upper extremity supported, During functional activity, Reliant on assistive device for balance Standing balance-Leahy Scale: Fair Standing balance comment: using RW  ADL either performed or assessed with clinical judgement   ADL Overall ADL's : Needs assistance/impaired     Grooming: Contact guard assist;Standing   Upper Body Bathing: Set up;Sitting   Lower Body Bathing: Set up;Sitting/lateral leans       Lower Body Dressing: Set up;Sitting/lateral leans Lower Body Dressing Details (indicate cue type and reason): Able to don socks seated at EOB with extended  time. Toilet Transfer: Electronics engineer Details (indicate cue type and reason): Simulated via EOB to chair and ambulation in the room/hall using RW. Toileting- Clothing Manipulation and Hygiene: Set up;Contact guard assist;Sit to/from stand;Sitting/lateral lean       Functional mobility during ADLs: Contact guard assist;Rolling walker (2 wheels)       Vision Baseline Vision/History: 0 No visual deficits Ability to See in Adequate Light: 0 Adequate Patient Visual Report: No change from baseline Vision Assessment?: No apparent visual deficits     Perception Perception: Not tested       Praxis Praxis: Not tested       Pertinent Vitals/Pain Pain Assessment Pain Assessment: Faces Faces Pain Scale: Hurts little more Pain Location: L chest region Pain Descriptors / Indicators: Discomfort, Grimacing, Guarding Pain Intervention(s): Limited activity within patient's tolerance, Monitored during session, Repositioned     Extremity/Trunk Assessment Upper Extremity Assessment Upper Extremity Assessment: Right hand dominant;LUE deficits/detail (Generally weak R UE.) LUE Deficits / Details: Limited to ~50% A/ROM at baseline.   Lower Extremity Assessment Lower Extremity Assessment: Defer to PT evaluation   Cervical / Trunk Assessment Cervical / Trunk Assessment: Kyphotic   Communication Communication Communication: No apparent difficulties   Cognition Arousal: Alert Behavior During Therapy: WFL for tasks assessed/performed Overall Cognitive Status: Within Functional Limits for tasks assessed                                                        Home Living Family/patient expects to be discharged to:: Private residence Living Arrangements: Children Available Help at Discharge: Family;Available PRN/intermittently Type of Home: House Home Access: Stairs to enter [employer] of Steps: 1 Entrance Stairs-Rails:  None Home Layout: One level     Bathroom Shower/Tub: Chief Strategy Officer: Handicapped height Bathroom Accessibility: Yes How Accessible: Accessible via wheelchair;Accessible via walker Home Equipment: Rolling Walker (2 wheels);Cane - quad;BSC/3in1;Shower seat;Grab bars - tub/shower;Grab bars - toilet          Prior Functioning/Environment Prior Level of Function : Independent/Modified Independent;Needs assist       Physical Assist : ADLs (physical)   ADLs (physical): IADLs Mobility Comments: RW for community and household ambulation. ADLs Comments: Independent ADL; assist IADL from family        OT Problem List: Decreased strength;Decreased activity tolerance;Decreased range of motion;Cardiopulmonary status limiting activity      OT Treatment/Interventions: Self-care/ADL training;Therapeutic activities;Therapeutic exercise;Patient/family education    OT Goals(Current goals can be found in the care plan section) Acute Rehab OT Goals Patient Stated Goal: return home OT Goal Formulation: With patient Time For Goal Achievement: 05/17/23 Potential to Achieve Goals: Good  OT Frequency: Min 2X/week    Co-evaluation PT/OT/SLP Co-Evaluation/Treatment: Yes Reason for Co-Treatment: To address functional/ADL transfers   OT goals addressed during session: ADL's and self-care  End of Session Equipment Utilized During Treatment: Rolling walker (2 wheels)  Activity Tolerance: Patient tolerated treatment well Patient left: in chair;with call bell/phone within reach  OT Visit Diagnosis: Unsteadiness on feet (R26.81);Other abnormalities of gait and mobility (R26.89);Muscle weakness (generalized) (M62.81)                Time: 1660-6301 OT Time Calculation (min): 25 min Charges:  OT General Charges $OT Visit: 1 Visit OT Evaluation $OT Eval Low Complexity: 1 Low  Alexsis Branscom OT, MOT   Danie Chandler 05/03/2023,  12:22 PM

## 2023-05-03 NOTE — H&P (Addendum)
Expand All Collapse All   History and Physical      Patient: Marie [name], MRN DOA: 05/02/2023  Patient coming from: Home   Chief Complaint:     Chief Complaint  Patient presents with   dyspnea    HPI: Marie [name] is a 82 year old female with medical history significant of depression anxiety, vertigo, arthritis, presents to the ED with a chief complaint of dyspnea patient reports that she has dyspnea at baseline.  She came in today because it was worse than normal.  She is not sure when it became worse than normal.  She reports that she has a cough that is productive of minimal sputum.  She reports she has had some chest pain, but she is not sure when it started.  She is not having any chest pain at the time of my exam.  When she says she is here for dyspnea and hurting, the hernia that she is referring to is arthritis.  Patient denies any fevers.  Patient reports she tried inhalers for her dyspnea at home and they helped a little but did not relieve the symptoms.  She has not been around any sick contacts that she knows of.  Patient reports she did have COVID last month.  She has not felt quite right since.  Patient has no other complaints at this time.   Patient does not smoke, does not drink.  Patient is full code. Review of Systems: As mentioned in the history of present illness. All other systems reviewed and are negative.     Past Medical History:  Diagnosis Date   Degenerative arthritis     Vertigo               Past Surgical History:  Procedure Laterality Date   TONSILLECTOMY AND ADENOIDECTOMY       TUBAL LIGATION            Social History:  reports that she has quit smoking. Her smoking use included cigarettes. She has never used smokeless tobacco. She reports that she does not drink alcohol and does not use drugs.   Allergies       Allergies  Allergen Reactions   Benadryl [Diphenhydramine Hcl (Sleep)] Other (See Comments)      "feels disconnected"              Family History  Problem Relation Age of Onset   Congestive Heart Failure Paternal Grandmother     Diabetes Maternal Grandfather     Thyroid disease Sister     Irritable bowel syndrome Sister                   Prior to Admission medications   Medication Sig Start Date End Date Taking? Authorizing Provider  azithromycin (ZITHROMAX) 250 MG tablet Take 250 mg by mouth as directed. 01/07/23   Yes [provider]  busPIRone (BUSPAR) 7.5 MG tablet Take 7.5 mg by mouth 2 (two) times daily. 05/02/23   Yes [provider]  doxycycline (VIBRAMYCIN) 100 MG capsule Take 100 mg by mouth 2 (two) times daily. 04/12/23   Yes [provider]  ezetimibe (ZETIA) 10 MG tablet Take 10 mg by mouth daily. 03/16/23   Yes [provider]  meloxicam (MOBIC) 7.5 MG tablet Take 7.5 mg by mouth 2 (two) times daily. 05/01/23   Yes [provider]  montelukast (SINGULAIR) 10 MG tablet Take 10 mg by mouth daily. 05/01/23   Yes [provider]  aspirin EC  81 MG tablet Take 81 mg by mouth daily.       [provider]  b complex vitamins capsule Take 1 capsule by mouth daily.       [provider]  clobetasol cream (TEMOVATE) 0.05 % Apply 1 application topically 2 (two) times daily. 06/04/16     Adline Potter, NP  fluticasone (FLONASE) 50 MCG/ACT nasal spray Place 1 spray into both nostrils daily. 05/23/19     Petrucelli, Samantha R, PA-C  ibuprofen (ADVIL,MOTRIN) 200 MG tablet Take 600 mg by mouth at bedtime.       [provider]  meclizine (ANTIVERT) 25 MG tablet daily 05/30/16     [provider]  nitrofurantoin (MACRODANTIN) 50 MG capsule Take 50 mg by mouth 2 (two) times daily. 12/03/22     [provider]      Physical Exam:       Vitals:    05/03/23 0120 05/03/23 0200 05/03/23 0300 05/03/23 0343  BP: 122/70 132/67 125/64 (!) 143/68  Pulse: 71 69 68 72  Resp: 12 17 15 17   Temp:       97.9 F (36.6 C)  TempSrc:        Oral  SpO2: 96% 96% 96% 97%  Weight:          Height:            1.  General: Patient lying supine in bed,  no acute distress   2. Psychiatric: Alert and oriented x person and place, mood and behavior normal for situation, pleasant and cooperative with exam   3. Neurologic: Speech and language are normal, face is symmetric, moves all 4 extremities voluntarily, at baseline without acute deficits on limited exam   4. HEENMT:  Head is atraumatic, normocephalic, pupils reactive to light, neck is supple, trachea is midline, mucous membranes are moist   5. Respiratory : Lungs are clear to auscultation bilaterally without wheezing, rhonchi, rales, no cyanosis, no increase in work of breathing or accessory muscle use, on 2 L nasal cannula   6. Cardiovascular : Heart rate normal, rhythm is regular, no murmurs, rubs or gallops, no peripheral edema, peripheral pulses palpated   7. Gastrointestinal:  Abdomen is soft, nondistended, nontender to palpation bowel sounds active, no masses or organomegaly palpated   8. Skin:  Skin is warm, dry and intact without rashes, acute lesions, or ulcers on limited exam   9.Musculoskeletal:  No acute deformities or trauma, no asymmetry in tone, no peripheral edema, peripheral pulses palpated, no tenderness to palpation in the extremities   Data Reviewed: In the ED Temp 97.8, heart rate 64-82, respiratory rate 18-22, blood pressure 116/56-149/81, satting 91-98% No leukocytosis with a white blood cell count of 8.5, hemoglobin 10.3, platelets 282 Albumin slightly down at 3.3 Chemistry reveals an AKI with creatinine of 1.29  Chest x-ray shows atypical pneumonia EKG shows a heart rate of 59, sinus bradycardia, QTc 421 Admission requested for respiratory failure with hypoxia     Assessment and Plan: * Near syncope - Likely orthostatic - Witnessed in the ED which patient had reported was similar to her symptoms at home - Patient stood up her blood  pressure dropped heart rate went up, she became pale, diaphoretic, and felt dizzy. - Continue IV fluids - 2 L of fluid given in the ED - Trend orthostatic vital signs in the a.m. - Check echo - Monitor telemetry - Continue to monitor   Elevated troponin - Troponin elevated at  2 5 and 24 - Likely demand ischemia with the blood pressure drop - EKG shows a heart rate of 59, sinus bradycardia, QTc 421 - Monitor on telemetry - Echo in the a.m.   AKI (acute kidney injury) (HCC) - Creatinine at baseline 0.9 to - Creatinine today 1.29 - Avoid nephrotoxic agents when possible including hydrochlorothiazide losartan - Patient recently been started on Macrobid for UTI - Urine analysis is borderline - Urine culture pending - Continue to monitor   CAP (community acquired pneumonia) due to Pneumococcus [facility]) - Chest x-ray shows atypical pneumonia - Patient was started on Rocephin in the ED - Add Zithromax - Check strep and Legionella urine antigens - Expectorated sputum culture - Continue to monitor   Acute respiratory failure with hypoxia (HCC) - COVID last month - Chest x-ray shows atypical pneumonia - COVID pending - Pulse ox was done in the 80s upon just standing up - No oxygen requirement at home - Continue 2 L nasal cannula - Wean off as tolerated - Continue to monitor          Advance Care Planning:   Code Status: Full Code   Consults: None at this time   Family Communication: No family at bedside   Severity of Illness: The appropriate patient status for this patient is OBSERVATION. Observation status is judged to be reasonable and necessary in order to provide the required intensity of service to ensure the patient's safety. The patient's presenting symptoms, physical exam findings, and initial radiographic and laboratory data in the context of their medical condition is felt to place them at decreased risk for further clinical deterioration. Furthermore, it is  anticipated that the patient will be medically stable for discharge from the hospital within 2 midnights of admission.    Author: Lilyan Gilford, DO 05/03/2023 4:00 AM   For on call review [email or web address].            Routing History

## 2023-05-04 DIAGNOSIS — J9601 Acute respiratory failure with hypoxia: Secondary | ICD-10-CM | POA: Diagnosis not present

## 2023-05-04 DIAGNOSIS — R55 Syncope and collapse: Secondary | ICD-10-CM

## 2023-05-04 DIAGNOSIS — J189 Pneumonia, unspecified organism: Secondary | ICD-10-CM | POA: Diagnosis not present

## 2023-05-04 NOTE — Plan of Care (Signed)
  Problem: Elimination: Goal: Will not experience complications related to bowel motility Outcome: Completed/Met   Problem: Pain Managment: Goal: General experience of comfort will improve Outcome: Completed/Met   

## 2023-05-04 NOTE — TOC Progression Note (Addendum)
Transition of Care [facility]) - Progression Note    Patient Details  Name: Marie [name] MRN: [id number] Date of Birth: 04-May-1941  [facility] CM/SW Contact  Catalina Gravel, [place] Phone Number: 05/04/2023, 11:01 AM  Clinical Narrative:    CSW  reviewed bed requests, still pending.  FL2 and PASSR complete #[id number].  CSW checked Navi to initiate auth- pt did not populate nor is [employer] listed in Availity portal.  Bed offers pending,TOC following.     Expected Discharge Plan: Skilled Nursing Facility Barriers to Discharge: Insurance Authorization  Expected Discharge Plan and Services       Living arrangements for the past 2 months: Single Family Home                                       Social Determinants of Health (SDOH) Interventions SDOH Screenings   Food Insecurity: No Food Insecurity (04/24/2023)  Housing: Low Risk  (04/24/2023)  Transportation Needs: No Transportation Needs (04/24/2023)  Utilities: Not At Risk (04/24/2023)  Tobacco Use: Low Risk  (05/02/2023)    Readmission Risk Interventions    05/03/2023   11:39 AM  Readmission Risk Prevention Plan  Transportation Screening Complete  PCP or Specialist Appt within 5-7 Days Not Complete  Home Care Screening Complete  Medication Review (RN CM) Complete

## 2023-05-04 NOTE — Hospital Course (Signed)
82 year old female with medical history significant of depression anxiety, vertigo, arthritis, presents to the ED with a chief complaint of dyspnea patient reports that she has dyspnea at baseline.  She came in today because it was worse than normal.  She is not sure when it became worse than normal.  She reports that she has a cough that is productive of minimal sputum.  She reports she has had some chest pain, but she is not sure when it started.  She is not having any chest pain at the time of my exam.  When she says she is here for dyspnea and hurting, the hernia that she is referring to is arthritis.  Patient denies any fevers.  Patient reports she tried inhalers for her dyspnea at home and they helped a little but did not relieve the symptoms.  She has not been around any sick contacts that she knows of.  Patient reports she did have COVID last month.  She has not felt quite right since.  Patient has no other complaints at this time.

## 2023-05-04 NOTE — [place] FL2 (Signed)
[place] MEDICAID [id number] LEVEL OF CARE FORM     IDENTIFICATION  Patient Name: Marie [name] Birthdate: 1941/06/25 Sex: female Admission Date (Current Location): 05/02/2023  [facility] and [place] Number:  [employer] and Address:  [facility],  [address] [place] [address]      Provider Number: [id number]  Attending Physician Name and Address:  Cleora Fleet, MD  Relative Name and Phone Number:  Milus Glazier [phone])  [phone] [facility])    Current Level of Care: Hospital Recommended Level of Care: Skilled Nursing Facility Prior Approval Number:    Date Approved/Denied:   PASRR Number: [id number]  Discharge Plan: SNF    Current Diagnoses: Patient Active Problem List   Diagnosis Date Noted   Atypical pneumonia 05/03/2023   Elevated troponin 05/03/2023   Near syncope 05/03/2023   Acute respiratory failure with hypoxia (HCC) 05/02/2023   Sensory disturbance 04/25/2023   Seizure-like activity (HCC) 04/25/2023   Speech abnormality 04/24/2023   Dyspnea 04/24/2023   Chest pain 11/21/2020   Dehydration 01/04/2017   LLQ pain 12/27/2015   Weight loss 12/17/2011   Lymphocytic colitis 11/21/2011   LUQ pain 11/21/2011   Diarrhea 04/30/2011   ANEMIA, MILD 08/05/2008   Depression 08/05/2008   Essential hypertension 08/05/2008   GERD 08/05/2008   Irritable bowel syndrome 08/05/2008   Diffuse connective tissue disease (HCC) 08/05/2008   ARTHRITIS 08/05/2008   FIBROMYALGIA 08/05/2008   ABDOMINAL BLOATING 08/05/2008   FECAL INCONTINENCE 08/05/2008   DIARRHEA, RECURRENT 08/05/2008   Abdominal pain 08/05/2008   DIVERTICULOSIS, COLON, HX OF 08/05/2008    Orientation RESPIRATION BLADDER Height & Weight     Self, Time, Situation, Place  O2 Incontinent, External catheter Weight: 143 lb 15.4 oz (65.3 kg) Height:  5\' 3"  (160 cm)  BEHAVIORAL SYMPTOMS/MOOD NEUROLOGICAL BOWEL NUTRITION STATUS      Continent Diet  AMBULATORY STATUS COMMUNICATION OF  NEEDS Skin   Limited Assist Verbally Other (Comment) (Dry)                       Personal Care Assistance Level of Assistance  Bathing, Feeding, Dressing, Total care Bathing Assistance: Limited assistance Feeding assistance: Limited assistance Dressing Assistance: Limited assistance Total Care Assistance: Independent   Functional Limitations Info             SPECIAL CARE FACTORS FREQUENCY  PT (By licensed PT), OT (By licensed OT)     PT Frequency: 5X a week OT Frequency: 5X a week            Contractures Contractures Info: Not present    Additional Factors Info  Code Status, Allergies Code Status Info: Full Allergies Info: Codeine, Promethazine Hcl           Current Medications (05/04/2023):  This is the current hospital active medication list Current Facility-Administered Medications  Medication Dose Route Frequency Provider Last Rate Last Admin   acetaminophen (TYLENOL) tablet 650 mg  650 mg Oral Q6H PRN Zierle-Ghosh, Asia B, DO       Or   acetaminophen (TYLENOL) suppository 650 mg  650 mg Rectal Q6H PRN Zierle-Ghosh, Asia B, DO       albuterol (PROVENTIL) (2.5 MG/3ML) 0.083% nebulizer solution 2.5 mg  2.5 mg Nebulization Q2H PRN Zierle-Ghosh, Asia B, DO       azithromycin (ZITHROMAX) 500 mg in sodium chloride 0.9 % 250 mL IVPB  500 mg Intravenous Q24H Zierle-Ghosh, Asia B, DO 250 mL/hr at 05/03/23 1745 500  mg at 05/03/23 1745   cefTRIAXone (ROCEPHIN) 2 g in sodium chloride 0.9 % 100 mL IVPB  2 g Intravenous Q24H Zierle-Ghosh, Asia B, DO 200 mL/hr at 05/03/23 1853 2 g at 05/03/23 1853   citalopram (CELEXA) tablet 40 mg  40 mg Oral Daily Zierle-Ghosh, Asia B, DO   40 mg at 05/04/23 1004   clonazePAM (KLONOPIN) tablet 1 mg  1 mg Oral BID PRN Zierle-Ghosh, Asia B, DO       dextromethorphan (DELSYM) 30 MG/5ML liquid 30 mg  30 mg Oral BID PRN Johnson, Clanford L, MD       guaiFENesin (MUCINEX) 12 hr tablet 600 mg  600 mg Oral BID Johnson, Clanford L, MD   600 mg at  05/04/23 1004   heparin injection 5,000 Units  5,000 Units Subcutaneous Q8H Zierle-Ghosh, Asia B, DO   5,000 Units at 05/04/23 0649   ondansetron (ZOFRAN) tablet 4 mg  4 mg Oral Q6H PRN Zierle-Ghosh, Asia B, DO       Or   ondansetron (ZOFRAN) injection 4 mg  4 mg Intravenous Q6H PRN Zierle-Ghosh, Asia B, DO       Oral care mouth rinse  15 mL Mouth Rinse PRN Zierle-Ghosh, Asia B, DO       oxyCODONE (Oxy IR/ROXICODONE) immediate release tablet 5 mg  5 mg Oral Q4H PRN Zierle-Ghosh, Asia B, DO         Discharge Medications: Please see discharge summary for a list of discharge medications.  Relevant Imaging Results:  Relevant Lab Results:   Additional Information [id number]  Catalina Gravel, LCSW

## 2023-05-04 NOTE — Progress Notes (Signed)
PROGRESS NOTE   NALY SCHWANZ  YQI:[id number] DOB: June 25, 1941 DOA: 05/02/2023 PCP: [facility]   Chief Complaint  Patient presents with   Shortness of Breath   Level of care: Telemetry  Brief Admission History:  82 year old female with medical history significant of depression anxiety, vertigo, arthritis, presents to the ED with a chief complaint of dyspnea patient reports that she has dyspnea at baseline.  She came in today because it was worse than normal.  She is not sure when it became worse than normal.  She reports that she has a cough that is productive of minimal sputum.  She reports she has had some chest pain, but she is not sure when it started.  She is not having any chest pain at the time of my exam.  When she says she is here for dyspnea and hurting, the hernia that she is referring to is arthritis.  Patient denies any fevers.  Patient reports she tried inhalers for her dyspnea at home and they helped a little but did not relieve the symptoms.  She has not been around any sick contacts that she knows of.  Patient reports she did have COVID last month.  She has not felt quite right since.  Patient has no other complaints at this time.    Assessment and Plan:  Near syncope - secondary to dehydration and orthostatic hypotension - feeling much better after IV fluid - syncope work up has been reassuring - continue supportive measures  AKI  - prerenal - improved after IV Fluid hydration   CAP - continue azithromycin, ceftriaxone  - continue supportive measures - clinically improving  Acute respiratory failure with hypoxia  - continue supportive measures and pneumonia treatments  - improving - wean oxygen as able to room air  DVT prophylaxis: SQ heparin  Code Status: Full  Family Communication:  Disposition: waiting on SNF placement    Consultants:   Procedures:   Antimicrobials:    Subjective: Pt reports feeling weak but breathing  better today  Seems agreeable to SNF rehab.   Objective: Vitals:   05/03/23 1421 05/03/23 1940 05/04/23 0443 05/04/23 1355  BP: 106/85 (!) 130/56 (!) 140/61 (!) 153/74  Pulse: 68 92 65 77  Resp: 20 18 17 19   Temp: 97.7 F (36.5 C) 98.4 F (36.9 C) 98.2 F (36.8 C)   TempSrc: Oral Oral Oral   SpO2: 97% 97% 94% 99%  Weight:      Height:        Intake/Output Summary (Last 24 hours) at 05/04/2023 1529 Last data filed at 05/04/2023 1300 Gross per 24 hour  Intake 751.31 ml  Output 600 ml  Net 151.31 ml   Filed Weights   05/02/23 1340 05/02/23 2235  Weight: 65.3 kg 65.3 kg   Examination:  General exam: Appears calm and comfortable  Respiratory system: rales RUL  Cardiovascular system: normal S1 & S2 heard. No JVD, murmurs, rubs, gallops or clicks. No pedal edema. Gastrointestinal system: Abdomen is nondistended, soft and nontender. No organomegaly or masses felt. Normal bowel sounds heard. Central nervous system: Alert and oriented. No focal neurological deficits. Extremities: Symmetric 5 x 5 power. Skin: No rashes, lesions or ulcers. Psychiatry: Judgement and insight appear normal. Mood & affect appropriate.   Data Reviewed: I have personally reviewed following labs and imaging studies  CBC: Recent Labs  Lab 05/02/23 1408 05/03/23 0454  WBC 8.5 12.0*  NEUTROABS 5.5 8.8*  HGB 10.3* 8.6*  HCT  31.1* 27.0*  MCV 90.4 90.6  PLT 282 231    Basic Metabolic Panel: Recent Labs  Lab 05/02/23 1408 05/03/23 0454  NA 135 135  K 3.6 3.5  CL 100 101  CO2 24 25  GLUCOSE 100* 109*  BUN 25* 21  CREATININE 1.29* 0.96  CALCIUM 9.4 8.7*  MG  --  1.7    CBG: No results for input(s): "GLUCAP" in the last 168 hours.  Recent Results (from the past 240 hour(s))  Resp panel by RT-PCR (RSV, Flu A&B, Covid) Anterior Nasal Swab     Status: None   Collection Time: 05/02/23  3:37 PM   Specimen: Anterior Nasal Swab  Result Value Ref Range Status   SARS Coronavirus 2 by RT PCR  NEGATIVE NEGATIVE Final    Comment: (NOTE) SARS-CoV-2 target nucleic acids are NOT DETECTED.  The SARS-CoV-2 RNA is generally detectable in upper respiratory specimens during the acute phase of infection. The lowest concentration of SARS-CoV-2 viral copies this assay can detect is 138 copies/mL. A negative result does not preclude SARS-Cov-2 infection and should not be used as the sole basis for treatment or other patient management decisions. A negative result may occur with  improper specimen collection/handling, submission of specimen other than nasopharyngeal swab, presence of viral mutation(s) within the areas targeted by this assay, and inadequate number of viral copies(<138 copies/mL). A negative result must be combined with clinical observations, patient history, and epidemiological information. The expected result is Negative.  Fact Sheet for Patients:  BloggerCourse.com  Fact Sheet for Healthcare Providers:  SeriousBroker.it  This test is no t yet approved or cleared by the [place] FDA and  has been authorized for detection and/or diagnosis of SARS-CoV-2 by FDA under an Emergency Use Authorization (EUA). This EUA will remain  in effect (meaning this test can be used) for the duration of the COVID-19 declaration under Section 564(b)(1) of the Act, 21 U.S.C.section 360bbb-3(b)(1), unless the authorization is terminated  or revoked sooner.       Influenza A by PCR NEGATIVE NEGATIVE Final   Influenza B by PCR NEGATIVE NEGATIVE Final    Comment: (NOTE) The Xpert Xpress SARS-CoV-2/FLU/RSV plus assay is intended as an aid in the diagnosis of influenza from Nasopharyngeal swab specimens and should not be used as a sole basis for treatment. Nasal washings and aspirates are unacceptable for Xpert Xpress SARS-CoV-2/FLU/RSV testing.  Fact Sheet for Patients: BloggerCourse.com  Fact Sheet for  Healthcare Providers: SeriousBroker.it  This test is not yet approved or cleared by the [place] FDA and has been authorized for detection and/or diagnosis of SARS-CoV-2 by FDA under an Emergency Use Authorization (EUA). This EUA will remain in effect (meaning this test can be used) for the duration of the COVID-19 declaration under Section 564(b)(1) of the Act, 21 U.S.C. section 360bbb-3(b)(1), unless the authorization is terminated or revoked.     Resp Syncytial Virus by PCR NEGATIVE NEGATIVE Final    Comment: (NOTE) Fact Sheet for Patients: BloggerCourse.com  Fact Sheet for Healthcare Providers: SeriousBroker.it  This test is not yet approved or cleared by the [place] FDA and has been authorized for detection and/or diagnosis of SARS-CoV-2 by FDA under an Emergency Use Authorization (EUA). This EUA will remain in effect (meaning this test can be used) for the duration of the COVID-19 declaration under Section 564(b)(1) of the Act, 21 U.S.C. section 360bbb-3(b)(1), unless the authorization is terminated or revoked.  Performed at [facility], [address] [place],  [place] [address]      Radiology Studies: No results found.  Scheduled Meds:  citalopram  40 mg Oral Daily   guaiFENesin  600 mg Oral BID   heparin  5,000 Units Subcutaneous Q8H   Continuous Infusions:  azithromycin 500 mg (05/03/23 1745)   cefTRIAXone (ROCEPHIN)  IV 2 g (05/03/23 1853)     LOS: 1 day   Time spent: 41 mins  Roseanne Juenger Laural Benes, MD How to contact the [facility] Attending or Consulting provider 7A - 7P or covering provider during after hours 7P -7A, for this patient?  Check the care team in [facility] and look for a) attending/consulting TRH provider listed and b) the [facility] team listed Log into [email or web address] and use [facility]'s universal password to access. If you do not have the password, please contact the hospital  operator. Locate the [facility] provider you are looking for under [facility] and page to a number that you can be directly reached. If you still have difficulty reaching the provider, please page the [facility] (Director on Call) for the Hospitalists listed on amion for assistance.  05/04/2023, 3:29 PM

## 2023-05-05 ENCOUNTER — Inpatient Hospital Stay (HOSPITAL_COMMUNITY): Payer: Medicare (Managed Care)

## 2023-05-05 DIAGNOSIS — M79672 Pain in left foot: Secondary | ICD-10-CM

## 2023-05-05 DIAGNOSIS — J9601 Acute respiratory failure with hypoxia: Secondary | ICD-10-CM | POA: Diagnosis not present

## 2023-05-05 LAB — LEGIONELLA PNEUMOPHILA SEROGP 1 UR AG: L. pneumophila Serogp 1 Ur Ag: NEGATIVE

## 2023-05-05 MED ORDER — AZITHROMYCIN 250 MG PO TABS
500.0000 mg | ORAL_TABLET | Freq: Every day | ORAL | Status: AC
  Administered 2023-05-05 – 2023-05-07 (×3): 500 mg via ORAL
  Filled 2023-05-05 (×3): qty 2

## 2023-05-05 NOTE — Progress Notes (Signed)
PHARMACIST - PHYSICIAN COMMUNICATION CONCERNING: Antibiotic IV to Oral Route Change Policy  RECOMMENDATION: This patient is receiving azithromycin intravenously. Based on criteria approved by the Pharmacy and Therapeutics Committee, the antibiotic(s) is/are being converted to the equivalent dose of an oral formulation.   DESCRIPTION: These criteria include: Patient being treated for a respiratory tract infection, urinary tract infection, cellulitis or Clostridioides difficile-associated diarrhea if on metronidazole. The patient is not neutropenic and does not exhibit a malabsorptive GI state. The patient is eating (either orally or via tube) and/or has been taking other orally administered medications for at least 24 hours. The patient is improving clinically and has a 24-hour Tmax of <100.5 F.  If you have questions about this conversion, please contact the Pharmacy Department:  []   [phone] )  [facility] [x]   [phone] )  [facility] []   [phone] )  [facility]  []   [phone] )  [facility]   Will M. Dareen Piano, PharmD Clinical Pharmacist 05/05/2023 4:25 PM

## 2023-05-05 NOTE — Progress Notes (Signed)
PROGRESS NOTE   Marie WOULFE  ZOX:[id number] DOB: 06-07-1941 DOA: 05/02/2023 PCP: [facility]   Chief Complaint  Patient presents with   Shortness of Breath   Level of care: Telemetry  Brief Admission History:  82 year old female with medical history significant of depression anxiety, vertigo, arthritis, presents to the ED with a chief complaint of dyspnea patient reports that she has dyspnea at baseline.  She came in today because it was worse than normal.  She is not sure when it became worse than normal.  She reports that she has a cough that is productive of minimal sputum.  She reports she has had some chest pain, but she is not sure when it started.  She is not having any chest pain at the time of my exam.  When she says she is here for dyspnea and hurting, the hernia that she is referring to is arthritis.  Patient denies any fevers.  Patient reports she tried inhalers for her dyspnea at home and they helped a little but did not relieve the symptoms.  She has not been around any sick contacts that she knows of.  Patient reports she did have COVID last month.  She has not felt quite right since.  Patient has no other complaints at this time.    Assessment and Plan:  Near syncope - secondary to dehydration and orthostatic hypotension - feeling much better after IV fluid - syncope work up has been reassuring - continue supportive measures  AKI  - prerenal - improved after IV Fluid hydration   CAP - continue azithromycin, ceftriaxone  - continue supportive measures - clinically improving  Acute respiratory failure with hypoxia  - continue supportive measures and pneumonia treatments  - improving - wean oxygen as able to room air  Left foot pain Suspect musculoskeletal Follow up xrays Schedule acetaminophen    DVT prophylaxis: SQ heparin  Code Status: Full  Family Communication:  Disposition: waiting on SNF placement    Consultants:    Procedures:   Antimicrobials:    Subjective: Pt c/o severe left foot pain, no associated injury.    Objective: Vitals:   05/04/23 2139 05/05/23 0551 05/05/23 0758 05/05/23 0844  BP: (!) 131/56 (!) 147/64 (!) 175/76 (!) 151/65  Pulse: 68 66 71 65  Resp: 18 16    Temp: 98.1 F (36.7 C) 98.2 F (36.8 C) (!) 97.4 F (36.3 C)   TempSrc: Oral Oral Oral   SpO2: 96% 92% 95%   Weight:      Height:        Intake/Output Summary (Last 24 hours) at 05/05/2023 1144 Last data filed at 05/05/2023 0539 Gross per 24 hour  Intake 908.6 ml  Output 450 ml  Net 458.6 ml   Filed Weights   05/02/23 1340 05/02/23 2235  Weight: 65.3 kg 65.3 kg   Examination:  General exam: Appears calm and comfortable  Respiratory system: rales RUL  Cardiovascular system: normal S1 & S2 heard. No JVD, murmurs, rubs, gallops or clicks. No pedal edema. Gastrointestinal system: Abdomen is nondistended, soft and nontender. No organomegaly or masses felt. Normal bowel sounds heard. Central nervous system: Alert and oriented. No focal neurological deficits. Extremities: Symmetric 5 x 5 power. Skin: No rashes, lesions or ulcers. Psychiatry: Judgement and insight appear normal. Mood & affect appropriate.   Data Reviewed: I have personally reviewed following labs and imaging studies  CBC: Recent Labs  Lab 05/02/23 1408 05/03/23 0454  WBC 8.5  12.0*  NEUTROABS 5.5 8.8*  HGB 10.3* 8.6*  HCT 31.1* 27.0*  MCV 90.4 90.6  PLT 282 231    Basic Metabolic Panel: Recent Labs  Lab 05/02/23 1408 05/03/23 0454  NA 135 135  K 3.6 3.5  CL 100 101  CO2 24 25  GLUCOSE 100* 109*  BUN 25* 21  CREATININE 1.29* 0.96  CALCIUM 9.4 8.7*  MG  --  1.7    CBG: No results for input(s): "GLUCAP" in the last 168 hours.  Recent Results (from the past 240 hour(s))  Resp panel by RT-PCR (RSV, Flu A&B, Covid) Anterior Nasal Swab     Status: None   Collection Time: 05/02/23  3:37 PM   Specimen: Anterior Nasal Swab   Result Value Ref Range Status   SARS Coronavirus 2 by RT PCR NEGATIVE NEGATIVE Final    Comment: (NOTE) SARS-CoV-2 target nucleic acids are NOT DETECTED.  The SARS-CoV-2 RNA is generally detectable in upper respiratory specimens during the acute phase of infection. The lowest concentration of SARS-CoV-2 viral copies this assay can detect is 138 copies/mL. A negative result does not preclude SARS-Cov-2 infection and should not be used as the sole basis for treatment or other patient management decisions. A negative result may occur with  improper specimen collection/handling, submission of specimen other than nasopharyngeal swab, presence of viral mutation(s) within the areas targeted by this assay, and inadequate number of viral copies(<138 copies/mL). A negative result must be combined with clinical observations, patient history, and epidemiological information. The expected result is Negative.  Fact Sheet for Patients:  BloggerCourse.com  Fact Sheet for Healthcare Providers:  SeriousBroker.it  This test is no t yet approved or cleared by the [place] FDA and  has been authorized for detection and/or diagnosis of SARS-CoV-2 by FDA under an Emergency Use Authorization (EUA). This EUA will remain  in effect (meaning this test can be used) for the duration of the COVID-19 declaration under Section 564(b)(1) of the Act, 21 U.S.C.section 360bbb-3(b)(1), unless the authorization is terminated  or revoked sooner.       Influenza A by PCR NEGATIVE NEGATIVE Final   Influenza B by PCR NEGATIVE NEGATIVE Final    Comment: (NOTE) The Xpert Xpress SARS-CoV-2/FLU/RSV plus assay is intended as an aid in the diagnosis of influenza from Nasopharyngeal swab specimens and should not be used as a sole basis for treatment. Nasal washings and aspirates are unacceptable for Xpert Xpress SARS-CoV-2/FLU/RSV testing.  Fact Sheet for  Patients: BloggerCourse.com  Fact Sheet for Healthcare Providers: SeriousBroker.it  This test is not yet approved or cleared by the [place] FDA and has been authorized for detection and/or diagnosis of SARS-CoV-2 by FDA under an Emergency Use Authorization (EUA). This EUA will remain in effect (meaning this test can be used) for the duration of the COVID-19 declaration under Section 564(b)(1) of the Act, 21 U.S.C. section 360bbb-3(b)(1), unless the authorization is terminated or revoked.     Resp Syncytial Virus by PCR NEGATIVE NEGATIVE Final    Comment: (NOTE) Fact Sheet for Patients: BloggerCourse.com  Fact Sheet for Healthcare Providers: SeriousBroker.it  This test is not yet approved or cleared by the [place] FDA and has been authorized for detection and/or diagnosis of SARS-CoV-2 by FDA under an Emergency Use Authorization (EUA). This EUA will remain in effect (meaning this test can be used) for the duration of the COVID-19 declaration under Section 564(b)(1) of the Act, 21 U.S.C. section 360bbb-3(b)(1), unless the authorization is terminated or  revoked.  Performed at [facility], [address], [place], [place] [address]      Radiology Studies: No results found.  Scheduled Meds:  citalopram  40 mg Oral Daily   guaiFENesin  600 mg Oral BID   heparin  5,000 Units Subcutaneous Q8H   Continuous Infusions:  azithromycin Stopped (05/04/23 1805)   cefTRIAXone (ROCEPHIN)  IV Stopped (05/04/23 1839)     LOS: 2 days   Time spent: 38 mins  Cayle Thunder Laural Benes, MD How to contact the [facility] Attending or Consulting provider 7A - 7P or covering provider during after hours 7P -7A, for this patient?  Check the care team in [facility] and look for a) attending/consulting TRH provider listed and b) the [facility] team listed Log into [email or web address] and use 's universal  password to access. If you do not have the password, please contact the hospital operator. Locate the [facility] provider you are looking for under [facility] and page to a number that you can be directly reached. If you still have difficulty reaching the provider, please page the [facility] (Director on Call) for the Hospitalists listed on amion for assistance.  05/05/2023, 11:44 AM

## 2023-05-05 NOTE — Plan of Care (Signed)
  Problem: Education: Goal: Knowledge of General Education information will improve Description: Including pain rating scale, medication(s)/side effects and non-pharmacologic comfort measures Outcome: Progressing   Problem: Health Behavior/Discharge Planning: Goal: Ability to manage health-related needs will improve Outcome: Progressing   Problem: Clinical Measurements: Goal: Ability to maintain clinical measurements within normal limits will improve Outcome: Progressing Goal: Will remain free from infection Outcome: Progressing Goal: Diagnostic test results will improve Outcome: Progressing Goal: Respiratory complications will improve Outcome: Progressing Goal: Cardiovascular complication will be avoided Outcome: Progressing   Problem: Nutrition: Goal: Adequate nutrition will be maintained Outcome: Progressing   Problem: Coping: Goal: Level of anxiety will decrease Outcome: Progressing   Problem: Elimination: Goal: Will not experience complications related to urinary retention Outcome: Progressing   Problem: Safety: Goal: Ability to remain free from injury will improve Outcome: Progressing   Problem: Skin Integrity: Goal: Risk for impaired skin integrity will decrease Outcome: Progressing   Problem: Education: Goal: Knowledge of disease or condition will improve Outcome: Progressing Goal: Knowledge of the prescribed therapeutic regimen will improve Outcome: Progressing Goal: Individualized Educational Video(s) Outcome: Progressing   Problem: Activity: Goal: Ability to tolerate increased activity will improve Outcome: Progressing Goal: Will verbalize the importance of balancing activity with adequate rest periods Outcome: Progressing   Problem: Respiratory: Goal: Ability to maintain a clear airway will improve Outcome: Progressing Goal: Levels of oxygenation will improve Outcome: Progressing Goal: Ability to maintain adequate ventilation will  improve Outcome: Progressing   Problem: Activity: Goal: Ability to tolerate increased activity will improve Outcome: Progressing   Problem: Clinical Measurements: Goal: Ability to maintain a body temperature in the normal range will improve Outcome: Progressing   Problem: Respiratory: Goal: Ability to maintain adequate ventilation will improve Outcome: Progressing Goal: Ability to maintain a clear airway will improve Outcome: Progressing

## 2023-05-06 DIAGNOSIS — J9601 Acute respiratory failure with hypoxia: Secondary | ICD-10-CM | POA: Diagnosis not present

## 2023-05-06 DIAGNOSIS — M79672 Pain in left foot: Secondary | ICD-10-CM | POA: Diagnosis not present

## 2023-05-06 MED ORDER — FENTANYL CITRATE PF 50 MCG/ML IJ SOSY
PREFILLED_SYRINGE | INTRAMUSCULAR | Status: AC
  Filled 2023-05-06: qty 1

## 2023-05-06 MED ORDER — ACETAMINOPHEN 325 MG PO TABS
650.0000 mg | ORAL_TABLET | Freq: Four times a day (QID) | ORAL | Status: DC
  Administered 2023-05-06 – 2023-05-08 (×8): 650 mg via ORAL
  Filled 2023-05-06 (×8): qty 2

## 2023-05-06 MED ORDER — FENTANYL CITRATE PF 50 MCG/ML IJ SOSY
25.0000 ug | PREFILLED_SYRINGE | INTRAMUSCULAR | Status: DC | PRN
  Administered 2023-05-06: 25 ug via INTRAVENOUS
  Filled 2023-05-06: qty 1

## 2023-05-06 MED ORDER — DICLOFENAC SODIUM 1 % EX GEL
4.0000 g | Freq: Four times a day (QID) | CUTANEOUS | Status: DC
  Administered 2023-05-06 – 2023-05-08 (×6): 4 g via TOPICAL
  Filled 2023-05-06 (×2): qty 100

## 2023-05-06 MED ORDER — ACETAMINOPHEN 650 MG RE SUPP: 650.0000 mg | Freq: Four times a day (QID) | RECTAL | Status: DC

## 2023-05-06 NOTE — Plan of Care (Signed)
  Problem: Education: Goal: Knowledge of General Education information will improve Description: Including pain rating scale, medication(s)/side effects and non-pharmacologic comfort measures Outcome: Progressing   Problem: Clinical Measurements: Goal: Respiratory complications will improve Outcome: Progressing   Problem: Coping: Goal: Level of anxiety will decrease Outcome: Progressing   Problem: Elimination: Goal: Will not experience complications related to urinary retention Outcome: Progressing   Problem: Safety: Goal: Ability to remain free from injury will improve Outcome: Progressing   Problem: Skin Integrity: Goal: Risk for impaired skin integrity will decrease Outcome: Progressing

## 2023-05-06 NOTE — Care Management Important Message (Signed)
Important Message  Patient Details  Name: Marie [name] MRN: [id number] Date of Birth: [birth year]   Important Message Given:  Yes - Medicare IM     Corey Harold 05/06/2023, 1:41 PM

## 2023-05-06 NOTE — TOC Progression Note (Signed)
Transition of Care [facility]) - Progression Note    Patient Details  Name: Marie [name] MRN: [id number] Date of Birth: 11-03-1940  [facility] CM/SW Contact  Leitha Bleak, RN Phone Number: 05/06/2023, 12:02 PM  Clinical Narrative: Multiple discussion with patient this morning. She is not wanting accepting SNF. She has [facility] Medicare, most of SNF's does not have a contract with them. Son has concerns with her going home, he works during the day. He said if she would agree to [place] [facility], he  would agree and check on her daily, it is very close to their home. Patient is agreeing to [facility] will start INS AUTH,   Team updated.      Expected Discharge Plan: Skilled Nursing Facility Barriers to Discharge: Insurance Authorization  Expected Discharge Plan and Services      Living arrangements for the past 2 months: Single Family Home        Social Determinants of Health (SDOH) Interventions SDOH Screenings   Food Insecurity: No Food Insecurity (04/24/2023)  Housing: Low Risk  (04/24/2023)  Transportation Needs: No Transportation Needs (04/24/2023)  Utilities: Not At Risk (04/24/2023)  Tobacco Use: Low Risk  (05/02/2023)    Readmission Risk Interventions    05/03/2023   11:39 AM  Readmission Risk Prevention Plan  Transportation Screening Complete  PCP or Specialist Appt within 5-7 Days Not Complete  Home Care Screening Complete  Medication Review (RN CM) Complete

## 2023-05-06 NOTE — Progress Notes (Signed)
Physical Therapy Treatment Patient Details Name: Marie [name] MRN: [id number] DOB: 10-16-1940 Today's Date: 05/06/2023   History of Present Illness Marie [name] is a 82 year old female with medical history significant of depression anxiety, vertigo, arthritis, presents to the ED with a chief complaint of dyspnea patient reports that she has dyspnea at baseline.  She came in today because it was worse than normal.  She is not sure when it became worse than normal.  She reports that she has a cough that is productive of minimal sputum.  She reports she has had some chest pain, but she is not sure when it started.  She is not having any chest pain at the time of my exam.  When she says she is here for dyspnea and hurting, the hernia that she is referring to is arthritis.  Patient denies any fevers.  Patient reports she tried inhalers for her dyspnea at home and they helped a little but did not relieve the symptoms.  She has not been around any sick contacts that she knows of.  Patient reports she did have COVID last month.  She has not felt quite right since.  Patient has no other complaints at this time.    PT Comments  Patient supine in bed upon therapist arrival and complaining of L > R forefoot pain and refusing activity out of bed. Agreeable to completing bed level lower extremity exercises. Then agreeable to attempting out of bed to chair transfer. Patient able to perform supine to sit and sit to supine modified independent. Patient unable to bearing enough weight through either lower extremity for sit to stand transfer. Patient performed anterior/posterior and lateral scoots to the right modified independent demonstrating increased time, labored movement and multiple attempts to complete. Patient would continue to benefit from skilled physical therapy in current environment and next venue to continue return to prior function and increase strength, endurance, balance, coordination, and functional mobility  and gait skills.      If plan is discharge home, recommend the following: A little help with bathing/dressing/bathroom;Help with stairs or ramp for entrance;Assistance with cooking/housework;A lot of help with walking and/or transfers   Can travel by private vehicle     Yes  Equipment Recommendations  None recommended by PT    Recommendations for Other Services       Precautions / Restrictions Precautions Precautions: Fall Restrictions Weight Bearing Restrictions: No     Mobility  Bed Mobility Overal bed mobility: Modified Independent             General bed mobility comments: slow, labored movement    Transfers Overall transfer level: Needs assistance Equipment used: Rolling walker (2 wheels) Transfers: Sit to/from Stand         Anterior-Posterior transfers: Supervision  Lateral/Scoot Transfers: Supervision General transfer comment: Patient unable to tolerate weight bearing through left or right lower extremity for sufficient power up; increased time, labored movement and multiple attempts for lateral scoots to right/head of bed    Ambulation/Gait         Stairs             Wheelchair Mobility     Tilt Bed    Modified Rankin (Stroke Patients Only)       Balance            Cognition Arousal: Alert Behavior During Therapy: WFL for tasks assessed/performed Overall Cognitive Status: Within Functional Limits for tasks assessed  Exercises General Exercises - Lower Extremity Gluteal Sets: 10 reps, Strengthening, Supine (bridges) Long Arc Quad: Both, Strengthening, 10 reps, Supine Heel Slides: Both, 10 reps, AAROM, Strengthening, Supine Hip ABduction/ADduction: Both, 10 reps, Strengthening, AROM, Supine Straight Leg Raises: Both, Strengthening, 10 reps, Supine Toe Raises: AROM, Strengthening, Both, 10 reps, Supine Heel Raises: AROM, Strengthening, Both, 10 reps, Supine    General Comments        Pertinent  Vitals/Pain Pain Assessment Pain Assessment: PAINAD Faces Pain Scale: Hurts even more Breathing: occasional labored breathing, short period of hyperventilation Negative Vocalization: occasional moan/groan, low speech, negative/disapproving quality Facial Expression: sad, frightened, frown Body Language: tense, distressed pacing, fidgeting Consolability: distracted or reassured by voice/touch PAINAD Score: 5 Pain Location: pain complaints in L > R forefoot with pressure or weight bearing Pain Descriptors / Indicators: Tender, Burning, Grimacing, Guarding, Moaning Pain Intervention(s): Limited activity within patient's tolerance, Monitored during session, Repositioned    Home Living         Prior Function            PT Goals (current goals can now be found in the care plan section) Acute Rehab PT Goals Patient Stated Goal: return home with family to assist PT Goal Formulation: With patient Time For Goal Achievement: 05/17/23 Potential to Achieve Goals: Good Progress towards PT goals: Not progressing toward goals - comment (patient progress limited by complaints of new L > R forefoot pain limiting weight bearing)    Frequency    Min 3X/week      PT Plan         AM-PAC PT "6 Clicks" Mobility   Outcome Measure  Help needed turning from your back to your side while in a flat bed without using bedrails?: None Help needed moving from lying on your back to sitting on the side of a flat bed without using bedrails?: A Little Help needed moving to and from a bed to a chair (including a wheelchair)?: A Lot Help needed standing up from a chair using your arms (e.g., wheelchair or bedside chair)?: A Lot Help needed to walk in hospital room?: A Lot Help needed climbing 3-5 steps with a railing? : A Lot 6 Click Score: 15    End of Session Equipment Utilized During Treatment: Gait belt Activity Tolerance: Patient tolerated treatment well;Patient limited by pain Patient left:  with call bell/phone within reach;in bed;with bed alarm set Nurse Communication: Mobility status PT Visit Diagnosis: Unsteadiness on feet (R26.81);Other abnormalities of gait and mobility (R26.89);Muscle weakness (generalized) (M62.81)     Time: 1610-9604 PT Time Calculation (min) (ACUTE ONLY): 25 min  Charges:    $Therapeutic Exercise: 8-22 mins $Therapeutic Activity: 8-22 mins PT General Charges $$ ACUTE PT VISIT: 1 Visit                     Katina Dung. Hartnett-Rands, MS, PT [facility] PT [facility] [place] [phone]  Britta Mccreedy  Hartnett-Rands 05/06/2023, 12:38 PM

## 2023-05-06 NOTE — Plan of Care (Signed)
  Problem: Coping: Goal: Level of anxiety will decrease Outcome: Progressing   Problem: Skin Integrity: Goal: Risk for impaired skin integrity will decrease Outcome: Progressing   

## 2023-05-06 NOTE — Progress Notes (Signed)
PROGRESS NOTE   Marie [name]  QIH:[id number] DOB: [birth year] DOA: 05/02/2023 PCP: [facility]   Chief Complaint  Patient presents with   Shortness of Breath   Level of care: Telemetry  Brief Admission History:  82 year old female with medical history significant of depression anxiety, vertigo, arthritis, presents to the ED with a chief complaint of dyspnea patient reports that she has dyspnea at baseline.  She came in today because it was worse than normal.  She is not sure when it became worse than normal.  She reports that she has a cough that is productive of minimal sputum.  She reports she has had some chest pain, but she is not sure when it started.  She is not having any chest pain at the time of my exam.  When she says she is here for dyspnea and hurting, the hernia that she is referring to is arthritis.  Patient denies any fevers.  Patient reports she tried inhalers for her dyspnea at home and they helped a little but did not relieve the symptoms.  She has not been around any sick contacts that she knows of.  Patient reports she did have COVID last month.  She has not felt quite right since.  Patient has no other complaints at this time.    Assessment and Plan:  Near syncope - secondary to dehydration and orthostatic hypotension - feeling much better after IV fluid - syncope work up has been reassuring - continue supportive measures  AKI  - prerenal - improved after IV Fluid hydration   CAP - continue azithromycin, ceftriaxone  - continue supportive measures - clinically improved  Acute respiratory failure with hypoxia  - continue supportive measures and pneumonia treatments  - improving - wean oxygen as able to room air  Left foot pain Suspect musculoskeletal Follow up xrays Schedule acetaminophen    DVT prophylaxis: SQ heparin  Code Status: Full  Family Communication:  Disposition: waiting on SNF placement    Consultants:    Procedures:   Antimicrobials:    Subjective: Pt still having muscle cramps in feet/legs.    Objective: Vitals:   05/05/23 2148 05/06/23 0428 05/06/23 1415 05/06/23 1524  BP: (!) 163/72 (!) 144/56 139/60 137/62  Pulse: 78 76 74 72  Resp: 20 16 18    Temp: 98.8 F (37.1 C) 98.1 F (36.7 C) 98 F (36.7 C) 98.4 F (36.9 C)  TempSrc: Oral Oral Oral Oral  SpO2: 95% 94% 99% 95%  Weight:      Height:        Intake/Output Summary (Last 24 hours) at 05/06/2023 1715 Last data filed at 05/06/2023 1429 Gross per 24 hour  Intake 240 ml  Output 400 ml  Net -160 ml   Filed Weights   05/02/23 1340 05/02/23 2235  Weight: 65.3 kg 65.3 kg   Examination:  General exam: Appears calm and comfortable  Respiratory system: rales RUL  Cardiovascular system: normal S1 & S2 heard. No JVD, murmurs, rubs, gallops or clicks. No pedal edema. Gastrointestinal system: Abdomen is nondistended, soft and nontender. No organomegaly or masses felt. Normal bowel sounds heard. Central nervous system: Alert and oriented. No focal neurological deficits. Extremities: Symmetric 5 x 5 power. Skin: No rashes, lesions or ulcers. Psychiatry: Judgement and insight appear normal. Mood & affect appropriate.   Data Reviewed: I have personally reviewed following labs and imaging studies  CBC: Recent Labs  Lab 05/02/23 1408 05/03/23 0454  WBC 8.5 12.0*  NEUTROABS 5.5 8.8*  HGB 10.3* 8.6*  HCT 31.1* 27.0*  MCV 90.4 90.6  PLT 282 231    Basic Metabolic Panel: Recent Labs  Lab 05/02/23 1408 05/03/23 0454  NA 135 135  K 3.6 3.5  CL 100 101  CO2 24 25  GLUCOSE 100* 109*  BUN 25* 21  CREATININE 1.29* 0.96  CALCIUM 9.4 8.7*  MG  --  1.7    CBG: No results for input(s): "GLUCAP" in the last 168 hours.  Recent Results (from the past 240 hour(s))  Resp panel by RT-PCR (RSV, Flu A&B, Covid) Anterior Nasal Swab     Status: None   Collection Time: 05/02/23  3:37 PM   Specimen: Anterior Nasal Swab   Result Value Ref Range Status   SARS Coronavirus 2 by RT PCR NEGATIVE NEGATIVE Final    Comment: (NOTE) SARS-CoV-2 target nucleic acids are NOT DETECTED.  The SARS-CoV-2 RNA is generally detectable in upper respiratory specimens during the acute phase of infection. The lowest concentration of SARS-CoV-2 viral copies this assay can detect is 138 copies/mL. A negative result does not preclude SARS-Cov-2 infection and should not be used as the sole basis for treatment or other patient management decisions. A negative result may occur with  improper specimen collection/handling, submission of specimen other than nasopharyngeal swab, presence of viral mutation(s) within the areas targeted by this assay, and inadequate number of viral copies(<138 copies/mL). A negative result must be combined with clinical observations, patient history, and epidemiological information. The expected result is Negative.  Fact Sheet for Patients:  BloggerCourse.com  Fact Sheet for Healthcare Providers:  SeriousBroker.it  This test is no t yet approved or cleared by the [place] FDA and  has been authorized for detection and/or diagnosis of SARS-CoV-2 by FDA under an Emergency Use Authorization (EUA). This EUA will remain  in effect (meaning this test can be used) for the duration of the COVID-19 declaration under Section 564(b)(1) of the Act, 21 U.S.C.section 360bbb-3(b)(1), unless the authorization is terminated  or revoked sooner.       Influenza A by PCR NEGATIVE NEGATIVE Final   Influenza B by PCR NEGATIVE NEGATIVE Final    Comment: (NOTE) The Xpert Xpress SARS-CoV-2/FLU/RSV plus assay is intended as an aid in the diagnosis of influenza from Nasopharyngeal swab specimens and should not be used as a sole basis for treatment. Nasal washings and aspirates are unacceptable for Xpert Xpress SARS-CoV-2/FLU/RSV testing.  Fact Sheet for  Patients: BloggerCourse.com  Fact Sheet for Healthcare Providers: SeriousBroker.it  This test is not yet approved or cleared by the [place] FDA and has been authorized for detection and/or diagnosis of SARS-CoV-2 by FDA under an Emergency Use Authorization (EUA). This EUA will remain in effect (meaning this test can be used) for the duration of the COVID-19 declaration under Section 564(b)(1) of the Act, 21 U.S.C. section 360bbb-3(b)(1), unless the authorization is terminated or revoked.     Resp Syncytial Virus by PCR NEGATIVE NEGATIVE Final    Comment: (NOTE) Fact Sheet for Patients: BloggerCourse.com  Fact Sheet for Healthcare Providers: SeriousBroker.it  This test is not yet approved or cleared by the [place] FDA and has been authorized for detection and/or diagnosis of SARS-CoV-2 by FDA under an Emergency Use Authorization (EUA). This EUA will remain in effect (meaning this test can be used) for the duration of the COVID-19 declaration under Section 564(b)(1) of the Act, 21 U.S.C. section 360bbb-3(b)(1), unless the authorization is terminated or revoked.  Performed at [facility], [address], [place], [place] [address]      Radiology Studies: DG Foot 2 Views Left  Result Date: 05/05/2023 CLINICAL DATA:  Left foot pain EXAM: LEFT FOOT - 2 VIEW COMPARISON:  None Available. FINDINGS: There are no signs of acute fracture or dislocation. Medial deviation of the second and third toes with third through fifth hammertoe deformities. Remote postoperative changes identified involving the distal shaft of the first metatarsal bone. Scattered degenerative changes are noted involving the first MTP joint and IP joints of the toes. Small plantar heel spur. IMPRESSION: 1. No acute findings. 2. Medial deviation of the second and third toes with third through fifth hammertoe  deformities. 3. Remote postoperative changes involving the first metatarsal bone. Electronically Signed   By: Signa Kell M.D.   On: 05/05/2023 12:27    Scheduled Meds:  acetaminophen  650 mg Oral Q6H   Or   acetaminophen  650 mg Rectal Q6H   azithromycin  500 mg Oral Daily   citalopram  40 mg Oral Daily   guaiFENesin  600 mg Oral BID   heparin  5,000 Units Subcutaneous Q8H   Continuous Infusions:  cefTRIAXone (ROCEPHIN)  IV 2 g (05/05/23 1739)     LOS: 3 days   Time spent: 35 mins  Lovel Suazo Laural Benes, MD How to contact the [facility] Attending or Consulting provider 7A - 7P or covering provider during after hours 7P -7A, for this patient?  Check the care team in [facility] and look for a) attending/consulting TRH provider listed and b) the [facility] team listed Log into [email or web address] and use [place] universal password to access. If you do not have the password, please contact the hospital operator. Locate the [facility] provider you are looking for under [facility] and page to a number that you can be directly reached. If you still have difficulty reaching the provider, please page the [facility] (Director on Call) for the Hospitalists listed on amion for assistance.  05/06/2023, 5:15 PM

## 2023-05-07 DIAGNOSIS — J9601 Acute respiratory failure with hypoxia: Secondary | ICD-10-CM | POA: Diagnosis not present

## 2023-05-07 DIAGNOSIS — R55 Syncope and collapse: Secondary | ICD-10-CM | POA: Diagnosis not present

## 2023-05-07 DIAGNOSIS — J189 Pneumonia, unspecified organism: Secondary | ICD-10-CM | POA: Diagnosis not present

## 2023-05-07 MED ORDER — DICLOFENAC SODIUM 1 % EX GEL: 4.0000 g | Freq: Four times a day (QID) | CUTANEOUS | Status: AC | PRN

## 2023-05-07 MED ORDER — CLONAZEPAM 1 MG PO TABS: 1.0000 mg | ORAL_TABLET | Freq: Two times a day (BID) | ORAL | 0 refills | Status: DC | PRN

## 2023-05-07 MED ORDER — ALBUTEROL SULFATE (2.5 MG/3ML) 0.083% IN NEBU: 2.5000 mg | INHALATION_SOLUTION | RESPIRATORY_TRACT | Status: DC | PRN

## 2023-05-07 MED ORDER — DEXTROMETHORPHAN POLISTIREX ER 30 MG/5ML PO SUER: 30.0000 mg | Freq: Two times a day (BID) | ORAL | Status: DC | PRN

## 2023-05-07 MED ORDER — OXYCODONE HCL 5 MG PO TABS: 5.0000 mg | ORAL_TABLET | Freq: Four times a day (QID) | ORAL | 0 refills | Status: DC | PRN

## 2023-05-07 MED ORDER — ONDANSETRON HCL 4 MG PO TABS: 4.0000 mg | ORAL_TABLET | Freq: Three times a day (TID) | ORAL | Status: DC | PRN

## 2023-05-07 MED ORDER — LOSARTAN POTASSIUM 25 MG PO TABS: 25.0000 mg | ORAL_TABLET | ORAL | Status: DC

## 2023-05-07 MED ORDER — ACETAMINOPHEN 325 MG PO TABS: 650.0000 mg | ORAL_TABLET | Freq: Three times a day (TID) | ORAL | Status: DC

## 2023-05-07 MED ORDER — DOXYCYCLINE HYCLATE 100 MG PO CAPS: 100.0000 mg | ORAL_CAPSULE | Freq: Two times a day (BID) | ORAL | Status: AC

## 2023-05-07 NOTE — Discharge Instructions (Signed)
IMPORTANT INFORMATION: PAY CLOSE ATTENTION   PHYSICIAN DISCHARGE INSTRUCTIONS  Follow with Primary care provider  [facility]  and other consultants as instructed by your Hospitalist Physician  [place] IF SYMPTOMS COME BACK, WORSEN OR NEW PROBLEM DEVELOPS   Please note: You were cared for by a hospitalist during your hospital stay. Every effort will be made to forward records to your primary care provider.  You can request that your primary care provider send for your hospital records if they have not received them.  Once you are discharged, your primary care physician will handle any further medical issues. Please note that NO REFILLS for any discharge medications will be authorized once you are discharged, as it is imperative that you return to your primary care physician (or establish a relationship with a primary care physician if you do not have one) for your post hospital discharge needs so that they can reassess your need for medications and monitor your lab values.  Please get a complete blood count and chemistry panel checked by your Primary MD at your next visit, and again as instructed by your Primary MD.  Get Medicines reviewed and adjusted: Please take all your medications with you for your next visit with your Primary MD  Laboratory/radiological data: Please request your Primary MD to go over all hospital tests and procedure/radiological results at the follow up, please ask your primary care provider to get all Hospital records sent to his/her office.  In some cases, they will be blood work, cultures and biopsy results pending at the time of your discharge. Please request that your primary care provider follow up on these results.  If you are diabetic, please bring your blood sugar readings with you to your follow up appointment with primary care.    Please call and make your follow up appointments as soon as  possible.    Also Note the following: If you experience worsening of your admission symptoms, develop shortness of breath, life threatening emergency, suicidal or homicidal thoughts you must seek medical attention immediately by calling 911 or calling your MD immediately  if symptoms less severe.  You must read complete instructions/literature along with all the possible adverse reactions/side effects for all the Medicines you take and that have been prescribed to you. Take any new Medicines after you have completely understood and accpet all the possible adverse reactions/side effects.   Do not drive when taking Pain medications or sleeping medications (Benzodiazepines)  Do not take more than prescribed Pain, Sleep and Anxiety Medications. It is not advisable to combine anxiety,sleep and pain medications without talking with your primary care practitioner  Special Instructions: If you have smoked or chewed Tobacco  in the last 2 yrs please stop smoking, stop any regular Alcohol  and or any Recreational drug use.  Wear Seat belts while driving.  Do not drive if taking any narcotic, mind altering or controlled substances or recreational drugs or alcohol.

## 2023-05-07 NOTE — Progress Notes (Signed)
Report called to [facility] LPN at [facility] rehab

## 2023-05-07 NOTE — Progress Notes (Signed)
[facility] [place] to see if patient could be returned at 2300 via EMS. Speaker on phone stated that patient could not return at this time due to lateness and should stay until the morning. Patient was to be a new admission to facility.  Speaker on phone did state she spoke to the director of the facility before this information was relayed back to me.

## 2023-05-07 NOTE — TOC Transition Note (Signed)
Transition of Care [facility]) - CM/SW Discharge Note   Patient Details  Name: Marie [name] MRN: [id number] Date of Birth: 06/16/1941  [facility] CM/SW Contact:  Leitha Bleak, RN Phone Number: 05/07/2023, 10:28 AM   Clinical Narrative   INS AUTH received, [name] provided room number, RN calling report. TOC updated her son and scheduled EMS.  DC summary and transfer report sent in the hub.    Final next level of care: Skilled Nursing Facility Barriers to Discharge: Barriers Resolved   Patient Goals and CMS Choice CMS Medicare.gov Compare Post Acute Care list provided to:: Patient Choice offered to / list presented to : Patient  Discharge Placement               [facility] at:  [name]) Patient to be transferred to facility by: EMS Name of family member notified: SON Patient and family notified of of transfer: 05/07/23  Discharge Plan and Services Additional resources added to the After Visit Summary for          Social Determinants of Health (SDOH) Interventions SDOH Screenings   Food Insecurity: No Food Insecurity (04/24/2023)  Housing: Low Risk  (04/24/2023)  Transportation Needs: No Transportation Needs (04/24/2023)  Utilities: Not At Risk (04/24/2023)  Tobacco Use: Low Risk  (05/02/2023)     Readmission Risk Interventions    05/07/2023   10:24 AM 05/03/2023   11:39 AM  Readmission Risk Prevention Plan  Post Dischage Appt Complete   Medication Screening Complete   Transportation Screening Complete Complete  PCP or Specialist Appt within 5-7 Days  Not Complete  Home Care Screening  Complete  Medication Review (RN CM)  Complete

## 2023-05-07 NOTE — Plan of Care (Signed)
  Problem: Education: Goal: Knowledge of General Education information will improve Description: Including pain rating scale, medication(s)/side effects and non-pharmacologic comfort measures 05/07/2023 1110 by Vergia Alcon, RN Outcome: Adequate for Discharge 05/07/2023 1109 by Vergia Alcon, RN Outcome: Adequate for Discharge   Problem: Health Behavior/Discharge Planning: Goal: Ability to manage health-related needs will improve 05/07/2023 1110 by Vergia Alcon, RN Outcome: Adequate for Discharge 05/07/2023 1109 by Vergia Alcon, RN Outcome: Adequate for Discharge   Problem: Clinical Measurements: Goal: Ability to maintain clinical measurements within normal limits will improve 05/07/2023 1110 by Vergia Alcon, RN Outcome: Adequate for Discharge 05/07/2023 1109 by Vergia Alcon, RN Outcome: Adequate for Discharge Goal: Will remain free from infection 05/07/2023 1110 by Vergia Alcon, RN Outcome: Adequate for Discharge 05/07/2023 1109 by Vergia Alcon, RN Outcome: Adequate for Discharge Goal: Diagnostic test results will improve 05/07/2023 1110 by Vergia Alcon, RN Outcome: Adequate for Discharge 05/07/2023 1109 by Vergia Alcon, RN Outcome: Adequate for Discharge Goal: Respiratory complications will improve 05/07/2023 1110 by Vergia Alcon, RN Outcome: Adequate for Discharge 05/07/2023 1109 by Vergia Alcon, RN Outcome: Adequate for Discharge Goal: Cardiovascular complication will be avoided 05/07/2023 1110 by Vergia Alcon, RN Outcome: Adequate for Discharge 05/07/2023 1109 by Vergia Alcon, RN Outcome: Adequate for Discharge

## 2023-05-07 NOTE — Discharge Summary (Signed)
Physician Discharge Summary  Marie [name] AYT:[id number] DOB: [birth year] DOA: 05/02/2023  PCP: [facility]  Admit date: 05/02/2023 Discharge date: 05/07/2023  Disposition:  SNF  Recommendations for Outpatient Follow-up:  Follow up with PCP in 2 weeks Follow up with Dr. Jena Gauss on 05/21/23 as scheduled Please obtain BMP in 1-2 weeks to follow up electrolytes  Discharge Condition: STABLE   CODE STATUS: FULL DIET: Heart healthy   Brief Hospitalization Summary: Please see all hospital notes, images, labs for full details of the hospitalization. ADMISSION PROVIDER HPI:  82 year old female with medical history significant of depression anxiety, vertigo, arthritis, presents to the ED with a chief complaint of dyspnea patient reports that she has dyspnea at baseline.  She came in today because it was worse than normal.  She is not sure when it became worse than normal.  She reports that she has a cough that is productive of minimal sputum.  She reports she has had some chest pain, but she is not sure when it started.  She is not having any chest pain at the time of my exam.  When she says she is here for dyspnea and hurting, the hernia that she is referring to is arthritis.  Patient denies any fevers.  Patient reports she tried inhalers for her dyspnea at home and they helped a little but did not relieve the symptoms.  She has not been around any sick contacts that she knows of.  Patient reports she did have COVID last month.  She has not felt quite right since.  Patient has no other complaints at this time.   Hospital Course by Problem List   Near syncope - secondary to dehydration and orthostatic hypotension - feeling much better after IV fluid - syncope work up has been reassuring - continue supportive measures   AKI - RESOLVED - prerenal - improved after IV Fluid hydration    CAP - treated with azithromycin, ceftriaxone  - treated with supportive measures -  clinically improved - complete 2 more days of oral doxycycline at DC   Acute respiratory failure with hypoxia  - continue supportive measures and pneumonia treatments  - improved - weaned oxygen as able to room air   Left foot pain Suspect musculoskeletal cause  Follow up xrays- no acute bony findings seen  Schedule acetaminophen and topical diclofenac as needed Ambulate with PT   Discharge Diagnoses:  Principal Problem:   Acute respiratory failure with hypoxia (HCC) Active Problems:   Atypical pneumonia   Elevated troponin   Near syncope   Discharge Instructions:  Allergies as of 05/07/2023       Reactions   Codeine Hives, Swelling   Promethazine Hcl    Other Reaction(s): swell up        Medication List     STOP taking these medications    hydrochlorothiazide 12.5 MG tablet Commonly known as: HYDRODIURIL   nitrofurantoin (macrocrystal-monohydrate) 100 MG capsule Commonly known as: MACROBID   traMADol 50 MG tablet Commonly known as: ULTRAM       TAKE these medications    acetaminophen 325 MG tablet Commonly known as: TYLENOL Take 2 tablets (650 mg total) by mouth in the morning, at noon, and at bedtime.   albuterol (2.5 MG/3ML) 0.083% nebulizer solution Commonly known as: PROVENTIL Take 3 mLs (2.5 mg total) by nebulization every 4 (four) hours as needed for wheezing or shortness of breath.   CALCIUM PO Take 1 tablet by mouth every morning.  CENTRUM SILVER ULTRA WOMENS PO Take 1 tablet by mouth every morning.   citalopram 40 MG tablet Commonly known as: CELEXA Take 40 mg by mouth daily.   clonazePAM 1 MG tablet Commonly known as: KlonoPIN Take 1 tablet (1 mg total) by mouth 2 (two) times daily as needed for anxiety. What changed: when to take this   cyanocobalamin 1000 MCG tablet Commonly known as: VITAMIN B12 Take 1,000 mcg by mouth daily.   dextromethorphan 30 MG/5ML liquid Commonly known as: DELSYM Take 5 mLs (30 mg total) by mouth  2 (two) times daily as needed for cough.   diclofenac Sodium 1 % Gel Commonly known as: VOLTAREN Apply 4 g topically 4 (four) times daily as needed (joint and musculoskeletal pain). Apply to affected areas   doxycycline 100 MG capsule Commonly known as: VIBRAMYCIN Take 1 capsule (100 mg total) by mouth 2 (two) times daily for 2 days.   esomeprazole 40 MG capsule Commonly known as: NEXIUM Take 40 mg by mouth daily.   Fish Oil 1000 MG Caps Take 2 capsules by mouth daily.   hyoscyamine 0.125 MG SL tablet Commonly known as: LEVSIN SL Take 0.25 mg by mouth 3 (three) times daily as needed for cramping.   losartan 25 MG tablet Commonly known as: COZAAR Take 1 tablet (25 mg total) by mouth every morning. What changed:  medication strength how much to take   [facility] ALLERGY PO Take 1 tablet by mouth daily.   ondansetron 4 MG tablet Commonly known as: ZOFRAN Take 1 tablet (4 mg total) by mouth every 8 (eight) hours as needed for nausea or vomiting.   oxyCODONE 5 MG immediate release tablet Commonly known as: Oxy IR/ROXICODONE Take 1 tablet (5 mg total) by mouth every 6 (six) hours as needed for severe pain.   VITAMIN C PO Take 1 tablet by mouth every morning.        Contact information for follow-up providers     Rourk, Gerrit Friends, MD. Go on 05/21/2023.   Specialty: Gastroenterology Contact information: [address] [place] [address] [phone]         [facility] Schedule an appointment as soon as possible for a visit in 2 week(s).   Why: Hospital Follow Up Contact information: [address] [place] [address] [phone]              Contact information for after-discharge care     Destination     [facility] .   Service: Skilled Nursing Contact information: [address] [place] [address] [phone]                    Allergies  Allergen  Reactions   Codeine Hives and Swelling   Promethazine Hcl     Other Reaction(s): swell up   Allergies as of 05/07/2023       Reactions   Codeine Hives, Swelling   Promethazine Hcl    Other Reaction(s): swell up        Medication List     STOP taking these medications    hydrochlorothiazide 12.5 MG tablet Commonly known as: HYDRODIURIL   nitrofurantoin (macrocrystal-monohydrate) 100 MG capsule Commonly known as: MACROBID   traMADol 50 MG tablet Commonly known as: ULTRAM       TAKE these medications    acetaminophen 325 MG tablet Commonly known as: TYLENOL Take 2 tablets (650 mg total) by mouth in the morning, at noon,  and at bedtime.   albuterol (2.5 MG/3ML) 0.083% nebulizer solution Commonly known as: PROVENTIL Take 3 mLs (2.5 mg total) by nebulization every 4 (four) hours as needed for wheezing or shortness of breath.   CALCIUM PO Take 1 tablet by mouth every morning.   CENTRUM SILVER ULTRA WOMENS PO Take 1 tablet by mouth every morning.   citalopram 40 MG tablet Commonly known as: CELEXA Take 40 mg by mouth daily.   clonazePAM 1 MG tablet Commonly known as: KlonoPIN Take 1 tablet (1 mg total) by mouth 2 (two) times daily as needed for anxiety. What changed: when to take this   cyanocobalamin 1000 MCG tablet Commonly known as: VITAMIN B12 Take 1,000 mcg by mouth daily.   dextromethorphan 30 MG/5ML liquid Commonly known as: DELSYM Take 5 mLs (30 mg total) by mouth 2 (two) times daily as needed for cough.   diclofenac Sodium 1 % Gel Commonly known as: VOLTAREN Apply 4 g topically 4 (four) times daily as needed (joint and musculoskeletal pain). Apply to affected areas   doxycycline 100 MG capsule Commonly known as: VIBRAMYCIN Take 1 capsule (100 mg total) by mouth 2 (two) times daily for 2 days.   esomeprazole 40 MG capsule Commonly known as: NEXIUM Take 40 mg by mouth daily.   Fish Oil 1000 MG Caps Take 2 capsules by mouth daily.    hyoscyamine 0.125 MG SL tablet Commonly known as: LEVSIN SL Take 0.25 mg by mouth 3 (three) times daily as needed for cramping.   losartan 25 MG tablet Commonly known as: COZAAR Take 1 tablet (25 mg total) by mouth every morning. What changed:  medication strength how much to take   [facility] ALLERGY PO Take 1 tablet by mouth daily.   ondansetron 4 MG tablet Commonly known as: ZOFRAN Take 1 tablet (4 mg total) by mouth every 8 (eight) hours as needed for nausea or vomiting.   oxyCODONE 5 MG immediate release tablet Commonly known as: Oxy IR/ROXICODONE Take 1 tablet (5 mg total) by mouth every 6 (six) hours as needed for severe pain.   VITAMIN C PO Take 1 tablet by mouth every morning.        Procedures/Studies: DG Foot 2 Views Left  Result Date: 05/05/2023 CLINICAL DATA:  Left foot pain EXAM: LEFT FOOT - 2 VIEW COMPARISON:  None Available. FINDINGS: There are no signs of acute fracture or dislocation. Medial deviation of the second and third toes with third through fifth hammertoe deformities. Remote postoperative changes identified involving the distal shaft of the first metatarsal bone. Scattered degenerative changes are noted involving the first MTP joint and IP joints of the toes. Small plantar heel spur. IMPRESSION: 1. No acute findings. 2. Medial deviation of the second and third toes with third through fifth hammertoe deformities. 3. Remote postoperative changes involving the first metatarsal bone. Electronically Signed   By: Signa Kell M.D.   On: 05/05/2023 12:27   DG Chest 2 View  Result Date: 05/02/2023 CLINICAL DATA:  Shortness of breath. EXAM: CHEST - 2 VIEW COMPARISON:  04/24/2023 FINDINGS: Midline trachea. Normal heart size. Atherosclerosis in the transverse aorta. No pleural effusion or pneumothorax. Basilar predominant, bilateral interstitial thickening is new or significantly progressive. No well-defined lobar consolidation. Degenerative changes of the right  greater than left shoulders. IMPRESSION: Moderate lower lung predominant interstitial thickening is new or increased and suspicious for viral or atypical/mycoplasma pneumonia. Mild pulmonary venous congestion is felt less likely, given lack of pleural fluid. Aortic Atherosclerosis (  ICD10-I70.0). Electronically Signed   By: Jeronimo Greaves M.D.   On: 05/02/2023 16:13   EEG adult  Result Date: 04/25/2023 Charlsie Quest, MD     04/25/2023  3:49 PM Patient Name: Marie [name] MRN: [id number] Epilepsy Attending: Charlsie Quest Referring Physician/Provider: Onnie Boer, MD Date: 04/25/2023 Duration: 22.20 mins Patient history: 82 year old female with 2 episodes of transient aphasia, numbness on left side of face, numbness in bilateral arms up to the elbows as well as some jerking movements. EEG to evaluate for seizure Level of alertness: Awake, asleep AEDs during EEG study: None Technical aspects: This EEG study was done with scalp electrodes positioned according to the 10-20 International system of electrode placement. Electrical activity was reviewed with band pass filter of 1-70Hz , sensitivity of 7 uV/mm, display speed of 29mm/sec with a 60Hz  notched filter applied as appropriate. EEG data were recorded continuously and digitally stored.  Video monitoring was available and reviewed as appropriate. Description: The posterior dominant rhythm consists of 8-9 Hz activity of moderate voltage (25-35 uV) seen predominantly in posterior head regions, symmetric and reactive to eye opening and eye closing. Sleep was characterized by vertex waves, sleep spindles (12 to 14 Hz), maximal frontocentral region. Hyperventilation and photic stimulation were not performed.   IMPRESSION: This study is within normal limits. No seizures or epileptiform discharges were seen throughout the recording. A normal interictal EEG does not exclude the diagnosis of epilepsy. Charlsie Quest   CT ABDOMEN PELVIS W CONTRAST  Result  Date: 04/24/2023 CLINICAL DATA:  Diffuse abdominal pain EXAM: CT ABDOMEN AND PELVIS WITH CONTRAST TECHNIQUE: Multidetector CT imaging of the abdomen and pelvis was performed using the standard protocol following bolus administration of intravenous contrast. RADIATION DOSE REDUCTION: This exam was performed according to the departmental dose-optimization program which includes automated exposure control, adjustment of the mA and/or kV according to patient size and/or use of iterative reconstruction technique. CONTRAST:  80mL OMNIPAQUE IOHEXOL 300 MG/ML  SOLN COMPARISON:  12/04/2021 FINDINGS: Lower chest: Mild emphysematous changes are noted. No focal infiltrate is seen. Hepatobiliary: No focal liver abnormality is seen. No gallstones, gallbladder wall thickening, or biliary dilatation. Pancreas: Unremarkable. No pancreatic ductal dilatation or surrounding inflammatory changes. Spleen: Normal in size without focal abnormality. Adrenals/Urinary Tract: Adrenal glands are within normal limits. Kidneys demonstrate a normal enhancement pattern. No renal calculi or obstructive changes are noted. Large right renal cyst is noted exophytic from the lower pole measuring up to 5 cm. This is stable in appearance from the prior exam. No follow-up is recommended. No obstructive changes are seen. The bladder is well distended. Stomach/Bowel: Scattered diverticular changes noted without evidence of diverticulitis. No obstructive or inflammatory changes of the colon are seen. A few loops of small bowel are noted within a spigelian hernia anteriorly on the right. No obstructive changes are noted. Small bowel is otherwise unremarkable. Stomach shows a large sliding-type hiatal hernia. Vascular/Lymphatic: Aortic atherosclerosis. No enlarged abdominal or pelvic lymph nodes. Reproductive: Status post hysterectomy. No adnexal masses. Other: Fat containing umbilical hernia is noted anteriorly. No abdominopelvic ascites. Musculoskeletal:  Degenerative changes of lumbar spine are noted. No acute bony abnormality is seen. IMPRESSION: Diverticulosis without diverticulitis. Right-sided spigelian hernia containing a few loops of small bowel without obstructive change. Small fat containing umbilical hernia. Electronically Signed   By: Alcide Clever M.D.   On: 04/24/2023 21:14   MR BRAIN WO CONTRAST  Result Date: 04/24/2023 CLINICAL DATA:  Transient ischemic attack, seizure EXAM: MRI HEAD  WITHOUT CONTRAST TECHNIQUE: Multiplanar, multiecho pulse sequences of the brain and surrounding structures were obtained without intravenous contrast. COMPARISON:  No prior MRI available, correlation is made with 04/24/2023 CT head FINDINGS: Evaluation is limited as the patient refused to complete the study. The axial T1 and coronal T2 sequences could not be obtained. Evaluation is also limited by motion. Brain: No restricted diffusion to suggest acute or subacute infarct. No acute hemorrhage, mass, mass effect, or midline shift. No hydrocephalus or extra-axial collection. Partial empty sella. Normal craniocervical junction. No hemosiderin deposition to suggest remote hemorrhage. T2 hyperintense signal in the periventricular white matter, likely the sequela of chronic small vessel ischemic disease. Vascular: Unable to evaluate. Skull and upper cervical spine: Grossly normal marrow signal. Sinuses/Orbits: Clear paranasal sinuses. No acute finding in the orbits. Status post bilateral lens replacements. Other: The mastoid air cells are well aerated. IMPRESSION: Evaluation is limited as the patient refused to complete the study, as well as by motion. Within these limitations, there is no acute intracranial process. No evidence of acute or subacute infarct. Electronically Signed   By: Wiliam Ke M.D.   On: 04/24/2023 20:03   DG Chest Port 1 View  Result Date: 04/24/2023 CLINICAL DATA:  Shortness of breath. EXAM: PORTABLE CHEST 1 VIEW COMPARISON:  12/10/2022. FINDINGS:  Bilateral lung fields are clear. There are probable atelectatic changes in the left retrocardiac region. Apparent faint opacification of left lung base is likely secondary to overlying soft tissue/breast shadow. Bilateral costophrenic angles are clear. Normal cardio-mediastinal silhouette. No acute osseous abnormalities. The soft tissues are within normal limits. IMPRESSION: 1. Probable left retrocardiac atelectasis. Electronically Signed   By: Jules Schick M.D.   On: 04/24/2023 18:44   CT HEAD CODE STROKE WO CONTRAST  Result Date: 04/24/2023 CLINICAL DATA:  Code stroke.  Neuro deficit, acute, stroke suspected EXAM: CT HEAD WITHOUT CONTRAST TECHNIQUE: Contiguous axial images were obtained from the base of the skull through the vertex without intravenous contrast. RADIATION DOSE REDUCTION: This exam was performed according to the departmental dose-optimization program which includes automated exposure control, adjustment of the mA and/or kV according to patient size and/or use of iterative reconstruction technique. COMPARISON:  CT head 02/07/2022. FINDINGS: Brain: No evidence of acute infarction, hemorrhage, hydrocephalus, extra-axial collection or mass lesion/mass effect. Patchy white matter hypodensities, nonspecific but compatible with chronic microvascular ischemic change. Vascular: No hyperdense vessel. Skull: No acute fracture. Sinuses/Orbits: No acute finding. Other: No mastoid effusions. ASPECTS [facility] Stroke Program Early CT Score) total score (0-10 with 10 being normal): 10. IMPRESSION: 1. No evidence of acute intracranial abnormality. 2. ASPECTS is 10. Code stroke imaging results were communicated on 04/24/2023 at 5:20 pm to provider NANAVATI via telephone, who verbally acknowledged these results. Electronically Signed   By: Feliberto Harts M.D.   On: 04/24/2023 17:21   US RENAL  Result Date: 04/19/2023 CLINICAL DATA:  Left flank pain.  Recurrent UTIs. EXAM: RENAL / URINARY TRACT ULTRASOUND  COMPLETE COMPARISON:  CT abdomen and pelvis Dec 04, 2021 FINDINGS: Right Kidney: Renal measurements: 7.6 x 3.9 x 4.1 cm = volume: 63.3 mL. Echogenicity within normal limits. No mass or hydronephrosis visualized. Left Kidney: Renal measurements: 8.6 x 4.2 x 4.2 cm = volume: 79.8 mL. Echogenicity within normal limits. No mass or hydronephrosis visualized. Bladder: Patient recently voided, the bladder is decompressed, unable to visualize. Other: None. IMPRESSION: 1. No acute abnormality identified. 2. Patient recently voided, the bladder is decompressed, unable to visualize. Electronically Signed   By: Scherry Ran  Juel Burrow M.D.   On: 04/19/2023 09:50     Subjective: Pt agreeable to going to rehab and participating, wanting to get stronger and get back home.   Discharge Exam: Vitals:   05/06/23 2021 05/07/23 0520  BP: 115/87 (!) 143/60  Pulse: 69 67  Resp: 16 18  Temp: 98.6 F (37 C) 97.9 F (36.6 C)  SpO2: 94% 95%   Vitals:   05/06/23 1415 05/06/23 1524 05/06/23 2021 05/07/23 0520  BP: 139/60 137/62 115/87 (!) 143/60  Pulse: 74 72 69 67  Resp: 18  16 18   Temp: 98 F (36.7 C) 98.4 F (36.9 C) 98.6 F (37 C) 97.9 F (36.6 C)  TempSrc: Oral Oral    SpO2: 99% 95% 94% 95%  Weight:      Height:       General exam: Appears calm and comfortable  Respiratory system: BBS clear no increased work of breathing.   Cardiovascular system: normal S1 & S2 heard. No JVD, murmurs, rubs, gallops or clicks. No pedal edema. Gastrointestinal system: Abdomen is nondistended, soft and nontender. No organomegaly or masses felt. Normal bowel sounds heard. Central nervous system: Alert and oriented. No focal neurological deficits. Extremities: Symmetric 5 x 5 power. Skin: No rashes, lesions or ulcers. Psychiatry: Judgement and insight appear normal. Mood & affect appropriate.    The results of significant diagnostics from this hospitalization (including imaging, microbiology, ancillary and laboratory) are listed  below for reference.     Microbiology: Recent Results (from the past 240 hour(s))  Resp panel by RT-PCR (RSV, Flu A&B, Covid) Anterior Nasal Swab     Status: None   Collection Time: 05/02/23  3:37 PM   Specimen: Anterior Nasal Swab  Result Value Ref Range Status   SARS Coronavirus 2 by RT PCR NEGATIVE NEGATIVE Final    Comment: (NOTE) SARS-CoV-2 target nucleic acids are NOT DETECTED.  The SARS-CoV-2 RNA is generally detectable in upper respiratory specimens during the acute phase of infection. The lowest concentration of SARS-CoV-2 viral copies this assay can detect is 138 copies/mL. A negative result does not preclude SARS-Cov-2 infection and should not be used as the sole basis for treatment or other patient management decisions. A negative result may occur with  improper specimen collection/handling, submission of specimen other than nasopharyngeal swab, presence of viral mutation(s) within the areas targeted by this assay, and inadequate number of viral copies(<138 copies/mL). A negative result must be combined with clinical observations, patient history, and epidemiological information. The expected result is Negative.  Fact Sheet for Patients:  BloggerCourse.com  Fact Sheet for Healthcare Providers:  SeriousBroker.it  This test is no t yet approved or cleared by the [place] FDA and  has been authorized for detection and/or diagnosis of SARS-CoV-2 by FDA under an Emergency Use Authorization (EUA). This EUA will remain  in effect (meaning this test can be used) for the duration of the COVID-19 declaration under Section 564(b)(1) of the Act, 21 U.S.C.section 360bbb-3(b)(1), unless the authorization is terminated  or revoked sooner.       Influenza A by PCR NEGATIVE NEGATIVE Final   Influenza B by PCR NEGATIVE NEGATIVE Final    Comment: (NOTE) The Xpert Xpress SARS-CoV-2/FLU/RSV plus assay is intended as an aid in  the diagnosis of influenza from Nasopharyngeal swab specimens and should not be used as a sole basis for treatment. Nasal washings and aspirates are unacceptable for Xpert Xpress SARS-CoV-2/FLU/RSV testing.  Fact Sheet for Patients: BloggerCourse.com  Fact Sheet for Healthcare Providers:  SeriousBroker.it  This test is not yet approved or cleared by the [place] and has been authorized for detection and/or diagnosis of SARS-CoV-2 by FDA under an Emergency Use Authorization (EUA). This EUA will remain in effect (meaning this test can be used) for the duration of the COVID-19 declaration under Section 564(b)(1) of the Act, 21 U.S.C. section 360bbb-3(b)(1), unless the authorization is terminated or revoked.     Resp Syncytial Virus by PCR NEGATIVE NEGATIVE Final    Comment: (NOTE) Fact Sheet for Patients: BloggerCourse.com  Fact Sheet for Healthcare Providers: SeriousBroker.it  This test is not yet approved or cleared by the [place] FDA and has been authorized for detection and/or diagnosis of SARS-CoV-2 by FDA under an Emergency Use Authorization (EUA). This EUA will remain in effect (meaning this test can be used) for the duration of the COVID-19 declaration under Section 564(b)(1) of the Act, 21 U.S.C. section 360bbb-3(b)(1), unless the authorization is terminated or revoked.  Performed at [facility], [address], [place], [place] [address]      Labs: BNP (last 3 results) Recent Labs    05/28/22 1721 04/24/23 1656  BNP 81.0 76.0   Basic Metabolic Panel: Recent Labs  Lab 05/02/23 1408 05/03/23 0454  NA 135 135  K 3.6 3.5  CL 100 101  CO2 24 25  GLUCOSE 100* 109*  BUN 25* 21  CREATININE 1.29* 0.96  CALCIUM 9.4 8.7*  MG  --  1.7   Liver Function Tests: Recent Labs  Lab 05/02/23 1408 05/03/23 0454  AST 19 15  ALT 13 11  ALKPHOS 74 61   BILITOT 0.7 0.5  PROT 7.1 5.8*  ALBUMIN 3.3* 2.6*   No results for input(s): "LIPASE", "AMYLASE" in the last 168 hours. No results for input(s): "AMMONIA" in the last 168 hours. CBC: Recent Labs  Lab 05/02/23 1408 05/03/23 0454  WBC 8.5 12.0*  NEUTROABS 5.5 8.8*  HGB 10.3* 8.6*  HCT 31.1* 27.0*  MCV 90.4 90.6  PLT 282 231   Cardiac Enzymes: No results for input(s): "CKTOTAL", "CKMB", "CKMBINDEX", "TROPONINI" in the last 168 hours. BNP: Invalid input(s): "POCBNP" CBG: No results for input(s): "GLUCAP" in the last 168 hours. D-Dimer No results for input(s): "DDIMER" in the last 72 hours. Hgb A1c No results for input(s): "HGBA1C" in the last 72 hours. Lipid Profile No results for input(s): "CHOL", "HDL", "LDLCALC", "TRIG", "CHOLHDL", "LDLDIRECT" in the last 72 hours. Thyroid function studies No results for input(s): "TSH", "T4TOTAL", "T3FREE", "THYROIDAB" in the last 72 hours.  Invalid input(s): "FREET3" Anemia work up No results for input(s): "VITAMINB12", "FOLATE", "FERRITIN", "TIBC", "IRON", "RETICCTPCT" in the last 72 hours. Urinalysis    Component Value Date/Time   COLORURINE YELLOW 04/24/2023 2151   APPEARANCEUR HAZY (A) 04/24/2023 2151   LABSPEC 1.025 04/24/2023 2151   PHURINE 8.0 04/24/2023 2151   GLUCOSEU NEGATIVE 04/24/2023 2151   HGBUR NEGATIVE 04/24/2023 2151   BILIRUBINUR NEGATIVE 04/24/2023 2151   KETONESUR 20 (A) 04/24/2023 2151   PROTEINUR 100 (A) 04/24/2023 2151   UROBILINOGEN 0.2 08/14/2011 1753   NITRITE NEGATIVE 04/24/2023 2151   LEUKOCYTESUR NEGATIVE 04/24/2023 2151   Sepsis Labs Recent Labs  Lab 05/02/23 1408 05/03/23 0454  WBC 8.5 12.0*   Microbiology Recent Results (from the past 240 hour(s))  Resp panel by RT-PCR (RSV, Flu A&B, Covid) Anterior Nasal Swab     Status: None   Collection Time: 05/02/23  3:37 PM   Specimen: Anterior Nasal Swab  Result Value Ref  Range Status   SARS Coronavirus 2 by RT PCR NEGATIVE NEGATIVE Final     Comment: (NOTE) SARS-CoV-2 target nucleic acids are NOT DETECTED.  The SARS-CoV-2 RNA is generally detectable in upper respiratory specimens during the acute phase of infection. The lowest concentration of SARS-CoV-2 viral copies this assay can detect is 138 copies/mL. A negative result does not preclude SARS-Cov-2 infection and should not be used as the sole basis for treatment or other patient management decisions. A negative result may occur with  improper specimen collection/handling, submission of specimen other than nasopharyngeal swab, presence of viral mutation(s) within the areas targeted by this assay, and inadequate number of viral copies(<138 copies/mL). A negative result must be combined with clinical observations, patient history, and epidemiological information. The expected result is Negative.  Fact Sheet for Patients:  BloggerCourse.com  Fact Sheet for Healthcare Providers:  SeriousBroker.it  This test is no t yet approved or cleared by the [place] FDA and  has been authorized for detection and/or diagnosis of SARS-CoV-2 by FDA under an Emergency Use Authorization (EUA). This EUA will remain  in effect (meaning this test can be used) for the duration of the COVID-19 declaration under Section 564(b)(1) of the Act, 21 U.S.C.section 360bbb-3(b)(1), unless the authorization is terminated  or revoked sooner.       Influenza A by PCR NEGATIVE NEGATIVE Final   Influenza B by PCR NEGATIVE NEGATIVE Final    Comment: (NOTE) The Xpert Xpress SARS-CoV-2/FLU/RSV plus assay is intended as an aid in the diagnosis of influenza from Nasopharyngeal swab specimens and should not be used as a sole basis for treatment. Nasal washings and aspirates are unacceptable for Xpert Xpress SARS-CoV-2/FLU/RSV testing.  Fact Sheet for Patients: BloggerCourse.com  Fact Sheet for Healthcare  Providers: SeriousBroker.it  This test is not yet approved or cleared by the [place] FDA and has been authorized for detection and/or diagnosis of SARS-CoV-2 by FDA under an Emergency Use Authorization (EUA). This EUA will remain in effect (meaning this test can be used) for the duration of the COVID-19 declaration under Section 564(b)(1) of the Act, 21 U.S.C. section 360bbb-3(b)(1), unless the authorization is terminated or revoked.     Resp Syncytial Virus by PCR NEGATIVE NEGATIVE Final    Comment: (NOTE) Fact Sheet for Patients: BloggerCourse.com  Fact Sheet for Healthcare Providers: SeriousBroker.it  This test is not yet approved or cleared by the [place] FDA and has been authorized for detection and/or diagnosis of SARS-CoV-2 by FDA under an Emergency Use Authorization (EUA). This EUA will remain in effect (meaning this test can be used) for the duration of the COVID-19 declaration under Section 564(b)(1) of the Act, 21 U.S.C. section 360bbb-3(b)(1), unless the authorization is terminated or revoked.  Performed at [facility], [address], [place], [place] [address]    Time coordinating discharge: 44 mins   SIGNED:  Standley Dakins, MD  [facility] 05/07/2023, 9:48 AM How to contact the [facility] Attending or Consulting provider 7A - 7P or covering provider during after hours 7P -7A, for this patient?  Check the care team in [facility] and look for a) attending/consulting TRH provider listed and b) the [facility] team listed Log into [email or web address] and use [place]'s universal password to access. If you do not have the password, please contact the hospital operator. Locate the [facility] provider you are looking for under [facility] and page to a number that you can be directly reached. If you still have difficulty reaching the provider,  please page the [facility] (Director on Call) for the  Hospitalists listed on amion for assistance.

## 2023-05-07 NOTE — Progress Notes (Signed)
Mobility Specialist Progress Note:    05/07/23 1130  Mobility  Activity Dangled on edge of bed  Level of Assistance Minimal assist, patient does 75% or more  Assistive Device None  Range of Motion/Exercises Active;All extremities  Activity Response Tolerated well  Mobility Referral Yes  $Mobility charge 1 Mobility  Mobility Specialist Start Time (ACUTE ONLY) 1130  Mobility Specialist Stop Time (ACUTE ONLY) 1140  Mobility Specialist Time Calculation (min) (ACUTE ONLY) 10 min   Pt received in bed, agreeable to mobility. Required MinA to dangle EOB. Pt refused standing or ambulation d/t right knee pain. Tolerated well, c/o of right knee stiffness and pain. Returned pt supine, all needs met.   Lawerance Bach Mobility Specialist Please contact via Special educational needs teacher or  Rehab office at [phone]

## 2023-05-08 NOTE — Progress Notes (Signed)
Patient seen and evaluated at bedside this morning.  Please refer to discharge summary dictated 10/1 for full details.  No other acute events or concerns noted overnight.  Discharge date will be 10/2.  Total care time: 15 minutes.

## 2023-05-08 NOTE — TOC Progression Note (Signed)
Transition of Care [facility]) - Progression Note    Patient Details  Name: Marie [name] MRN: [id number] Date of Birth: [birth year]  [facility] CM/SW Contact  Erin Sons, [place] Phone Number: 05/08/2023, 10:01 AM  Clinical Narrative:     Pt did not discharge yesterday. Pt to DC this morning to [facility]. [name] is requesting updated DC summary/progress note; MD notified. Rn informed CSW that transport has arrived for DC.   Expected Discharge Plan: Skilled Nursing Facility Barriers to Discharge: Barriers Resolved  Expected Discharge Plan and Services       Living arrangements for the past 2 months: Single Family Home Expected Discharge Date: 05/07/23                                     Social Determinants of Health (SDOH) Interventions SDOH Screenings   Food Insecurity: No Food Insecurity (04/24/2023)  Housing: Low Risk  (04/24/2023)  Transportation Needs: No Transportation Needs (04/24/2023)  Utilities: Not At Risk (04/24/2023)  Tobacco Use: Low Risk  (05/02/2023)    Readmission Risk Interventions    05/07/2023   10:24 AM 05/03/2023   11:39 AM  Readmission Risk Prevention Plan  Post Dischage Appt Complete   Medication Screening Complete   Transportation Screening Complete Complete  PCP or Specialist Appt within 5-7 Days  Not Complete  Home Care Screening  Complete  Medication Review (RN CM)  Complete

## 2023-05-21 ENCOUNTER — Ambulatory Visit: Payer: Medicare (Managed Care) | Admitting: Internal Medicine

## 2023-05-28 ENCOUNTER — Encounter: Payer: Self-pay | Admitting: Internal Medicine

## 2023-05-28 ENCOUNTER — Ambulatory Visit: Payer: Medicare (Managed Care) | Admitting: Internal Medicine

## 2023-05-28 DIAGNOSIS — K219 Gastro-esophageal reflux disease without esophagitis: Secondary | ICD-10-CM

## 2023-05-28 DIAGNOSIS — Z8719 Personal history of other diseases of the digestive system: Secondary | ICD-10-CM

## 2023-05-28 DIAGNOSIS — R109 Unspecified abdominal pain: Secondary | ICD-10-CM

## 2023-05-28 DIAGNOSIS — K5900 Constipation, unspecified: Secondary | ICD-10-CM | POA: Diagnosis not present

## 2023-05-28 DIAGNOSIS — M359 Systemic involvement of connective tissue, unspecified: Secondary | ICD-10-CM

## 2023-05-28 NOTE — Patient Instructions (Signed)
It was good to see you again today.  Continue Nexium 40 mg daily for reflux.  Resume Linzess 72 1 capsule daily to facilitate bowel function.  This is likely going to help your left-sided abdominal pain.   Hernias are not causing you any problem.  Once you finish samples of Linzess call and let us know how you did with that medication.  We will call in a prescription as indicated  Office visit here in 3 months

## 2023-05-28 NOTE — Progress Notes (Signed)
Primary Care Physician:  [facility] Primary Gastroenterologist:  Dr. Jena Gauss  Pre-Procedure History & Physical: HPI:  Marie [name] is a 82 y.o. female here for   History of collagenous colitis in remission.  No Symptoms for 6 months.  Patient recently admitted to the hospital with pneumonia.  CT September demonstrated no evidence of diverticulitis.  Small umbilical hernia and small right spigelian hernia containing a small amount of small bowel.  No incarceration.  No obstruction.  Complain about abdominal bloating.  Actually feels she has been constipated recently.  Nexium 40 mg daily continues to control her reflux symptoms very well.  If she misses a single dose she has symptoms.  Past Medical History:  Diagnosis Date   Anemia    Collagenous colitis    colonoscopy 2009   Depression    Fibromyalgia    GERD (gastroesophageal reflux disease)    Hiatal hernia 2008   small   Hypercholesterolemia    Hypertension    IBS (irritable bowel syndrome)    Lymphocytic colitis    colonoscopy 2007, TCS 2013   S/P endoscopy Sept 2008   small hh, s/p 21 French dilator    Past Surgical History:  Procedure Laterality Date   ABDOMINAL HYSTERECTOMY     BIOPSY  01/09/2017   Procedure: BIOPSY;  Surgeon: Corbin Ade, MD;  [facility]: [facility];  Service: Endoscopy;;  colon   CATARACT EXTRACTION Bilateral 2021   COLONOSCOPY  04/13/2008   Dr.Gerado Nabers- anal papilla and internal hemorrhage otherwise normal /left side diverticula   COLONOSCOPY  09/20/2011   Dr.Sham Alviar- Adequate preparation. Internal hemorrhoids; otherwise normal/ Left-sided diverticula, biopsies positive for lymphocytic colitis   COLONOSCOPY N/A 01/09/2017   Procedure: COLONOSCOPY;  Surgeon: Corbin Ade, MD;  Location: [facility];  Service: Endoscopy;  Laterality: N/A;  7:30am   ESOPHAGOGASTRODUODENOSCOPY  04/22/2007   Dr.Itsel Opfer- normal esophagus, small hiatal hernia o/w normal stomach, D1 and D2 s/p  passage of a 56-French maloney dilator   fistula of colon     FLEXIBLE SIGMOIDOSCOPY  05/29/2006   Dr.Rehman- No evidence of pseudomembranous or acute colitis.   FOOT SURGERY     KNEE SURGERY     NOSE SURGERY     VEIN SURGERY Bilateral 2021    Prior to Admission medications   Medication Sig Start Date End Date Taking? Authorizing Provider  acetaminophen (TYLENOL) 325 MG tablet Take 2 tablets (650 mg total) by mouth in the morning, at noon, and at bedtime. 05/07/23  Yes Johnson, Clanford L, MD  albuterol (PROVENTIL) (2.5 MG/3ML) 0.083% nebulizer solution Take 3 mLs (2.5 mg total) by nebulization every 4 (four) hours as needed for wheezing or shortness of breath. 05/07/23  Yes Johnson, Clanford L, MD  Ascorbic Acid (VITAMIN C PO) Take 1 tablet by mouth every morning.   Yes [provider]  CALCIUM PO Take 1 tablet by mouth every morning.   Yes [provider]  citalopram (CELEXA) 40 MG tablet Take 40 mg by mouth daily. 12/19/20  Yes [provider]  clonazePAM (KLONOPIN) 1 MG tablet Take 1 tablet (1 mg total) by mouth 2 (two) times daily as needed for anxiety. 05/07/23  Yes Johnson, Clanford L, MD  dextromethorphan (DELSYM) 30 MG/5ML liquid Take 5 mLs (30 mg total) by mouth 2 (two) times daily as needed for cough. 05/07/23  Yes Johnson, Clanford L, MD  diclofenac Sodium (VOLTAREN) 1 % GEL Apply 4 g topically 4 (four)  times daily as needed (joint and musculoskeletal pain). Apply to affected areas 05/07/23  Yes Johnson, Clanford L, MD  esomeprazole (NEXIUM) 40 MG capsule Take 40 mg by mouth daily.    Yes [provider]  hyoscyamine (LEVSIN SL) 0.125 MG SL tablet Take 0.25 mg by mouth 3 (three) times daily as needed for cramping. 04/25/23  Yes [provider]  losartan (COZAAR) 25 MG tablet Take 1 tablet (25 mg total) by mouth every morning. 05/07/23  Yes Johnson, Clanford L, MD  Multiple Vitamins-Minerals (CENTRUM SILVER ULTRA WOMENS PO) Take 1 tablet by mouth  every morning.   Yes [provider]  Omega-3 Fatty Acids (FISH OIL) 1000 MG CAPS Take 2 capsules by mouth daily.   Yes [provider]  ondansetron (ZOFRAN) 4 MG tablet Take 1 tablet (4 mg total) by mouth every 8 (eight) hours as needed for nausea or vomiting. 05/07/23  Yes Johnson, Clanford L, MD  vitamin B-12 (CYANOCOBALAMIN) 1000 MCG tablet Take 1,000 mcg by mouth daily.   Yes [provider]  oxyCODONE (OXY IR/ROXICODONE) 5 MG immediate release tablet Take 1 tablet (5 mg total) by mouth every 6 (six) hours as needed for severe pain. Patient not taking: Reported on 05/28/2023 05/07/23   Cleora Fleet, MD    Allergies as of 05/28/2023 - Review Complete 05/28/2023  Allergen Reaction Noted   Codeine Hives and Swelling 12/27/2015   Promethazine hcl  10/09/2022    Family History  Problem Relation Age of Onset   Colon cancer Maternal Uncle    Colon cancer Maternal Grandmother     Social History   Socioeconomic History   Marital status: Widowed    Spouse name: Not on file   Number of children: Not on file   Years of education: Not on file   Highest education level: Not on file  Occupational History   Not on file  Tobacco Use   Smoking status: Never   Smokeless tobacco: Never  Vaping Use   Vaping status: Never Used  Substance and Sexual Activity   Alcohol use: No   Drug use: No   Sexual activity: Not Currently  Other Topics Concern   Not on file  Social History Narrative   Not on file   Social Determinants of Health   Financial Resource Strain: Not on file  Food Insecurity: No Food Insecurity (04/24/2023)   Hunger Vital Sign    Worried About Running Out of Food in the Last Year: Never true    Ran Out of Food in the Last Year: Never true  Transportation Needs: No Transportation Needs (04/24/2023)   PRAPARE - Administrator, Civil Service (Medical): No    Lack of Transportation (Non-Medical): No  Physical Activity: Not on file   Stress: Not on file  Social Connections: Not on file  Intimate Partner Violence: Not At Risk (04/24/2023)   Humiliation, Afraid, Rape, and Kick questionnaire    Fear of Current or Ex-Partner: No    Emotionally Abused: No    Physically Abused: No    Sexually Abused: No    Review of Systems: See HPI, otherwise negative ROS  Physical Exam: BP (!) 131/55 (BP Location: Left Arm, Patient Position: Sitting, Cuff Size: Large)   Pulse 65   Temp 97.8 F (36.6 C) (Temporal)   Ht 5\' 3"  (1.6 m)   Wt 143 lb 12.8 oz (65.2 kg)   BMI 25.47 kg/m  General:   Alert,  Well-developed, well-nourished, pleasant  and cooperative in NAD ficant cervical adenopathy. Lungs:  Clear throughout to auscultation.   No wheezes, crackles, or rhonchi. No acute distress. Heart:  Regular rate and rhythm; no murmurs, clicks, rubs,  or gallops. Abdomen: Non-distended, normal bowel sounds.  Soft and nontender without appreciable mass or hepatosplenomegaly.   Impression/Plan: 82 year old lady with a history collagenous colitis in remission recently admitted to the hospital with pneumonia.  If anything in the last 6 months she has been constipated and has associated abdominal discomfort which is likely related.  Small periumbilical hernia and right spigelian hernia likely incidental findings.  Reflux well-controlled on Nexium 40 mg daily.  Recommendations:  Continue Nexium 40 mg daily for reflux.  Resume Linzess 72 1 capsule daily to facilitate bowel function.  Hopefully, better bowel function will help with her abdominal symptoms.     The presence of hernias likely not causing any symptoms.  Once samples of Linzess used, patient is to call and let us know how she did with that medication.  We will call in a prescription as indicated  Office visit here in 3 months      Notice: This dictation was prepared with Dragon dictation along with smaller phrase technology. Any transcriptional errors that result from this  process are unintentional and may not be corrected upon review.

## 2023-07-23 ENCOUNTER — Other Ambulatory Visit: Payer: Self-pay

## 2023-07-23 ENCOUNTER — Inpatient Hospital Stay (HOSPITAL_COMMUNITY)
Admission: EM | Admit: 2023-07-23 | Discharge: 2023-07-25 | DRG: 689 | Disposition: A | Discharge status: Skilled Nursing Facility | Payer: Medicare (Managed Care) | Attending: Internal Medicine | Admitting: Internal Medicine

## 2023-07-23 ENCOUNTER — Encounter (HOSPITAL_COMMUNITY): Payer: Self-pay | Admitting: *Deleted

## 2023-07-23 ENCOUNTER — Emergency Department (HOSPITAL_COMMUNITY): Payer: Medicare (Managed Care)

## 2023-07-23 DIAGNOSIS — I10 Essential (primary) hypertension: Secondary | ICD-10-CM | POA: Diagnosis present

## 2023-07-23 DIAGNOSIS — M797 Fibromyalgia: Secondary | ICD-10-CM | POA: Diagnosis present

## 2023-07-23 DIAGNOSIS — J019 Acute sinusitis, unspecified: Secondary | ICD-10-CM | POA: Diagnosis present

## 2023-07-23 DIAGNOSIS — F39 Unspecified mood [affective] disorder: Secondary | ICD-10-CM | POA: Diagnosis present

## 2023-07-23 DIAGNOSIS — N3000 Acute cystitis without hematuria: Secondary | ICD-10-CM

## 2023-07-23 DIAGNOSIS — G8929 Other chronic pain: Secondary | ICD-10-CM | POA: Diagnosis present

## 2023-07-23 DIAGNOSIS — Z90710 Acquired absence of both cervix and uterus: Secondary | ICD-10-CM

## 2023-07-23 DIAGNOSIS — N390 Urinary tract infection, site not specified: Principal | ICD-10-CM | POA: Diagnosis present

## 2023-07-23 DIAGNOSIS — Z87440 Personal history of urinary (tract) infections: Secondary | ICD-10-CM

## 2023-07-23 DIAGNOSIS — R4182 Altered mental status, unspecified: Principal | ICD-10-CM

## 2023-07-23 DIAGNOSIS — Z79899 Other long term (current) drug therapy: Secondary | ICD-10-CM

## 2023-07-23 DIAGNOSIS — G9341 Metabolic encephalopathy: Secondary | ICD-10-CM | POA: Diagnosis present

## 2023-07-23 DIAGNOSIS — E860 Dehydration: Secondary | ICD-10-CM | POA: Diagnosis present

## 2023-07-23 DIAGNOSIS — Z888 Allergy status to other drugs, medicaments and biological substances status: Secondary | ICD-10-CM

## 2023-07-23 DIAGNOSIS — Z8701 Personal history of pneumonia (recurrent): Secondary | ICD-10-CM

## 2023-07-23 DIAGNOSIS — M1990 Unspecified osteoarthritis, unspecified site: Secondary | ICD-10-CM | POA: Diagnosis present

## 2023-07-23 DIAGNOSIS — K52831 Collagenous colitis: Secondary | ICD-10-CM | POA: Diagnosis present

## 2023-07-23 DIAGNOSIS — F32A Depression, unspecified: Secondary | ICD-10-CM | POA: Diagnosis present

## 2023-07-23 DIAGNOSIS — K219 Gastro-esophageal reflux disease without esophagitis: Secondary | ICD-10-CM | POA: Diagnosis present

## 2023-07-23 DIAGNOSIS — Z885 Allergy status to narcotic agent status: Secondary | ICD-10-CM

## 2023-07-23 DIAGNOSIS — E871 Hypo-osmolality and hyponatremia: Secondary | ICD-10-CM | POA: Diagnosis present

## 2023-07-23 DIAGNOSIS — E7800 Pure hypercholesterolemia, unspecified: Secondary | ICD-10-CM | POA: Diagnosis present

## 2023-07-23 DIAGNOSIS — Z800 Family history of malignant neoplasm of digestive organs: Secondary | ICD-10-CM

## 2023-07-23 DIAGNOSIS — F419 Anxiety disorder, unspecified: Secondary | ICD-10-CM | POA: Diagnosis present

## 2023-07-23 LAB — COMPREHENSIVE METABOLIC PANEL
ALT: 15 U/L (ref 0–44)
AST: 24 U/L (ref 15–41)
Albumin: 3.7 g/dL (ref 3.5–5.0)
Alkaline Phosphatase: 62 U/L (ref 38–126)
Anion gap: 9 (ref 5–15)
BUN: 28 mg/dL — ABNORMAL HIGH (ref 8–23)
CO2: 22 mmol/L (ref 22–32)
Calcium: 9.6 mg/dL (ref 8.9–10.3)
Chloride: 99 mmol/L (ref 98–111)
Creatinine, Ser: 1.01 mg/dL — ABNORMAL HIGH (ref 0.44–1.00)
GFR, Estimated: 56 mL/min — ABNORMAL LOW (ref >60)
Glucose, Bld: 102 mg/dL — ABNORMAL HIGH (ref 70–99)
Potassium: 3.4 mmol/L — ABNORMAL LOW (ref 3.5–5.1)
Sodium: 130 mmol/L — ABNORMAL LOW (ref 135–145)
Total Bilirubin: 0.9 mg/dL (ref <1.2)
Total Protein: 7.9 g/dL (ref 6.5–8.1)

## 2023-07-23 LAB — URINALYSIS, ROUTINE W REFLEX MICROSCOPIC
Bilirubin Urine: NEGATIVE
Glucose, UA: NEGATIVE mg/dL
Hgb urine dipstick: NEGATIVE
Ketones, ur: 20 mg/dL — AB
Nitrite: NEGATIVE
Protein, ur: 100 mg/dL — AB
Specific Gravity, Urine: 1.017 (ref 1.005–1.030)
pH: 7 (ref 5.0–8.0)

## 2023-07-23 LAB — CBC WITH DIFFERENTIAL/PLATELET
Abs Immature Granulocytes: 0.02 10*3/uL (ref 0.00–0.07)
Basophils Absolute: 0.1 10*3/uL (ref 0.0–0.1)
Basophils Relative: 1 %
Eosinophils Absolute: 0.1 10*3/uL (ref 0.0–0.5)
Eosinophils Relative: 2 %
HCT: 35.6 % — ABNORMAL LOW (ref 36.0–46.0)
Hemoglobin: 12 g/dL (ref 12.0–15.0)
Immature Granulocytes: 0 %
Lymphocytes Relative: 34 %
Lymphs Abs: 2.5 10*3/uL (ref 0.7–4.0)
MCH: 29.1 pg (ref 26.0–34.0)
MCHC: 33.7 g/dL (ref 30.0–36.0)
MCV: 86.4 fL (ref 80.0–100.0)
Monocytes Absolute: 0.9 10*3/uL (ref 0.1–1.0)
Monocytes Relative: 12 %
Neutro Abs: 3.8 10*3/uL (ref 1.7–7.7)
Neutrophils Relative %: 51 %
Platelets: 249 10*3/uL (ref 150–400)
RBC: 4.12 MIL/uL (ref 3.87–5.11)
RDW: 12.3 % (ref 11.5–15.5)
WBC: 7.4 10*3/uL (ref 4.0–10.5)
nRBC: 0 % (ref 0.0–0.2)

## 2023-07-23 LAB — CBG MONITORING, ED: Glucose-Capillary: 115 mg/dL — ABNORMAL HIGH (ref 70–99)

## 2023-07-23 LAB — TROPONIN I (HIGH SENSITIVITY)
Troponin I (High Sensitivity): 7 ng/L (ref <18)
Troponin I (High Sensitivity): 8 ng/L (ref <18)

## 2023-07-23 LAB — BRAIN NATRIURETIC PEPTIDE: B Natriuretic Peptide: 169 pg/mL — ABNORMAL HIGH (ref 0.0–100.0)

## 2023-07-23 MED ORDER — SODIUM CHLORIDE 0.9 % IV SOLN
2.0000 g | Freq: Once | INTRAVENOUS | Status: AC
  Administered 2023-07-23: 2 g via INTRAVENOUS
  Filled 2023-07-23: qty 20

## 2023-07-23 MED ORDER — HYOSCYAMINE SULFATE 0.125 MG PO TABS: 0.1250 mg | ORAL_TABLET | Freq: Three times a day (TID) | ORAL | Status: DC | PRN

## 2023-07-23 MED ORDER — TRAMADOL HCL 50 MG PO TABS
50.0000 mg | ORAL_TABLET | Freq: Four times a day (QID) | ORAL | Status: DC | PRN
  Administered 2023-07-23: 50 mg via ORAL
  Filled 2023-07-23: qty 1

## 2023-07-23 MED ORDER — PANTOPRAZOLE SODIUM 40 MG PO TBEC
40.0000 mg | DELAYED_RELEASE_TABLET | Freq: Every day | ORAL | Status: DC
Start: 2023-07-24 — End: 2023-07-25
  Administered 2023-07-24 – 2023-07-25 (×2): 40 mg via ORAL
  Filled 2023-07-23 (×2): qty 1

## 2023-07-23 MED ORDER — CLONAZEPAM 0.5 MG PO TABS: 1.0000 mg | ORAL_TABLET | Freq: Two times a day (BID) | ORAL | Status: DC | PRN

## 2023-07-23 MED ORDER — ONDANSETRON HCL 4 MG/2ML IJ SOLN: 4.0000 mg | Freq: Four times a day (QID) | INTRAMUSCULAR | Status: DC | PRN

## 2023-07-23 MED ORDER — VITAMIN B-12 1000 MCG PO TABS
1000.0000 ug | ORAL_TABLET | Freq: Every day | ORAL | Status: DC
  Administered 2023-07-24 – 2023-07-25 (×2): 1000 ug via ORAL
  Filled 2023-07-23 (×2): qty 1

## 2023-07-23 MED ORDER — LACTATED RINGERS IV BOLUS
500.0000 mL | Freq: Once | INTRAVENOUS | Status: AC
  Administered 2023-07-23: 500 mL via INTRAVENOUS

## 2023-07-23 MED ORDER — ACETAMINOPHEN 500 MG PO TABS: 1000.0000 mg | ORAL_TABLET | Freq: Four times a day (QID) | ORAL | Status: DC | PRN

## 2023-07-23 MED ORDER — ENOXAPARIN SODIUM 40 MG/0.4ML IJ SOSY
40.0000 mg | PREFILLED_SYRINGE | INTRAMUSCULAR | Status: DC
  Administered 2023-07-23 – 2023-07-24 (×2): 40 mg via SUBCUTANEOUS
  Filled 2023-07-23 (×2): qty 0.4

## 2023-07-23 MED ORDER — HYDROXYZINE HCL 25 MG PO TABS: 25.0000 mg | ORAL_TABLET | Freq: Three times a day (TID) | ORAL | Status: DC | PRN

## 2023-07-23 MED ORDER — SODIUM CHLORIDE 0.9 % IV SOLN
1.0000 g | INTRAVENOUS | Status: DC
  Administered 2023-07-24: 1 g via INTRAVENOUS
  Filled 2023-07-23: qty 10

## 2023-07-23 MED ORDER — MELATONIN 3 MG PO TABS
6.0000 mg | ORAL_TABLET | Freq: Every day | ORAL | Status: DC
  Administered 2023-07-23 – 2023-07-24 (×2): 6 mg via ORAL
  Filled 2023-07-23 (×2): qty 2

## 2023-07-23 MED ORDER — LOSARTAN POTASSIUM 50 MG PO TABS
50.0000 mg | ORAL_TABLET | Freq: Every day | ORAL | Status: DC
Start: 2023-07-24 — End: 2023-07-25
  Administered 2023-07-24 – 2023-07-25 (×2): 50 mg via ORAL
  Filled 2023-07-23 (×2): qty 1

## 2023-07-23 MED ORDER — LORAZEPAM 2 MG/ML IJ SOLN
0.5000 mg | Freq: Once | INTRAMUSCULAR | Status: AC
  Administered 2023-07-23: 0.5 mg via INTRAVENOUS
  Filled 2023-07-23: qty 1

## 2023-07-23 MED ORDER — LACTATED RINGERS IV SOLN
INTRAVENOUS | Status: AC
Start: 2023-07-23 — End: 2023-07-24

## 2023-07-23 NOTE — H&P (Signed)
History and Physical    Marie [name] XBJ:[id number] DOB: Apr 15, 1941 DOA: 07/23/2023  PCP: [facility]   Patient coming from: Home   Chief Complaint:  Chief Complaint  Patient presents with   Shortness of Breath    HPI:  Marie [name] is a 82 y.o. female with hx of hypertension, hyperlipidemia, mood disorder, arthritis, chronic pain, history collagenous colitis, IBS, recurrent UTI, who was brought in by her son due to weakness and altered mental status.  Patient is unable to provide significant history.  Provided by her son at the bedside.  He reports that over the past few weeks she has been dealing with sinus issues recently diagnosed with sinusitis and prescribed Augmentin beginning 12/10.  Over the past 3 days she is been generally weak, no focal weakness.  Having diffuse pain.  Seems to have more shortness of breath, sneezing, rhinorrhea recently as well.  Today son felt she was "slower "referring to mental status and decided to bring her in.  She has a history of UTIs and some thought symptoms of her mental status were similar to UTIs she has had in the past.     Review of Systems:  No fever although has had chills.  No chest pain.  Has chronic cough since COVID months ago without change.  Decreased p.o. intake recently.  No abdominal pain, nausea, vomiting, diarrhea, urinary changes, rashes, headache, focal numbness weakness, vision changes, speech changes. ROS complete and negative except as marked above   Allergies  Allergen Reactions   Codeine Hives and Swelling   Promethazine Hcl Swelling    Prior to Admission medications   Medication Sig Start Date End Date Taking? Authorizing Provider  amoxicillin-clavulanate (AUGMENTIN) 875-125 MG tablet Take 1 tablet by mouth 2 (two) times daily. 07/16/23 07/30/23 Yes [provider]  Ascorbic Acid (VITAMIN C PO) Take 1 tablet by mouth every morning.   Yes [provider]  CALCIUM PO Take 1  tablet by mouth every morning.   Yes [provider]  clonazePAM (KLONOPIN) 1 MG tablet Take 1 tablet (1 mg total) by mouth 2 (two) times daily as needed for anxiety. 05/07/23  Yes Johnson, Clanford L, MD  Dextromethorphan-guaiFENesin (DM-GUAIFENESIN ER PO) Take 20 mLs by mouth 2 (two) times daily.   Yes [provider]  diclofenac Sodium (VOLTAREN) 1 % GEL Apply 4 g topically 4 (four) times daily as needed (joint and musculoskeletal pain). Apply to affected areas 05/07/23  Yes Johnson, Clanford L, MD  esomeprazole (NEXIUM) 40 MG capsule Take 40 mg by mouth daily.    Yes [provider]  hydrochlorothiazide (HYDRODIURIL) 12.5 MG tablet Take 12.5 mg by mouth daily. 07/01/23  Yes [provider]  ibuprofen (ADVIL) 200 MG tablet Take 200 mg by mouth every 6 (six) hours as needed for mild pain (pain score 1-3).   Yes [provider]  losartan (COZAAR) 50 MG tablet Take 50 mg by mouth daily.   Yes [provider]  Multiple Vitamins-Minerals (CENTRUM SILVER ULTRA WOMENS PO) Take 1 tablet by mouth every morning.   Yes [provider]  Omega-3 Fatty Acids (FISH OIL) 1000 MG CAPS Take 2 capsules by mouth daily.   Yes [provider]  traMADol (ULTRAM) 50 MG tablet Take 50 mg by mouth every 6 (six) hours as needed for moderate pain (pain score 4-6).   Yes [provider]  vitamin B-12 (CYANOCOBALAMIN) 1000 MCG tablet Take 1,000 mcg by mouth  daily.   Yes [provider]  hyoscyamine (LEVSIN) 0.125 MG tablet Take 0.125 mg by mouth every 8 (eight) hours as needed for cramping. 05/22/23   [provider]    Past Medical History:  Diagnosis Date   Anemia    Collagenous colitis    colonoscopy 2009   Depression    Fibromyalgia    GERD (gastroesophageal reflux disease)    Hiatal hernia 2008   small   Hypercholesterolemia    Hypertension    IBS (irritable bowel syndrome)    Lymphocytic colitis    colonoscopy  2007, TCS 2013   S/P endoscopy Sept 2008   small hh, s/p 102 French dilator    Past Surgical History:  Procedure Laterality Date   ABDOMINAL HYSTERECTOMY     BIOPSY  01/09/2017   Procedure: BIOPSY;  Surgeon: Corbin Ade, MD;  Location: [facility];  Service: Endoscopy;;  colon   CATARACT EXTRACTION Bilateral 2021   COLONOSCOPY  04/13/2008   Dr.Rourk- anal papilla and internal hemorrhage otherwise normal /left side diverticula   COLONOSCOPY  09/20/2011   Dr.Rourk- Adequate preparation. Internal hemorrhoids; otherwise normal/ Left-sided diverticula, biopsies positive for lymphocytic colitis   COLONOSCOPY N/A 01/09/2017   Procedure: COLONOSCOPY;  Surgeon: Corbin Ade, MD;  Location: [facility];  Service: Endoscopy;  Laterality: N/A;  7:30am   ESOPHAGOGASTRODUODENOSCOPY  04/22/2007   Dr.Rourk- normal esophagus, small hiatal hernia o/w normal stomach, D1 and D2 s/p passage of a 56-French maloney dilator   fistula of colon     FLEXIBLE SIGMOIDOSCOPY  05/29/2006   Dr.Rehman- No evidence of pseudomembranous or acute colitis.   FOOT SURGERY     KNEE SURGERY     NOSE SURGERY     VEIN SURGERY Bilateral 2021     reports that she has never smoked. She has never used smokeless tobacco. She reports that she does not drink alcohol and does not use drugs.  Family History  Problem Relation Age of Onset   Colon cancer Maternal Uncle    Colon cancer Maternal Grandmother      Physical Exam: Vitals:   07/23/23 1716 07/23/23 1730 07/23/23 1745 07/23/23 1817  BP: (!) 163/68  (!) 146/81   Pulse: 71  67   Resp: 19 19    Temp:    97.8 F (36.6 C)  TempSrc:      SpO2: 99%  98%   Weight:      Height:        Gen: Awake, alert, NAD, chronically ill-appearing CV: Regular, normal S1, S2, no murmurs  Resp: Normal WOB, tachypneic, rales in the bases otherwise clear Abd: Flat, normoactive, suprapubic tenderness MSK: Symmetric, trace edema.  Deformity of arthritis involving toes  Skin: No  rashes or lesions to exposed skin  Neuro: Alert and interactive, oriented to self only.  CN II through XII are intact, motor is 5 out of 5 and symmetric, sensation intact and equal to fine touch.  No clonus at the ankles. Psych: Anxious appearing, picking at lines   Data review:   Labs reviewed, notable for:   NA 130 K3.4 Creatinine 1, baseline 0.9 WBC 7 UA positive ketone, rare bacteria, pyuria positive leukocytes no nitrates  Micro:  Results for orders placed or performed during the hospital encounter of 05/02/23  Resp panel by RT-PCR (RSV, Flu A&B, Covid) Anterior Nasal Swab     Status: None   Collection Time: 05/02/23  3:37 PM   Specimen: Anterior Nasal Swab  Result Value Ref Range Status   SARS Coronavirus 2 by RT PCR NEGATIVE NEGATIVE Final    Comment: (NOTE) SARS-CoV-2 target nucleic acids are NOT DETECTED.  The SARS-CoV-2 RNA is generally detectable in upper respiratory specimens during the acute phase of infection. The lowest concentration of SARS-CoV-2 viral copies this assay can detect is 138 copies/mL. A negative result does not preclude SARS-Cov-2 infection and should not be used as the sole basis for treatment or other patient management decisions. A negative result may occur with  improper specimen collection/handling, submission of specimen other than nasopharyngeal swab, presence of viral mutation(s) within the areas targeted by this assay, and inadequate number of viral copies(<138 copies/mL). A negative result must be combined with clinical observations, patient history, and epidemiological information. The expected result is Negative.  Fact Sheet for Patients:  BloggerCourse.com  Fact Sheet for Healthcare Providers:  SeriousBroker.it  This test is no t yet approved or cleared by the [place] FDA and  has been authorized for detection and/or diagnosis of SARS-CoV-2 by FDA under an Emergency Use  Authorization (EUA). This EUA will remain  in effect (meaning this test can be used) for the duration of the COVID-19 declaration under Section 564(b)(1) of the Act, 21 U.S.C.section 360bbb-3(b)(1), unless the authorization is terminated  or revoked sooner.       Influenza A by PCR NEGATIVE NEGATIVE Final   Influenza B by PCR NEGATIVE NEGATIVE Final    Comment: (NOTE) The Xpert Xpress SARS-CoV-2/FLU/RSV plus assay is intended as an aid in the diagnosis of influenza from Nasopharyngeal swab specimens and should not be used as a sole basis for treatment. Nasal washings and aspirates are unacceptable for Xpert Xpress SARS-CoV-2/FLU/RSV testing.  Fact Sheet for Patients: BloggerCourse.com  Fact Sheet for Healthcare Providers: SeriousBroker.it  This test is not yet approved or cleared by the [place] FDA and has been authorized for detection and/or diagnosis of SARS-CoV-2 by FDA under an Emergency Use Authorization (EUA). This EUA will remain in effect (meaning this test can be used) for the duration of the COVID-19 declaration under Section 564(b)(1) of the Act, 21 U.S.C. section 360bbb-3(b)(1), unless the authorization is terminated or revoked.     Resp Syncytial Virus by PCR NEGATIVE NEGATIVE Final    Comment: (NOTE) Fact Sheet for Patients: BloggerCourse.com  Fact Sheet for Healthcare Providers: SeriousBroker.it  This test is not yet approved or cleared by the [place] FDA and has been authorized for detection and/or diagnosis of SARS-CoV-2 by FDA under an Emergency Use Authorization (EUA). This EUA will remain in effect (meaning this test can be used) for the duration of the COVID-19 declaration under Section 564(b)(1) of the Act, 21 U.S.C. section 360bbb-3(b)(1), unless the authorization is terminated or revoked.  Performed at [facility], [address],  [place], [place] [address]     Imaging reviewed:  CT Head Wo Contrast Result Date: 07/23/2023 CLINICAL DATA:  Shortness of breath chills EXAM: CT HEAD WITHOUT CONTRAST TECHNIQUE: Contiguous axial images were obtained from the base of the skull through the vertex without intravenous contrast. RADIATION DOSE REDUCTION: This exam was performed according to the departmental dose-optimization program which includes automated exposure control, adjustment of the mA and/or kV according to patient size and/or use of iterative reconstruction technique. COMPARISON:  MRI 04/24/2023 trauma CT brain 04/24/2023 FINDINGS: Brain: No acute territorial infarction, hemorrhage or intracranial mass. Atrophy and moderate chronic small vessel ischemic changes of the white matter. Nonenlarged ventricles Vascular: No hyperdense vessels.  Carotid vascular calcification Skull: Normal. Negative for fracture or focal lesion. Sinuses/Orbits: No acute finding. Other: None IMPRESSION: 1. No CT evidence for acute intracranial abnormality. 2. Atrophy and chronic small vessel ischemic changes of the white matter. Electronically Signed   By: Jasmine Pang M.D.   On: 07/23/2023 18:40   DG Chest 2 View Result Date: 07/23/2023 CLINICAL DATA:  Shortness of breath and chills EXAM: CHEST - 2 VIEW COMPARISON:  05/02/2023 FINDINGS: Cardiomediastinal silhouette and pulmonary vasculature are within normal limits. Lungs are clear. IMPRESSION: No acute cardiopulmonary process. Electronically Signed   By: Acquanetta Belling M.D.   On: 07/23/2023 16:28     ED Course:  Treated with ceftriaxone, Ativan   Assessment/Plan:  82 y.o. female with hx hypertension, hyperlipidemia, mood disorder, arthritis, chronic pain, history collagenous colitis, IBS, recurrent UTI, who was brought in by her son due to weakness and altered mental status in setting of recent URI/sinusitis being treated with Augmentin PTA.  Suspect worsening mental status related to UTI.    Encephalopathy On admission A&O x 1 only, no diagnosed history of chronic neurocognitive disease.  Reportedly from son has encephalopathy associated with UTI in the past.  Has recent history evaluation for encephalopathy and episode of shaking/facial droop 9/'24 with negative MRI, EEG, B12 normal, TSH within normal limit.  Today UA appears grossly consistent with infection, suprapubic tenderness.  Last micro with E. coli resistant to ampicillin, suspect not covered with Augmentin.  Otherwise with lab findings suggestive of dehydration, poor p.o. intake with positive ketones in urine, mild hyponatremia.  CT head with no acute findings, atrophy, chronic small vessel changes, carotid atherosclerosis visualized.  Suspect encephalopathy combination of UTI dehydration. - Management of UTI, dehydration per below - Delirium, fall precautions  Urinary tract infection History of recurrent UTI, last micro with E. coli resistant to ampicillin, suspect not covered with Augmentin.  -Ceftriaxone 1 g IV every 24 hours - Added on urine culture  Generalized weakness -PT evaluation  Dehydration Suspect fasting ketosis -Give LR 500 cc, then continue LR 50 cc/h x 10 hours then reevaluate need for continued IV fluid.  Encourage oral hydration - Regular diet -Hold her home hydrochlorothiazide for now  Acute sinusitis/URI -Change antibiotic to ceftriaxone while inpatient, can resume Augmentin to complete course at time of discharge (started 12/10)  Severe asymptomatic hypertension - Continue her home losartan -Hold her home hydrochlorothiazide in setting of dehydration -If remains with severe range hypertension can add labetalol as needed  Chronic medical problems: Hyperlipidemia: Not on statin Arthritis/chronic pain: Continue her home tramadol prn if remains with altered mental status consider hold Mood disorder: Continue home citalopram, clonazepam twice daily as needed History collagenous colitis, IBS:  Continue home Levsin GERD: Sub home PPI    Body mass index is 24.37 kg/m.    DVT prophylaxis:  Lovenox Code Status:  Full Code Diet:  Diet Orders (From admission, onward)     Start     Ordered   07/23/23 2019  Diet regular Room service appropriate? Yes; Fluid consistency: Thin  Diet effective now       Question Answer Comment  Room service appropriate? Yes   Fluid consistency: Thin      07/23/23 2022           Family Communication:  Yes discussed with son at the bedside Consults: None Admission status:   Observation, Med-Surg  Severity of Illness: The appropriate patient status for this patient is OBSERVATION. Observation status is judged to be  reasonable and necessary in order to provide the required intensity of service to ensure the patient's safety. The patient's presenting symptoms, physical exam findings, and initial radiographic and laboratory data in the context of their medical condition is felt to place them at decreased risk for further clinical deterioration. Furthermore, it is anticipated that the patient will be medically stable for discharge from the hospital within 2 midnights of admission.    Dolly Rias, MD [facility]  How to contact the [facility] Attending or Consulting provider 7A - 7P or covering provider during after hours 7P -7A, for this patient.  Check the care team in [facility] and look for a) attending/consulting [facility] provider listed and b) the [facility] team listed Log into [email or web address] and use [place]'s universal password to access. If you do not have the password, please contact the hospital operator. Locate the [facility] provider you are looking for under [facility] and page to a number that you can be directly reached. If you still have difficulty reaching the provider, please page the [facility] (Director on Call) for the Hospitalists listed on amion for assistance.  07/23/2023, 8:25 PM

## 2023-07-23 NOTE — ED Triage Notes (Signed)
Pt states she was without heat x 4 days and states she has been having sob and chills for the last few days

## 2023-07-23 NOTE — ED Notes (Signed)
ED TO INPATIENT HANDOFF REPORT  ED Nurse Name and Phone #: Ephriam Knuckles Medic [phone]  S Name/Age/Gender Burt Ek 82 y.o. female Room/Bed: APA19/APA19  Code Status   Code Status: Prior  Home/SNF/Other Home Patient oriented to: self, place, time, and situation Is this baseline? Yes   Triage Complete: Triage complete  Chief Complaint Urinary tract infection [N39.0]  Triage Note Pt states she was without heat x 4 days and states she has been having sob and chills for the last few days   Allergies Allergies  Allergen Reactions   Codeine Hives and Swelling   Promethazine Hcl Swelling    Level of Care/Admitting Diagnosis ED Disposition     ED Disposition  Admit   Condition  --   Comment  Hospital Area: [facility] [[id number]]  Level of Care: Med-Surg [16]  Covid Evaluation: Asymptomatic - no recent exposure (last 10 days) testing not required  Diagnosis: Urinary tract infection [235573]  Admitting Physician: Dolly Rias [[id number]]  Attending Physician: Dolly Rias [[id number]]          B Medical/Surgery History Past Medical History:  Diagnosis Date   Anemia    Collagenous colitis    colonoscopy 2009   Depression    Fibromyalgia    GERD (gastroesophageal reflux disease)    Hiatal hernia 2008   small   Hypercholesterolemia    Hypertension    IBS (irritable bowel syndrome)    Lymphocytic colitis    colonoscopy 2007, TCS 2013   S/P endoscopy Sept 2008   small hh, s/p 55 French dilator   Past Surgical History:  Procedure Laterality Date   ABDOMINAL HYSTERECTOMY     BIOPSY  01/09/2017   Procedure: BIOPSY;  Surgeon: Corbin Ade, MD;  [facility]: [facility];  Service: Endoscopy;;  colon   CATARACT EXTRACTION Bilateral 2021   COLONOSCOPY  04/13/2008   Dr.Rourk- anal papilla and internal hemorrhage otherwise normal /left side diverticula   COLONOSCOPY  09/20/2011   Dr.Rourk- Adequate preparation. Internal hemorrhoids; otherwise normal/  Left-sided diverticula, biopsies positive for lymphocytic colitis   COLONOSCOPY N/A 01/09/2017   Procedure: COLONOSCOPY;  Surgeon: Corbin Ade, MD;  Location: [facility];  Service: Endoscopy;  Laterality: N/A;  7:30am   ESOPHAGOGASTRODUODENOSCOPY  04/22/2007   Dr.Rourk- normal esophagus, small hiatal hernia o/w normal stomach, D1 and D2 s/p passage of a 56-French maloney dilator   fistula of colon     FLEXIBLE SIGMOIDOSCOPY  05/29/2006   Dr.Rehman- No evidence of pseudomembranous or acute colitis.   FOOT SURGERY     KNEE SURGERY     NOSE SURGERY     VEIN SURGERY Bilateral 2021     A IV Location/Drains/Wounds Patient Lines/Drains/Airways Status     Active Line/Drains/Airways     Name Placement date Placement time Site Days   Peripheral IV 07/23/23 18 G 1.16" Anterior;Proximal;Right Forearm 07/23/23  1724  Forearm  less than 1   External Urinary Catheter 07/23/23  1841  --  less than 1            Intake/Output Last 24 hours  Intake/Output Summary (Last 24 hours) at 07/23/2023 2016 Last data filed at 07/23/2023 2011 Gross per 24 hour  Intake 100 ml  Output --  Net 100 ml    Labs/Imaging Results for orders placed or performed during the hospital encounter of 07/23/23 (from the past 48 hours)  Comprehensive metabolic panel     Status: Abnormal   Collection Time: 07/23/23  5:00  PM  Result Value Ref Range   Sodium 130 (L) 135 - 145 mmol/L   Potassium 3.4 (L) 3.5 - 5.1 mmol/L   Chloride 99 98 - 111 mmol/L   CO2 22 22 - 32 mmol/L   Glucose, Bld 102 (H) 70 - 99 mg/dL    Comment: Glucose reference range applies only to samples taken after fasting for at least 8 hours.   BUN 28 (H) 8 - 23 mg/dL   Creatinine, Ser 8.65 (H) 0.44 - 1.00 mg/dL   Calcium 9.6 8.9 - 78.4 mg/dL   Total Protein 7.9 6.5 - 8.1 g/dL   Albumin 3.7 3.5 - 5.0 g/dL   AST 24 15 - 41 U/L   ALT 15 0 - 44 U/L   Alkaline Phosphatase 62 38 - 126 U/L   Total Bilirubin 0.9 <1.2 mg/dL   GFR, Estimated 56  (L) >60 mL/min    Comment: (NOTE) Calculated using the CKD-EPI Creatinine Equation (2021)    Anion gap 9 5 - 15    Comment: Performed at [facility], [address] [place], [place] [address]  CBC with Differential     Status: Abnormal   Collection Time: 07/23/23  5:00 PM  Result Value Ref Range   WBC 7.4 4.0 - 10.5 K/uL   RBC 4.12 3.87 - 5.11 MIL/uL   Hemoglobin 12.0 12.0 - 15.0 g/dL   HCT 52.8 (L) 41.3 - 24.4 %   MCV 86.4 80.0 - 100.0 fL   MCH 29.1 26.0 - 34.0 pg   MCHC 33.7 30.0 - 36.0 g/dL   RDW 01.0 27.2 - 53.6 %   Platelets 249 150 - 400 K/uL   nRBC 0.0 0.0 - 0.2 %   Neutrophils Relative % 51 %   Neutro Abs 3.8 1.7 - 7.7 K/uL   Lymphocytes Relative 34 %   Lymphs Abs 2.5 0.7 - 4.0 K/uL   Monocytes Relative 12 %   Monocytes Absolute 0.9 0.1 - 1.0 K/uL   Eosinophils Relative 2 %   Eosinophils Absolute 0.1 0.0 - 0.5 K/uL   Basophils Relative 1 %   Basophils Absolute 0.1 0.0 - 0.1 K/uL   Immature Granulocytes 0 %   Abs Immature Granulocytes 0.02 0.00 - 0.07 K/uL    Comment: Performed at [facility], [address] [place], [place] [address]  Troponin I (High Sensitivity)     Status: None   Collection Time: 07/23/23  5:00 PM  Result Value Ref Range   Troponin I (High Sensitivity) 7 <18 ng/L    Comment: (NOTE) Elevated high sensitivity troponin I (hsTnI) values and significant  changes across serial measurements may suggest ACS but many other  chronic and acute conditions are known to elevate hsTnI results.  Refer to the "Links" section for chest pain algorithms and additional  guidance. Performed at [facility], [address] [place], [place] [address]   Brain natriuretic peptide     Status: Abnormal   Collection Time: 07/23/23  5:00 PM  Result Value Ref Range   B Natriuretic Peptide 169.0 (H) 0.0 - 100.0 pg/mL    Comment: Performed at [facility], [address] [place], [place] [address]  CBG monitoring, ED     Status: Abnormal   Collection Time: 07/23/23   5:35 PM  Result Value Ref Range   Glucose-Capillary 115 (H) 70 - 99 mg/dL    Comment: Glucose reference range applies only to samples taken after fasting for at least 8 hours.  Urinalysis, Routine  w reflex microscopic -Urine, Clean Catch     Status: Abnormal   Collection Time: 07/23/23  6:26 PM  Result Value Ref Range   Color, Urine YELLOW YELLOW   APPearance CLEAR CLEAR   Specific Gravity, Urine 1.017 1.005 - 1.030   pH 7.0 5.0 - 8.0   Glucose, UA NEGATIVE NEGATIVE mg/dL   Hgb urine dipstick NEGATIVE NEGATIVE   Bilirubin Urine NEGATIVE NEGATIVE   Ketones, ur 20 (A) NEGATIVE mg/dL   Protein, ur 213 (A) NEGATIVE mg/dL   Nitrite NEGATIVE NEGATIVE   Leukocytes,Ua SMALL (A) NEGATIVE   RBC / HPF 0-5 0 - 5 RBC/hpf   WBC, UA 11-20 0 - 5 WBC/hpf   Bacteria, UA RARE (A) NONE SEEN   Squamous Epithelial / HPF 0-5 0 - 5 /HPF    Comment: Performed at [facility], [address], [place], [place] [address]  Troponin I (High Sensitivity)     Status: None   Collection Time: 07/23/23  6:38 PM  Result Value Ref Range   Troponin I (High Sensitivity) 8 <18 ng/L    Comment: (NOTE) Elevated high sensitivity troponin I (hsTnI) values and significant  changes across serial measurements may suggest ACS but many other  chronic and acute conditions are known to elevate hsTnI results.  Refer to the "Links" section for chest pain algorithms and additional  guidance. Performed at [facility], [address], [place], [place] [address]    CT Head Wo Contrast Result Date: 07/23/2023 CLINICAL DATA:  Shortness of breath chills EXAM: CT HEAD WITHOUT CONTRAST TECHNIQUE: Contiguous axial images were obtained from the base of the skull through the vertex without intravenous contrast. RADIATION DOSE REDUCTION: This exam was performed according to the departmental dose-optimization program which includes automated exposure control, adjustment of the mA and/or kV according to patient size and/or use of iterative  reconstruction technique. COMPARISON:  MRI 04/24/2023 trauma CT brain 04/24/2023 FINDINGS: Brain: No acute territorial infarction, hemorrhage or intracranial mass. Atrophy and moderate chronic small vessel ischemic changes of the white matter. Nonenlarged ventricles Vascular: No hyperdense vessels.  Carotid vascular calcification Skull: Normal. Negative for fracture or focal lesion. Sinuses/Orbits: No acute finding. Other: None IMPRESSION: 1. No CT evidence for acute intracranial abnormality. 2. Atrophy and chronic small vessel ischemic changes of the white matter. Electronically Signed   By: Jasmine Pang M.D.   On: 07/23/2023 18:40   DG Chest 2 View Result Date: 07/23/2023 CLINICAL DATA:  Shortness of breath and chills EXAM: CHEST - 2 VIEW COMPARISON:  05/02/2023 FINDINGS: Cardiomediastinal silhouette and pulmonary vasculature are within normal limits. Lungs are clear. IMPRESSION: No acute cardiopulmonary process. Electronically Signed   By: Acquanetta Belling M.D.   On: 07/23/2023 16:28    Pending Labs Unresulted Labs (From admission, onward)    None       Vitals/Pain Today's Vitals   07/23/23 1716 07/23/23 1730 07/23/23 1745 07/23/23 1817  BP: (!) 163/68  (!) 146/81   Pulse: 71  67   Resp: 19 19    Temp:    97.8 F (36.6 C)  TempSrc:      SpO2: 99%  98%   Weight:      Height:      PainSc:        Isolation Precautions No active isolations  Medications Medications  LORazepam (ATIVAN) injection 0.5 mg (0.5 mg Intravenous Given 07/23/23 1922)  cefTRIAXone (ROCEPHIN) 2 g in sodium chloride 0.9 % 100 mL IVPB (0 g Intravenous Stopped 07/23/23  2011)    Mobility walks with device     Focused Assessments    R Recommendations: See Admitting Provider Note  Report given to:   Additional Notes:

## 2023-07-24 ENCOUNTER — Ambulatory Visit: Payer: Medicare (Managed Care) | Admitting: Orthopedic Surgery

## 2023-07-24 DIAGNOSIS — K52831 Collagenous colitis: Secondary | ICD-10-CM | POA: Diagnosis present

## 2023-07-24 DIAGNOSIS — Z885 Allergy status to narcotic agent status: Secondary | ICD-10-CM | POA: Diagnosis not present

## 2023-07-24 DIAGNOSIS — Z800 Family history of malignant neoplasm of digestive organs: Secondary | ICD-10-CM | POA: Diagnosis not present

## 2023-07-24 DIAGNOSIS — Z87440 Personal history of urinary (tract) infections: Secondary | ICD-10-CM | POA: Diagnosis not present

## 2023-07-24 DIAGNOSIS — F32A Depression, unspecified: Secondary | ICD-10-CM | POA: Diagnosis present

## 2023-07-24 DIAGNOSIS — F39 Unspecified mood [affective] disorder: Secondary | ICD-10-CM | POA: Diagnosis present

## 2023-07-24 DIAGNOSIS — J019 Acute sinusitis, unspecified: Secondary | ICD-10-CM | POA: Diagnosis present

## 2023-07-24 DIAGNOSIS — R4182 Altered mental status, unspecified: Secondary | ICD-10-CM | POA: Diagnosis not present

## 2023-07-24 DIAGNOSIS — Z90710 Acquired absence of both cervix and uterus: Secondary | ICD-10-CM | POA: Diagnosis not present

## 2023-07-24 DIAGNOSIS — N390 Urinary tract infection, site not specified: Secondary | ICD-10-CM | POA: Diagnosis present

## 2023-07-24 DIAGNOSIS — K219 Gastro-esophageal reflux disease without esophagitis: Secondary | ICD-10-CM | POA: Diagnosis present

## 2023-07-24 DIAGNOSIS — E7800 Pure hypercholesterolemia, unspecified: Secondary | ICD-10-CM | POA: Diagnosis present

## 2023-07-24 DIAGNOSIS — G8929 Other chronic pain: Secondary | ICD-10-CM | POA: Diagnosis present

## 2023-07-24 DIAGNOSIS — M1990 Unspecified osteoarthritis, unspecified site: Secondary | ICD-10-CM | POA: Diagnosis present

## 2023-07-24 DIAGNOSIS — E871 Hypo-osmolality and hyponatremia: Secondary | ICD-10-CM | POA: Diagnosis present

## 2023-07-24 DIAGNOSIS — Z79899 Other long term (current) drug therapy: Secondary | ICD-10-CM | POA: Diagnosis not present

## 2023-07-24 DIAGNOSIS — Z888 Allergy status to other drugs, medicaments and biological substances status: Secondary | ICD-10-CM | POA: Diagnosis not present

## 2023-07-24 DIAGNOSIS — N3000 Acute cystitis without hematuria: Secondary | ICD-10-CM | POA: Diagnosis not present

## 2023-07-24 DIAGNOSIS — E860 Dehydration: Secondary | ICD-10-CM | POA: Diagnosis present

## 2023-07-24 DIAGNOSIS — F419 Anxiety disorder, unspecified: Secondary | ICD-10-CM | POA: Diagnosis present

## 2023-07-24 DIAGNOSIS — I10 Essential (primary) hypertension: Secondary | ICD-10-CM | POA: Diagnosis present

## 2023-07-24 DIAGNOSIS — G9341 Metabolic encephalopathy: Secondary | ICD-10-CM | POA: Diagnosis present

## 2023-07-24 DIAGNOSIS — Z8701 Personal history of pneumonia (recurrent): Secondary | ICD-10-CM | POA: Diagnosis not present

## 2023-07-24 DIAGNOSIS — M797 Fibromyalgia: Secondary | ICD-10-CM | POA: Diagnosis present

## 2023-07-24 LAB — RESPIRATORY PANEL BY PCR

## 2023-07-24 LAB — BASIC METABOLIC PANEL
Anion gap: 9 (ref 5–15)
BUN: 23 mg/dL (ref 8–23)
CO2: 24 mmol/L (ref 22–32)
Calcium: 8.8 mg/dL — ABNORMAL LOW (ref 8.9–10.3)
Chloride: 97 mmol/L — ABNORMAL LOW (ref 98–111)
Creatinine, Ser: 0.9 mg/dL (ref 0.44–1.00)
GFR, Estimated: >60 mL/min (ref >60)
Glucose, Bld: 100 mg/dL — ABNORMAL HIGH (ref 70–99)
Potassium: 3.5 mmol/L (ref 3.5–5.1)
Sodium: 130 mmol/L — ABNORMAL LOW (ref 135–145)

## 2023-07-24 LAB — CBC
HCT: 28.7 % — ABNORMAL LOW (ref 36.0–46.0)
Hemoglobin: 9.3 g/dL — ABNORMAL LOW (ref 12.0–15.0)
MCH: 28.7 pg (ref 26.0–34.0)
MCHC: 32.4 g/dL (ref 30.0–36.0)
MCV: 88.6 fL (ref 80.0–100.0)
Platelets: 200 10*3/uL (ref 150–400)
RBC: 3.24 MIL/uL — ABNORMAL LOW (ref 3.87–5.11)
RDW: 12.1 % (ref 11.5–15.5)
WBC: 7.2 10*3/uL (ref 4.0–10.5)
nRBC: 0 % (ref 0.0–0.2)

## 2023-07-24 LAB — PHOSPHORUS: Phosphorus: 3.5 mg/dL (ref 2.5–4.6)

## 2023-07-24 LAB — MAGNESIUM: Magnesium: 1.7 mg/dL (ref 1.7–2.4)

## 2023-07-24 MED ORDER — LORATADINE 10 MG PO TABS
10.0000 mg | ORAL_TABLET | Freq: Every day | ORAL | Status: DC
  Administered 2023-07-24 – 2023-07-25 (×2): 10 mg via ORAL
  Filled 2023-07-24 (×2): qty 1

## 2023-07-24 NOTE — Evaluation (Signed)
Physical Therapy Evaluation Patient Details Name: NYLE BLAKENSHIP MRN: [id number] DOB: [birth year] Today's Date: 07/24/2023  History of Present Illness  a 82 y.o. female with hx of hypertension, hyperlipidemia, mood disorder, arthritis, chronic pain, history collagenous colitis, IBS, recurrent UTI, who was brought in by her son due to weakness and altered mental status.  Patient is unable to provide significant history.  Provided by her son at the bedside.  He reports that over the past few weeks she has been dealing with sinus issues recently diagnosed with sinusitis and prescribed Augmentin beginning 12/10.  Over the past 3 days she is been generally weak, no focal weakness.  Having diffuse pain.  Seems to have more shortness of breath, sneezing, rhinorrhea recently as well.  Today son felt she was "slower "referring to mental status and decided to bring her in.  She has a history of UTIs and some thought symptoms of her mental status were similar to UTIs she has had in the past.  Clinical Impression   Pt tolerated today's Physical Therapy Evaluation, with slow mentation and lethargic but cooperative and able to follow single commands consistently. At baseline, is independent with AD for mobility in community and household, only needs PRN assist with ADLs and iADLs from son. At evaluation, given assist for strength, balance and AD management. Currently, pt demonstrating decreased status in functional mobility, transfers, ambulation and ALDs due to significant muscle weakness, deconditioning, and balance deficits.Based upon these deficits/impairments, patient will benefit from continued skilled physical therapy services during remainder of hospital stay and at the next recommended venue of care to address deficits and promote return to optimal function.                If plan is discharge home, recommend the following: A little help with walking and/or transfers;A lot of help with  bathing/dressing/bathroom   Can travel by private vehicle        Equipment Recommendations None recommended by PT  Recommendations for Other Services       Functional Status Assessment Patient has had a recent decline in their functional status and demonstrates the ability to make significant improvements in function in a reasonable and predictable amount of time.     Precautions / Restrictions Precautions Precautions: Fall Restrictions Weight Bearing Restrictions Per Provider Order: No      Mobility  Bed Mobility Overal bed mobility: Modified Independent             General bed mobility comments: slow, labored sitting to EOB with use of bedrail and increased time Patient Response: Cooperative  Transfers Overall transfer level: Needs assistance   Transfers: Sit to/from Stand Sit to Stand: Min assist           General transfer comment: power to stand from EOB to RW with cues for UE management, min assist to elevate into stand. Pt with increased onset of dizziness, sat back down and attempted again to move into ambulation. Poor eccentric control into sitting at recliner.    Ambulation/Gait Ambulation/Gait assistance: Min assist Gait Distance (Feet): 5 Feet Assistive device: Rolling walker (2 wheels) Gait Pattern/deviations: Step-through pattern, Decreased step length - right, Decreased step length - left, Decreased stance time - right, Decreased stance time - left, Decreased weight shift to left, Decreased dorsiflexion - left, Decreased dorsiflexion - right, Shuffle       General Gait Details: slow, labored shuffling gait pattern to recliner with cues for RW management. min assist for DME management and guidance  Stairs            Wheelchair Mobility     Tilt Bed Tilt Bed Patient Response: Cooperative  Modified Rankin (Stroke Patients Only)       Balance Overall balance assessment: Needs assistance   Sitting balance-Leahy Scale: Fair Sitting  balance - Comments: fair sitting at EOB- can be left alone but increased anterior flexion   Standing balance support: Bilateral upper extremity supported, Reliant on assistive device for balance Standing balance-Leahy Scale: Poor Standing balance comment: poor standing with RW, needing assistance to maintain balance.                             Pertinent Vitals/Pain Pain Assessment Pain Assessment: No/denies pain    Home Living Family/patient expects to be discharged to:: Private residence Living Arrangements: Children Available Help at Discharge: Family;Available PRN/intermittently Type of Home: House Home Access: Stairs to enter Entrance Stairs-Rails: None Entrance Stairs-Number of Steps: 1   Home Layout: One level Home Equipment: Agricultural consultant (2 wheels);Cane - quad;BSC/3in1;Shower seat;Grab bars - tub/shower;Grab bars - toilet      Prior Function Prior Level of Function : Independent/Modified Independent;Needs assist       Physical Assist : ADLs (physical);Mobility (physical) Mobility (physical): Bed mobility;Transfers;Gait;Stairs ADLs (physical): IADLs Mobility Comments: RW for community and household ambulation. Pt confirmed same as last admission. ADLs Comments: Independent ADL; assist IADL from family -Pt confirmed same as last admission.     Extremity/Trunk Assessment   Upper Extremity Assessment Upper Extremity Assessment: Overall WFL for tasks assessed    Lower Extremity Assessment Lower Extremity Assessment: Generalized weakness;RLE deficits/detail;LLE deficits/detail RLE Deficits / Details: knee extension MMT 4-/5 RLE Sensation: WNL LLE Deficits / Details: knee extension MMT 4-/5 LLE Sensation: WNL       Communication   Communication Communication: Difficulty following commands/understanding Following commands: Follows one step commands consistently;Follows multi-step commands inconsistently;Follows multi-step commands with increased  time Cueing Techniques: Verbal cues;Tactile cues  Cognition Arousal: Lethargic Behavior During Therapy: WFL for tasks assessed/performed Overall Cognitive Status: Within Functional Limits for tasks assessed                                 General Comments: alert & oriented x 3; unable to determine current date        General Comments      Exercises     Assessment/Plan    PT Assessment Patient needs continued PT services  PT Problem List Decreased strength;Decreased activity tolerance;Decreased balance;Decreased mobility       PT Treatment Interventions DME instruction;Gait training;Functional mobility training;Therapeutic activities;Therapeutic exercise;Balance training;Neuromuscular re-education    PT Goals (Current goals can be found in the Care Plan section)  Acute Rehab PT Goals Patient Stated Goal: return home PT Goal Formulation: With patient Time For Goal Achievement: 08/07/23 Potential to Achieve Goals: Good    Frequency Min 3X/week     Co-evaluation               AM-PAC PT "6 Clicks" Mobility  Outcome Measure Help needed turning from your back to your side while in a flat bed without using bedrails?: None Help needed moving from lying on your back to sitting on the side of a flat bed without using bedrails?: A Little Help needed moving to and from a bed to a chair (including a wheelchair)?: A Little Help needed standing up from  a chair using your arms (e.g., wheelchair or bedside chair)?: A Little Help needed to walk in hospital room?: A Lot Help needed climbing 3-5 steps with a railing? : Total 6 Click Score: 16    End of Session Equipment Utilized During Treatment: Gait belt Activity Tolerance: Patient tolerated treatment well Patient left: in chair;with call bell/phone within reach;with chair alarm set Nurse Communication: Mobility status PT Visit Diagnosis: Unsteadiness on feet (R26.81);Muscle weakness (generalized) (M62.81)     Time: 1308-6578 PT Time Calculation (min) (ACUTE ONLY): 22 min   Charges:   PT Evaluation $PT Eval Low Complexity: 1 Low   PT General Charges $$ ACUTE PT VISIT: 1 Visit         Elie Goody, DPT [facility] [place] [address] [phone] office  Nelida Meuse 07/24/2023, 9:45 AM

## 2023-07-24 NOTE — Progress Notes (Signed)
Progress Note   Patient: Marie [name] AOZ:[id number] DOB: 05/18/1941 DOA: 07/23/2023     0 DOS: the patient was seen and examined on 07/24/2023   Brief hospital admission course: As per H&P written by Dr. Lazarus Salines on 07/23/2023 Marie [name] is a 82 y.o. female with hx of hypertension, hyperlipidemia, mood disorder, arthritis, chronic pain, history collagenous colitis, IBS, recurrent UTI, who was brought in by her son due to weakness and altered mental status.  Patient is unable to provide significant history.  Provided by her son at the bedside.  He reports that over the past few weeks she has been dealing with sinus issues recently diagnosed with sinusitis and prescribed Augmentin beginning 12/10.  Over the past 3 days she is been generally weak, no focal weakness.  Having diffuse pain.  Seems to have more shortness of breath, sneezing, rhinorrhea recently as well.  Today son felt she was "slower "referring to mental status and decided to bring her in.  She has a history of UTIs and some thought symptoms of her mental status were similar to UTIs she has had in the past.    Assessment and Plan: 1-acute metabolic encephalopathy -Appears to be secondary to patient's UTI. -After fluid resuscitation and initiation of antibiotics patient was oriented x 2 on today's exam -No fever, no chest pain, no nausea or vomiting -Continue to express feeling weak and deconditioned. -Continue current antibiotic therapy and follow culture results. -Maintain adequate hydration.  2-urinary tract infection -Prior cultures demonstrating E. coli resistant to ampicillin -Continue current IV antibiotic therapy and follow sensitivity to adjust treatment as needed. -Patient reports no nausea, no vomiting, no fever.  3-physical deconditioning/weakness -Seen by physical therapy with recommendation for skilled nursing facility at discharge -[facility] has been made aware and will follow insurance clearance for  placement.  4-dehydration/hyponatremia -Patient advised to maintain adequate oral intake -Fluid resuscitation provided at time of admission -Continue holding diuretic therapy at the moment. -Continue to follow electrolytes trend.  5-hypertension -Continue treatment with losartan -Follow blood pressure trend and use as needed labetalol -Continue holding HCTZ.  6-recent acute sinusitis/URI -Continue treatment with ceftriaxone while inpatient -Loratadine has also been provided -Patient expressed feeling less congested and is not requiring oxygen supplementation.  7-gastroesophageal flux disease -Continue PPI.  8-history of collagenous colitis/IBS -Continue treatment with Levsin -Patient reports no nausea, no vomiting and no abdominal pain.  9-history of depression/anxiety -Continue citalopram and as needed clonazepam.  10-chronic pain/arthritis -Continue as needed home analgesic therapy -Continue supportive care.   Subjective:  Afebrile, no chest pain, no nausea, no vomiting.  Reports feeling better and demonstrated to be oriented x 1 examination.  Physical Exam: Vitals:   07/23/23 2304 07/24/23 0452 07/24/23 0732 07/24/23 1248  BP:  (!) 119/54 (!) 136/58 110/61  Pulse: 66 62 65 64  Resp:  16 16 16   Temp: 99 F (37.2 C) 98.2 F (36.8 C) 98.5 F (36.9 C) 97.7 F (36.5 C)  TempSrc: Oral Oral Oral Oral  SpO2: 92% 93% 93% 99%  Weight:      Height:       General exam: Alert, awake, oriented x2 and following commands appropriately; no chest pain, no nausea, no vomiting, no shortness of breath.  Chronically ill in appearance. Respiratory system: Clear to auscultation. Respiratory effort normal.  Good saturation on room air. Cardiovascular system:RRR. No rubs or gallops. Gastrointestinal system: Abdomen is nondistended, soft and nontender. No organomegaly or masses felt. Normal bowel sounds heard. Central nervous system:  No focal neurological deficits. Extremities: No  cyanosis or clubbing.  Trace edema appreciated bilaterally. Skin: No petechiae. Psychiatry: Flat affect appreciated on exam.  Data Reviewed: Basic metabolic panel: Sodium 130, potassium 3.5, chloride 97, bicarb 24, glucose 100, BUN 23, creatinine 0.90 and GFR >60 CBC: WBCs 7.2, hemoglobin 9.3 and platelet count 200 K.  Family Communication: No family at bedside.  Disposition: Status is: Inpatient Remains inpatient appropriate because: Continue IV antibiotics.   Planned Discharge Destination: Skilled nursing facility   Time spent: 50 minutes  Author: Vassie Loll, MD 07/24/2023 5:50 PM  For on call review [email or web address].

## 2023-07-24 NOTE — Plan of Care (Signed)
  Problem: Acute Rehab PT Goals(only PT should resolve) Goal: Pt Will Go Supine/Side To Sit Flowsheets (Taken 07/24/2023 0949) Pt will go Supine/Side to Sit: Independently Goal: Patient Will Perform Sitting Balance Flowsheets (Taken 07/24/2023 0949) Patient will perform sitting balance: Independently Goal: Patient Will Transfer Sit To/From Stand Flowsheets (Taken 07/24/2023 0949) Patient will transfer sit to/from stand: with modified independence Goal: Pt Will Ambulate Flowsheets (Taken 07/24/2023 0949) Pt will Ambulate:  50 feet  75 feet  with modified independence  with rolling walker  Elie Goody, DPT [facility]- [place] 336 [phone] office

## 2023-07-24 NOTE — ED Provider Notes (Signed)
[facility] MEDICAL SURGICAL UNIT Provider Note  CSN: [id number] Arrival date & time: 07/23/23 1402  Chief Complaint(s) Shortness of Breath  HPI Marie [name] is a 82 y.o. female with PMH HTN, HLD, chronic pain, IBS who presents emergency department for evaluation of altered mental status, shortness of breath and abnormal behavior.  States that over the last 3 days patient's mental status has been progressively worsening with confusion and generalized weakness.  Patient reportedly had been at home with no heat and was complaining of progressive shortness of breath and chills for the last few days.  Additional history obtained from patient's son who states that that this has happened before and there was possible concern for seizure behavior.  He is concerned the patient may be having a stroke giving some grimacing seen in the face.  Denies patient complaints of chest pain, vomiting, diarrhea or other systemic symptoms.  Additional history unable to be obtained as patient is currently altered   Past Medical History Past Medical History:  Diagnosis Date   Anemia    Collagenous colitis    colonoscopy 2009   Depression    Fibromyalgia    GERD (gastroesophageal reflux disease)    Hiatal hernia 2008   small   Hypercholesterolemia    Hypertension    IBS (irritable bowel syndrome)    Lymphocytic colitis    colonoscopy 2007, TCS 2013   S/P endoscopy Sept 2008   small hh, s/p 71 French dilator   Patient Active Problem List   Diagnosis Date Noted   Urinary tract infection 07/23/2023   Atypical pneumonia 05/03/2023   Elevated troponin 05/03/2023   Near syncope 05/03/2023   Acute respiratory failure with hypoxia (HCC) 05/02/2023   Sensory disturbance 04/25/2023   Seizure-like activity (HCC) 04/25/2023   Speech abnormality 04/24/2023   Dyspnea 04/24/2023   Chest pain 11/21/2020   Dehydration 01/04/2017   LLQ pain 12/27/2015   Weight loss 12/17/2011   Lymphocytic colitis 11/21/2011    LUQ pain 11/21/2011   Diarrhea 04/30/2011   ANEMIA, MILD 08/05/2008   Depression 08/05/2008   Essential hypertension 08/05/2008   GERD 08/05/2008   Irritable bowel syndrome 08/05/2008   Diffuse connective tissue disease (HCC) 08/05/2008   Arthropathy 08/05/2008   FIBROMYALGIA 08/05/2008   ABDOMINAL BLOATING 08/05/2008   Incontinence of feces 08/05/2008   DIARRHEA, RECURRENT 08/05/2008   Abdominal pain 08/05/2008   DIVERTICULOSIS, COLON, HX OF 08/05/2008   Home Medication(s) Prior to Admission medications   Medication Sig Start Date End Date Taking? Authorizing Provider  amoxicillin-clavulanate (AUGMENTIN) 875-125 MG tablet Take 1 tablet by mouth 2 (two) times daily. 07/16/23 07/30/23 Yes [provider]  Ascorbic Acid (VITAMIN C PO) Take 1 tablet by mouth every morning.   Yes [provider]  CALCIUM PO Take 1 tablet by mouth every morning.   Yes [provider]  clonazePAM (KLONOPIN) 1 MG tablet Take 1 tablet (1 mg total) by mouth 2 (two) times daily as needed for anxiety. 05/07/23  Yes Johnson, Clanford L, MD  Dextromethorphan-guaiFENesin (DM-GUAIFENESIN ER PO) Take 20 mLs by mouth 2 (two) times daily.   Yes [provider]  diclofenac Sodium (VOLTAREN) 1 % GEL Apply 4 g topically 4 (four) times daily as needed (joint and musculoskeletal pain). Apply to affected areas 05/07/23  Yes Johnson, Clanford L, MD  esomeprazole (NEXIUM) 40 MG capsule Take 40 mg by mouth daily.    Yes [provider]  hydrochlorothiazide (HYDRODIURIL) 12.5 MG tablet Take 12.5 mg  by mouth daily. 07/01/23  Yes [provider]  ibuprofen (ADVIL) 200 MG tablet Take 200 mg by mouth every 6 (six) hours as needed for mild pain (pain score 1-3).   Yes [provider]  losartan (COZAAR) 50 MG tablet Take 50 mg by mouth daily.   Yes [provider]  Multiple Vitamins-Minerals (CENTRUM SILVER ULTRA WOMENS PO) Take 1 tablet by mouth every morning.   Yes  [provider]  Omega-3 Fatty Acids (FISH OIL) 1000 MG CAPS Take 2 capsules by mouth daily.   Yes [provider]  traMADol (ULTRAM) 50 MG tablet Take 50 mg by mouth every 6 (six) hours as needed for moderate pain (pain score 4-6).   Yes [provider]  vitamin B-12 (CYANOCOBALAMIN) 1000 MCG tablet Take 1,000 mcg by mouth daily.   Yes [provider]  hyoscyamine (LEVSIN) 0.125 MG tablet Take 0.125 mg by mouth every 8 (eight) hours as needed for cramping. 05/22/23   [provider]                                                                                                                                    Past Surgical History Past Surgical History:  Procedure Laterality Date   ABDOMINAL HYSTERECTOMY     BIOPSY  01/09/2017   Procedure: BIOPSY;  Surgeon: Corbin Ade, MD;  Location: [facility];  Service: Endoscopy;;  colon   CATARACT EXTRACTION Bilateral 2021   COLONOSCOPY  04/13/2008   Dr.Rourk- anal papilla and internal hemorrhage otherwise normal /left side diverticula   COLONOSCOPY  09/20/2011   Dr.Rourk- Adequate preparation. Internal hemorrhoids; otherwise normal/ Left-sided diverticula, biopsies positive for lymphocytic colitis   COLONOSCOPY N/A 01/09/2017   Procedure: COLONOSCOPY;  Surgeon: Corbin Ade, MD;  Location: [facility];  Service: Endoscopy;  Laterality: N/A;  7:30am   ESOPHAGOGASTRODUODENOSCOPY  04/22/2007   Dr.Rourk- normal esophagus, small hiatal hernia o/w normal stomach, D1 and D2 s/p passage of a 56-French maloney dilator   fistula of colon     FLEXIBLE SIGMOIDOSCOPY  05/29/2006   Dr.Rehman- No evidence of pseudomembranous or acute colitis.   FOOT SURGERY     KNEE SURGERY     NOSE SURGERY     VEIN SURGERY Bilateral 2021   Family History Family History  Problem Relation Age of Onset   Colon cancer Maternal Uncle    Colon cancer Maternal Grandmother     Social History Social History   Tobacco Use    Smoking status: Never   Smokeless tobacco: Never  Vaping Use   Vaping status: Never Used  Substance Use Topics   Alcohol use: No   Drug use: No   Allergies Codeine and Promethazine hcl  Review of Systems Review of Systems  Unable to perform ROS: Mental status change    Physical Exam Vital Signs  I have reviewed the triage vital signs BP Marland Kitchen)  136/58 (BP Location: Left Arm)   Pulse 65   Temp 98.5 F (36.9 C) (Oral)   Resp 16   Ht 5\' 4"  (1.626 m)   Wt 62.2 kg   SpO2 93%   BMI 23.54 kg/m   Physical Exam Vitals and nursing note reviewed.  Constitutional:      General: She is not in acute distress.    Appearance: She is well-developed. She is ill-appearing.  HENT:     Head: Normocephalic and atraumatic.  Eyes:     Conjunctiva/sclera: Conjunctivae normal.  Cardiovascular:     Rate and Rhythm: Normal rate and regular rhythm.     Heart sounds: No murmur heard. Pulmonary:     Effort: Pulmonary effort is normal. No respiratory distress.     Breath sounds: Normal breath sounds.  Abdominal:     Palpations: Abdomen is soft.     Tenderness: There is no abdominal tenderness.  Musculoskeletal:        General: No swelling.     Cervical back: Neck supple.  Skin:    General: Skin is warm and dry.     Capillary Refill: Capillary refill takes less than 2 seconds.  Neurological:     Mental Status: She is alert. She is disoriented.     Motor: Weakness (Generalized) present.  Psychiatric:        Mood and Affect: Mood normal.     ED Results and Treatments Labs (all labs ordered are listed, but only abnormal results are displayed) Labs Reviewed  COMPREHENSIVE METABOLIC PANEL - Abnormal; Notable for the following components:      Result Value   Sodium 130 (*)    Potassium 3.4 (*)    Glucose, Bld 102 (*)    BUN 28 (*)    Creatinine, Ser 1.01 (*)    GFR, Estimated 56 (*)    All other components within normal limits  CBC WITH DIFFERENTIAL/PLATELET - Abnormal; Notable for  the following components:   HCT 35.6 (*)    All other components within normal limits  BRAIN NATRIURETIC PEPTIDE - Abnormal; Notable for the following components:   B Natriuretic Peptide 169.0 (*)    All other components within normal limits  URINALYSIS, ROUTINE W REFLEX MICROSCOPIC - Abnormal; Notable for the following components:   Ketones, ur 20 (*)    Protein, ur 100 (*)    Leukocytes,Ua SMALL (*)    Bacteria, UA RARE (*)    All other components within normal limits  BASIC METABOLIC PANEL - Abnormal; Notable for the following components:   Sodium 130 (*)    Chloride 97 (*)    Glucose, Bld 100 (*)    Calcium 8.8 (*)    All other components within normal limits  CBC - Abnormal; Notable for the following components:   RBC 3.24 (*)    Hemoglobin 9.3 (*)    HCT 28.7 (*)    All other components within normal limits  CBG MONITORING, ED - Abnormal; Notable for the following components:   Glucose-Capillary 115 (*)    All other components within normal limits  RESPIRATORY PANEL BY PCR  URINE CULTURE  PHOSPHORUS  MAGNESIUM  TROPONIN I (HIGH SENSITIVITY)  TROPONIN I (HIGH SENSITIVITY)  Radiology CT Head Wo Contrast Result Date: 07/23/2023 CLINICAL DATA:  Shortness of breath chills EXAM: CT HEAD WITHOUT CONTRAST TECHNIQUE: Contiguous axial images were obtained from the base of the skull through the vertex without intravenous contrast. RADIATION DOSE REDUCTION: This exam was performed according to the departmental dose-optimization program which includes automated exposure control, adjustment of the mA and/or kV according to patient size and/or use of iterative reconstruction technique. COMPARISON:  MRI 04/24/2023 trauma CT brain 04/24/2023 FINDINGS: Brain: No acute territorial infarction, hemorrhage or intracranial mass. Atrophy and moderate chronic small vessel ischemic  changes of the white matter. Nonenlarged ventricles Vascular: No hyperdense vessels.  Carotid vascular calcification Skull: Normal. Negative for fracture or focal lesion. Sinuses/Orbits: No acute finding. Other: None IMPRESSION: 1. No CT evidence for acute intracranial abnormality. 2. Atrophy and chronic small vessel ischemic changes of the white matter. Electronically Signed   By: Jasmine Pang M.D.   On: 07/23/2023 18:40   DG Chest 2 View Result Date: 07/23/2023 CLINICAL DATA:  Shortness of breath and chills EXAM: CHEST - 2 VIEW COMPARISON:  05/02/2023 FINDINGS: Cardiomediastinal silhouette and pulmonary vasculature are within normal limits. Lungs are clear. IMPRESSION: No acute cardiopulmonary process. Electronically Signed   By: Acquanetta Belling M.D.   On: 07/23/2023 16:28    Pertinent labs & imaging results that were available during my care of the patient were reviewed by me and considered in my medical decision making (see MDM for details).  Medications Ordered in ED Medications  enoxaparin (LOVENOX) injection 40 mg (40 mg Subcutaneous Given 07/23/23 2252)  lactated ringers infusion (0 mLs Intravenous Stopped 07/24/23 0545)  acetaminophen (TYLENOL) tablet 1,000 mg (has no administration in time range)  ondansetron (ZOFRAN) injection 4 mg (has no administration in time range)  melatonin tablet 6 mg (6 mg Oral Given 07/23/23 2250)  cefTRIAXone (ROCEPHIN) 1 g in sodium chloride 0.9 % 100 mL IVPB (has no administration in time range)  traMADol (ULTRAM) tablet 50 mg (50 mg Oral Given 07/23/23 2252)  losartan (COZAAR) tablet 50 mg (50 mg Oral Given 07/24/23 0855)  pantoprazole (PROTONIX) EC tablet 40 mg (40 mg Oral Given 07/24/23 0856)  hyoscyamine (LEVSIN) tablet 0.125 mg (has no administration in time range)  cyanocobalamin (VITAMIN B12) tablet 1,000 mcg (1,000 mcg Oral Given 07/24/23 0856)  clonazePAM (KLONOPIN) tablet 1 mg (has no administration in time range)  loratadine (CLARITIN) tablet 10  mg (has no administration in time range)  LORazepam (ATIVAN) injection 0.5 mg (0.5 mg Intravenous Given 07/23/23 1922)  cefTRIAXone (ROCEPHIN) 2 g in sodium chloride 0.9 % 100 mL IVPB (0 g Intravenous Stopped 07/23/23 2011)  lactated ringers bolus 500 mL (500 mLs Intravenous New Bag/Given 07/23/23 2059)                                                                                                                                     Procedures Procedures  (including  critical care time)  Medical Decision Making / ED Course   This patient presents to the ED for concern of generalized weakness, altered mental status, this involves an extensive number of treatment options, and is a complaint that carries with it a high risk of complications and morbidity.  The differential diagnosis includes infection, metabolic/toxic encephalopathy, hypoglycemia, malperfusion, hypoxia, trauma or other intracranial process  MDM: Patient seen emergency room for evaluation of altered mental status and generalized weakness.  Physical exam with bilateral upper extremity weakness, mild tachypnea but is otherwise unremarkable.  Laboratory evaluation with potassium of 3.4, BUN 28, creatinine 1.01, BNP elevated to 169.0, urinalysis concerning for infection with leuk esterase, 11-20 white blood cells and rare bacteria.  Chest x-ray unremarkable.  CT head unremarkable.  Low suspicion for stroke as her neurologic exam is nonfocal.  No facial droop noted on exam.  No focal motor or sensory deficits on exam.  No additional cranial nerve deficits on exam.  Patient started on antibiotics and require hospital admission for altered mental status in the setting of UTI.   Additional history obtained: -Additional history obtained from son -External records from outside source obtained and reviewed including: Chart review including previous notes, labs, imaging, consultation notes   Lab Tests: -I ordered, reviewed, and interpreted  labs.   The pertinent results include:   Labs Reviewed  COMPREHENSIVE METABOLIC PANEL - Abnormal; Notable for the following components:      Result Value   Sodium 130 (*)    Potassium 3.4 (*)    Glucose, Bld 102 (*)    BUN 28 (*)    Creatinine, Ser 1.01 (*)    GFR, Estimated 56 (*)    All other components within normal limits  CBC WITH DIFFERENTIAL/PLATELET - Abnormal; Notable for the following components:   HCT 35.6 (*)    All other components within normal limits  BRAIN NATRIURETIC PEPTIDE - Abnormal; Notable for the following components:   B Natriuretic Peptide 169.0 (*)    All other components within normal limits  URINALYSIS, ROUTINE W REFLEX MICROSCOPIC - Abnormal; Notable for the following components:   Ketones, ur 20 (*)    Protein, ur 100 (*)    Leukocytes,Ua SMALL (*)    Bacteria, UA RARE (*)    All other components within normal limits  BASIC METABOLIC PANEL - Abnormal; Notable for the following components:   Sodium 130 (*)    Chloride 97 (*)    Glucose, Bld 100 (*)    Calcium 8.8 (*)    All other components within normal limits  CBC - Abnormal; Notable for the following components:   RBC 3.24 (*)    Hemoglobin 9.3 (*)    HCT 28.7 (*)    All other components within normal limits  CBG MONITORING, ED - Abnormal; Notable for the following components:   Glucose-Capillary 115 (*)    All other components within normal limits  RESPIRATORY PANEL BY PCR  URINE CULTURE  PHOSPHORUS  MAGNESIUM  TROPONIN I (HIGH SENSITIVITY)  TROPONIN I (HIGH SENSITIVITY)      EKG   EKG Interpretation Date/Time:  Tuesday July 23 2023 14:14:28 EST Ventricular Rate:  79 PR Interval:    QRS Duration:  66 QT Interval:  394 QTC Calculation: 451 R Axis:   22  Text Interpretation: Normal sinus rhythm Abnormal ECG When compared with ECG of 02-May-2023 13:46, Junctional rhythm has replaced Sinus rhythm Confirmed by Ilea Hilton ([id number]) on 07/23/2023 5:02:23 PM  Imaging  Studies ordered: I ordered imaging studies including chest x-ray, CT head I independently visualized and interpreted imaging. I agree with the radiologist interpretation   Medicines ordered and prescription drug management: Meds ordered this encounter  Medications   LORazepam (ATIVAN) injection 0.5 mg   cefTRIAXone (ROCEPHIN) 2 g in sodium chloride 0.9 % 100 mL IVPB    Antibiotic Indication::   UTI   enoxaparin (LOVENOX) injection 40 mg   lactated ringers bolus 500 mL   lactated ringers infusion   acetaminophen (TYLENOL) tablet 1,000 mg   ondansetron (ZOFRAN) injection 4 mg   DISCONTD: hydrOXYzine (ATARAX) tablet 25 mg   melatonin tablet 6 mg   cefTRIAXone (ROCEPHIN) 1 g in sodium chloride 0.9 % 100 mL IVPB    Antibiotic Indication::   UTI   traMADol (ULTRAM) tablet 50 mg   losartan (COZAAR) tablet 50 mg   pantoprazole (PROTONIX) EC tablet 40 mg   hyoscyamine (LEVSIN) tablet 0.125 mg   cyanocobalamin (VITAMIN B12) tablet 1,000 mcg   clonazePAM (KLONOPIN) tablet 1 mg   loratadine (CLARITIN) tablet 10 mg    -I have reviewed the patients home medicines and have made adjustments as needed  Critical interventions none   Cardiac Monitoring: The patient was maintained on a cardiac monitor.  I personally viewed and interpreted the cardiac monitored which showed an underlying rhythm of: NSR  Social Determinants of Health:  Factors impacting patients care include: none   Reevaluation: After the interventions noted above, I reevaluated the patient and found that they have :stayed the same  Co morbidities that complicate the patient evaluation  Past Medical History:  Diagnosis Date   Anemia    Collagenous colitis    colonoscopy 2009   Depression    Fibromyalgia    GERD (gastroesophageal reflux disease)    Hiatal hernia 2008   small   Hypercholesterolemia    Hypertension    IBS (irritable bowel syndrome)    Lymphocytic colitis    colonoscopy 2007, TCS 2013   S/P  endoscopy Sept 2008   small hh, s/p 13 French dilator      Dispostion: I considered admission for this patient, and patient require hospital admission for altered mental status UTI     Final Clinical Impression(s) / ED Diagnoses Final diagnoses:  Altered mental status, unspecified altered mental status type  Urinary tract infection without hematuria, site unspecified     @PCDICTATION @    Glendora Score, MD 07/24/23 1154

## 2023-07-24 NOTE — Plan of Care (Signed)
  Problem: Pain Management: Goal: General experience of comfort will improve Outcome: Progressing   Problem: Safety: Goal: Ability to remain free from injury will improve Outcome: Progressing

## 2023-07-24 NOTE — TOC Initial Note (Addendum)
Transition of Care [facility]) - Initial/Assessment Note    Patient Details  Name: Marie [name] MRN: [id number] Date of Birth: [birth year]  Transition of Care [facility]) CM/SW Contact:    Marie Bleak, RN Phone Number: 07/24/2023, 11:08 AM  Clinical Narrative:      Patient admitted with Urinary tract infection. PT is recommending SNF.  TOC confirmed with Marie [name] at [place] bed days. Patient was there fro 14 days in October.  They will accepted her back. CM spoke with patient. She lives with her son and walks with a walker. She is agreeable to sending FL2 to [facility] for bed offer, she will update her son. TOC following.         ADDENDUM:   Patient agreeable to return to [place] for rehab. Marie [name] started the [facility] AUTH.      Expected Discharge Plan: Skilled Nursing Facility Barriers to Discharge: Continued Medical Work up   Patient Goals and CMS Choice Patient states their goals for this hospitalization and ongoing recovery are:: agreeable to SNF CMS Medicare.gov Compare Post Acute Care list provided to:: Patient Choice offered to / list presented to : Patient      Expected Discharge Plan and Services     Post Acute Care Choice: Skilled Nursing Facility Living arrangements for the past 2 months: Single Family Home             Prior Living Arrangements/Services Living arrangements for the past 2 months: Single Family Home Lives with:: Adult Children Patient language and need for interpreter reviewed:: Yes        Need for Family Participation in Patient Care: Yes (Comment) Care giver support system in place?: Yes (comment) Current home services: DME Criminal Activity/Legal Involvement Pertinent to Current Situation/Hospitalization: No - Comment as needed  Activities of Daily Living   ADL Screening (condition at time of admission) Independently performs ADLs?: Yes (appropriate for developmental age) Is the patient deaf or have difficulty hearing?: No Does the patient  have difficulty seeing, even when wearing glasses/contacts?: No Does the patient have difficulty concentrating, remembering, or making decisions?: No  Permission Sought/Granted         Permission granted to share info w Relationship: son    Emotional Assessment     Affect (typically observed): Accepting Orientation: : Oriented to Self, Oriented to Place, Oriented to  Time, Oriented to Situation Alcohol / Substance Use: Not Applicable Psych Involvement: No (comment)  Admission diagnosis:  Urinary tract infection [N39.0] Urinary tract infection without hematuria, site unspecified [N39.0] Altered mental status, unspecified altered mental status type [R41.82] Patient Active Problem List   Diagnosis Date Noted   Urinary tract infection 07/23/2023   Atypical pneumonia 05/03/2023   Elevated troponin 05/03/2023   Near syncope 05/03/2023   Acute respiratory failure with hypoxia (HCC) 05/02/2023   Sensory disturbance 04/25/2023   Seizure-like activity (HCC) 04/25/2023   Speech abnormality 04/24/2023   Dyspnea 04/24/2023   Chest pain 11/21/2020   Dehydration 01/04/2017   LLQ pain 12/27/2015   Weight loss 12/17/2011   Lymphocytic colitis 11/21/2011   LUQ pain 11/21/2011   Diarrhea 04/30/2011   ANEMIA, MILD 08/05/2008   Depression 08/05/2008   Essential hypertension 08/05/2008   GERD 08/05/2008   Irritable bowel syndrome 08/05/2008   Diffuse connective tissue disease (HCC) 08/05/2008   Arthropathy 08/05/2008   FIBROMYALGIA 08/05/2008   ABDOMINAL BLOATING 08/05/2008   Incontinence of feces 08/05/2008   DIARRHEA, RECURRENT 08/05/2008   Abdominal pain 08/05/2008   DIVERTICULOSIS, COLON, HX OF 08/05/2008  PCP:  [facility] Pharmacy:   [facility] - [place], [place] - [address] [place] [address] Phone: [phone] Fax: [phone]   Social Drivers of Health (SDOH) Social History: SDOH Screenings   Food  Insecurity: No Food Insecurity (07/23/2023)  Housing: Low Risk  (07/23/2023)  Transportation Needs: No Transportation Needs (07/23/2023)  Utilities: Not At Risk (07/23/2023)  Tobacco Use: Low Risk  (07/23/2023)   SDOH Interventions:   Readmission Risk Interventions    05/07/2023   10:24 AM 05/03/2023   11:39 AM  Readmission Risk Prevention Plan  Post Dischage Appt Complete   Medication Screening Complete   Transportation Screening Complete Complete  PCP or Specialist Appt within 5-7 Days  Not Complete  Home Care Screening  Complete  Medication Review (RN CM)  Complete

## 2023-07-24 NOTE — [place] FL2 (Signed)
[place] MEDICAID FL2 LEVEL OF CARE FORM     IDENTIFICATION  Patient Name: Marie [name] Birthdate: 1940/12/27 Sex: female Admission Date (Current Location): 07/23/2023  [facility] and [place] Number:  [employer] and Address:  [facility],  [address], [place] [address]      Provider Number: [id number]  Attending Physician Name and Address:  Vassie Loll, MD  Relative Name and Phone Number:  Milus Glazier [phone])  [phone]    Current Level of Care: Hospital Recommended Level of Care: Skilled Nursing Facility Prior Approval Number:    Date Approved/Denied:   PASRR Number: #[id number]  Discharge Plan: SNF    Current Diagnoses: Patient Active Problem List   Diagnosis Date Noted   Urinary tract infection 07/23/2023   Atypical pneumonia 05/03/2023   Elevated troponin 05/03/2023   Near syncope 05/03/2023   Acute respiratory failure with hypoxia (HCC) 05/02/2023   Sensory disturbance 04/25/2023   Seizure-like activity (HCC) 04/25/2023   Speech abnormality 04/24/2023   Dyspnea 04/24/2023   Chest pain 11/21/2020   Dehydration 01/04/2017   LLQ pain 12/27/2015   Weight loss 12/17/2011   Lymphocytic colitis 11/21/2011   LUQ pain 11/21/2011   Diarrhea 04/30/2011   ANEMIA, MILD 08/05/2008   Depression 08/05/2008   Essential hypertension 08/05/2008   GERD 08/05/2008   Irritable bowel syndrome 08/05/2008   Diffuse connective tissue disease (HCC) 08/05/2008   Arthropathy 08/05/2008   FIBROMYALGIA 08/05/2008   ABDOMINAL BLOATING 08/05/2008   Incontinence of feces 08/05/2008   DIARRHEA, RECURRENT 08/05/2008   Abdominal pain 08/05/2008   DIVERTICULOSIS, COLON, HX OF 08/05/2008    Orientation RESPIRATION BLADDER Height & Weight     Self, Time, Situation, Place  Normal Continent Weight: 62.2 kg Height:  5\' 4"  (162.6 cm)  BEHAVIORAL SYMPTOMS/MOOD NEUROLOGICAL BOWEL NUTRITION STATUS      Continent Diet (See DC summary)  AMBULATORY STATUS  COMMUNICATION OF NEEDS Skin   Extensive Assist Verbally Normal                       Personal Care Assistance Level of Assistance  Bathing, Feeding, Dressing Bathing Assistance: Maximum assistance Feeding assistance: Limited assistance Dressing Assistance: Limited assistance     Functional Limitations Info  Sight, Hearing, Speech Sight Info: Adequate Hearing Info: Adequate Speech Info: Adequate    SPECIAL CARE FACTORS FREQUENCY  PT (By licensed PT)     PT Frequency: 5 times a week              Contractures Contractures Info: Not present    Additional Factors Info  Code Status, Allergies Code Status Info: FULL Allergies Info: Codeine, Promethazine           Current Medications (07/24/2023):  This is the current hospital active medication list Current Facility-Administered Medications  Medication Dose Route Frequency Provider Last Rate Last Admin   acetaminophen (TYLENOL) tablet 1,000 mg  1,000 mg Oral Q6H PRN Segars, Christiane Ha, MD       cefTRIAXone (ROCEPHIN) 1 g in sodium chloride 0.9 % 100 mL IVPB  1 g Intravenous Q24H Segars, Christiane Ha, MD       clonazePAM Scarlette Calico) tablet 1 mg  1 mg Oral BID PRN Dolly Rias, MD       cyanocobalamin (VITAMIN B12) tablet 1,000 mcg  1,000 mcg Oral Daily Dolly Rias, MD   1,000 mcg at 07/24/23 0856   enoxaparin (LOVENOX) injection 40 mg  40 mg Subcutaneous Q24H Dolly Rias, MD  40 mg at 07/23/23 2252   hyoscyamine (LEVSIN) tablet 0.125 mg  0.125 mg Oral Q8H PRN Dolly Rias, MD       loratadine (CLARITIN) tablet 10 mg  10 mg Oral Daily Vassie Loll, MD       losartan (COZAAR) tablet 50 mg  50 mg Oral Daily Dolly Rias, MD   50 mg at 07/24/23 0855   melatonin tablet 6 mg  6 mg Oral QHS Segars, Christiane Ha, MD   6 mg at 07/23/23 2250   ondansetron (ZOFRAN) injection 4 mg  4 mg Intravenous Q6H PRN Segars, Christiane Ha, MD       pantoprazole (PROTONIX) EC tablet 40 mg  40 mg Oral Daily Dolly Rias, MD   40  mg at 07/24/23 0856   traMADol (ULTRAM) tablet 50 mg  50 mg Oral Q6H PRN Dolly Rias, MD   50 mg at 07/23/23 2252     Discharge Medications: Please see discharge summary for a list of discharge medications.  Relevant Imaging Results:  Relevant Lab Results:   Additional Information SS# [id number]  Leitha Bleak, RN

## 2023-07-25 DIAGNOSIS — R4182 Altered mental status, unspecified: Secondary | ICD-10-CM | POA: Diagnosis not present

## 2023-07-25 DIAGNOSIS — N3000 Acute cystitis without hematuria: Secondary | ICD-10-CM | POA: Diagnosis not present

## 2023-07-25 LAB — URINE CULTURE: Culture: NO GROWTH

## 2023-07-25 MED ORDER — LORATADINE 10 MG PO TABS: 10.0000 mg | ORAL_TABLET | Freq: Every day | ORAL | 1 refills | Status: AC

## 2023-07-25 MED ORDER — TRAMADOL HCL 50 MG PO TABS: 50.0000 mg | ORAL_TABLET | Freq: Two times a day (BID) | ORAL | 0 refills | Status: DC | PRN

## 2023-07-25 MED ORDER — CLONAZEPAM 1 MG PO TABS: 1.0000 mg | ORAL_TABLET | Freq: Two times a day (BID) | ORAL | 0 refills | Status: DC | PRN

## 2023-07-25 MED ORDER — ACETAMINOPHEN 500 MG PO TABS: 1000.0000 mg | ORAL_TABLET | Freq: Three times a day (TID) | ORAL | Status: AC | PRN

## 2023-07-25 MED ORDER — CEFADROXIL 500 MG PO CAPS: 500.0000 mg | ORAL_CAPSULE | Freq: Two times a day (BID) | ORAL | 0 refills | Status: AC

## 2023-07-25 NOTE — Plan of Care (Signed)
  Problem: Education: Goal: Knowledge of General Education information will improve Description Including pain rating scale, medication(s)/side effects and non-pharmacologic comfort measures Outcome: Progressing   Problem: Health Behavior/Discharge Planning: Goal: Ability to manage health-related needs will improve Outcome: Progressing   

## 2023-07-25 NOTE — Progress Notes (Signed)
Mobility Specialist Progress Note:    07/25/23 1357  Mobility  Activity Ambulated with assistance in room  Level of Assistance Minimal assist, patient does 75% or more  Assistive Device Front wheel walker  Distance Ambulated (ft) 10 ft  Range of Motion/Exercises Active;All extremities  Activity Response Tolerated well  Mobility Referral Yes  Mobility visit 1 Mobility  Mobility Specialist Start Time (ACUTE ONLY) 1340  Mobility Specialist Stop Time (ACUTE ONLY) 1357  Mobility Specialist Time Calculation (min) (ACUTE ONLY) 17 min   Pt received in chair, agreeable to mobility. Required MinA to stand and ambulate with RW. Tolerated well, c/o back pain. Returned to chair, alarm on. Call bell in reach, all needs met.   Lawerance Bach Mobility Specialist Please contact via Special educational needs teacher or  Rehab office at [phone]

## 2023-07-25 NOTE — Discharge Summary (Signed)
Physician Discharge Summary   Patient: Marie [name] MRN: [id number] DOB: 1940-11-16  Admit date:     07/23/2023  Discharge date: 07/25/23  Discharge Physician: Vassie Loll   PCP: [facility]   Recommendations at discharge:  Repeat basic metabolic panel to follow electrolytes and renal function Reassess blood pressure and adjust antihypertensive treatment as needed   Discharge Diagnoses: Urinary tract infection Altered mental status (acute metabolic encephalopathy) Dehydration/hyponatremia Gastroesophageal flux disease Hypertension Recent acute sinusitis/URI History of collagenous colitis/IBS History of depression/anxiety Chronic pain/arthritis Physical deconditioning/weakness.  Brief hospital admission course: As per H&P written by Dr. Lazarus Salines on 07/23/2023 Marie [name] is a 82 y.o. female with hx of hypertension, hyperlipidemia, mood disorder, arthritis, chronic pain, history collagenous colitis, IBS, recurrent UTI, who was brought in by her son due to weakness and altered mental status.  Patient is unable to provide significant history.  Provided by her son at the bedside.  He reports that over the past few weeks she has been dealing with sinus issues recently diagnosed with sinusitis and prescribed Augmentin beginning 12/10.  Over the past 3 days she is been generally weak, no focal weakness.  Having diffuse pain.  Seems to have more shortness of breath, sneezing, rhinorrhea recently as well.  Today son felt she was "slower "referring to mental status and decided to bring her in.  She has a history of UTIs and some thought symptoms of her mental status were similar to UTIs she has had in the past.    Assessment and Plan: 1-acute metabolic encephalopathy -Appears to be secondary to patient's UTI. -After fluid resuscitation and initiation of antibiotics patient' mentation back to baseline and hemodynamically stable for discharge to nursing  facility. -No fever, no chest pain, no nausea or vomiting -Continue to to be weak/deconditioned. -Continue to maintain adequate hydration -Discharge on oral cefadroxil to complete therapy.   2-urinary tract infection -Prior cultures demonstrating E. coli resistant to ampicillin -Patient has been discharged on oral cefadroxil to complete antibiotic therapy -Patient advised to maintain adequate hydration. -Patient reports no nausea, no vomiting, no fever.   3-physical deconditioning/weakness -Seen by physical therapy with recommendation for skilled nursing facility at discharge -Appreciate assistance and input by [facility] service -Patient will be discharged to [facility] [facility] facility for further care and rehab.   4-dehydration/hyponatremia -Patient advised to maintain adequate oral intake -Fluid resuscitation provided and patient volume status and electrolytes stabilized prior to discharge. -Repeat basic metabolic panel to follow electrolytes trend/stability. -Diuretic has been discontinued discharge.   5-hypertension -Continue treatment with losartan -Follow blood pressure trend and use as needed labetalol -HCTZ has been discontinued.   6-recent acute sinusitis/URI -Continue treatment with cefadroxil as mentioned above (which will also cover adequately for any URI). -Loratadine has also been initiated. -Patient expressed feeling less congested and is not requiring oxygen supplementation.   7-gastroesophageal flux disease -Continue PPI.   8-history of collagenous colitis/IBS -Continue treatment with Levsin -Patient reports no nausea, no vomiting and no abdominal pain.   9-history of depression/anxiety -Continue citalopram and as needed clonazepam. -No suicidal ideation or hallucinations.   10-chronic pain/arthritis -Continue as needed home analgesic therapy -Continue supportive care and pursued rehabilitation as per SNF protocol..  Consultants: None Procedures  performed: See low for x-ray report. Disposition: Skilled nursing facility Diet recommendation: Heart healthy diet.  DISCHARGE MEDICATION: Allergies as of 07/25/2023       Reactions   Codeine Hives, Swelling   Promethazine Hcl Swelling  Medication List     STOP taking these medications    amoxicillin-clavulanate 875-125 MG tablet Commonly known as: AUGMENTIN   DM-GUAIFENESIN ER PO   hydrochlorothiazide 12.5 MG tablet Commonly known as: HYDRODIURIL   ibuprofen 200 MG tablet Commonly known as: ADVIL       TAKE these medications    acetaminophen 500 MG tablet Commonly known as: TYLENOL Take 2 tablets (1,000 mg total) by mouth every 8 (eight) hours as needed for mild pain (pain score 1-3), fever or headache.   CALCIUM PO Take 1 tablet by mouth every morning.   cefadroxil 500 MG capsule Commonly known as: DURICEF Take 1 capsule (500 mg total) by mouth 2 (two) times daily for 5 days.   CENTRUM SILVER ULTRA WOMENS PO Take 1 tablet by mouth every morning.   clonazePAM 1 MG tablet Commonly known as: KlonoPIN Take 1 tablet (1 mg total) by mouth 2 (two) times daily as needed for anxiety.   cyanocobalamin 1000 MCG tablet Commonly known as: VITAMIN B12 Take 1,000 mcg by mouth daily.   diclofenac Sodium 1 % Gel Commonly known as: VOLTAREN Apply 4 g topically 4 (four) times daily as needed (joint and musculoskeletal pain). Apply to affected areas   esomeprazole 40 MG capsule Commonly known as: NEXIUM Take 40 mg by mouth daily.   Fish Oil 1000 MG Caps Take 2 capsules by mouth daily.   hyoscyamine 0.125 MG tablet Commonly known as: LEVSIN Take 0.125 mg by mouth every 8 (eight) hours as needed for cramping.   loratadine 10 MG tablet Commonly known as: CLARITIN Take 1 tablet (10 mg total) by mouth daily. Start taking on: July 26, 2023   losartan 50 MG tablet Commonly known as: COZAAR Take 50 mg by mouth daily.   traMADol 50 MG tablet Commonly  known as: ULTRAM Take 1 tablet (50 mg total) by mouth every 12 (twelve) hours as needed for severe pain (pain score 7-10). What changed:  when to take this reasons to take this   VITAMIN C PO Take 1 tablet by mouth every morning.        Contact information for follow-up providers     [facility] Schedule an appointment as soon as possible for a visit in 10 day(s).   Contact information: [address] [place] [address] [phone]              Contact information for after-discharge care     Destination     [facility] .   Service: Skilled Nursing Contact information: [address] [place] [address] [phone]                    Discharge Exam: Ceasar Mons Weights   07/23/23 1411 07/23/23 2030  Weight: 64.4 kg 62.2 kg   General exam: Alert, awake, following commands appropriately and with mentation back to baseline at discharge.  No fever, no nausea, no vomiting. Respiratory system: Clear to auscultation. Respiratory effort normal.  Good saturation on room air. Cardiovascular system:RRR. No rubs or gallops. Gastrointestinal system: Abdomen is nondistended, soft and nontender. No organomegaly or masses felt. Normal bowel sounds heard. Central nervous system: Moving 4 limbs spontaneously.  No focal neurological deficits. Extremities: No cyanosis or clubbing. Skin: No petechiae. Psychiatry: Mood & affect appropriate.    Condition at discharge: Stable improved.  The results of significant diagnostics from this hospitalization (including imaging, microbiology, ancillary and laboratory) are listed below for reference.  Imaging Studies: CT Head Wo Contrast Result Date: 07/23/2023 CLINICAL DATA:  Shortness of breath chills EXAM: CT HEAD WITHOUT CONTRAST TECHNIQUE: Contiguous axial images were obtained from the base of the skull through the vertex without intravenous contrast.  RADIATION DOSE REDUCTION: This exam was performed according to the departmental dose-optimization program which includes automated exposure control, adjustment of the mA and/or kV according to patient size and/or use of iterative reconstruction technique. COMPARISON:  MRI 04/24/2023 trauma CT brain 04/24/2023 FINDINGS: Brain: No acute territorial infarction, hemorrhage or intracranial mass. Atrophy and moderate chronic small vessel ischemic changes of the white matter. Nonenlarged ventricles Vascular: No hyperdense vessels.  Carotid vascular calcification Skull: Normal. Negative for fracture or focal lesion. Sinuses/Orbits: No acute finding. Other: None IMPRESSION: 1. No CT evidence for acute intracranial abnormality. 2. Atrophy and chronic small vessel ischemic changes of the white matter. Electronically Signed   By: Jasmine Pang M.D.   On: 07/23/2023 18:40   DG Chest 2 View Result Date: 07/23/2023 CLINICAL DATA:  Shortness of breath and chills EXAM: CHEST - 2 VIEW COMPARISON:  05/02/2023 FINDINGS: Cardiomediastinal silhouette and pulmonary vasculature are within normal limits. Lungs are clear. IMPRESSION: No acute cardiopulmonary process. Electronically Signed   By: Acquanetta Belling M.D.   On: 07/23/2023 16:28    Microbiology: Results for orders placed or performed during the hospital encounter of 07/23/23  Urine Culture (for pregnant, neutropenic or urologic patients or patients with an indwelling urinary catheter)     Status: None   Collection Time: 07/23/23  9:00 PM   Specimen: Urine, Clean Catch  Result Value Ref Range Status   Specimen Description   Final    URINE, CLEAN CATCH Performed at [facility], [address], [place], [place] [address]    Special Requests   Final    NONE Performed at [facility], [address] [place], [place] [address]    Culture   Final    NO GROWTH Performed at [facility], [address] [place], [place] [address]    Report Status 07/25/2023 FINAL   Final  Respiratory (~20 pathogens) panel by PCR     Status: None   Collection Time: 07/24/23  5:30 AM   Specimen: Nasopharyngeal Swab; Respiratory  Result Value Ref Range Status   Adenovirus NOT DETECTED NOT DETECTED Final   Coronavirus 229E NOT DETECTED NOT DETECTED Final    Comment: (NOTE) The Coronavirus on the Respiratory Panel, DOES NOT test for the novel  Coronavirus (2019 nCoV)    Coronavirus HKU1 NOT DETECTED NOT DETECTED Final   Coronavirus NL63 NOT DETECTED NOT DETECTED Final   Coronavirus OC43 NOT DETECTED NOT DETECTED Final   Metapneumovirus NOT DETECTED NOT DETECTED Final   Rhinovirus / Enterovirus NOT DETECTED NOT DETECTED Final   Influenza A NOT DETECTED NOT DETECTED Final   Influenza B NOT DETECTED NOT DETECTED Final   Parainfluenza Virus 1 NOT DETECTED NOT DETECTED Final   Parainfluenza Virus 2 NOT DETECTED NOT DETECTED Final   Parainfluenza Virus 3 NOT DETECTED NOT DETECTED Final   Parainfluenza Virus 4 NOT DETECTED NOT DETECTED Final   Respiratory Syncytial Virus NOT DETECTED NOT DETECTED Final   Bordetella pertussis NOT DETECTED NOT DETECTED Final   Bordetella Parapertussis NOT DETECTED NOT DETECTED Final   Chlamydophila pneumoniae NOT DETECTED NOT DETECTED Final   Mycoplasma pneumoniae NOT DETECTED NOT DETECTED Final    Comment: Performed at [facility], [address] [place], [place] [address]  Labs: CBC: Recent Labs  Lab 07/23/23 1700 07/24/23 0420  WBC 7.4 7.2  NEUTROABS 3.8  --   HGB 12.0 9.3*  HCT 35.6* 28.7*  MCV 86.4 88.6  PLT 249 200   Basic Metabolic Panel: Recent Labs  Lab 07/23/23 1700 07/24/23 0420  NA 130* 130*  K 3.4* 3.5  CL 99 97*  CO2 22 24  GLUCOSE 102* 100*  BUN 28* 23  CREATININE 1.01* 0.90  CALCIUM 9.6 8.8*  MG  --  1.7  PHOS  --  3.5   Liver Function Tests: Recent Labs  Lab 07/23/23 1700  AST 24  ALT 15  ALKPHOS 62  BILITOT 0.9  PROT 7.9  ALBUMIN 3.7   CBG: Recent Labs  Lab 07/23/23 1735   GLUCAP 115*    Discharge time spent: greater than 30 minutes.  Signed: Vassie Loll, MD [facility] 07/25/2023

## 2023-07-25 NOTE — TOC Transition Note (Signed)
Transition of Care [facility]) - Discharge Note   Patient Details  Name: Marie [name] MRN: [id number] Date of Birth: 1940/11/21  Transition of Care [facility]) CM/SW Contact:  Elliot Gault, LCSW Phone Number: 07/25/2023, 2:06 PM   Clinical Narrative:     Pt medically stable for dc today per MD. SNF auth received. Jill Side at [facility] states they can admit pt today.  Updated pt who remains in agreement with dc plan. Updated pt's son at pt request.  DC clinical sent electronically. RN to call report. [facility] w/c Zenaida Niece arranged for transport.  No other TOC needs for dc.  Final next level of care: Skilled Nursing Facility Barriers to Discharge: Barriers Resolved   Patient Goals and CMS Choice Patient states their goals for this hospitalization and ongoing recovery are:: agreeable to SNF CMS Medicare.gov Compare Post Acute Care list provided to:: Patient Choice offered to / list presented to : Patient      Discharge Placement              Patient [facility] bed at: [facility] Patient to be transferred to facility by: [facility] Name of family member notified: Milus Glazier Patient and family notified of of transfer: 07/25/23  Discharge Plan and Services Additional resources added to the After Visit Summary for       Post Acute Care Choice: Skilled Nursing Facility                               Social Drivers of Health (SDOH) Interventions SDOH Screenings   Food Insecurity: No Food Insecurity (07/23/2023)  Housing: Low Risk  (07/23/2023)  Transportation Needs: No Transportation Needs (07/23/2023)  Utilities: Not At Risk (07/23/2023)  Tobacco Use: Low Risk  (07/23/2023)     Readmission Risk Interventions    05/07/2023   10:24 AM 05/03/2023   11:39 AM  Readmission Risk Prevention Plan  Post Dischage Appt Complete   Medication Screening Complete   Transportation Screening Complete Complete  PCP or Specialist Appt within 5-7 Days  Not Complete  Home  Care Screening  Complete  Medication Review (RN CM)  Complete

## 2023-07-25 NOTE — Progress Notes (Signed)
Physical Therapy Treatment Patient Details Name: Marie [name] MRN: [id number] DOB: 15-Dec-1940 Today's Date: 07/25/2023   History of Present Illness a 82 y.o. female with hx of hypertension, hyperlipidemia, mood disorder, arthritis, chronic pain, history collagenous colitis, IBS, recurrent UTI, who was brought in by her son due to weakness and altered mental status.  Patient is unable to provide significant history.  Provided by her son at the bedside.  He reports that over the past few weeks she has been dealing with sinus issues recently diagnosed with sinusitis and prescribed Augmentin beginning 12/10.  Over the past 3 days she is been generally weak, no focal weakness.  Having diffuse pain.  Seems to have more shortness of breath, sneezing, rhinorrhea recently as well.  Today son felt she was "slower "referring to mental status and decided to bring her in.  She has a history of UTIs and some thought symptoms of her mental status were similar to UTIs she has had in the past.    PT Comments  Pt friendly and willing to participate with therapy.  Presents with slow labored movements with mod I bed mobility and use of handrails.  Min A with STS.  Pt with need for restroom break, used RW safely with slow movements.  Pt reports of dizziness when sitting on commode, BP taken 117/63 mm Hg.  EOS pt left in chair with call bell within reach and chair alarm set.      If plan is discharge home, recommend the following:     Can travel by private vehicle        Equipment Recommendations       Recommendations for Other Services       Precautions / Restrictions Precautions Precautions: Fall Restrictions Weight Bearing Restrictions Per Provider Order: No     Mobility  Bed Mobility Overal bed mobility: Modified Independent             General bed mobility comments: slow, labored sitting to EOB with use of bedrail and increased time    Transfers Overall transfer level: Modified  independent   Transfers: Sit to/from Stand Sit to Stand: Min assist           General transfer comment: Cueing for hand placement to assist with STS safely, used RW for stability    Ambulation/Gait Ambulation/Gait assistance: Min assist Gait Distance (Feet): 20 Feet Assistive device: Rolling walker (2 wheels) Gait Pattern/deviations: Step-through pattern, Decreased step length - right, Decreased step length - left, Decreased stance time - right, Decreased stance time - left, Decreased weight shift to left, Decreased dorsiflexion - left, Decreased dorsiflexion - right, Shuffle       General Gait Details: slow labored gait mechaincs with no LOB episodes   Stairs             Wheelchair Mobility     Tilt Bed    Modified Rankin (Stroke Patients Only)       Balance                                            Cognition Arousal: Alert Behavior During Therapy: WFL for tasks assessed/performed Overall Cognitive Status: Within Functional Limits for tasks assessed  Exercises      General Comments        Pertinent Vitals/Pain Pain Assessment Pain Assessment: 0-10 Pain Score: 6  Pain Descriptors / Indicators: Discomfort (grinding) Pain Intervention(s): Monitored during session, Limited activity within patient's tolerance, Repositioned    Home Living                          Prior Function            PT Goals (current goals can now be found in the care plan section)      Frequency           PT Plan      Co-evaluation              AM-PAC PT "6 Clicks" Mobility   Outcome Measure  Help needed turning from your back to your side while in a flat bed without using bedrails?: None Help needed moving from lying on your back to sitting on the side of a flat bed without using bedrails?: A Little Help needed moving to and from a bed to a chair (including a  wheelchair)?: A Little Help needed standing up from a chair using your arms (e.g., wheelchair or bedside chair)?: A Little Help needed to walk in hospital room?: A Little Help needed climbing 3-5 steps with a railing? : Total 6 Click Score: 17    End of Session Equipment Utilized During Treatment: Gait belt Activity Tolerance: Patient tolerated treatment well Patient left: in chair;with call bell/phone within reach;with chair alarm set Nurse Communication: Mobility status PT Visit Diagnosis: Unsteadiness on feet (R26.81);Muscle weakness (generalized) (M62.81)     Time: 8657-8469 PT Time Calculation (min) (ACUTE ONLY): 25 min  Charges:    $Therapeutic Activity: 23-37 mins PT General Charges $$ ACUTE PT VISIT: 1 Visit                     Becky Sax, LPTA/CLT; CBIS [phone]  Juel Burrow 07/25/2023, 11:13 AM

## 2023-07-25 NOTE — Care Management Important Message (Signed)
Important Message  Patient Details  Name: Marie [name] MRN: [id number] Date of Birth: [birth year]   Important Message Given:  N/A - LOS <3 / Initial given by admissions     Corey Harold 07/25/2023, 2:30 PM

## 2023-07-25 NOTE — Progress Notes (Signed)
Called report to Nurse Elease Hashimoto at [facility]. Pt resting in room awaiting transport.

## 2023-08-05 ENCOUNTER — Emergency Department (HOSPITAL_COMMUNITY)
Admission: EM | Admit: 2023-08-05 | Discharge: 2023-08-05 | Discharge status: Against Medical Advice | Payer: Medicare (Managed Care) | Attending: Emergency Medicine | Admitting: Emergency Medicine

## 2023-08-05 ENCOUNTER — Encounter (HOSPITAL_COMMUNITY): Payer: Self-pay

## 2023-08-05 ENCOUNTER — Other Ambulatory Visit: Payer: Self-pay

## 2023-08-05 DIAGNOSIS — M79601 Pain in right arm: Secondary | ICD-10-CM | POA: Insufficient documentation

## 2023-08-05 DIAGNOSIS — Z5321 Procedure and treatment not carried out due to patient leaving prior to being seen by health care provider: Secondary | ICD-10-CM | POA: Insufficient documentation

## 2023-08-05 DIAGNOSIS — M791 Myalgia, unspecified site: Secondary | ICD-10-CM | POA: Insufficient documentation

## 2023-08-05 NOTE — ED Notes (Signed)
Pt called to go to room in the back, pt no longer in waiting room.

## 2023-08-05 NOTE — ED Triage Notes (Signed)
Pt presents to ED with complaints of generalized body pain. Pt states her pain in her right arm is really bad. Pt was admitting a couple weeks ago for UTI and states her pain was like this then.

## 2023-08-14 ENCOUNTER — Other Ambulatory Visit: Payer: Self-pay | Admitting: Orthopedic Surgery

## 2023-08-14 ENCOUNTER — Encounter: Payer: Self-pay | Admitting: Orthopedic Surgery

## 2023-08-14 ENCOUNTER — Other Ambulatory Visit (INDEPENDENT_AMBULATORY_CARE_PROVIDER_SITE_OTHER): Payer: Medicare (Managed Care)

## 2023-08-14 ENCOUNTER — Ambulatory Visit: Payer: Medicare HMO | Admitting: Orthopedic Surgery

## 2023-08-14 ENCOUNTER — Other Ambulatory Visit (INDEPENDENT_AMBULATORY_CARE_PROVIDER_SITE_OTHER): Payer: Self-pay

## 2023-08-14 VITALS — Ht 64.0 in | Wt 139.0 lb

## 2023-08-14 DIAGNOSIS — M25511 Pain in right shoulder: Secondary | ICD-10-CM | POA: Diagnosis not present

## 2023-08-14 DIAGNOSIS — M1711 Unilateral primary osteoarthritis, right knee: Secondary | ICD-10-CM

## 2023-08-14 DIAGNOSIS — G8929 Other chronic pain: Secondary | ICD-10-CM

## 2023-08-14 DIAGNOSIS — M12811 Other specific arthropathies, not elsewhere classified, right shoulder: Secondary | ICD-10-CM | POA: Diagnosis not present

## 2023-08-14 NOTE — Patient Instructions (Signed)

## 2023-08-14 NOTE — Progress Notes (Signed)
 New Patient Visit  Assessment: Marie [name] is a 83 y.o. female with the following: 1. Rotator cuff arthropathy of right shoulder 2. 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  She has not had a steroid injection in a long time.  I have offered her steroid injection.  She is elected to proceed.  She may ultimately benefit from right total knee arthroplasty, but may not be good surgical candidate.  She has had some hospitalizations within the last year.  Nonetheless, I will discuss this with my partner, who may be willing to see her and offer her surgery.  Regarding her right shoulder, she has rotator cuff arthropathy.  Radiographs demonstrates proximal humeral migration, with a complete loss of space between the proximal humerus and the acromion.  She is having pain in the right shoulder.  I offered her steroid injection, but made it abundantly clear, that this will not improve function.  She will return to clinic as needed.   Procedure note injection - Right shoulder    Verbal consent was obtained to inject the right shoulder, subacromial space Timeout was completed to confirm the site of injection.   The skin was prepped with alcohol and ethyl chloride was sprayed at the injection site.  A 21-gauge needle was used to inject 40 mg of Depo-Medrol and 1% lidocaine (4 cc) into the subacromial space of the right shoulder using a posterolateral approach.  There were no complications.  A sterile bandage was applied.    Procedure note injection Right knee joint   Verbal consent was obtained to inject the right knee joint  Timeout was completed to confirm the site of injection.  The skin was prepped with alcohol and ethyl chloride was sprayed at the injection site.  A 21-gauge needle was used to inject 40 mg of Depo-Medrol and 1% lidocaine (4 cc) into the right knee using an anterolateral approach.  There were no  complications. A sterile bandage was applied.   Follow-up: Return if symptoms worsen or fail to improve.  Subjective:  Chief Complaint  Patient presents with   Knee Pain    R knee for yrs and is getting worse over the past few mos.    Shoulder Pain    R shoulder pain since the Fall '24    History of Present Illness: Marie [name] is a 82 y.o. female who presents for evaluation of right shoulder and right knee pain.  She has had progressively worsening right knee pain for several years.  She does have a history of left total knee arthroplasty, that is done well.  Dr. Cheyenne in [place] completed the surgery.  She continue to follow-up with him, and did discuss right total knee arthroplasty.  However, he recommended pain management.  She has had several injections in the right knee.  She states these do not help.  She has been using a walker for the past year.  Regarding her right shoulder, she notes that she has had progressively worsening pain and function of the right shoulder for the past several months.  No specific injury.  She has difficulty getting her hand overhead.  No recent injections.  Of note, she has been hospitalized several times within the last year.  Most recently, she developed pneumonia, and was hospitalized in the fall.  In addition, she is currently scheduled to see an ENT in regards to some breathing issues that  she is having.   Review of Systems: No fevers or chills No numbness or tingling No chest pain No shortness of breath No bowel or bladder dysfunction No GI distress No headaches   Medical History:  Past Medical History:  Diagnosis Date   Anemia    Collagenous colitis    colonoscopy 2009   Depression    Fibromyalgia    GERD (gastroesophageal reflux disease)    Hiatal hernia 2008   small   Hypercholesterolemia    Hypertension    IBS (irritable bowel syndrome)    Lymphocytic colitis    colonoscopy 2007, TCS 2013   S/P endoscopy Sept 2008    small hh, s/p 69 French dilator    Past Surgical History:  Procedure Laterality Date   ABDOMINAL HYSTERECTOMY     BIOPSY  01/09/2017   Procedure: BIOPSY;  Surgeon: Shaaron Lamar HERO, MD;  [facility]: [facility];  Service: Endoscopy;;  colon   CATARACT EXTRACTION Bilateral 2021   COLONOSCOPY  04/13/2008   Dr.Rourk- anal papilla and internal hemorrhage otherwise normal /left side diverticula   COLONOSCOPY  09/20/2011   Dr.Rourk- Adequate preparation. Internal hemorrhoids; otherwise normal/ Left-sided diverticula, biopsies positive for lymphocytic colitis   COLONOSCOPY N/A 01/09/2017   Procedure: COLONOSCOPY;  Surgeon: Shaaron Lamar HERO, MD;  [facility]: [facility];  Service: Endoscopy;  Laterality: N/A;  7:30am   ESOPHAGOGASTRODUODENOSCOPY  04/22/2007   Dr.Rourk- normal esophagus, small hiatal hernia o/w normal stomach, D1 and D2 s/p passage of a 56-French maloney dilator   fistula of colon     FLEXIBLE SIGMOIDOSCOPY  05/29/2006   Dr.Rehman- No evidence of pseudomembranous or acute colitis.   FOOT SURGERY     KNEE SURGERY     NOSE SURGERY     VEIN SURGERY Bilateral 2021    Family History  Problem Relation Age of Onset   Colon cancer Maternal Uncle    Colon cancer Maternal Grandmother    Social History   Tobacco Use   Smoking status: Never   Smokeless tobacco: Never  Vaping Use   Vaping status: Never Used  Substance Use Topics   Alcohol use: No   Drug use: No    Allergies  Allergen Reactions   Codeine Hives and Swelling   Promethazine Hcl Swelling    Current Meds  Medication Sig   acetaminophen  (TYLENOL ) 500 MG tablet Take 2 tablets (1,000 mg total) by mouth every 8 (eight) hours as needed for mild pain (pain score 1-3), fever or headache.   Ascorbic Acid (VITAMIN C PO) Take 1 tablet by mouth every morning.   CALCIUM PO Take 1 tablet by mouth every morning.   clonazePAM  (KLONOPIN ) 1 MG tablet Take 1 tablet (1 mg total) by mouth 2 (two) times daily as needed for anxiety.    diclofenac  Sodium (VOLTAREN ) 1 % GEL Apply 4 g topically 4 (four) times daily as needed (joint and musculoskeletal pain). Apply to affected areas   esomeprazole  (NEXIUM ) 40 MG capsule Take 40 mg by mouth daily.    hyoscyamine  (LEVSIN ) 0.125 MG tablet Take 0.125 mg by mouth every 8 (eight) hours as needed for cramping.   loratadine  (CLARITIN ) 10 MG tablet Take 1 tablet (10 mg total) by mouth daily.   losartan  (COZAAR ) 50 MG tablet Take 50 mg by mouth daily.   Multiple Vitamins-Minerals (CENTRUM SILVER ULTRA WOMENS PO) Take 1 tablet by mouth every morning.   Omega-3 Fatty Acids (FISH OIL) 1000 MG CAPS Take 2 capsules by mouth daily.  traMADol  (ULTRAM ) 50 MG tablet Take 1 tablet (50 mg total) by mouth every 12 (twelve) hours as needed for severe pain (pain score 7-10).   vitamin B-12 (CYANOCOBALAMIN ) 1000 MCG tablet Take 1,000 mcg by mouth daily.    Objective: Ht 5' 4 (1.626 m)   Wt 139 lb (63 kg)   BMI 23.86 kg/m   Physical Exam:  General: Elderly female. and Alert and oriented.  No acute distress Gait: Ambulates with the assistance of a walker.  Evaluation the right knee demonstrates valgus alignment.  She has full extension, with flexion to approximately 95 degrees.  Tenderness to palpation over the lateral knee.  No increased laxity varus or valgus stress.  Negative Lachman.  Right shoulder without deformity.  No redness.  Mild tenderness to palpation.  Forward flexion limited to 80 degrees.  Abduction at her side to 80 degrees.  Fingers are warm and well-perfused.  Sensation intact in the axillary nerve distribution.  IMAGING: I personally ordered and reviewed the following images   X-rays of the right shoulder were obtained in clinic today.  No acute injuries are noted.  Degenerative changes noted within the glenohumeral joint.  There is proximal humeral migration, with the humeral head abutting the undersurface of the acromion.  No bony lesions.  Poor bone quality  overall.  Impression: Right shoulder x-ray with proximal humeral migration, consistent with arthropathy   X-rays of the right knee were obtained in clinic today.  No acute injuries.  Valgus alignment overall.  Complete loss of joint space within the medial lateral compartment.  Subchondral sclerosis within the lateral compartment, with associated osteophytes.  There appear to be subchondral cysts, especially within the midportion of the tibial plateau.  Impression: Severe right knee arthritis with valgus alignment   New Medications:  No orders of the defined types were placed in this encounter.     Oneil DELENA Horde, MD  08/14/2023 2:14 PM

## 2023-08-14 NOTE — Progress Notes (Deleted)
 New Patient Visit  Assessment: Marie [name] is a 83 y.o. female with the following: 1. Chronic right shoulder pain ***  2. Chronic pain of right knee ***   Plan: [name]   Follow-up: No follow-ups on file.  Subjective:  Chief Complaint  Patient presents with   Knee Pain    R knee for yrs and is getting worse over the past few mos.    Shoulder Pain    R shoulder pain since the Fall '24    History of Present Illness: Marie [name] is a 83 y.o. female who {Presentation:27320} for evaluation of    Review of Systems: No fevers or chills*** No numbness or tingling No chest pain No shortness of breath No bowel or bladder dysfunction No GI distress No headaches   Medical History:  Past Medical History:  Diagnosis Date   Anemia    Collagenous colitis    colonoscopy 2009   Depression    Fibromyalgia    GERD (gastroesophageal reflux disease)    Hiatal hernia 2008   small   Hypercholesterolemia    Hypertension    IBS (irritable bowel syndrome)    Lymphocytic colitis    colonoscopy 2007, TCS 2013   S/P endoscopy Sept 2008   small hh, s/p 48 French dilator    Past Surgical History:  Procedure Laterality Date   ABDOMINAL HYSTERECTOMY     BIOPSY  01/09/2017   Procedure: BIOPSY;  Surgeon: Shaaron Lamar HERO, MD;  [facility];  Service: Endoscopy;;  colon   CATARACT EXTRACTION Bilateral 2021   COLONOSCOPY  04/13/2008   Dr.Rourk- anal papilla and internal hemorrhage otherwise normal /left side diverticula   COLONOSCOPY  09/20/2011   Dr.Rourk- Adequate preparation. Internal hemorrhoids; otherwise normal/ Left-sided diverticula, biopsies positive for lymphocytic colitis   COLONOSCOPY N/A 01/09/2017   Procedure: COLONOSCOPY;  Surgeon: Shaaron Lamar HERO, MD;  [facility];  Service: Endoscopy;  Laterality: N/A;  7:30am   ESOPHAGOGASTRODUODENOSCOPY  04/22/2007   Dr.Rourk- normal esophagus, small hiatal hernia o/w normal stomach, D1 and D2 s/p  passage of a 56-French maloney dilator   fistula of colon     FLEXIBLE SIGMOIDOSCOPY  05/29/2006   Dr.Rehman- No evidence of pseudomembranous or acute colitis.   FOOT SURGERY     KNEE SURGERY     NOSE SURGERY     VEIN SURGERY Bilateral 2021    Family History  Problem Relation Age of Onset   Colon cancer Maternal Uncle    Colon cancer Maternal Grandmother    Social History   Tobacco Use   Smoking status: Never   Smokeless tobacco: Never  Vaping Use   Vaping status: Never Used  Substance Use Topics   Alcohol use: No   Drug use: No    Allergies  Allergen Reactions   Codeine Hives and Swelling   Promethazine Hcl Swelling    No outpatient medications have been marked as taking for the 08/14/23 encounter (Office Visit) with Onesimo Oneil LABOR, MD.    Objective: Ht 5' 4 (1.626 m)   Wt 139 lb (63 kg)   BMI 23.86 kg/m   Physical Exam:  General: {General PE Findings:25791} Gait: {Gait:25792}    IMAGING: {XR Reviewed:24899}   New Medications:  No orders of the defined types were placed in this encounter.     Oneil LABOR Onesimo, MD  08/14/2023 11:44 AM

## 2023-08-29 ENCOUNTER — Ambulatory Visit (INDEPENDENT_AMBULATORY_CARE_PROVIDER_SITE_OTHER): Payer: Medicare HMO | Admitting: Gastroenterology

## 2023-08-29 ENCOUNTER — Encounter: Payer: Self-pay | Admitting: Gastroenterology

## 2023-08-29 VITALS — BP 112/57 | HR 65 | Temp 97.2°F | Ht 63.0 in | Wt 139.4 lb

## 2023-08-29 DIAGNOSIS — K5909 Other constipation: Secondary | ICD-10-CM

## 2023-08-29 DIAGNOSIS — K5900 Constipation, unspecified: Secondary | ICD-10-CM

## 2023-08-29 DIAGNOSIS — K219 Gastro-esophageal reflux disease without esophagitis: Secondary | ICD-10-CM | POA: Diagnosis not present

## 2023-08-29 DIAGNOSIS — K52832 Lymphocytic colitis: Secondary | ICD-10-CM

## 2023-08-29 NOTE — Progress Notes (Signed)
GI Office Note    Referring Provider: [facility]* Primary Care Physician:  [facility] Primary Gastroenterologist: Gerrit Friends.Rourk, MD   Date:  08/29/2023  ID:  GERALDENE EISEL, DOB 1941-07-04, MRN [id number]   Chief Complaint   Chief Complaint  Patient presents with   Follow-up    Doesn't feel like her bowels are emptying well enough   History of Present Illness  Marie [name] is a 83 y.o. female with a history of anemia, depression, fibromyalgia, GERD, hiatal hernia, HTN, lymphocytic colitis with colonoscopy in 2007 and 2013 as well as dysphagia s/p dilation 2008 presenting today with complaint of constipation/incomplete emptying.   Last colonoscopy 01/09/2017 -diverticulosis noted in the sigmoid colon, segmental biopsies taken, exam otherwise normal.  Biopsies confirmed lymphocytic colitis, was advised Entocort (budesonide) and a referral to tertiary center.   Appointment with [facility] digestive health services on 03/14/2017 -patient reported ongoing diarrhea (chronically ongoing for 5 years), failed prednisone course for lymphocytic colitis, was on Entocort which improved her symptoms for about 3 weeks before coming back, Pepto-Bismol and Imodium without much effect, and left lower quadrant abdominal pain that improves with passage of stool.  Of note she was also having fecal incontinence 2-3 times a week.  She was encouraged to increase her fiber intake with a goal of 30 to 40 g of fiber daily and was advised to slowly increase her fiber each week, take 2 tablets of Imodium 4 times daily, and continue budesonide.  Does not appear that she followed up with them after this.  I last saw patient in April 2023 for diarrhea in the evenings and early mornings and feelings of weakness.  Last office visit 05/28/23 with Dr. Jena Gauss.Per this note patient is CTN September 2024 which demonstrated no evidence of diverticulitis but with small umbilical hernia and small  right spigelian hernia containing a small amount of small bowel without incarceration or obstruction.  She had complaints of abdominal bloating and actually felt like she was more constipated recently.  Nexium controlling her reflux symptoms, will have recurrent symptoms if she misses a dose.  She was advised to continue Nexium and resume Linzess 72 mcg once daily.  She was advised to call for prescription of Linzess if found helpful.  Today:  This past Monday she felt constipated and took a laxative but did not feel empty even after multiple trips to the bathroom. She states yesterday she took a stool softener but does not feel empty. Having some lower left abdominal pain.   Linzess samples worked well for her and she felt emptied and then she had diarrhea and was fearful to continue. Does not believe she has used miralax recently. The LLQ ache gets better after Bms. Currently being treated with Augmentin.   No melena or brbpr. New med has been causing her some increased GERD symptoms, at baseline is well controlled.   She states since October she has been sick multiple times with UTIs and pneumonia, got out of hospital beginning of this month.   Went to urgent care yesterday about her UTI. She said she is to go see her urologist soon in [place], [place].    Wt Readings from Last 3 Encounters:  08/29/23 139 lb 6.4 oz (63.2 kg)  08/14/23 139 lb (63 kg)  08/05/23 136 lb (61.7 kg)    Current Outpatient Medications  Medication Sig Dispense Refill   acetaminophen (TYLENOL) 500 MG tablet Take 2 tablets (1,000 mg  total) by mouth every 8 (eight) hours as needed for mild pain (pain score 1-3), fever or headache.     amoxicillin-clavulanate (AUGMENTIN) 875-125 MG tablet Take 1 tablet by mouth 2 (two) times daily.     Ascorbic Acid (VITAMIN C PO) Take 1 tablet by mouth every morning.     CALCIUM PO Take 1 tablet by mouth every morning.     clonazePAM (KLONOPIN) 1 MG tablet Take 1 tablet (1 mg total) by  mouth 2 (two) times daily as needed for anxiety. 10 tablet 0   diclofenac Sodium (VOLTAREN) 1 % GEL Apply 4 g topically 4 (four) times daily as needed (joint and musculoskeletal pain). Apply to affected areas     esomeprazole (NEXIUM) 40 MG capsule Take 40 mg by mouth daily.      hyoscyamine (LEVSIN) 0.125 MG tablet Take 0.125 mg by mouth every 8 (eight) hours as needed for cramping.     loratadine (CLARITIN) 10 MG tablet Take 1 tablet (10 mg total) by mouth daily. 30 tablet 1   losartan (COZAAR) 50 MG tablet Take 50 mg by mouth daily.     Multiple Vitamins-Minerals (CENTRUM SILVER ULTRA WOMENS PO) Take 1 tablet by mouth every morning.     Omega-3 Fatty Acids (FISH OIL) 1000 MG CAPS Take 2 capsules by mouth daily.     traMADol (ULTRAM) 50 MG tablet Take 1 tablet (50 mg total) by mouth every 12 (twelve) hours as needed for severe pain (pain score 7-10). 15 tablet 0   vitamin B-12 (CYANOCOBALAMIN) 1000 MCG tablet Take 1,000 mcg by mouth daily.     No current facility-administered medications for this visit.    Past Medical History:  Diagnosis Date   Anemia    Collagenous colitis    colonoscopy 2009   Depression    Fibromyalgia    GERD (gastroesophageal reflux disease)    Hiatal hernia 2008   small   Hypercholesterolemia    Hypertension    IBS (irritable bowel syndrome)    Lymphocytic colitis    colonoscopy 2007, TCS 2013   S/P endoscopy Sept 2008   small hh, s/p 61 French dilator    Past Surgical History:  Procedure Laterality Date   ABDOMINAL HYSTERECTOMY     BIOPSY  01/09/2017   Procedure: BIOPSY;  Surgeon: Corbin Ade, MD;  [facility];  Service: Endoscopy;;  colon   CATARACT EXTRACTION Bilateral 2021   COLONOSCOPY  04/13/2008   Dr.Rourk- anal papilla and internal hemorrhage otherwise normal /left side diverticula   COLONOSCOPY  09/20/2011   Dr.Rourk- Adequate preparation. Internal hemorrhoids; otherwise normal/ Left-sided diverticula, biopsies positive for  lymphocytic colitis   COLONOSCOPY N/A 01/09/2017   Procedure: COLONOSCOPY;  Surgeon: Corbin Ade, MD;  [facility];  Service: Endoscopy;  Laterality: N/A;  7:30am   ESOPHAGOGASTRODUODENOSCOPY  04/22/2007   Dr.Rourk- normal esophagus, small hiatal hernia o/w normal stomach, D1 and D2 s/p passage of a 56-French maloney dilator   fistula of colon     FLEXIBLE SIGMOIDOSCOPY  05/29/2006   Dr.Rehman- No evidence of pseudomembranous or acute colitis.   FOOT SURGERY     KNEE SURGERY     NOSE SURGERY     VEIN SURGERY Bilateral 2021    Family History  Problem Relation Age of Onset   Colon cancer Maternal Uncle    Colon cancer Maternal Grandmother     Allergies as of 08/29/2023 - Review Complete 08/29/2023  Allergen Reaction Noted  Codeine Hives and Swelling 12/27/2015   Promethazine hcl Swelling 10/09/2022    Social History   Socioeconomic History   Marital status: Widowed    Spouse name: Not on file   Number of children: Not on file   Years of education: Not on file   Highest education level: Not on file  Occupational History   Not on file  Tobacco Use   Smoking status: Never   Smokeless tobacco: Never  Vaping Use   Vaping status: Never Used  Substance and Sexual Activity   Alcohol use: No   Drug use: No   Sexual activity: Not Currently  Other Topics Concern   Not on file  Social History Narrative   Not on file   Social Drivers of Health   Financial Resource Strain: Not on file  Food Insecurity: No Food Insecurity (07/23/2023)   Hunger Vital Sign    Worried About Running Out of Food in the Last Year: Never true    Ran Out of Food in the Last Year: Never true  Transportation Needs: No Transportation Needs (07/23/2023)   PRAPARE - Administrator, Civil Service (Medical): No    Lack of Transportation (Non-Medical): No  Physical Activity: Not on file  Stress: Not on file  Social Connections: Not on file     Review of Systems   Gen:  Denies fever, chills, anorexia. Denies fatigue, weakness, weight loss.  CV: Denies chest pain, palpitations, syncope, peripheral edema, and claudication. Resp: Denies dyspnea at rest, cough, wheezing, coughing up blood, and pleurisy. GI: See HPI Derm: Denies rash, itching, dry skin Psych: Denies depression, anxiety, memory loss, confusion. No homicidal or suicidal ideation.  Heme: Denies bruising, bleeding, and enlarged lymph nodes.  Physical Exam   BP (!) 112/57 (BP Location: Left Arm, Patient Position: Sitting, Cuff Size: Normal)   Pulse 65   Temp (!) 97.2 F (36.2 C) (Temporal)   Ht 5\' 3"  (1.6 m)   Wt 139 lb 6.4 oz (63.2 kg)   BMI 24.69 kg/m   General:   Alert and oriented. No distress noted. Pleasant and cooperative.  Head:  Normocephalic and atraumatic. Eyes:  Conjuctiva clear without scleral icterus. Mouth:  Oral mucosa pink and moist. Good dentition. No lesions. Abdomen:  +BS, soft, non-distended, non tender  No rebound or guarding. No HSM or masses noted. Rectal: deferred Msk:  Symmetrical without gross deformities. Normal posture. Extremities:  Without edema. Neurologic:  Alert and  oriented x4 Psych:  Alert and cooperative. Normal mood and affect.  Assessment  Marie [name] is a 83 y.o. female with a history of anemia, depression, fibromyalgia, GERD, hiatal hernia, HTN, lymphocytic colitis with colonoscopy in 2007 and 2013 as well as dysphagia s/p dilation 2008 presenting today with complaint of constipation/incomplete emptying.   Constipation: Possible IBS component given abdominal pain related to bowel function that improves after defecation.  Has tried Linzess in the past and reported improved defecation without incomplete emptying however due to some of the diarrhea she was fearful to continue.  Has used stool softener recently which improved some of her bowel function however still having incomplete emptying.  Given her concerns with significant diarrhea with Linzess  despite education about washout period we came to a shared decision for her to try MiraLAX nightly on a consistent basis.  We can increase frequency of MiraLAX if needed or if she begins having more than 4 loose stools per day and we can reduce MiraLAX to every other  day.  We discussed that if she continued to have constipation or abdominal pain we should consider another trial of Linzess and try to get past the initial 1-2-week washout period or trial different agent such as Amitiza or ibsrella.   GERD: Overall generally well-controlled with Nexium 40 mg once daily.  She does report that some of her new medications, likely her antibiotics have caused her some increased reflux symptoms given frequent UTIs.  Advised that she may take an additional dose in the evenings if needed or if quick relief needed she may use Tums.  PLAN    MiraLAX 17 g in 8 ounces of water nightly, increase or decrease as needed. Consider Amitiza vs ibsrella or other agent if pain/constipation persist with miralax.  Continue Nexium 40 mg once daily, may take additional dose in the evening if needed. If symptoms are not severe, recommend Tums for quick relief of reflux symptoms. Recommended follow-up with urology for frequent UTIs. Follow-up in 2 months    Brooke Bonito, MSN, FNP-BC, AGACNP-BC [facility]

## 2023-08-29 NOTE — Patient Instructions (Addendum)
Constipation: Start miralax 17 g in 8 oz water nightly. If you start having more than 4 loose stools per day you can reduce miralax to every other day. If still having constipation or abdominal pain we can consider another trial of Linzess.  Continue Nexium 40 mg once daily - may take additional dose  if needed or take Tums.   Given your frequent UTIs I have would recommend that you call urology and get into see them.  Follow up in 2 months, please do not hesitate to call if you have any confusion about what to do with your MiraLAX.  It was a pleasure to see you today. I want to create trusting relationships with patients. If you receive a survey regarding your visit,  I greatly appreciate you taking time to fill this out on paper or through your MyChart. I value your feedback.  Brooke Bonito, MSN, FNP-BC, AGACNP-BC [facility]

## 2023-10-24 ENCOUNTER — Ambulatory Visit: Payer: Medicare (Managed Care) | Admitting: Gastroenterology

## 2023-10-28 NOTE — Progress Notes (Deleted)
 GI Office Note    Referring Provider: [facility]* Primary Care Physician:  [facility] Primary Gastroenterologist: Gerrit Friends.Rourk, MD  Date:  10/29/2023  ID:  Marie [name], DOB 1941/02/27, MRN [id number]   Chief Complaint   No chief complaint on file.  History of Present Illness  Marie [name] is a 83 y.o. female with a history of *** presenting today with complaint of   colonoscopy 01/09/2017 -diverticulosis noted in the sigmoid colon, segmental biopsies taken, exam otherwise normal.  Biopsies confirmed lymphocytic colitis, was advised Entocort (budesonide) and a referral to tertiary center.   Appointment with [facility] digestive health services on 03/14/2017 -patient reported ongoing diarrhea (chronically ongoing for 5 years), failed prednisone course for lymphocytic colitis, was on Entocort which improved her symptoms for about 3 weeks before coming back, Pepto-Bismol and Imodium without much effect, and left lower quadrant abdominal pain that improves with passage of stool.  Of note she was also having fecal incontinence 2-3 times a week.  She was encouraged to increase her fiber intake with a goal of 30 to 40 g of fiber daily and was advised to slowly increase her fiber each week, take 2 tablets of Imodium 4 times daily, and continue budesonide.  Does not appear that she followed up with them after this.   OV 05/28/23 with Dr. Jena Gauss.Per this note patient is CTN September 2024 which demonstrated no evidence of diverticulitis but with small umbilical hernia and small right spigelian hernia containing a small amount of small bowel without incarceration or obstruction.  She had complaints of abdominal bloating and actually felt like she was more constipated recently.  Nexium controlling her reflux symptoms, will have recurrent symptoms if she misses a dose.  She was advised to continue Nexium and resume Linzess 72 mcg once daily.  She was advised to call for  prescription of Linzess if found helpful.  Last office visit 08/29/23.  Felt constipated so she took a laxative and did not feel it emptied despite multiple trips to the bathroom that she took a stool softener and still did not feel empty.  Having some left lower quadrant pain.  Linzess samples have worked well for her and she felt emptied but given the ongoing diarrhea she was fearful to continue.  Does not believe she has used MiraLAX recently.  Pain does get better after bowel movements.  Currently being treated with Augmentin.  Thinks new medications have been causing increased reflux symptoms.  Has had multiple UTIs and pneumonia for 3-4 months prior. Advised miralax 17g nightly. Consider Amitiza vs ibsrella if ongoing issues with miralax. Continue Nexium daily, extra dose evening as needed. Follow up 2 months.    Today:  GERD -   Constipation -   Wt Readings from Last 3 Encounters:  08/29/23 139 lb 6.4 oz (63.2 kg)  08/14/23 139 lb (63 kg)  08/05/23 136 lb (61.7 kg)    Current Outpatient Medications  Medication Sig Dispense Refill   acetaminophen (TYLENOL) 500 MG tablet Take 2 tablets (1,000 mg total) by mouth every 8 (eight) hours as needed for mild pain (pain score 1-3), fever or headache.     amoxicillin-clavulanate (AUGMENTIN) 875-125 MG tablet Take 1 tablet by mouth 2 (two) times daily.     Ascorbic Acid (VITAMIN C PO) Take 1 tablet by mouth every morning.     CALCIUM PO Take 1 tablet by mouth every morning.     clonazePAM (KLONOPIN) 1  MG tablet Take 1 tablet (1 mg total) by mouth 2 (two) times daily as needed for anxiety. 10 tablet 0   diclofenac Sodium (VOLTAREN) 1 % GEL Apply 4 g topically 4 (four) times daily as needed (joint and musculoskeletal pain). Apply to affected areas     esomeprazole (NEXIUM) 40 MG capsule Take 40 mg by mouth daily.      hyoscyamine (LEVSIN) 0.125 MG tablet Take 0.125 mg by mouth every 8 (eight) hours as needed for cramping.     loratadine (CLARITIN)  10 MG tablet Take 1 tablet (10 mg total) by mouth daily. 30 tablet 1   losartan (COZAAR) 50 MG tablet Take 50 mg by mouth daily.     Multiple Vitamins-Minerals (CENTRUM SILVER ULTRA WOMENS PO) Take 1 tablet by mouth every morning.     Omega-3 Fatty Acids (FISH OIL) 1000 MG CAPS Take 2 capsules by mouth daily.     traMADol (ULTRAM) 50 MG tablet Take 1 tablet (50 mg total) by mouth every 12 (twelve) hours as needed for severe pain (pain score 7-10). 15 tablet 0   vitamin B-12 (CYANOCOBALAMIN) 1000 MCG tablet Take 1,000 mcg by mouth daily.     No current facility-administered medications for this visit.    Past Medical History:  Diagnosis Date   Anemia    Collagenous colitis    colonoscopy 2009   Depression    Fibromyalgia    GERD (gastroesophageal reflux disease)    Hiatal hernia 2008   small   Hypercholesterolemia    Hypertension    IBS (irritable bowel syndrome)    Lymphocytic colitis    colonoscopy 2007, TCS 2013   S/P endoscopy Sept 2008   small hh, s/p 52 French dilator    Past Surgical History:  Procedure Laterality Date   ABDOMINAL HYSTERECTOMY     BIOPSY  01/09/2017   Procedure: BIOPSY;  Surgeon: Corbin Ade, MD;  [facility];  Service: Endoscopy;;  colon   CATARACT EXTRACTION Bilateral 2021   COLONOSCOPY  04/13/2008   Dr.Rourk- anal papilla and internal hemorrhage otherwise normal /left side diverticula   COLONOSCOPY  09/20/2011   Dr.Rourk- Adequate preparation. Internal hemorrhoids; otherwise normal/ Left-sided diverticula, biopsies positive for lymphocytic colitis   COLONOSCOPY N/A 01/09/2017   Procedure: COLONOSCOPY;  Surgeon: Corbin Ade, MD;  Location: [facility];  Service: Endoscopy;  Laterality: N/A;  7:30am   ESOPHAGOGASTRODUODENOSCOPY  04/22/2007   Dr.Rourk- normal esophagus, small hiatal hernia o/w normal stomach, D1 and D2 s/p passage of a 56-French maloney dilator   fistula of colon     FLEXIBLE SIGMOIDOSCOPY  05/29/2006   Dr.Rehman- No  evidence of pseudomembranous or acute colitis.   FOOT SURGERY     KNEE SURGERY     NOSE SURGERY     VEIN SURGERY Bilateral 2021    Family History  Problem Relation Age of Onset   Colon cancer Maternal Uncle    Colon cancer Maternal Grandmother     Allergies as of 10/30/2023 - Review Complete 08/29/2023  Allergen Reaction Noted   Codeine Hives and Swelling 12/27/2015   Promethazine hcl Swelling 10/09/2022    Social History   Socioeconomic History   Marital status: Widowed    Spouse name: Not on file   Number of children: Not on file   Years of education: Not on file   Highest education level: Not on file  Occupational History   Not on file  Tobacco Use   Smoking status:  Never   Smokeless tobacco: Never  Vaping Use   Vaping status: Never Used  Substance and Sexual Activity   Alcohol use: No   Drug use: No   Sexual activity: Not Currently  Other Topics Concern   Not on file  Social History Narrative   Not on file   Social Drivers of Health   Financial Resource Strain: Not on file  Food Insecurity: No Food Insecurity (07/23/2023)   Hunger Vital Sign    Worried About Running Out of Food in the Last Year: Never true    Ran Out of Food in the Last Year: Never true  Transportation Needs: No Transportation Needs (07/23/2023)   PRAPARE - Administrator, Civil Service (Medical): No    Lack of Transportation (Non-Medical): No  Physical Activity: Not on file  Stress: Not on file  Social Connections: Not on file     Review of Systems   Gen: Denies fever, chills, anorexia. Denies fatigue, weakness, weight loss.  CV: Denies chest pain, palpitations, syncope, peripheral edema, and claudication. Resp: Denies dyspnea at rest, cough, wheezing, coughing up blood, and pleurisy. GI: See HPI Derm: Denies rash, itching, dry skin Psych: Denies depression, anxiety, memory loss, confusion. No homicidal or suicidal ideation.  Heme: Denies bruising, bleeding, and  enlarged lymph nodes.  Physical Exam   There were no vitals taken for this visit.  General:   Alert and oriented. No distress noted. Pleasant and cooperative.  Head:  Normocephalic and atraumatic. Eyes:  Conjuctiva clear without scleral icterus. Mouth:  Oral mucosa pink and moist. Good dentition. No lesions. Lungs:  Clear to auscultation bilaterally. No wheezes, rales, or rhonchi. No distress.  Heart:  S1, S2 present without murmurs appreciated.  Abdomen:  +BS, soft, non-tender and non-distended. No rebound or guarding. No HSM or masses noted. Rectal: *** Msk:  Symmetrical without gross deformities. Normal posture. Extremities:  Without edema. Neurologic:  Alert and  oriented x4 Psych:  Alert and cooperative. Normal mood and affect.  Assessment  Marie [name] is a 83 y.o. female with a history of *** presenting today with   Constipation:  GERD:  PLAN   ***     Brooke Bonito, MSN, FNP-BC, AGACNP-BC [facility]

## 2023-10-29 ENCOUNTER — Ambulatory Visit (INDEPENDENT_AMBULATORY_CARE_PROVIDER_SITE_OTHER): Admitting: Orthopedic Surgery

## 2023-10-29 ENCOUNTER — Other Ambulatory Visit (INDEPENDENT_AMBULATORY_CARE_PROVIDER_SITE_OTHER): Payer: Self-pay

## 2023-10-29 VITALS — BP 137/85 | HR 78 | Ht 63.0 in | Wt 144.0 lb

## 2023-10-29 DIAGNOSIS — M12812 Other specific arthropathies, not elsewhere classified, left shoulder: Secondary | ICD-10-CM | POA: Diagnosis not present

## 2023-10-29 DIAGNOSIS — M545 Low back pain, unspecified: Secondary | ICD-10-CM

## 2023-10-29 DIAGNOSIS — M25512 Pain in left shoulder: Secondary | ICD-10-CM

## 2023-10-30 ENCOUNTER — Ambulatory Visit: Payer: Medicare (Managed Care) | Admitting: Gastroenterology

## 2023-10-30 ENCOUNTER — Encounter: Payer: Self-pay | Admitting: Orthopedic Surgery

## 2023-10-30 ENCOUNTER — Encounter: Payer: Self-pay | Admitting: Gastroenterology

## 2023-10-30 NOTE — Progress Notes (Signed)
 Orthopaedic Clinic Return  Assessment: Marie [name] is a 83 y.o. female with the following: Left shoulder rotator cuff arthropathy Lumbar spondylosis   Plan: Marie [name] has left shoulder rotator cuff arthropathy.  She is denying pain at this time, but has limited range of motion.  We discussed the possibility of proceeding with an injection, but she is not having that much pain.  I explained the pathology, and why she has restricted motion.  She states her understanding.  If her pain gets worse, she can consider an injection.  Regards to her lower back, she has advanced degenerative changes, which is contributing to her axial back pain.  She has no radiating symptoms at this time.  She can consider therapy.  Otherwise, nothing else to consider.  Follow-up as needed.  Follow-up: Return if symptoms worsen or fail to improve.   Subjective:  Chief Complaint  Patient presents with   Back Pain    L LBP staying in the lower back for   Shoulder Pain    L no ROM     History of Present Illness: Marie [name] is a 83 y.o. female who returns to clinic for evaluation of low back pain, and left shoulder pain.  I previously seen her for right shoulder pain, and has done well following an injection.  She notes restricted range of motion of the left shoulder.  Her pain is not that severe.  In addition, she is having back pain.  She has had back pain for years.  No radiating pains.  Pain is primarily in her lower back.  She uses a walker to assist with ambulation.  Review of Systems: No fevers or chills No numbness or tingling No chest pain No shortness of breath No bowel or bladder dysfunction No GI distress No headaches   Objective: BP 137/85   Pulse 78   Ht 5\' 3"  (1.6 m)   Wt 144 lb (65.3 kg)   BMI 25.51 kg/m   Physical Exam:  Alert and oriented.  No acute distress.  Ambulates with a walker.  Restricted motion of the left shoulder.  Sensation intact in the axillary nerve  distribution.  Tenderness palpation the lower back.  Negative straight leg raise bilaterally.  Strength is equal in bilateral lower extremities.  IMAGING: I personally ordered and reviewed the following images:  X-rays of the left shoulder were obtained in clinic today.  No acute injuries noted.  Proximal humeral migration.  Complete loss of subacromial space.  The humerus is abutting the undersurface of the acromion.  Impression: Left shoulder x-rays with proximal humeral migration, consistent with chronic rotator cuff injury   X-rays of the lumbar spine were obtained in clinic today.  No acute injuries noted.  Degenerative thoracolumbar scoliosis.  No anterolisthesis.  Advanced degenerative changes, with loss of disc height in the lower lumbar spine at multiple levels.  There are some associated osteophytes.  No bony lesions.  Impression: Lumbar spine x-rays with thoracolumbar scoliosis, and lower lumbar degenerative changes   Oliver Barre, MD 10/30/2023 9:34 AM

## 2023-12-24 ENCOUNTER — Ambulatory Visit (INDEPENDENT_AMBULATORY_CARE_PROVIDER_SITE_OTHER): Admitting: Internal Medicine

## 2023-12-24 ENCOUNTER — Encounter: Payer: Self-pay | Admitting: Internal Medicine

## 2023-12-24 VITALS — BP 125/62 | HR 70 | Temp 97.9°F | Ht 64.0 in | Wt 135.0 lb

## 2023-12-24 DIAGNOSIS — K5909 Other constipation: Secondary | ICD-10-CM | POA: Diagnosis not present

## 2023-12-24 DIAGNOSIS — K219 Gastro-esophageal reflux disease without esophagitis: Secondary | ICD-10-CM

## 2023-12-24 NOTE — Patient Instructions (Addendum)
 It was good to see you again!  Since you tell me Linzess really helped taking it for 5 days, we will continue with that prescription  In addition to taking MiraLAX  17 g orally at bedtime, take Linzess 72 gelcap 1 daily (dispense 30 with 11 refills)  For better control your reflux symptoms take esomeprazole  or Nexium  40 mg twice daily 30 minutes before breakfast and supper (dispense 60 with 11 refills)  Office visit here in 3 months

## 2023-12-24 NOTE — Progress Notes (Signed)
 Primary Care Physician:  [facility] Primary Gastroenterologist:  Dr. Riley Cheadle  Pre-Procedure History & Physical: HPI:  Marie [name] is a 83 y.o. female here for follow-up of GERD and constipation.  Apparently took Miralax  and Linzess 72 previously took Linzess for 5 days bowels were moving well and then she did not have any more to take and did not call us .  Having difficulties having a bowel movement with MiraLAX  once daily.  No rectal bleeding.  Occasional breakthrough 2-3 times a week taking Nexium  40 mg once daily.  Takes a second dose with better control no dysphagia.  Weight is stable compared to her last visit.  Past Medical History:  Diagnosis Date   Anemia    Collagenous colitis    colonoscopy 2009   Depression    Fibromyalgia    GERD (gastroesophageal reflux disease)    Hiatal hernia 2008   small   Hypercholesterolemia    Hypertension    IBS (irritable bowel syndrome)    Lymphocytic colitis    colonoscopy 2007, TCS 2013   S/P endoscopy Sept 2008   small hh, s/p 33 French dilator    Past Surgical History:  Procedure Laterality Date   ABDOMINAL HYSTERECTOMY     BIOPSY  01/09/2017   Procedure: BIOPSY;  Surgeon: Suzette Espy, MD;  [facility];  Service: Endoscopy;;  colon   CATARACT EXTRACTION Bilateral 2021   COLONOSCOPY  04/13/2008   Dr.Breelyn Icard- anal papilla and internal hemorrhage otherwise normal /left side diverticula   COLONOSCOPY  09/20/2011   Dr.Cristabel Bicknell- Adequate preparation. Internal hemorrhoids; otherwise normal/ Left-sided diverticula, biopsies positive for lymphocytic colitis   COLONOSCOPY N/A 01/09/2017   Procedure: COLONOSCOPY;  Surgeon: Suzette Espy, MD;  Location: [facility];  Service: Endoscopy;  Laterality: N/A;  7:30am   ESOPHAGOGASTRODUODENOSCOPY  04/22/2007   Dr.Tammie Yanda- normal esophagus, small hiatal hernia o/w normal stomach, D1 and D2 s/p passage of a 56-French maloney dilator   fistula of colon     FLEXIBLE  SIGMOIDOSCOPY  05/29/2006   Dr.Rehman- No evidence of pseudomembranous or acute colitis.   FOOT SURGERY     KNEE SURGERY     NOSE SURGERY     VEIN SURGERY Bilateral 2021    Prior to Admission medications   Medication Sig Start Date End Date Taking? Authorizing Provider  acetaminophen  (TYLENOL ) 500 MG tablet Take 2 tablets (1,000 mg total) by mouth every 8 (eight) hours as needed for mild pain (pain score 1-3), fever or headache. 07/25/23  Yes Justina Oman, MD  amoxicillin-clavulanate (AUGMENTIN) 875-125 MG tablet Take 1 tablet by mouth 2 (two) times daily. 08/28/23  Yes [provider]  Ascorbic Acid (VITAMIN C PO) Take 1 tablet by mouth every morning.   Yes [provider]  CALCIUM PO Take 1 tablet by mouth every morning.   Yes [provider]  citalopram  (CELEXA ) 40 MG tablet Take 40 mg by mouth daily. 12/02/23  Yes [provider]  clonazePAM  (KLONOPIN ) 1 MG tablet Take 1 tablet (1 mg total) by mouth 2 (two) times daily as needed for anxiety. 07/25/23  Yes Justina Oman, MD  diclofenac  Sodium (VOLTAREN ) 1 % GEL Apply 4 g topically 4 (four) times daily as needed (joint and musculoskeletal pain). Apply to affected areas 05/07/23  Yes Johnson, Clanford L, MD  esomeprazole  (NEXIUM ) 40 MG capsule Take 40 mg by mouth daily.    Yes [provider]  hydrochlorothiazide (HYDRODIURIL) 12.5 MG  tablet Take 12.5 mg by mouth daily. 10/14/23  Yes [provider]  hyoscyamine  (LEVSIN) 0.125 MG tablet Take 0.125 mg by mouth every 8 (eight) hours as needed for cramping. 05/22/23  Yes [provider]  loratadine  (CLARITIN ) 10 MG tablet Take 1 tablet (10 mg total) by mouth daily. 07/26/23  Yes Justina Oman, MD  losartan  (COZAAR ) 50 MG tablet Take 50 mg by mouth daily.   Yes [provider]  Multiple Vitamins-Minerals (CENTRUM SILVER ULTRA WOMENS PO) Take 1 tablet by mouth every morning.   Yes [provider]  Omega-3 Fatty Acids  (FISH OIL) 1000 MG CAPS Take 2 capsules by mouth daily.   Yes [provider]  traMADol  (ULTRAM ) 50 MG tablet Take 1 tablet (50 mg total) by mouth every 12 (twelve) hours as needed for severe pain (pain score 7-10). 07/25/23  Yes Justina Oman, MD  vitamin B-12 (CYANOCOBALAMIN ) 1000 MCG tablet Take 1,000 mcg by mouth daily.   Yes [provider]    Allergies as of 12/24/2023 - Review Complete 12/24/2023  Allergen Reaction Noted   Codeine Hives and Swelling 12/27/2015   Promethazine hcl Swelling 10/09/2022    Family History  Problem Relation Age of Onset   Colon cancer Maternal Uncle    Colon cancer Maternal Grandmother     Social History   Socioeconomic History   Marital status: Widowed    Spouse name: Not on file   Number of children: Not on file   Years of education: Not on file   Highest education level: Not on file  Occupational History   Not on file  Tobacco Use   Smoking status: Never   Smokeless tobacco: Never  Vaping Use   Vaping status: Never Used  Substance and Sexual Activity   Alcohol use: No   Drug use: No   Sexual activity: Not Currently  Other Topics Concern   Not on file  Social History Narrative   Not on file   Social Drivers of Health   Financial Resource Strain: Not on file  Food Insecurity: Low Risk  (11/12/2023)   Received from [facility]   Hunger Vital Sign    Worried About Running Out of Food in the Last Year: Never true    Ran Out of Food in the Last Year: Never true  Transportation Needs: No Transportation Needs (11/12/2023)   Received from [employer]    In the past 12 months, has lack of reliable transportation kept you from medical appointments, meetings, work or from getting things needed for daily living? : No  Physical Activity: Not on file  Stress: Not on file  Social Connections: Not on file  Intimate Partner Violence: Patient Unable To Answer (07/23/2023)   Humiliation, Afraid, Rape, and  Kick questionnaire    Fear of Current or Ex-Partner: Patient unable to answer    Emotionally Abused: Patient unable to answer    Physically Abused: Patient unable to answer    Sexually Abused: Patient unable to answer    Review of Systems: See HPI, otherwise negative ROS  Physical Exam: BP 125/62 (BP Location: Right Arm, Patient Position: Sitting, Cuff Size: Normal)   Pulse 70   Temp 97.9 F (36.6 C) (Oral)   Ht 5\' 4"  (1.626 m)   Wt 135 lb (61.2 kg)   SpO2 98%   BMI 23.17 kg/m  General:   Alert,  Well-developed, well-nourished, pleasant and cooperative in NAD Abdomen: Non-distended, normal bowel sounds.  Soft  and nontender without appreciable mass or hepatosplenomegaly.   Impression/Plan: Chronic constipation in this pleasant 82 year old lady MiraLAX  and Linzess working well when she took both together.  She needs to be on this is a chronic everyday regimen.  This was reviewed with the patient  GERD with occasional breakthrough symptoms on esomeprazole  once daily.  She needs escalation in therapy  Recommendations:  MiraLAX  17 g orally at bedtime, take Linzess 72 gelcap 1 daily (dispense 30 with 11 refills).  esomeprazole  or Nexium  40 mg twice daily 30 minutes before breakfast and supper (dispense 60 with 11 refills)   Office visit here in 3 months    Notice: This dictation was prepared with Dragon dictation along with smaller phrase technology. Any transcriptional errors that result from this process are unintentional and may not be corrected upon review.

## 2023-12-25 ENCOUNTER — Other Ambulatory Visit: Payer: Self-pay

## 2023-12-25 MED ORDER — LINACLOTIDE 72 MCG PO CAPS: 72.0000 ug | ORAL_CAPSULE | Freq: Every day | ORAL | 11 refills | Status: DC

## 2023-12-25 MED ORDER — ESOMEPRAZOLE MAGNESIUM 40 MG PO CPDR: 40.0000 mg | DELAYED_RELEASE_CAPSULE | Freq: Two times a day (BID) | ORAL | 11 refills | Status: AC

## 2024-01-13 ENCOUNTER — Emergency Department (HOSPITAL_COMMUNITY)

## 2024-01-13 ENCOUNTER — Inpatient Hospital Stay (HOSPITAL_COMMUNITY)
Admission: EM | Admit: 2024-01-13 | Discharge: 2024-01-15 | DRG: 195 | Disposition: A | Discharge status: Skilled Nursing Facility | Attending: Internal Medicine | Admitting: Internal Medicine

## 2024-01-13 ENCOUNTER — Encounter (HOSPITAL_COMMUNITY): Payer: Self-pay | Admitting: Emergency Medicine

## 2024-01-13 ENCOUNTER — Other Ambulatory Visit: Payer: Self-pay

## 2024-01-13 DIAGNOSIS — I129 Hypertensive chronic kidney disease with stage 1 through stage 4 chronic kidney disease, or unspecified chronic kidney disease: Secondary | ICD-10-CM | POA: Diagnosis present

## 2024-01-13 DIAGNOSIS — G8929 Other chronic pain: Secondary | ICD-10-CM | POA: Diagnosis present

## 2024-01-13 DIAGNOSIS — I700 Atherosclerosis of aorta: Secondary | ICD-10-CM | POA: Diagnosis present

## 2024-01-13 DIAGNOSIS — E7800 Pure hypercholesterolemia, unspecified: Secondary | ICD-10-CM | POA: Diagnosis present

## 2024-01-13 DIAGNOSIS — M159 Polyosteoarthritis, unspecified: Secondary | ICD-10-CM | POA: Diagnosis present

## 2024-01-13 DIAGNOSIS — Z888 Allergy status to other drugs, medicaments and biological substances status: Secondary | ICD-10-CM

## 2024-01-13 DIAGNOSIS — M797 Fibromyalgia: Secondary | ICD-10-CM | POA: Diagnosis present

## 2024-01-13 DIAGNOSIS — Z79899 Other long term (current) drug therapy: Secondary | ICD-10-CM

## 2024-01-13 DIAGNOSIS — K439 Ventral hernia without obstruction or gangrene: Secondary | ICD-10-CM | POA: Diagnosis present

## 2024-01-13 DIAGNOSIS — D649 Anemia, unspecified: Secondary | ICD-10-CM | POA: Diagnosis present

## 2024-01-13 DIAGNOSIS — F32A Depression, unspecified: Secondary | ICD-10-CM | POA: Diagnosis present

## 2024-01-13 DIAGNOSIS — R5381 Other malaise: Secondary | ICD-10-CM | POA: Diagnosis present

## 2024-01-13 DIAGNOSIS — J189 Pneumonia, unspecified organism: Secondary | ICD-10-CM | POA: Diagnosis not present

## 2024-01-13 DIAGNOSIS — K449 Diaphragmatic hernia without obstruction or gangrene: Secondary | ICD-10-CM

## 2024-01-13 DIAGNOSIS — R109 Unspecified abdominal pain: Secondary | ICD-10-CM | POA: Diagnosis present

## 2024-01-13 DIAGNOSIS — S42141A Displaced fracture of glenoid cavity of scapula, right shoulder, initial encounter for closed fracture: Secondary | ICD-10-CM

## 2024-01-13 DIAGNOSIS — N1831 Chronic kidney disease, stage 3a: Secondary | ICD-10-CM | POA: Diagnosis present

## 2024-01-13 DIAGNOSIS — F419 Anxiety disorder, unspecified: Secondary | ICD-10-CM | POA: Diagnosis present

## 2024-01-13 DIAGNOSIS — Z87440 Personal history of urinary (tract) infections: Secondary | ICD-10-CM

## 2024-01-13 DIAGNOSIS — F418 Other specified anxiety disorders: Secondary | ICD-10-CM | POA: Diagnosis present

## 2024-01-13 DIAGNOSIS — Z9181 History of falling: Secondary | ICD-10-CM

## 2024-01-13 DIAGNOSIS — S42144A Nondisplaced fracture of glenoid cavity of scapula, right shoulder, initial encounter for closed fracture: Secondary | ICD-10-CM | POA: Diagnosis present

## 2024-01-13 DIAGNOSIS — N760 Acute vaginitis: Secondary | ICD-10-CM

## 2024-01-13 DIAGNOSIS — I10 Essential (primary) hypertension: Secondary | ICD-10-CM | POA: Diagnosis present

## 2024-01-13 DIAGNOSIS — Z885 Allergy status to narcotic agent status: Secondary | ICD-10-CM

## 2024-01-13 DIAGNOSIS — R296 Repeated falls: Secondary | ICD-10-CM | POA: Diagnosis present

## 2024-01-13 DIAGNOSIS — Z90710 Acquired absence of both cervix and uterus: Secondary | ICD-10-CM

## 2024-01-13 DIAGNOSIS — Z800 Family history of malignant neoplasm of digestive organs: Secondary | ICD-10-CM

## 2024-01-13 DIAGNOSIS — K219 Gastro-esophageal reflux disease without esophagitis: Secondary | ICD-10-CM | POA: Diagnosis present

## 2024-01-13 LAB — COMPREHENSIVE METABOLIC PANEL WITH GFR
ALT: 19 U/L (ref 0–44)
AST: 21 U/L (ref 15–41)
Albumin: 2.5 g/dL — ABNORMAL LOW (ref 3.5–5.0)
Alkaline Phosphatase: 59 U/L (ref 38–126)
Anion gap: 10 (ref 5–15)
BUN: 26 mg/dL — ABNORMAL HIGH (ref 8–23)
CO2: 23 mmol/L (ref 22–32)
Calcium: 9.3 mg/dL (ref 8.9–10.3)
Chloride: 102 mmol/L (ref 98–111)
Creatinine, Ser: 1.04 mg/dL — ABNORMAL HIGH (ref 0.44–1.00)
GFR, Estimated: 54 mL/min — ABNORMAL LOW (ref >60)
Glucose, Bld: 141 mg/dL — ABNORMAL HIGH (ref 70–99)
Potassium: 3.4 mmol/L — ABNORMAL LOW (ref 3.5–5.1)
Sodium: 135 mmol/L (ref 135–145)
Total Bilirubin: 0.3 mg/dL (ref 0.0–1.2)
Total Protein: 6.2 g/dL — ABNORMAL LOW (ref 6.5–8.1)

## 2024-01-13 LAB — CBC WITH DIFFERENTIAL/PLATELET
Abs Immature Granulocytes: 0.02 10*3/uL (ref 0.00–0.07)
Basophils Absolute: 0 10*3/uL (ref 0.0–0.1)
Basophils Relative: 1 %
Eosinophils Absolute: 0.5 10*3/uL (ref 0.0–0.5)
Eosinophils Relative: 8 %
HCT: 26.6 % — ABNORMAL LOW (ref 36.0–46.0)
Hemoglobin: 9 g/dL — ABNORMAL LOW (ref 12.0–15.0)
Immature Granulocytes: 0 %
Lymphocytes Relative: 19 %
Lymphs Abs: 1.2 10*3/uL (ref 0.7–4.0)
MCH: 30 pg (ref 26.0–34.0)
MCHC: 33.8 g/dL (ref 30.0–36.0)
MCV: 88.7 fL (ref 80.0–100.0)
Monocytes Absolute: 0.7 10*3/uL (ref 0.1–1.0)
Monocytes Relative: 11 %
Neutro Abs: 3.9 10*3/uL (ref 1.7–7.7)
Neutrophils Relative %: 61 %
Platelets: 252 10*3/uL (ref 150–400)
RBC: 3 MIL/uL — ABNORMAL LOW (ref 3.87–5.11)
RDW: 13.1 % (ref 11.5–15.5)
WBC: 6.4 10*3/uL (ref 4.0–10.5)
nRBC: 0 % (ref 0.0–0.2)

## 2024-01-13 LAB — TROPONIN I (HIGH SENSITIVITY)
Troponin I (High Sensitivity): 5 ng/L (ref <18)
Troponin I (High Sensitivity): 4 ng/L (ref <18)

## 2024-01-13 MED ORDER — AMOXICILLIN-POT CLAVULANATE 875-125 MG PO TABS: 1.0000 | ORAL_TABLET | Freq: Once | ORAL | Status: DC

## 2024-01-13 MED ORDER — IOHEXOL 350 MG/ML SOLN
100.0000 mL | Freq: Once | INTRAVENOUS | Status: AC | PRN
  Administered 2024-01-13: 100 mL via INTRAVENOUS

## 2024-01-13 MED ORDER — AMOXICILLIN-POT CLAVULANATE 875-125 MG PO TABS: 1.0000 | ORAL_TABLET | Freq: Two times a day (BID) | ORAL | 0 refills | Status: DC

## 2024-01-13 NOTE — ED Provider Notes (Signed)
 [place] EMERGENCY DEPARTMENT AT [facility] Provider Note   CSN: [id number] Arrival date & time: 01/13/24  1909     History  Chief Complaint  Patient presents with   L rib pain   L shoulder pain    Marie [name] is a 83 y.o. female with hypertension, hyperlipidemia, mood disorder, arthritis, chronic pain, history collagenous colitis, IBS, recurrent UT  who presents with L flank, L chest, and L shoulder pain. No inciting falls/injury. Accompanied by son at bedside who reports that it has been going on for a couple of days but was so bad that she couldn't get in and out of the car earlier this afternoon. She was seen at [facility] in [place] at their ED earlier today but came here afterward. Has h/o recurrent UTIs and currently on abx for it. Pt states she hasn't been able to eat or drink per usual. Does endorse productive cough and exertional dyspnea for approx 1 week. Denies f/c, N/V/D, urinary sxs, vaginal sxs, hematochezia/melena,   Past Medical History:  Diagnosis Date   Anemia    Collagenous colitis    colonoscopy 2009   Depression    Fibromyalgia    GERD (gastroesophageal reflux disease)    Hiatal hernia 2008   small   Hypercholesterolemia    Hypertension    IBS (irritable bowel syndrome)    Lymphocytic colitis    colonoscopy 2007, TCS 2013   S/P endoscopy Sept 2008   small hh, s/p 89 French dilator       Home Medications Prior to Admission medications   Medication Sig Start Date End Date Taking? Authorizing Provider  acetaminophen  (TYLENOL ) 500 MG tablet Take 2 tablets (1,000 mg total) by mouth every 8 (eight) hours as needed for mild pain (pain score 1-3), fever or headache. 07/25/23  Yes Justina Oman, MD  Ascorbic Acid (VITAMIN C PO) Take 1 tablet by mouth every morning.   Yes [provider]  CALCIUM PO Take 1 tablet by mouth every morning.   Yes [provider]  citalopram  (CELEXA ) 40 MG tablet Take 40 mg by mouth daily.  12/02/23  Yes [provider]  diclofenac  Sodium (VOLTAREN ) 1 % GEL Apply 4 g topically 4 (four) times daily as needed (joint and musculoskeletal pain). Apply to affected areas 05/07/23  Yes Johnson, Clanford L, MD  esomeprazole  (NEXIUM ) 40 MG capsule Take 1 capsule (40 mg total) by mouth 2 (two) times daily before a meal. 12/25/23  Yes Rourk, Windsor Hatcher, MD  hydrochlorothiazide  (HYDRODIURIL ) 12.5 MG tablet Take 12.5 mg by mouth daily. 10/14/23  Yes [provider]  hyoscyamine  (LEVSIN ) 0.125 MG tablet Take 0.125 mg by mouth every 8 (eight) hours as needed for cramping. 05/22/23  Yes [provider]  linaclotide  (LINZESS ) 72 MCG capsule Take 1 capsule (72 mcg total) by mouth daily before breakfast. 12/25/23  Yes Rourk, Windsor Hatcher, MD  loratadine  (CLARITIN ) 10 MG tablet Take 1 tablet (10 mg total) by mouth daily. 07/26/23  Yes Justina Oman, MD  losartan  (COZAAR ) 50 MG tablet Take 50 mg by mouth daily.   Yes [provider]  Multiple Vitamins-Minerals (CENTRUM SILVER ULTRA WOMENS PO) Take 1 tablet by mouth every morning.   Yes [provider]  nitrofurantoin (MACRODANTIN) 100 MG capsule Take 100 mg by mouth at bedtime. 01/09/24 01/19/24 Yes [provider]  Omega-3 Fatty Acids (FISH OIL) 1000 MG CAPS Take 2 capsules by mouth daily.   Yes [provider]  vitamin B-12 (CYANOCOBALAMIN ) 1000 MCG tablet Take 1,000 mcg by mouth daily.   Yes [provider]  amoxicillin -clavulanate (AUGMENTIN ) 875-125 MG tablet Take 1 tablet by mouth every 12 (twelve) hours for 4 days. 01/15/24 01/19/24  Mason Sole, Pratik D, DO  clonazePAM  (KLONOPIN ) 1 MG tablet Take 1 tablet (1 mg total) by mouth 2 (two) times daily as needed for anxiety. 01/15/24   Mason Sole, Pratik D, DO  traMADol  (ULTRAM ) 50 MG tablet Take 1 tablet (50 mg total) by mouth every 12 (twelve) hours as needed for severe pain (pain score 7-10). 01/15/24   Mason Sole, Pratik D, DO      Allergies    Codeine and  Promethazine hcl    Review of Systems   Review of Systems A 10 point review of systems was performed and is negative unless otherwise reported in HPI.  Physical Exam Updated Vital Signs BP (!) 140/67 (BP Location: Left Arm)   Pulse 66   Temp 98.1 F (36.7 C) (Oral)   Resp 18   Ht 5' 4 (1.626 m)   Wt 60 kg   SpO2 96%   BMI 22.71 kg/m  Physical Exam General: Frail-appearing elderly female, lying in bed.  HEENT: PERRLA, Sclera anicteric, MMM, trachea midline.  Cardiology: RRR, no murmurs/rubs/gallops.  Resp: Normal respiratory rate and effort. CTAB, no wheezes, rhonchi, crackles.  Abd: Soft, +mild L-sided abd TTP, non-distended. No rebound tenderness or guarding.  GU: Performed with RN chaperone.  Unremarkable appearing external genitalia.  Pain with speculum insertion.  Vaginal walls seem mildly edematous but there is no significant discharge in the vaginal vault.  Difficult to open speculum due to patient participation.  Of what I could visualize inside the vagina no significant abnormalities. MSK: No peripheral edema or signs of trauma. Extremities without deformity or TTP. No cyanosis or clubbing. Skin: warm, dry. No rashes or lesions. Back: +L CVA tenderness. -R CVA TTP. No midline C, T or L spine Ttp deformities or stepoffs.  Neuro: A&Ox4, CNs II-XII grossly intact. MAEs. Sensation grossly intact.  Psych: Very anxious  ED Results / Procedures / Treatments   Labs (all labs ordered are listed, but only abnormal results are displayed) Labs Reviewed  CBC WITH DIFFERENTIAL/PLATELET - Abnormal; Notable for the following components:      Result Value   RBC 3.00 (*)    Hemoglobin 9.0 (*)    HCT 26.6 (*)    All other components within normal limits  COMPREHENSIVE METABOLIC PANEL WITH GFR - Abnormal; Notable for the following components:   Potassium 3.4 (*)    Glucose, Bld 141 (*)    BUN 26 (*)    Creatinine, Ser 1.04 (*)    Total Protein 6.2 (*)    Albumin 2.5 (*)    GFR,  Estimated 54 (*)    All other components within normal limits  URINALYSIS, ROUTINE W REFLEX MICROSCOPIC - Abnormal; Notable for the following components:   Color, Urine STRAW (*)    Leukocytes,Ua TRACE (*)    All other components within normal limits  BRAIN NATRIURETIC PEPTIDE - Abnormal; Notable for the following components:   B Natriuretic Peptide 148.0 (*)    All other components within normal limits  BASIC METABOLIC PANEL WITH GFR - Abnormal; Notable for the following components:   Potassium 3.4 (*)    Glucose, Bld 111 (*)    All other components within normal limits  MAGNESIUM  - Abnormal; Notable for the following components:   Magnesium  1.5 (*)  All other components within normal limits  CBC - Abnormal; Notable for the following components:   RBC 3.09 (*)    Hemoglobin 8.9 (*)    HCT 27.0 (*)    All other components within normal limits  BASIC METABOLIC PANEL WITH GFR - Abnormal; Notable for the following components:   Glucose, Bld 106 (*)    All other components within normal limits  CBC - Abnormal; Notable for the following components:   RBC 3.25 (*)    Hemoglobin 9.2 (*)    HCT 29.0 (*)    All other components within normal limits  WET PREP, GENITAL  MRSA NEXT GEN BY PCR, NASAL  PROCALCITONIN  STREP PNEUMONIAE URINARY ANTIGEN  LEGIONELLA PNEUMOPHILA SEROGP 1 UR AG  MAGNESIUM   GC/CHLAMYDIA PROBE AMP ([place]) NOT AT [facility]  TROPONIN I (HIGH SENSITIVITY)  TROPONIN I (HIGH SENSITIVITY)    EKG EKG Interpretation Date/Time:  Monday January 13 2024 20:24:38 EDT Ventricular Rate:  66 PR Interval:  129 QRS Duration:  92 QT Interval:  414 QTC Calculation: 434 R Axis:   51  Text Interpretation: Sinus rhythm Minimal ST depression, diffuse leads Confirmed by Annita Kindle [phone]) on 01/13/2024 8:45:43 PM  Radiology CT C/A/P: 1. No evidence of acute aortic syndrome. 2. Patchy ground-glass opacities in the upper lobes with diffuse interlobular septal thickening may  be due to atypical infection or edema. 3. Age indeterminate nondisplaced fracture of the right glenoid. 4. Moderate hiatal hernia with debris in the esophagus reaching near the thoracic inlet. 5. Fluid in the vagina with vaginal wall thickening and hyperenhancement. Correlate for vaginitis. 6. 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 7. Aortic Atherosclerosis (ICD10-I70.0).  Procedures .Pelvic exam  Date/Time: 01/18/2024 9:07 AM  Performed by: Merdis Stalling, MD Authorized by: Merdis Stalling, MD  Consent: Verbal consent obtained. Written consent obtained Risks and benefits: risks, benefits and alternatives were discussed Consent given by: patient Patient identity confirmed: verbally with patient Time out: Immediately prior to procedure a time out was called to verify the correct patient, procedure, equipment, support staff and site/side marked as required. Local anesthesia used: no  Anesthesia: Local anesthesia used: no  Sedation: Patient sedated: no  Comments: Procedure terminated at patient request       Medications Ordered in ED Medications  iohexol  (OMNIPAQUE ) 350 MG/ML injection 100 mL (100 mLs Intravenous Contrast Given 01/13/24 2212)  cefTRIAXone  (ROCEPHIN ) 1 g in sodium chloride  0.9 % 100 mL IVPB (0 g Intravenous Stopped 01/14/24 0241)  azithromycin  (ZITHROMAX ) 500 mg in sodium chloride  0.9 % 250 mL IVPB (0 mg Intravenous Stopped 01/14/24 0347)  potassium chloride  SA (KLOR-CON  M) CR tablet 10 mEq (10 mEq Oral Given 01/14/24 0244)  potassium chloride  SA (KLOR-CON  M) CR tablet 40 mEq (40 mEq Oral Given 01/14/24 0831)  magnesium  sulfate IVPB 2 g 50 mL (2 g Intravenous New Bag/Given 01/14/24 0831)    ED Course/ Medical Decision Making/ A&P                          Medical Decision Making Amount and/or Complexity of Data Reviewed Labs: ordered. Decision-making details documented in ED Course. Radiology: ordered.  Decision-making details documented in ED Course.  Risk Prescription drug management. Decision regarding hospitalization.    This patient presents to the ED for concern of L side pain, this involves an extensive number of treatment options, and is a complaint that carries with it a high risk of  complications and morbidity.  I considered the following differential and admission for this acute, potentially life threatening condition. Patient is voerall mildly hypertensive but otherwise HDS and well-appearing, very anxious.  MDM:    Very broad differential for L shoulder/chest/flank pain. Reassuringly no PTX, AAS, ureterolithiasis, or orthopedic injuries on L shoulder. Patient's CT C/A/P ultimately shows patchy GGOs in upper lobes possibly d/t PNA. She c/o exertional dyspnea as well with no leg swelling, she is SORA, no wheezing or increased WOB. Could be contributing to her pain and she does report increased cough recently, will treat her for PNA. The CT scan also shows nondisplaced age-indeterminate fx of the R glenoid, not the left. She had chronic pain reproted in the R shoulder but again no inciting trauma that she is aware of.  She does follow with orthopedic surgery for joint injections, though typically in the left shoulder, but will put her in a sling and have her follow-up with orthopedic surgery.  CT scan also demonstrates fluid in the vagina and vaginal wall thickening concerning for vaginitis.  Discussed this with the patient who, when asked about vaginal symptoms, reports dysuria and recent UTI, but then on clarification she is unaware if she has had any vaginal symptoms or not.  Performed speculum exam but with significant difficulty due to patient tolerance of the procedure and obtained wet prep/GC swabs.  Patient was noted to have some edema/erythema of the vaginal wall but no significant discharge or other abnormalities in the vaginal vault.  Suspect possible musculoskeletal pain  contributing to patient's symptoms.  EKG is nonischemic and her troponin is negative.  Her other workup is largely reassuring  Clinical Course as of 01/18/24 0901  Mon Jan 13, 2024  2148 Troponin I (High Sensitivity): 5 neg [HN]  2148 Comprehensive metabolic panel(!) Unremarkable in the context of this patient's presentation  [HN]  2148 WBC: 6.4 No leukocytosis  [HN]  2149 Hemoglobin(!): 9.0 Similar to 5 months ago [HN]  2255 CT Angio Chest/Abd/Pel for Dissection W and/or Wo Contrast 1. No evidence of acute aortic syndrome. 2. Patchy ground-glass opacities in the upper lobes with diffuse interlobular septal thickening may be due to atypical infection or edema. 3. Age indeterminate nondisplaced fracture of the right glenoid. 4. Moderate hiatal hernia with debris in the esophagus reaching near the thoracic inlet. 5. Fluid in the vagina with vaginal wall thickening and hyperenhancement. Correlate for vaginitis. 6. 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 7. Aortic Atherosclerosis (ICD10-I70.0).   [HN]  2346 Performed pelvic exam with RN chaperone. Patient could not tolerate much opening of the speculum. From what I could visualize in the vaginal vault there was not significant amount of fluid/discharge, but there was some erythema of the vaginal wall. Swabs were taken.  [HN]    Clinical Course User Index [HN] Merdis Stalling, MD    Labs: I Ordered, and personally interpreted labs.  The pertinent results include: Those listed above  Imaging Studies ordered: I ordered imaging studies including CT chest abdomen pelvis I independently visualized and interpreted imaging. I agree with the radiologist interpretation  Additional history obtained from chart review, son at bedside.    Cardiac Monitoring: The patient was maintained on a cardiac monitor.  I personally viewed and interpreted the cardiac monitored which showed an underlying rhythm  of: NSR  Reevaluation: After the interventions noted above, I reevaluated the patient and found that they have :improved  Social Determinants of Health:  lives independently  Disposition:  Patient is signed out to the oncoming ED physician who is made aware of her history, presentation, exam, workup, and plan.   Co morbidities that complicate the patient evaluation  Past Medical History:  Diagnosis Date   Anemia    Collagenous colitis    colonoscopy 2009   Depression    Fibromyalgia    GERD (gastroesophageal reflux disease)    Hiatal hernia 2008   small   Hypercholesterolemia    Hypertension    IBS (irritable bowel syndrome)    Lymphocytic colitis    colonoscopy 2007, TCS 2013   S/P endoscopy Sept 2008   small hh, s/p 2 French dilator     Medicines Meds ordered this encounter  Medications   iohexol  (OMNIPAQUE ) 350 MG/ML injection 100 mL   DISCONTD: amoxicillin -clavulanate (AUGMENTIN ) 875-125 MG per tablet 1 tablet   DISCONTD: amoxicillin -clavulanate (AUGMENTIN ) 875-125 MG tablet    Sig: Take 1 tablet by mouth every 12 (twelve) hours.    Dispense:  14 tablet    Refill:  0   cefTRIAXone  (ROCEPHIN ) 1 g in sodium chloride  0.9 % 100 mL IVPB    Antibiotic Indication::   CAP   azithromycin  (ZITHROMAX ) 500 mg in sodium chloride  0.9 % 250 mL IVPB    Antibiotic Indication::   CAP   DISCONTD: traMADol  (ULTRAM ) tablet 50 mg   DISCONTD: hydrochlorothiazide  (HYDRODIURIL ) tablet 12.5 mg   DISCONTD: losartan  (COZAAR ) tablet 50 mg   DISCONTD: citalopram  (CELEXA ) tablet 40 mg   DISCONTD: pantoprazole  (PROTONIX ) EC tablet 40 mg   DISCONTD: linaclotide  (LINZESS ) capsule 72 mcg   DISCONTD: enoxaparin  (LOVENOX ) injection 40 mg   DISCONTD: cefTRIAXone  (ROCEPHIN ) 2 g in sodium chloride  0.9 % 100 mL IVPB    Antibiotic Indication::   CAP   DISCONTD: azithromycin  (ZITHROMAX ) tablet 500 mg   DISCONTD: sodium chloride  flush (NS) 0.9 % injection 3 mL   potassium chloride  SA (KLOR-CON  M) CR  tablet 10 mEq   DISCONTD: acetaminophen  (TYLENOL ) tablet 650 mg   DISCONTD: acetaminophen  (TYLENOL ) suppository 650 mg   DISCONTD: senna-docusate (Senokot-S) tablet 1 tablet   DISCONTD: ondansetron  (ZOFRAN ) tablet 4 mg   DISCONTD: ondansetron  (ZOFRAN ) injection 4 mg   potassium chloride  SA (KLOR-CON  M) CR tablet 40 mEq   magnesium  sulfate IVPB 2 g 50 mL   DISCONTD: Chlorhexidine  Gluconate Cloth 2 % PADS 6 each   traMADol  (ULTRAM ) 50 MG tablet    Sig: Take 1 tablet (50 mg total) by mouth every 12 (twelve) hours as needed for severe pain (pain score 7-10).    Dispense:  15 tablet    Refill:  0   amoxicillin -clavulanate (AUGMENTIN ) 875-125 MG tablet    Sig: Take 1 tablet by mouth every 12 (twelve) hours for 4 days.    Dispense:  8 tablet    Refill:  0   clonazePAM  (KLONOPIN ) 1 MG tablet    Sig: Take 1 tablet (1 mg total) by mouth 2 (two) times daily as needed for anxiety.    Dispense:  10 tablet    Refill:  0    I have reviewed the patients home medicines and have made adjustments as needed  Problem List / ED Course: Problem List Items Addressed This Visit       Respiratory   Atypical pneumonia - Primary   Relevant Medications   nitrofurantoin (MACRODANTIN) 100 MG capsule   amoxicillin -clavulanate (AUGMENTIN ) 875-125 MG tablet                This  note was created using dictation software, which may contain spelling or grammatical errors.    Merdis Stalling, MD 01/18/24 [phone]

## 2024-01-13 NOTE — ED Provider Notes (Signed)
 Care of patient assumed from Dr. Drury Geralds.  Presenting for left flank and chest pain.  CT showed possible atypical lung infection.  Awaiting urinalysis, wet prep. Physical Exam  BP (!) 159/66   Pulse 71   Temp 98 F (36.7 C)   Resp 20   Ht 5\' 4"  (1.626 m)   Wt 61 kg   SpO2 97%   BMI 23.08 kg/m   Physical Exam Vitals and nursing note reviewed.  Constitutional:      General: She is not in acute distress.    Appearance: Normal appearance. She is well-developed. She is not ill-appearing, toxic-appearing or diaphoretic.  HENT:     Head: Normocephalic and atraumatic.     Right Ear: External ear normal.     Left Ear: External ear normal.     Nose: Nose normal.     Mouth/Throat:     Mouth: Mucous membranes are moist.  Eyes:     Extraocular Movements: Extraocular movements intact.     Conjunctiva/sclera: Conjunctivae normal.  Cardiovascular:     Rate and Rhythm: Normal rate and regular rhythm.     Heart sounds: No murmur heard. Pulmonary:     Effort: Tachypnea present.     Breath sounds: Normal breath sounds.  Abdominal:     General: There is no distension.     Palpations: Abdomen is soft.     Tenderness: There is no abdominal tenderness.  Musculoskeletal:        General: Tenderness present. No swelling. Normal range of motion.     Cervical back: Normal range of motion and neck supple.  Skin:    General: Skin is warm and dry.     Coloration: Skin is not jaundiced or pale.  Neurological:     General: No focal deficit present.     Mental Status: She is alert and oriented to person, place, and time.  Psychiatric:        Mood and Affect: Mood normal.        Behavior: Behavior normal.     Procedures  Procedures  ED Course / MDM   Clinical Course as of 01/13/24 2348  Mon Jan 13, 2024  2148 Troponin I (High Sensitivity): 5 neg [HN]  2148 Comprehensive metabolic panel(!) Unremarkable in the context of this patient's presentation  [HN]  2148 WBC: 6.4 No leukocytosis   [HN]  2149 Hemoglobin(!): 9.0 Similar to 5 months ago [HN]  2255 CT Angio Chest/Abd/Pel for Dissection W and/or Wo Contrast 1. No evidence of acute aortic syndrome. 2. Patchy ground-glass opacities in the upper lobes with diffuse interlobular septal thickening may be due to atypical infection or edema. 3. Age indeterminate nondisplaced fracture of the right glenoid. 4. Moderate hiatal hernia with debris in the esophagus reaching near the thoracic inlet. 5. Fluid in the vagina with vaginal wall thickening and hyperenhancement. Correlate for vaginitis. 6. 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 7. Aortic Atherosclerosis (ICD10-I70.0).   [HN]  2346 Performed pelvic exam with RN chaperone. Patient could not tolerate much opening of the speculum. From what I could visualize in the vaginal vault there was not significant amount of fluid/discharge, but there was some erythema of the vaginal wall. Swabs were taken.  [HN]    Clinical Course User Index [HN] Merdis Stalling, MD   Medical Decision Making Amount and/or Complexity of Data Reviewed Labs: ordered. Decision-making details documented in ED Course. Radiology: ordered. Decision-making details documented in ED Course.  Risk Prescription drug management. Decision  regarding hospitalization.   Patient's urinalysis and wet prep were negative.  On assessment, patient resting on ED stretcher with son at bedside.  She has mildly increased work of breathing.  She has ongoing pain in her left side of chest and flank.  Pain is worsened with movements and palpation.  I suspect musculoskeletal etiology.  Patient reports that she went to [facility] on Saturday.  She was kept there in the ED overnight.  We discharged her yesterday and patient was unable to get into her home with assistance.  She states that this was secondary to pain as well as exertional shortness of breath.  Will check BNP.  Given CT  findings and her upper lung fields, she may have acute pneumonia.  Antibiotics were ordered.  On trial ambulation, patient was able to maintain normal SpO2 but did have increased work of breathing and exercise intolerance.  Patient was admitted for further management.       Iva Mariner, MD 01/14/24 [phone]

## 2024-01-13 NOTE — ED Triage Notes (Addendum)
 L rib pain. Pt states she "wants to be scanned from L shoulder down to L groin. Pt denies any injury. Pt was just seen at [facility] in [place]. Pt with recurrent UTI's and currently on antibiotics for it. Pt states she hasn't been able to eat or drink per usual.

## 2024-01-13 NOTE — ED Notes (Signed)
 ED Provider at bedside.

## 2024-01-13 NOTE — ED Notes (Signed)
 Patient transported to CT

## 2024-01-13 NOTE — Discharge Instructions (Addendum)
-  Please wear your sling on your right shoulder until you can be seen by an orthopedic doctor. You have a glenoid fracture on the right shoulder. -Please take augmentin twice per day for lung infection. -Please stay well hydrated at home -Please see your gastroenterologist about your hiatal hernia

## 2024-01-14 ENCOUNTER — Encounter (HOSPITAL_COMMUNITY): Payer: Self-pay | Admitting: Family Medicine

## 2024-01-14 DIAGNOSIS — K439 Ventral hernia without obstruction or gangrene: Secondary | ICD-10-CM | POA: Diagnosis present

## 2024-01-14 DIAGNOSIS — E7800 Pure hypercholesterolemia, unspecified: Secondary | ICD-10-CM | POA: Diagnosis present

## 2024-01-14 DIAGNOSIS — R296 Repeated falls: Secondary | ICD-10-CM | POA: Diagnosis present

## 2024-01-14 DIAGNOSIS — S42144A Nondisplaced fracture of glenoid cavity of scapula, right shoulder, initial encounter for closed fracture: Secondary | ICD-10-CM | POA: Diagnosis present

## 2024-01-14 DIAGNOSIS — D649 Anemia, unspecified: Secondary | ICD-10-CM | POA: Diagnosis present

## 2024-01-14 DIAGNOSIS — Z800 Family history of malignant neoplasm of digestive organs: Secondary | ICD-10-CM | POA: Diagnosis not present

## 2024-01-14 DIAGNOSIS — G8929 Other chronic pain: Secondary | ICD-10-CM | POA: Diagnosis present

## 2024-01-14 DIAGNOSIS — Z87440 Personal history of urinary (tract) infections: Secondary | ICD-10-CM | POA: Diagnosis not present

## 2024-01-14 DIAGNOSIS — F418 Other specified anxiety disorders: Secondary | ICD-10-CM | POA: Diagnosis not present

## 2024-01-14 DIAGNOSIS — R5381 Other malaise: Secondary | ICD-10-CM | POA: Diagnosis present

## 2024-01-14 DIAGNOSIS — Z90710 Acquired absence of both cervix and uterus: Secondary | ICD-10-CM | POA: Diagnosis not present

## 2024-01-14 DIAGNOSIS — N1831 Chronic kidney disease, stage 3a: Secondary | ICD-10-CM | POA: Diagnosis present

## 2024-01-14 DIAGNOSIS — J189 Pneumonia, unspecified organism: Secondary | ICD-10-CM | POA: Diagnosis present

## 2024-01-14 DIAGNOSIS — Z79899 Other long term (current) drug therapy: Secondary | ICD-10-CM | POA: Diagnosis not present

## 2024-01-14 DIAGNOSIS — Z888 Allergy status to other drugs, medicaments and biological substances status: Secondary | ICD-10-CM | POA: Diagnosis not present

## 2024-01-14 DIAGNOSIS — I129 Hypertensive chronic kidney disease with stage 1 through stage 4 chronic kidney disease, or unspecified chronic kidney disease: Secondary | ICD-10-CM | POA: Diagnosis present

## 2024-01-14 DIAGNOSIS — M159 Polyosteoarthritis, unspecified: Secondary | ICD-10-CM | POA: Diagnosis present

## 2024-01-14 DIAGNOSIS — I10 Essential (primary) hypertension: Secondary | ICD-10-CM | POA: Diagnosis not present

## 2024-01-14 DIAGNOSIS — R109 Unspecified abdominal pain: Secondary | ICD-10-CM | POA: Diagnosis present

## 2024-01-14 DIAGNOSIS — F32A Depression, unspecified: Secondary | ICD-10-CM | POA: Diagnosis present

## 2024-01-14 DIAGNOSIS — F419 Anxiety disorder, unspecified: Secondary | ICD-10-CM | POA: Diagnosis present

## 2024-01-14 DIAGNOSIS — Z885 Allergy status to narcotic agent status: Secondary | ICD-10-CM | POA: Diagnosis not present

## 2024-01-14 DIAGNOSIS — K219 Gastro-esophageal reflux disease without esophagitis: Secondary | ICD-10-CM | POA: Diagnosis present

## 2024-01-14 DIAGNOSIS — I700 Atherosclerosis of aorta: Secondary | ICD-10-CM | POA: Diagnosis present

## 2024-01-14 DIAGNOSIS — Z9181 History of falling: Secondary | ICD-10-CM | POA: Diagnosis not present

## 2024-01-14 DIAGNOSIS — M797 Fibromyalgia: Secondary | ICD-10-CM | POA: Diagnosis present

## 2024-01-14 LAB — URINALYSIS, ROUTINE W REFLEX MICROSCOPIC
Bacteria, UA: NONE SEEN
Bilirubin Urine: NEGATIVE
Glucose, UA: NEGATIVE mg/dL
Hgb urine dipstick: NEGATIVE
Ketones, ur: NEGATIVE mg/dL
Nitrite: NEGATIVE
Protein, ur: NEGATIVE mg/dL
Specific Gravity, Urine: 1.028 (ref 1.005–1.030)
pH: 6 (ref 5.0–8.0)

## 2024-01-14 LAB — CBC
HCT: 27 % — ABNORMAL LOW (ref 36.0–46.0)
Hemoglobin: 8.9 g/dL — ABNORMAL LOW (ref 12.0–15.0)
MCH: 28.8 pg (ref 26.0–34.0)
MCHC: 33 g/dL (ref 30.0–36.0)
MCV: 87.4 fL (ref 80.0–100.0)
Platelets: 229 10*3/uL (ref 150–400)
RBC: 3.09 MIL/uL — ABNORMAL LOW (ref 3.87–5.11)
RDW: 12.9 % (ref 11.5–15.5)
WBC: 5.5 10*3/uL (ref 4.0–10.5)
nRBC: 0 % (ref 0.0–0.2)

## 2024-01-14 LAB — PROCALCITONIN: Procalcitonin: 0.56 ng/mL

## 2024-01-14 LAB — WET PREP, GENITAL
Clue Cells Wet Prep HPF POC: NONE SEEN
Sperm: NONE SEEN
Trich, Wet Prep: NONE SEEN
WBC, Wet Prep HPF POC: <10 (ref <10)
Yeast Wet Prep HPF POC: NONE SEEN

## 2024-01-14 LAB — BASIC METABOLIC PANEL WITH GFR
Anion gap: 8 (ref 5–15)
BUN: 20 mg/dL (ref 8–23)
CO2: 24 mmol/L (ref 22–32)
Calcium: 9.1 mg/dL (ref 8.9–10.3)
Chloride: 104 mmol/L (ref 98–111)
Creatinine, Ser: 0.9 mg/dL (ref 0.44–1.00)
GFR, Estimated: >60 mL/min (ref >60)
Glucose, Bld: 111 mg/dL — ABNORMAL HIGH (ref 70–99)
Potassium: 3.4 mmol/L — ABNORMAL LOW (ref 3.5–5.1)
Sodium: 136 mmol/L (ref 135–145)

## 2024-01-14 LAB — MAGNESIUM: Magnesium: 1.5 mg/dL — ABNORMAL LOW (ref 1.7–2.4)

## 2024-01-14 LAB — MRSA NEXT GEN BY PCR, NASAL: MRSA by PCR Next Gen: NOT DETECTED

## 2024-01-14 LAB — STREP PNEUMONIAE URINARY ANTIGEN: Strep Pneumo Urinary Antigen: NEGATIVE

## 2024-01-14 LAB — BRAIN NATRIURETIC PEPTIDE: B Natriuretic Peptide: 148 pg/mL — ABNORMAL HIGH (ref 0.0–100.0)

## 2024-01-14 MED ORDER — TRAMADOL HCL 50 MG PO TABS
50.0000 mg | ORAL_TABLET | Freq: Two times a day (BID) | ORAL | Status: DC | PRN
  Administered 2024-01-15: 50 mg via ORAL
  Filled 2024-01-14: qty 1

## 2024-01-14 MED ORDER — CITALOPRAM HYDROBROMIDE 20 MG PO TABS
40.0000 mg | ORAL_TABLET | Freq: Every day | ORAL | Status: DC
  Administered 2024-01-14 – 2024-01-15 (×2): 40 mg via ORAL
  Filled 2024-01-14 (×2): qty 2

## 2024-01-14 MED ORDER — SODIUM CHLORIDE 0.9 % IV SOLN
1.0000 g | Freq: Once | INTRAVENOUS | Status: AC
  Administered 2024-01-14: 1 g via INTRAVENOUS
  Filled 2024-01-14: qty 10

## 2024-01-14 MED ORDER — MAGNESIUM SULFATE 2 GM/50ML IV SOLN
2.0000 g | Freq: Once | INTRAVENOUS | Status: AC
  Administered 2024-01-14: 2 g via INTRAVENOUS
  Filled 2024-01-14: qty 50

## 2024-01-14 MED ORDER — HYDROCHLOROTHIAZIDE 12.5 MG PO TABS
12.5000 mg | ORAL_TABLET | Freq: Every day | ORAL | Status: DC
  Administered 2024-01-14 – 2024-01-15 (×2): 12.5 mg via ORAL
  Filled 2024-01-14 (×2): qty 1

## 2024-01-14 MED ORDER — SODIUM CHLORIDE 0.9 % IV SOLN
500.0000 mg | Freq: Once | INTRAVENOUS | Status: AC
  Administered 2024-01-14: 500 mg via INTRAVENOUS
  Filled 2024-01-14: qty 5

## 2024-01-14 MED ORDER — SENNOSIDES-DOCUSATE SODIUM 8.6-50 MG PO TABS: 1.0000 | ORAL_TABLET | Freq: Every evening | ORAL | Status: DC | PRN

## 2024-01-14 MED ORDER — PANTOPRAZOLE SODIUM 40 MG PO TBEC
40.0000 mg | DELAYED_RELEASE_TABLET | Freq: Every day | ORAL | Status: DC
  Administered 2024-01-14 – 2024-01-15 (×2): 40 mg via ORAL
  Filled 2024-01-14 (×2): qty 1

## 2024-01-14 MED ORDER — ENOXAPARIN SODIUM 40 MG/0.4ML IJ SOSY
40.0000 mg | PREFILLED_SYRINGE | INTRAMUSCULAR | Status: DC
  Administered 2024-01-14 – 2024-01-15 (×2): 40 mg via SUBCUTANEOUS
  Filled 2024-01-14 (×2): qty 0.4

## 2024-01-14 MED ORDER — ONDANSETRON HCL 4 MG/2ML IJ SOLN: 4.0000 mg | Freq: Four times a day (QID) | INTRAMUSCULAR | Status: DC | PRN

## 2024-01-14 MED ORDER — ACETAMINOPHEN 650 MG RE SUPP: 650.0000 mg | Freq: Four times a day (QID) | RECTAL | Status: DC | PRN

## 2024-01-14 MED ORDER — POTASSIUM CHLORIDE CRYS ER 20 MEQ PO TBCR
10.0000 meq | EXTENDED_RELEASE_TABLET | Freq: Once | ORAL | Status: AC
  Administered 2024-01-14: 10 meq via ORAL
  Filled 2024-01-14: qty 1

## 2024-01-14 MED ORDER — LOSARTAN POTASSIUM 50 MG PO TABS
50.0000 mg | ORAL_TABLET | Freq: Every day | ORAL | Status: DC
  Administered 2024-01-14 – 2024-01-15 (×2): 50 mg via ORAL
  Filled 2024-01-14 (×2): qty 1

## 2024-01-14 MED ORDER — AZITHROMYCIN 250 MG PO TABS
500.0000 mg | ORAL_TABLET | Freq: Every day | ORAL | Status: DC
  Administered 2024-01-15: 500 mg via ORAL
  Filled 2024-01-14: qty 2

## 2024-01-14 MED ORDER — SODIUM CHLORIDE 0.9 % IV SOLN
2.0000 g | INTRAVENOUS | Status: DC
  Administered 2024-01-15: 2 g via INTRAVENOUS
  Filled 2024-01-14: qty 20

## 2024-01-14 MED ORDER — ACETAMINOPHEN 325 MG PO TABS: 650.0000 mg | ORAL_TABLET | Freq: Four times a day (QID) | ORAL | Status: DC | PRN

## 2024-01-14 MED ORDER — SODIUM CHLORIDE 0.9% FLUSH
3.0000 mL | Freq: Two times a day (BID) | INTRAVENOUS | Status: DC
  Administered 2024-01-14 – 2024-01-15 (×5): 3 mL via INTRAVENOUS

## 2024-01-14 MED ORDER — CHLORHEXIDINE GLUCONATE CLOTH 2 % EX PADS
6.0000 | MEDICATED_PAD | Freq: Every day | CUTANEOUS | Status: DC
  Administered 2024-01-14 – 2024-01-15 (×2): 6 via TOPICAL

## 2024-01-14 MED ORDER — ONDANSETRON HCL 4 MG PO TABS: 4.0000 mg | ORAL_TABLET | Freq: Four times a day (QID) | ORAL | Status: DC | PRN

## 2024-01-14 MED ORDER — LINACLOTIDE 72 MCG PO CAPS
72.0000 ug | ORAL_CAPSULE | Freq: Every day | ORAL | Status: DC
  Administered 2024-01-15: 72 ug via ORAL
  Filled 2024-01-14 (×4): qty 1

## 2024-01-14 MED ORDER — POTASSIUM CHLORIDE CRYS ER 20 MEQ PO TBCR
40.0000 meq | EXTENDED_RELEASE_TABLET | Freq: Once | ORAL | Status: AC
  Administered 2024-01-14: 40 meq via ORAL
  Filled 2024-01-14: qty 2

## 2024-01-14 NOTE — ED Notes (Signed)
 Called transporter 1 to take patient up to ICU bed 11 tele bed.

## 2024-01-14 NOTE — Plan of Care (Signed)
  Problem: Acute Rehab OT Goals (only OT should resolve) Goal: Pt. Will Perform Grooming Flowsheets (Taken 01/14/2024 1123) Pt Will Perform Grooming:  with modified independence  standing Goal: Pt. Will Perform Lower Body Bathing Flowsheets (Taken 01/14/2024 1123) Pt Will Perform Lower Body Bathing:  with modified independence  sitting/lateral leans Goal: Pt. Will Perform Lower Body Dressing Flowsheets (Taken 01/14/2024 1123) Pt Will Perform Lower Body Dressing:  with modified independence  sitting/lateral leans Goal: Pt. Will Transfer To Toilet Flowsheets (Taken 01/14/2024 1123) Pt Will Transfer to Toilet:  with modified independence  ambulating Goal: Pt. Will Perform Toileting-Clothing Manipulation Flowsheets (Taken 01/14/2024 1123) Pt Will Perform Toileting - Clothing Manipulation and hygiene: with modified independence Goal: Pt/Caregiver Will Perform Home Exercise Program Flowsheets (Taken 01/14/2024 1123) Pt/caregiver will Perform Home Exercise Program:  Increased ROM  Increased strength  Both right and left upper extremity  Independently  Cathlin Buchan OT, MOT

## 2024-01-14 NOTE — ED Notes (Signed)
 Pt ambulated in room, stats stayed at 95%, respiratory rate increased.

## 2024-01-14 NOTE — [place] FL2 (Signed)
 [place]  MEDICAID FL2 LEVEL OF CARE FORM     IDENTIFICATION  Patient Name: Marie [name] Birthdate: Nov 29, 1940 Sex: female Admission Date (Current Location): 01/13/2024  [facility] and [place] Number:  [employer] and Address:  [facility],  [address] [place] [address]      Provider Number: [id number]  Attending Physician Name and Address:  Cornelius Dill, DO  Relative Name and Phone Number:  Bernard Brick [phone])  [phone]    Current Level of Care: Hospital Recommended Level of Care: Skilled Nursing Facility Prior Approval Number:    Date Approved/Denied:   PASRR Number: [id number]  Discharge Plan: Home    Current Diagnoses: Patient Active Problem List   Diagnosis Date Noted   Pneumonia 01/14/2024   CKD stage 3a, GFR 45-59 ml/min (HCC) 01/14/2024   Altered mental status 07/25/2023   Urinary tract infection 07/23/2023   Atypical pneumonia 05/03/2023   Elevated troponin 05/03/2023   Near syncope 05/03/2023   Acute respiratory failure with hypoxia (HCC) 05/02/2023   Sensory disturbance 04/25/2023   Seizure-like activity (HCC) 04/25/2023   Speech abnormality 04/24/2023   Dyspnea 04/24/2023   Chest pain 11/21/2020   Hallux valgus 02/18/2019   Pain of toe of right foot 02/18/2019   Fibromyalgia 01/29/2018   Dehydration 01/04/2017   Onychomycosis of toenail 09/19/2016   Foot pain 09/19/2016   Peroneal tendinitis of left lower extremity 09/19/2016   LLQ pain 12/27/2015   Weight loss 12/17/2011   Lymphocytic colitis 11/21/2011   LUQ pain 11/21/2011   Diarrhea 04/30/2011   ANEMIA, MILD 08/05/2008   Depression with anxiety 08/05/2008   Essential hypertension 08/05/2008   GERD 08/05/2008   Irritable bowel syndrome 08/05/2008   Diffuse connective tissue disease (HCC) 08/05/2008   Arthropathy 08/05/2008   FIBROMYALGIA 08/05/2008   ABDOMINAL BLOATING 08/05/2008   Incontinence of feces 08/05/2008   DIARRHEA, RECURRENT 08/05/2008    Abdominal pain 08/05/2008   DIVERTICULOSIS, COLON, HX OF 08/05/2008    Orientation RESPIRATION BLADDER Height & Weight     Self, Time, Situation, Place  Normal Continent Weight: 60 kg Height:  5\' 4"  (162.6 cm)  BEHAVIORAL SYMPTOMS/MOOD NEUROLOGICAL BOWEL NUTRITION STATUS      Continent Diet (See DC Summary)  AMBULATORY STATUS COMMUNICATION OF NEEDS Skin   Extensive Assist Verbally Bruising                       Personal Care Assistance Level of Assistance  Bathing, Feeding, Dressing Bathing Assistance: Maximum assistance Feeding assistance: Limited assistance Dressing Assistance: Limited assistance     Functional Limitations Info  Sight, Hearing, Speech Sight Info: Adequate Hearing Info: Adequate Speech Info: Adequate    SPECIAL CARE FACTORS FREQUENCY  PT (By licensed PT)     PT Frequency: 5 times a week              Contractures Contractures Info: Not present    Additional Factors Info  Code Status, Allergies Code Status Info: FULL Allergies Info: Codeine, Promethazine           Current Medications (01/14/2024):  This is the current hospital active medication list Current Facility-Administered Medications  Medication Dose Route Frequency Provider Last Rate Last Admin   acetaminophen  (TYLENOL ) tablet 650 mg  650 mg Oral Q6H PRN Opyd, Timothy S, MD       Or   acetaminophen  (TYLENOL ) suppository 650 mg  650 mg Rectal Q6H PRN Opyd, Santana Cue, MD       [  START ON 01/15/2024] azithromycin  (ZITHROMAX ) tablet 500 mg  500 mg Oral Daily Opyd, Timothy S, MD       [START ON 01/15/2024] cefTRIAXone  (ROCEPHIN ) 2 g in sodium chloride  0.9 % 100 mL IVPB  2 g Intravenous Q24H Opyd, Timothy S, MD       Chlorhexidine Gluconate Cloth 2 % PADS 6 each  6 each Topical Q0600 Shah, Pratik D, DO   6 each at 01/14/24 0945   citalopram  (CELEXA ) tablet 40 mg  40 mg Oral Daily Opyd, Timothy S, MD   40 mg at 01/14/24 0944   enoxaparin  (LOVENOX ) injection 40 mg  40 mg Subcutaneous Q24H  Opyd, Timothy S, MD   40 mg at 01/14/24 0945   hydrochlorothiazide (HYDRODIURIL) tablet 12.5 mg  12.5 mg Oral Daily Opyd, Timothy S, MD   12.5 mg at 01/14/24 1610   linaclotide  (LINZESS ) capsule 72 mcg  72 mcg Oral QAC breakfast Opyd, Timothy S, MD       losartan  (COZAAR ) tablet 50 mg  50 mg Oral Daily Opyd, Timothy S, MD   50 mg at 01/14/24 0944   ondansetron  (ZOFRAN ) tablet 4 mg  4 mg Oral Q6H PRN Opyd, Timothy S, MD       Or   ondansetron  (ZOFRAN ) injection 4 mg  4 mg Intravenous Q6H PRN Opyd, Timothy S, MD       pantoprazole  (PROTONIX ) EC tablet 40 mg  40 mg Oral Daily Opyd, Timothy S, MD   40 mg at 01/14/24 0944   senna-docusate (Senokot-S) tablet 1 tablet  1 tablet Oral QHS PRN Opyd, Timothy S, MD       sodium chloride  flush (NS) 0.9 % injection 3 mL  3 mL Intravenous Q12H Opyd, Timothy S, MD   3 mL at 01/14/24 0945   traMADol  (ULTRAM ) tablet 50 mg  50 mg Oral Q12H PRN Opyd, Timothy S, MD         Discharge Medications: Please see discharge summary for a list of discharge medications.  Relevant Imaging Results:  Relevant Lab Results:   Additional Information SS#[id number]  Orelia Binet, RN

## 2024-01-14 NOTE — TOC Initial Note (Signed)
 Transition of Care [facility]) - Initial/Assessment Note    Patient Details  Name: Marie [name] MRN: [id number] Date of Birth: 1941-03-16  [facility]) CM/SW Contact:    Orelia Binet, RN Phone Number: 01/14/2024, 11:48 AM  Clinical Narrative:         Patient admitted with pneumonia. PT is recommending SNF.  CM at the bedside to discuss PT eval. Patient is agreeable to SNF, she has been to [facility] in the past and requested a bed there. FL2 completed and sent out. TOC following to send PT note and start INS AUTH.           Expected Discharge Plan: Skilled Nursing Facility Barriers to Discharge: Continued Medical Work up   Patient Goals and CMS Choice Patient states their goals for this hospitalization and ongoing recovery are:: agreeeable to SNF CMS Medicare.gov Compare Post Acute Care list provided to:: Patient Choice offered to / list presented to : Patient [place] ownership interest in [facility].provided to:: Patient   Expected Discharge Plan and Services      Living arrangements for the past 2 months: Single Family Home      Prior Living Arrangements/Services Living arrangements for the past 2 months: Single Family Home Lives with:: Self Patient language and need for interpreter reviewed:: Yes        Need for Family Participation in Patient Care: Yes (Comment) Care giver support system in place?: Yes (comment) Current home services: DME Criminal Activity/Legal Involvement Pertinent to Current Situation/Hospitalization: No - Comment as needed  Activities of Daily Living   ADL Screening (condition at time of admission) Independently performs ADLs?: Yes (appropriate for developmental age) Is the patient deaf or have difficulty hearing?: No Does the patient have difficulty seeing, even when wearing glasses/contacts?: No Does the patient have difficulty concentrating, remembering, or making decisions?: No  Permission Sought/Granted             Permission granted to share info w Relationship: son     Emotional Assessment     Affect (typically observed): Accepting Orientation: : Oriented to Self, Oriented to Place, Oriented to  Time, Oriented to Situation Alcohol / Substance Use: Not Applicable Psych Involvement: No (comment)  Admission diagnosis:  Pneumonia [J18.9] Atypical pneumonia [J18.9] Patient Active Problem List   Diagnosis Date Noted   Pneumonia 01/14/2024   CKD stage 3a, GFR 45-59 ml/min (HCC) 01/14/2024   Altered mental status 07/25/2023   Urinary tract infection 07/23/2023   Atypical pneumonia 05/03/2023   Elevated troponin 05/03/2023   Near syncope 05/03/2023   Acute respiratory failure with hypoxia (HCC) 05/02/2023   Sensory disturbance 04/25/2023   Seizure-like activity (HCC) 04/25/2023   Speech abnormality 04/24/2023   Dyspnea 04/24/2023   Chest pain 11/21/2020   Hallux valgus 02/18/2019   Pain of toe of right foot 02/18/2019   Fibromyalgia 01/29/2018   Dehydration 01/04/2017   Onychomycosis of toenail 09/19/2016   Foot pain 09/19/2016   Peroneal tendinitis of left lower extremity 09/19/2016   LLQ pain 12/27/2015   Weight loss 12/17/2011   Lymphocytic colitis 11/21/2011   LUQ pain 11/21/2011   Diarrhea 04/30/2011   ANEMIA, MILD 08/05/2008   Depression with anxiety 08/05/2008   Essential hypertension 08/05/2008   GERD 08/05/2008   Irritable bowel syndrome 08/05/2008   Diffuse connective tissue disease (HCC) 08/05/2008   Arthropathy 08/05/2008   FIBROMYALGIA 08/05/2008   ABDOMINAL BLOATING 08/05/2008   Incontinence of feces 08/05/2008   DIARRHEA, RECURRENT 08/05/2008  Abdominal pain 08/05/2008   DIVERTICULOSIS, COLON, HX OF 08/05/2008   PCP:  [facility] Pharmacy:   [facility] - [place], [place] - [address] [place] [address] Phone: [phone] Fax: [phone]     Social Drivers of Health (SDOH) Social  History: SDOH Screenings   Food Insecurity: No Food Insecurity (01/14/2024)  Housing: Low Risk  (01/14/2024)  Transportation Needs: No Transportation Needs (01/14/2024)  Utilities: Not At Risk (01/14/2024)  Social Connections: Moderately Integrated (01/14/2024)  Tobacco Use: Low Risk  (01/14/2024)   SDOH Interventions:     Readmission Risk Interventions    05/07/2023   10:24 AM 05/03/2023   11:39 AM  Readmission Risk Prevention Plan  Post Dischage Appt Complete   Medication Screening Complete   Transportation Screening Complete Complete  PCP or Specialist Appt within 5-7 Days  Not Complete  Home Care Screening  Complete  Medication Review (RN CM)  Complete

## 2024-01-14 NOTE — Plan of Care (Signed)
  Problem: Acute Rehab PT Goals(only PT should resolve) Goal: Pt Will Go Supine/Side To Sit Outcome: Progressing Flowsheets (Taken 01/14/2024 1503) Pt will go Supine/Side to Sit:  with supervision  with contact guard assist Goal: Patient Will Transfer Sit To/From Stand Outcome: Progressing Flowsheets (Taken 01/14/2024 1503) Patient will transfer sit to/from stand:  with supervision  with contact guard assist Goal: Pt Will Transfer Bed To Chair/Chair To Bed Outcome: Progressing Flowsheets (Taken 01/14/2024 1503) Pt will Transfer Bed to Chair/Chair to Bed:  with contact guard assist  with min assist Goal: Pt Will Ambulate Outcome: Progressing Flowsheets (Taken 01/14/2024 1503) Pt will Ambulate:  50 feet  with contact guard assist  with minimal assist  with rolling walker   3:03 PM, 01/14/24 Walton Guppy, MPT Physical Therapist with [facility] [phone] office [phone] mobile phone

## 2024-01-14 NOTE — H&P (Signed)
 History and Physical    Marie [name] ZOX:[id number] DOB: Aug 18, 1940 DOA: 01/13/2024  PCP: [facility]   Patient coming from: Home   Chief Complaint: Pain in left side, fatigue, cough, DOE   HPI: Marie [name] is an 83 y.o. female with medical history significant for depression, anxiety, fibromyalgia, hypertension, IBS, recurrent UTIs, and CKD 3A who presents with pain in her left side, increased fatigue, cough, and exertional dyspnea.  Patient reports cough, exertional dyspnea, and worsening fatigue which has become severe.  She has also developed atraumatic pain around her left flank that is worse with certain movements or palpation.  She denies any leg swelling, chest pain, or vomiting.  ED Course: Upon arrival to the ED, patient is found to be afebrile and saturating mid 90s on room air with normal HR and stable BP.  Labs are most notable for creatinine 1.04, albumin 2.5, normal WBC, hemoglobin 9.0, normal troponin x 2, and BNP 148.  CTA is concerning for atypical infection or pulmonary edema, hiatal hernia with debris in the esophagus, and fluid in the vagina with vaginal wall thickening.  Wet prep was performed in the ED and the patient was treated with Rocephin  and azithromycin .  Review of Systems:  All other systems reviewed and apart from HPI, are negative.  Past Medical History:  Diagnosis Date   Anemia    Collagenous colitis    colonoscopy 2009   Depression    Fibromyalgia    GERD (gastroesophageal reflux disease)    Hiatal hernia 2008   small   Hypercholesterolemia    Hypertension    IBS (irritable bowel syndrome)    Lymphocytic colitis    colonoscopy 2007, TCS 2013   S/P endoscopy Sept 2008   small hh, s/p 2 French dilator    Past Surgical History:  Procedure Laterality Date   ABDOMINAL HYSTERECTOMY     BIOPSY  01/09/2017   Procedure: BIOPSY;  Surgeon: Suzette Espy, MD;  Location: [facility];  Service: Endoscopy;;  colon    CATARACT EXTRACTION Bilateral 2021   COLONOSCOPY  04/13/2008   Dr.Rourk- anal papilla and internal hemorrhage otherwise normal /left side diverticula   COLONOSCOPY  09/20/2011   Dr.Rourk- Adequate preparation. Internal hemorrhoids; otherwise normal/ Left-sided diverticula, biopsies positive for lymphocytic colitis   COLONOSCOPY N/A 01/09/2017   Procedure: COLONOSCOPY;  Surgeon: Suzette Espy, MD;  Location: [facility];  Service: Endoscopy;  Laterality: N/A;  7:30am   ESOPHAGOGASTRODUODENOSCOPY  04/22/2007   Dr.Rourk- normal esophagus, small hiatal hernia o/w normal stomach, D1 and D2 s/p passage of a 56-French maloney dilator   fistula of colon     FLEXIBLE SIGMOIDOSCOPY  05/29/2006   Dr.Rehman- No evidence of pseudomembranous or acute colitis.   FOOT SURGERY     KNEE SURGERY     NOSE SURGERY     VEIN SURGERY Bilateral 2021    Social History:   reports that she has never smoked. She has never used smokeless tobacco. She reports that she does not drink alcohol and does not use drugs.  Allergies  Allergen Reactions   Codeine Hives and Swelling   Promethazine Hcl Swelling    Family History  Problem Relation Age of Onset   Colon cancer Maternal Uncle    Colon cancer Maternal Grandmother      Prior to Admission medications   Medication Sig Start Date End Date Taking? Authorizing Provider  amoxicillin-clavulanate (AUGMENTIN) 875-125 MG tablet Take 1 tablet by  mouth every 12 (twelve) hours. 01/13/24  Yes Merdis Stalling, MD  acetaminophen  (TYLENOL ) 500 MG tablet Take 2 tablets (1,000 mg total) by mouth every 8 (eight) hours as needed for mild pain (pain score 1-3), fever or headache. 07/25/23   Justina Oman, MD  Ascorbic Acid (VITAMIN C PO) Take 1 tablet by mouth every morning.    [provider]  CALCIUM PO Take 1 tablet by mouth every morning.    [provider]  citalopram  (CELEXA ) 40 MG tablet Take 40 mg by mouth daily. 12/02/23   [provider]   clonazePAM  (KLONOPIN ) 1 MG tablet Take 1 tablet (1 mg total) by mouth 2 (two) times daily as needed for anxiety. 07/25/23   Justina Oman, MD  diclofenac  Sodium (VOLTAREN ) 1 % GEL Apply 4 g topically 4 (four) times daily as needed (joint and musculoskeletal pain). Apply to affected areas 05/07/23   Rayfield Cairo, MD  esomeprazole  (NEXIUM ) 40 MG capsule Take 1 capsule (40 mg total) by mouth 2 (two) times daily before a meal. 12/25/23   Rourk, Windsor Hatcher, MD  hydrochlorothiazide (HYDRODIURIL) 12.5 MG tablet Take 12.5 mg by mouth daily. 10/14/23   [provider]  hyoscyamine  (LEVSIN) 0.125 MG tablet Take 0.125 mg by mouth every 8 (eight) hours as needed for cramping. 05/22/23   [provider]  levofloxacin (LEVAQUIN) 750 MG tablet Take 750 mg by mouth every other day. 01/13/24   [provider]  linaclotide  (LINZESS ) 72 MCG capsule Take 1 capsule (72 mcg total) by mouth daily before breakfast. 12/25/23   Rourk, Windsor Hatcher, MD  loratadine  (CLARITIN ) 10 MG tablet Take 1 tablet (10 mg total) by mouth daily. 07/26/23   Justina Oman, MD  losartan  (COZAAR ) 50 MG tablet Take 50 mg by mouth daily.    [provider]  Multiple Vitamins-Minerals (CENTRUM SILVER ULTRA WOMENS PO) Take 1 tablet by mouth every morning.    [provider]  nitrofurantoin (MACRODANTIN) 100 MG capsule Take 100 mg by mouth at bedtime. 01/09/24 01/19/24  [provider]  Omega-3 Fatty Acids (FISH OIL) 1000 MG CAPS Take 2 capsules by mouth daily.    [provider]  traMADol  (ULTRAM ) 50 MG tablet Take 1 tablet (50 mg total) by mouth every 12 (twelve) hours as needed for severe pain (pain score 7-10). 07/25/23   Justina Oman, MD  vitamin B-12 (CYANOCOBALAMIN ) 1000 MCG tablet Take 1,000 mcg by mouth daily.    [provider]    Physical Exam: Vitals:   01/13/24 2330 01/14/24 0000 01/14/24 0030 01/14/24 0200  BP: (!) 149/67 (!) 166/65 (!) 152/68 (!) 156/57  Pulse:  68 65 62 61  Resp: 18 17 12 17   Temp: 98 F (36.7 C)  98.3 F (36.8 C)   TempSrc:   Oral   SpO2: 97% 97% 94% 95%  Weight:      Height:        Constitutional: NAD, no pallor or diaphoresis   Eyes: PERTLA, lids and conjunctivae normal ENMT: Mucous membranes are moist. Posterior pharynx clear of any exudate or lesions.   Neck: supple, no masses  Respiratory: Tachypneic. Dyspneic with speech. No wheezing.    Cardiovascular: S1 & S2 heard, regular rate and rhythm. No extremity edema.  Abdomen: No tenderness, soft. Bowel sounds active.  Musculoskeletal: no clubbing / cyanosis. No joint deformity upper and lower extremities.   Skin: no significant rashes, lesions, ulcers. Warm, dry, well-perfused. Neurologic: CN 2-12 grossly intact. Moving all  extremities. Alert and oriented.  Psychiatric: Calm. Cooperative.    Labs and Imaging on Admission: I have personally reviewed following labs and imaging studies  CBC: Recent Labs  Lab 01/13/24 2050  WBC 6.4  NEUTROABS 3.9  HGB 9.0*  HCT 26.6*  MCV 88.7  PLT 252   Basic Metabolic Panel: Recent Labs  Lab 01/13/24 2050  NA 135  K 3.4*  CL 102  CO2 23  GLUCOSE 141*  BUN 26*  CREATININE 1.04*  CALCIUM 9.3   GFR: Estimated Creatinine Clearance: 36.6 mL/min (A) (by C-G formula based on SCr of 1.04 mg/dL (H)). Liver Function Tests: Recent Labs  Lab 01/13/24 2050  AST 21  ALT 19  ALKPHOS 59  BILITOT 0.3  PROT 6.2*  ALBUMIN 2.5*   No results for input(s): "LIPASE", "AMYLASE" in the last 168 hours. No results for input(s): "AMMONIA" in the last 168 hours. Coagulation Profile: No results for input(s): "INR", "PROTIME" in the last 168 hours. Cardiac Enzymes: No results for input(s): "CKTOTAL", "CKMB", "CKMBINDEX", "TROPONINI" in the last 168 hours. BNP (last 3 results) No results for input(s): "PROBNP" in the last 8760 hours. HbA1C: No results for input(s): "HGBA1C" in the last 72 hours. CBG: No results for input(s):  "GLUCAP" in the last 168 hours. Lipid Profile: No results for input(s): "CHOL", "HDL", "LDLCALC", "TRIG", "CHOLHDL", "LDLDIRECT" in the last 72 hours. Thyroid  Function Tests: No results for input(s): "TSH", "T4TOTAL", "FREET4", "T3FREE", "THYROIDAB" in the last 72 hours. Anemia Panel: No results for input(s): "VITAMINB12", "FOLATE", "FERRITIN", "TIBC", "IRON", "RETICCTPCT" in the last 72 hours. Urine analysis:    Component Value Date/Time   COLORURINE STRAW (A) 01/13/2024 2300   APPEARANCEUR CLEAR 01/13/2024 2300   LABSPEC 1.028 01/13/2024 2300   PHURINE 6.0 01/13/2024 2300   GLUCOSEU NEGATIVE 01/13/2024 2300   HGBUR NEGATIVE 01/13/2024 2300   BILIRUBINUR NEGATIVE 01/13/2024 2300   KETONESUR NEGATIVE 01/13/2024 2300   PROTEINUR NEGATIVE 01/13/2024 2300   UROBILINOGEN 0.2 08/14/2011 1753   NITRITE NEGATIVE 01/13/2024 2300   LEUKOCYTESUR TRACE (A) 01/13/2024 2300   Sepsis Labs: @LABRCNTIP (procalcitonin:4,lacticidven:4) ) Recent Results (from the past 240 hours)  Wet prep, genital     Status: None   Collection Time: 01/13/24 11:40 PM   Specimen: PATH Cytology Cervicovaginal Ancillary Only  Result Value Ref Range Status   Yeast Wet Prep HPF POC NONE SEEN NONE SEEN Final   Trich, Wet Prep NONE SEEN NONE SEEN Final   Clue Cells Wet Prep HPF POC NONE SEEN NONE SEEN Final   WBC, Wet Prep HPF POC <10 <10 Final   Sperm NONE SEEN  Final    Comment: Performed at [facility], [address], [place], [place] [address]     Radiological Exams on Admission: CT Angio Chest/Abd/Pel for Dissection W and/or Wo Contrast Result Date: 01/13/2024 CLINICAL DATA:  Left rib pain. Recurrent UTIs. Unable to eat or drink. Acute aortic syndrome suspected EXAM: CT ANGIOGRAPHY CHEST, ABDOMEN AND PELVIS TECHNIQUE: Non-contrast CT of the chest was initially obtained. Multidetector CT imaging through the chest, abdomen and pelvis was performed using the standard protocol during bolus administration of  intravenous contrast. Multiplanar reconstructed images and MIPs were obtained and reviewed to evaluate the vascular anatomy. RADIATION DOSE REDUCTION: This exam was performed according to the departmental dose-optimization program which includes automated exposure control, adjustment of the mA and/or kV according to patient size and/or use of iterative reconstruction technique. CONTRAST:  OMNIPAQUE  IOHEXOL  350 MG/ML SOLN COMPARISON:  CT abdomen pelvis  04/24/2023 and CTA chest 05/28/2022 FINDINGS: CTA CHEST FINDINGS Cardiovascular: No evidence of acute aortic syndrome. Coronary artery and aortic atherosclerotic calcification. Trace pericardial effusion. Normal heart size. Mediastinum/Nodes: Moderate hiatal hernia. Debris in the esophagus reaching near the thoracic inlet. Trachea is patent. No thoracic adenopathy. Lungs/Pleura: Patchy ground-glass opacities in the upper lobes. Diffuse interlobular septal thickening greatest in the lower lobes. Bronchial wall thickening greatest in the lower lobes. Small bilateral pleural effusions. No pneumothorax. Musculoskeletal: Age indeterminate nondisplaced fracture of the right glenoid (series 9/image 49). Degenerative arthritis both shoulders. Review of the MIP images confirms the above findings. CTA ABDOMEN AND PELVIS FINDINGS VASCULAR Calcified atherosclerotic plaque in the aorta and its mesenteric, renal, and iliac artery branches without aneurysm or dissection. There is mild narrowing at the origins of the celiac axis and SMA. Moderate to severe narrowing at the origins of the bilateral renal arteries. Review of the MIP images confirms the above findings. NON-VASCULAR Hepatobiliary: Unremarkable. Pancreas: Unremarkable. Spleen: Unremarkable. Adrenals/Urinary Tract: Stable adrenal glands. Large right renal cyst does not require follow-up. Bilateral cortical renal scarring. No urinary calculi or hydronephrosis. Unremarkable bladder. Stomach/Bowel: Normal caliber large  and small bowel. Colonic diverticulosis without diverticulitis. Moderate hiatal hernia. Normal appendix. Lymphatic: No lymphadenopathy. Reproductive: Hysterectomy. Fluid in the vagina with vaginal wall thickening and hyperenhancement. Correlate for vaginitis. No adnexal mass. Other: No free intraperitoneal fluid or air. Redemonstrated right-sided spigelian hernia containing nonobstructed small bowel. Musculoskeletal: No acute fracture. Demineralization. Thoracolumbar spondylosis. Review of the MIP images confirms the above findings. IMPRESSION: 1. No evidence of acute aortic syndrome. 2. Patchy ground-glass opacities in the upper lobes with diffuse interlobular septal thickening may be due to atypical infection or edema. 3. Age indeterminate nondisplaced fracture of the right glenoid. 4. Moderate hiatal hernia with debris in the esophagus reaching near the thoracic inlet. 5. Fluid in the vagina with vaginal wall thickening and hyperenhancement. Correlate for vaginitis. 6. 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 7. Aortic Atherosclerosis (ICD10-I70.0). Electronically Signed   By: Rozell Cornet M.D.   On: 01/13/2024 22:38    EKG: Independently reviewed. Sinus rhythm.   Assessment/Plan   1. Pneumonia  - Check strep pneumo and legionella antigens, check procalcitonin, continue Rocephin  and azithromycin    2. Left flank pain   - Atraumatic, worse with movement and palpation, no acute findings noted on CT  - Likely musculoskeletal, will continue pain-control   3. Physical debility  - Presents with increased fatigue and general weakness, no longer able to perform ADLs  - No focal deficits identified  - Treat suspected pneumonia, consult PT    4. Hypertension  - Continue hydrochlorothiazide and losartan     5. Depression, anxiety  - Continue Celexa     6. CKD 3A  - Appears close to baseline  - Renally-dose medications, monitor  7. Anemia  - Appears  stable    DVT prophylaxis: Lovenox   Code Status: Full  Level of Care: Level of care: Telemetry Family Communication: None present   Disposition Plan:  Patient is from: home  Anticipated d/c is to: TBD Anticipated d/c date is: 6/11 or 01/16/24  Patient currently: Pending treatment of suspected pneumonia, PT eval Consults called: None  Admission status: Observation     Walton Guppy, MD [facility]  01/14/2024, 2:02 AM

## 2024-01-14 NOTE — Evaluation (Signed)
 Occupational Therapy Evaluation Patient Details Name: Marie [name] MRN: [id number] DOB: 1941-04-01 Today's Date: 01/14/2024   History of Present Illness   Marie [name] is an 83 y.o. female with medical history significant for depression, anxiety, fibromyalgia, hypertension, IBS, recurrent UTIs, and CKD 3A who presents with pain in her left side, increased fatigue, cough, and exertional dyspnea.     Patient reports cough, exertional dyspnea, and worsening fatigue which has become severe.  She has also developed atraumatic pain around her left flank that is worse with certain movements or palpation.  She denies any leg swelling, chest pain, or vomiting. (per MD)     Clinical Impressions Pt agreeable to OT and PT co-evaluation. Pt comes form home where she lives with her son. Pt is typically independent for ADL's and ambulates with RW. Pt required mod A for lower body dressing today due to difficulty reaching feet while seated in the chair. Pt has limited shoulder A/ROM at baseline. Mod A for transfers with RW due to labored movement. Noted labored breathing during functional ambulation and attempted lower body dressing. Pt left in the transport chair with hospital staff. Pt will benefit from continued OT in the hospital and recommended venue below to increase strength, balance, and endurance for safe ADL's.        If plan is discharge home, recommend the following:   A lot of help with walking and/or transfers;A lot of help with bathing/dressing/bathroom;Assistance with cooking/housework;Assist for transportation;Help with stairs or ramp for entrance     Functional Status Assessment   Patient has had a recent decline in their functional status and demonstrates the ability to make significant improvements in function in a reasonable and predictable amount of time.     Equipment Recommendations   None recommended by OT             Precautions/Restrictions    Precautions Precautions: Fall Recall of Precautions/Restrictions: Intact Restrictions Weight Bearing Restrictions Per Provider Order: No     Mobility Bed Mobility Overal bed mobility: Needs Assistance Bed Mobility: Supine to Sit     Supine to sit: Min assist, HOB elevated     General bed mobility comments: HOB elevated at home as well    Transfers Overall transfer level: Needs assistance Equipment used: Rolling walker (2 wheels) Transfers: Sit to/from Stand, Bed to chair/wheelchair/BSC Sit to Stand: Mod assist     Step pivot transfers: Mod assist, Min assist     General transfer comment: labored movement ; more assist needed from lower surface of recliner.      Balance Overall balance assessment: Needs assistance Sitting-balance support: No upper extremity supported, Feet supported Sitting balance-Leahy Scale: Fair Sitting balance - Comments: fair to good at EOB   Standing balance support: Bilateral upper extremity supported, During functional activity, Reliant on assistive device for balance Standing balance-Leahy Scale: Fair Standing balance comment: poor to fair with RW                           ADL either performed or assessed with clinical judgement   ADL Overall ADL's : Needs assistance/impaired     Grooming: Set up;Minimal assistance;Sitting   Upper Body Bathing: Minimal assistance;Sitting;Set up   Lower Body Bathing: Moderate assistance;Sitting/lateral leans   Upper Body Dressing : Set up;Minimal assistance;Sitting   Lower Body Dressing: Moderate assistance;Sitting/lateral leans Lower Body Dressing Details (indicate cue type and reason): Pt able to doff socks but required assist to  don while seated in the recliner. Toilet Transfer: Moderate assistance;Stand-pivot;Ambulation;Rolling walker (2 wheels) Toilet Transfer Details (indicate cue type and reason): Simulated via EOB to chair and ambulation just outside the room. Toileting- Clothing  Manipulation and Hygiene: Moderate assistance;Sitting/lateral lean       Functional mobility during ADLs: Minimal assistance;Moderate assistance;Rolling walker (2 wheels)       Vision Baseline Vision/History: 1 Wears glasses Ability to See in Adequate Light: 1 Impaired Patient Visual Report: No change from baseline Vision Assessment?: Wears glasses for reading;No apparent visual deficits     Perception Perception: Not tested       Praxis Praxis: Not tested       Pertinent Vitals/Pain Pain Assessment Pain Assessment: Faces Pain Score: 6  Pain Location: L flank Pain Descriptors / Indicators: Aching, Dull Pain Intervention(s): Limited activity within patient's tolerance, Monitored during session, Repositioned     Extremity/Trunk Assessment Upper Extremity Assessment Upper Extremity Assessment: Generalized weakness;RUE deficits/detail;LUE deficits/detail (Limited at baseline.) RUE Deficits / Details: 3-/5 baseline shoulder issue. Generally weak otherwise. LUE Deficits / Details: 2+/5 shoulder flexion from baseline deficits; generally weak otherwise.   Lower Extremity Assessment Lower Extremity Assessment: Defer to PT evaluation   Cervical / Trunk Assessment Cervical / Trunk Assessment: Kyphotic   Communication Communication Communication: No apparent difficulties   Cognition Arousal: Alert Behavior During Therapy: WFL for tasks assessed/performed Cognition: No apparent impairments                               Following commands: Intact       Cueing  General Comments   Cueing Techniques: Verbal cues;Tactile cues  Labored breathing observed when ambulating to the transport chair.   Exercises           Home Living Family/patient expects to be discharged to:: Private residence Living Arrangements: Children (son) Available Help at Discharge: Family;Available PRN/intermittently (son available "most the time.") Type of Home: House Home Access:  Stairs to enter [employer] of Steps: 1 Entrance Stairs-Rails: None Home Layout: One level     Bathroom Shower/Tub: Chief Strategy Officer: Handicapped height Bathroom Accessibility: Yes How Accessible: Accessible via wheelchair;Accessible via walker Home Equipment: Rolling Walker (2 wheels);Cane - quad;BSC/3in1;Shower seat;Grab bars - tub/shower;Grab bars - toilet;Lift chair   Additional Comments: Pt reports same living history form previous admission.      Prior Functioning/Environment Prior Level of Function : Independent/Modified Independent;Needs assist;History of Falls (last six months) (1 fall in 6 months)             Mobility Comments: RW for community and household ambulation. ADLs Comments: Independent ADL; assist IADL from family    OT Problem List: Decreased strength;Decreased range of motion;Decreased activity tolerance;Impaired balance (sitting and/or standing);Pain   OT Treatment/Interventions: Self-care/ADL training;Therapeutic exercise;DME and/or AE instruction;Therapeutic activities;Patient/family education;Balance training;Energy conservation      OT Goals(Current goals can be found in the care plan section)   Acute Rehab OT Goals Patient Stated Goal: improve function; open to rehab OT Goal Formulation: With patient Time For Goal Achievement: 01/28/24 Potential to Achieve Goals: Good   OT Frequency:  Min 2X/week    Co-evaluation PT/OT/SLP Co-Evaluation/Treatment: Yes Reason for Co-Treatment: To address functional/ADL transfers   OT goals addressed during session: ADL's and self-care      AM-PAC OT "6 Clicks" Daily Activity     Outcome Measure Help from another person eating meals?: None Help from another person taking  care of personal grooming?: A Little Help from another person toileting, which includes using toliet, bedpan, or urinal?: A Lot Help from another person bathing (including washing, rinsing, drying)?: A  Lot Help from another person to put on and taking off regular upper body clothing?: A Little Help from another person to put on and taking off regular lower body clothing?: A Lot 6 Click Score: 16   End of Session Equipment Utilized During Treatment: Rolling walker (2 wheels)  Activity Tolerance: Patient tolerated treatment well Patient left: Other (comment) (in trasport chair with hospital staff)  OT Visit Diagnosis: Unsteadiness on feet (R26.81);Other abnormalities of gait and mobility (R26.89);Muscle weakness (generalized) (M62.81);History of falling (Z91.81)                Time: 7846-9629 OT Time Calculation (min): 14 min Charges:  OT General Charges $OT Visit: 1 Visit OT Evaluation $OT Eval Low Complexity: 1 Low  Tien Spooner OT, MOT   Thurnell Floss 01/14/2024, 11:20 AM

## 2024-01-14 NOTE — Evaluation (Signed)
 Physical Therapy Evaluation Patient Details Name: Marie [name] MRN: [id number] DOB: 1940/11/28 Today's Date: 01/14/2024  History of Present Illness  Marie [name] is an 83 y.o. female with medical history significant for depression, anxiety, fibromyalgia, hypertension, IBS, recurrent UTIs, and CKD 3A who presents with pain in her left side, increased fatigue, cough, and exertional dyspnea.     Patient reports cough, exertional dyspnea, and worsening fatigue which has become severe.  She has also developed atraumatic pain around her left flank that is worse with certain movements or palpation.  She denies any leg swelling, chest pain, or vomiting.   Clinical Impression  Patient demonstrates slightly labored movement for sitting up at bedside with HOB partially raised, unsteady on feet and limited to walking to doorway before having to sit in wheelchair due to fatigue/generalized weakness. Patient tolerated sitting in wheelchair to to be taken to ICU by nursing staff. Patient will benefit from continued skilled physical therapy in hospital and recommended venue below to increase strength, balance, endurance for safe ADLs and gait.         If plan is discharge home, recommend the following: A lot of help with walking and/or transfers;A lot of help with bathing/dressing/bathroom;Help with stairs or ramp for entrance;Assistance with cooking/housework   Can travel by private vehicle   Yes    Equipment Recommendations None recommended by PT  Recommendations for Other Services       Functional Status Assessment Patient has had a recent decline in their functional status and demonstrates the ability to make significant improvements in function in a reasonable and predictable amount of time.     Precautions / Restrictions Precautions Precautions: Fall Recall of Precautions/Restrictions: Intact Restrictions Weight Bearing Restrictions Per Provider Order: No      Mobility  Bed  Mobility Overal bed mobility: Needs Assistance Bed Mobility: Supine to Sit     Supine to sit: Min assist, HOB elevated     General bed mobility comments: HOB elevated at home as well    Transfers Overall transfer level: Needs assistance Equipment used: Rolling walker (2 wheels) Transfers: Sit to/from Stand, Bed to chair/wheelchair/BSC Sit to Stand: Mod assist   Step pivot transfers: Mod assist, Min assist       General transfer comment: unstedy labored movement    Ambulation/Gait Ambulation/Gait assistance: Min assist, Mod assist Gait Distance (Feet): 18 Feet Assistive device: Rolling walker (2 wheels) Gait Pattern/deviations: Decreased step length - left, Decreased stride length, Decreased step length - right Gait velocity: decreased     General Gait Details: slow labored movement without loss of balance, limited mostly due to fatigue  Stairs            Wheelchair Mobility     Tilt Bed    Modified Rankin (Stroke Patients Only)       Balance Overall balance assessment: Needs assistance Sitting-balance support: No upper extremity supported, Feet supported Sitting balance-Leahy Scale: Fair Sitting balance - Comments: fair/good seated at EOB   Standing balance support: Bilateral upper extremity supported, During functional activity, Reliant on assistive device for balance Standing balance-Leahy Scale: Poor Standing balance comment: fair/poor using RW                             Pertinent Vitals/Pain Pain Assessment Pain Assessment: 0-10 Pain Score: 6  Pain Location: L flank Pain Descriptors / Indicators: Aching, Dull Pain Intervention(s): Limited activity within patient's tolerance, Monitored during session, Repositioned  Home Living Family/patient expects to be discharged to:: Private residence Living Arrangements: Children Available Help at Discharge: Family;Available PRN/intermittently Type of Home: House Home Access: Stairs to  enter Entrance Stairs-Rails: None Entrance Stairs-Number of Steps: 1   Home Layout: One level Home Equipment: Agricultural consultant (2 wheels);Cane - quad;BSC/3in1;Shower seat;Grab bars - tub/shower;Grab bars - toilet;Lift chair Additional Comments: Pt reports same living history form previous admission.    Prior Function Prior Level of Function : Independent/Modified Independent;Needs assist;History of Falls (last six months)       Physical Assist : ADLs (physical);Mobility (physical) Mobility (physical): Bed mobility;Transfers;Gait;Stairs   Mobility Comments: RW for community and household ambulation. ADLs Comments: Independent ADL; assist IADL from family     Extremity/Trunk Assessment   Upper Extremity Assessment Upper Extremity Assessment: Defer to OT evaluation RUE Deficits / Details: 3-/5 baseline shoulder issue. Generally weak otherwise. LUE Deficits / Details: 2+/5 shoulder flexion from baseline deficits; generally weak otherwise.    Lower Extremity Assessment Lower Extremity Assessment: Generalized weakness    Cervical / Trunk Assessment Cervical / Trunk Assessment: Kyphotic  Communication   Communication Communication: No apparent difficulties    Cognition Arousal: Alert Behavior During Therapy: WFL for tasks assessed/performed   PT - Cognitive impairments: No apparent impairments                         Following commands: Intact       Cueing Cueing Techniques: Verbal cues, Tactile cues     General Comments General comments (skin integrity, edema, etc.): Labored breathing observed when ambulating to the transport chair.    Exercises     Assessment/Plan    PT Assessment Patient needs continued PT services  PT Problem List Decreased strength;Decreased activity tolerance;Decreased mobility;Decreased balance       PT Treatment Interventions DME instruction;Gait training;Functional mobility training;Therapeutic activities;Therapeutic  exercise;Balance training;Patient/family education;Stair training    PT Goals (Current goals can be found in the Care Plan section)  Acute Rehab PT Goals Patient Stated Goal: return home with family to assist PT Goal Formulation: With patient Time For Goal Achievement: 01/28/24 Potential to Achieve Goals: Good    Frequency Min 3X/week     Co-evaluation PT/OT/SLP Co-Evaluation/Treatment: Yes Reason for Co-Treatment: To address functional/ADL transfers PT goals addressed during session: Mobility/safety with mobility;Balance;Proper use of DME OT goals addressed during session: ADL's and self-care       AM-PAC PT "6 Clicks" Mobility  Outcome Measure Help needed turning from your back to your side while in a flat bed without using bedrails?: A Little Help needed moving from lying on your back to sitting on the side of a flat bed without using bedrails?: A Little Help needed moving to and from a bed to a chair (including a wheelchair)?: A Lot Help needed standing up from a chair using your arms (e.g., wheelchair or bedside chair)?: A Lot Help needed to walk in hospital room?: A Lot Help needed climbing 3-5 steps with a railing? : A Lot 6 Click Score: 14    End of Session   Activity Tolerance: Patient tolerated treatment well;Patient limited by fatigue Patient left: in chair;with call bell/phone within reach Nurse Communication: Mobility status PT Visit Diagnosis: Unsteadiness on feet (R26.81);Other abnormalities of gait and mobility (R26.89);Muscle weakness (generalized) (M62.81)    Time: 1610-9604 PT Time Calculation (min) (ACUTE ONLY): 22 min   Charges:   PT Evaluation $PT Eval Moderate Complexity: 1 Mod PT Treatments $Therapeutic Activity: 8-22 mins  PT General Charges $$ ACUTE PT VISIT: 1 Visit         3:01 PM, 01/14/24 Walton Guppy, MPT Physical Therapist with [facility] 336 [phone] office [phone] mobile phone

## 2024-01-14 NOTE — ED Notes (Signed)
 Patient has not gotten linzess  due to pharmacy not bringing it down to ED yet.

## 2024-01-14 NOTE — ED Notes (Signed)
 ED TO INPATIENT HANDOFF REPORT  ED Nurse Name and Phone #:   S Name/Age/Gender Marie [name] 83 y.o. female Room/Bed: APA07/APA07  Code Status   Code Status: Full Code  Home/SNF/Other Home Patient oriented to: self, place, time, and situation Is this baseline? Yes   Triage Complete: Triage complete  Chief Complaint Pneumonia [J18.9]  Triage Note L rib pain. Pt states she "wants to be scanned from L shoulder down to L groin. Pt denies any injury. Pt was just seen at [facility] in [place]. Pt with recurrent UTI's and currently on antibiotics for it. Pt states she hasn't been able to eat or drink per usual.    Allergies Allergies  Allergen Reactions   Codeine Hives and Swelling   Promethazine Hcl Swelling    Level of Care/Admitting Diagnosis ED Disposition     ED Disposition  Admit   Condition  --   Comment  Hospital Area: [facility] [[id number]]  Level of Care: Telemetry [5]  Covid Evaluation: Asymptomatic - no recent exposure (last 10 days) testing not required  Diagnosis: Pneumonia [227785]  Admitting Physician: Walton Guppy [[id number]]  Attending Physician: Walton Guppy [[id number]]          B Medical/Surgery History Past Medical History:  Diagnosis Date   Anemia    Collagenous colitis    colonoscopy 2009   Depression    Fibromyalgia    GERD (gastroesophageal reflux disease)    Hiatal hernia 2008   small   Hypercholesterolemia    Hypertension    IBS (irritable bowel syndrome)    Lymphocytic colitis    colonoscopy 2007, TCS 2013   S/P endoscopy Sept 2008   small hh, s/p 67 French dilator   Past Surgical History:  Procedure Laterality Date   ABDOMINAL HYSTERECTOMY     BIOPSY  01/09/2017   Procedure: BIOPSY;  Surgeon: Suzette Espy, MD;  [facility];  Service: Endoscopy;;  colon   CATARACT EXTRACTION Bilateral 2021   COLONOSCOPY  04/13/2008   Dr.Rourk- anal papilla and internal hemorrhage otherwise normal /left side  diverticula   COLONOSCOPY  09/20/2011   Dr.Rourk- Adequate preparation. Internal hemorrhoids; otherwise normal/ Left-sided diverticula, biopsies positive for lymphocytic colitis   COLONOSCOPY N/A 01/09/2017   Procedure: COLONOSCOPY;  Surgeon: Suzette Espy, MD;  Location: [facility];  Service: Endoscopy;  Laterality: N/A;  7:30am   ESOPHAGOGASTRODUODENOSCOPY  04/22/2007   Dr.Rourk- normal esophagus, small hiatal hernia o/w normal stomach, D1 and D2 s/p passage of a 56-French maloney dilator   fistula of colon     FLEXIBLE SIGMOIDOSCOPY  05/29/2006   Dr.Rehman- No evidence of pseudomembranous or acute colitis.   FOOT SURGERY     KNEE SURGERY     NOSE SURGERY     VEIN SURGERY Bilateral 2021     A IV Location/Drains/Wounds Patient Lines/Drains/Airways Status     Active Line/Drains/Airways     Name Placement date Placement time Site Days   Peripheral IV 01/14/24 22 G 1" Left Hand 01/14/24  0154  Hand  less than 1            Intake/Output Last 24 hours No intake or output data in the 24 hours ending 01/14/24 0202  Labs/Imaging Results for orders placed or performed during the hospital encounter of 01/13/24 (from the past 48 hours)  CBC with Differential     Status: Abnormal   Collection Time: 01/13/24  8:50 PM  Result Value Ref Range  WBC 6.4 4.0 - 10.5 K/uL   RBC 3.00 (L) 3.87 - 5.11 MIL/uL   Hemoglobin 9.0 (L) 12.0 - 15.0 g/dL   HCT 78.2 (L) 95.6 - 21.3 %   MCV 88.7 80.0 - 100.0 fL   MCH 30.0 26.0 - 34.0 pg   MCHC 33.8 30.0 - 36.0 g/dL   RDW 08.6 57.8 - 46.9 %   Platelets 252 150 - 400 K/uL   nRBC 0.0 0.0 - 0.2 %   Neutrophils Relative % 61 %   Neutro Abs 3.9 1.7 - 7.7 K/uL   Lymphocytes Relative 19 %   Lymphs Abs 1.2 0.7 - 4.0 K/uL   Monocytes Relative 11 %   Monocytes Absolute 0.7 0.1 - 1.0 K/uL   Eosinophils Relative 8 %   Eosinophils Absolute 0.5 0.0 - 0.5 K/uL   Basophils Relative 1 %   Basophils Absolute 0.0 0.0 - 0.1 K/uL   Immature Granulocytes 0 %    Abs Immature Granulocytes 0.02 0.00 - 0.07 K/uL    Comment: Performed at [facility], [address], [place], [place] [address]  Comprehensive metabolic panel     Status: Abnormal   Collection Time: 01/13/24  8:50 PM  Result Value Ref Range   Sodium 135 135 - 145 mmol/L   Potassium 3.4 (L) 3.5 - 5.1 mmol/L   Chloride 102 98 - 111 mmol/L   CO2 23 22 - 32 mmol/L   Glucose, Bld 141 (H) 70 - 99 mg/dL    Comment: Glucose reference range applies only to samples taken after fasting for at least 8 hours.   BUN 26 (H) 8 - 23 mg/dL   Creatinine, Ser 8.41 (H) 0.44 - 1.00 mg/dL   Calcium 9.3 8.9 - 32.4 mg/dL   Total Protein 6.2 (L) 6.5 - 8.1 g/dL   Albumin 2.5 (L) 3.5 - 5.0 g/dL   AST 21 15 - 41 U/L   ALT 19 0 - 44 U/L   Alkaline Phosphatase 59 38 - 126 U/L   Total Bilirubin 0.3 0.0 - 1.2 mg/dL   GFR, Estimated 54 (L) >60 mL/min    Comment: (NOTE) Calculated using the CKD-EPI Creatinine Equation (2021)    Anion gap 10 5 - 15    Comment: Performed at [facility], [address] [place], [place] [address]  Brain natriuretic peptide     Status: Abnormal   Collection Time: 01/13/24  8:50 PM  Result Value Ref Range   B Natriuretic Peptide 148.0 (H) 0.0 - 100.0 pg/mL    Comment: Performed at [facility], [address], [place], [place] [address]  Troponin I (High Sensitivity)     Status: None   Collection Time: 01/13/24  8:55 PM  Result Value Ref Range   Troponin I (High Sensitivity) 5 <18 ng/L    Comment: (NOTE) Elevated high sensitivity troponin I (hsTnI) values and significant  changes across serial measurements may suggest ACS but many other  chronic and acute conditions are known to elevate hsTnI results.  Refer to the "Links" section for chest pain algorithms and additional  guidance. Performed at [facility], [address], [place], [place] [address]   Troponin I (High Sensitivity)     Status: None   Collection Time: 01/13/24 10:31 PM  Result Value Ref Range   Troponin I (High  Sensitivity) 4 <18 ng/L    Comment: (NOTE) Elevated high sensitivity troponin I (hsTnI) values and significant  changes across serial measurements may suggest ACS but many other  chronic and acute conditions are known to elevate hsTnI results.  Refer to the "Links" section for chest pain algorithms and additional  guidance. Performed at [facility], [address] [place], [place] [address]   Urinalysis, Routine w reflex microscopic -Urine, Clean Catch     Status: Abnormal   Collection Time: 01/13/24 11:00 PM  Result Value Ref Range   Color, Urine STRAW (A) YELLOW   APPearance CLEAR CLEAR   Specific Gravity, Urine 1.028 1.005 - 1.030   pH 6.0 5.0 - 8.0   Glucose, UA NEGATIVE NEGATIVE mg/dL   Hgb urine dipstick NEGATIVE NEGATIVE   Bilirubin Urine NEGATIVE NEGATIVE   Ketones, ur NEGATIVE NEGATIVE mg/dL   Protein, ur NEGATIVE NEGATIVE mg/dL   Nitrite NEGATIVE NEGATIVE   Leukocytes,Ua TRACE (A) NEGATIVE   RBC / HPF 0-5 0 - 5 RBC/hpf   WBC, UA 11-20 0 - 5 WBC/hpf   Bacteria, UA NONE SEEN NONE SEEN   Squamous Epithelial / HPF 0-5 0 - 5 /HPF    Comment: Performed at [facility], [address] [place], [place] [address]  Wet prep, genital     Status: None   Collection Time: 01/13/24 11:40 PM   Specimen: PATH Cytology Cervicovaginal Ancillary Only  Result Value Ref Range   Yeast Wet Prep HPF POC NONE SEEN NONE SEEN   Trich, Wet Prep NONE SEEN NONE SEEN   Clue Cells Wet Prep HPF POC NONE SEEN NONE SEEN   WBC, Wet Prep HPF POC <10 <10   Sperm NONE SEEN     Comment: Performed at [facility], [address] [place], [place] [address]   CT Angio Chest/Abd/Pel for Dissection W and/or Wo Contrast Result Date: 01/13/2024 CLINICAL DATA:  Left rib pain. Recurrent UTIs. Unable to eat or drink. Acute aortic syndrome suspected EXAM: CT ANGIOGRAPHY CHEST, ABDOMEN AND PELVIS TECHNIQUE: Non-contrast CT of the chest was initially obtained. Multidetector CT imaging through the chest, abdomen and  pelvis was performed using the standard protocol during bolus administration of intravenous contrast. Multiplanar reconstructed images and MIPs were obtained and reviewed to evaluate the vascular anatomy. RADIATION DOSE REDUCTION: This exam was performed according to the departmental dose-optimization program which includes automated exposure control, adjustment of the mA and/or kV according to patient size and/or use of iterative reconstruction technique. CONTRAST:  OMNIPAQUE  IOHEXOL  350 MG/ML SOLN COMPARISON:  CT abdomen pelvis 04/24/2023 and CTA chest 05/28/2022 FINDINGS: CTA CHEST FINDINGS Cardiovascular: No evidence of acute aortic syndrome. Coronary artery and aortic atherosclerotic calcification. Trace pericardial effusion. Normal heart size. Mediastinum/Nodes: Moderate hiatal hernia. Debris in the esophagus reaching near the thoracic inlet. Trachea is patent. No thoracic adenopathy. Lungs/Pleura: Patchy ground-glass opacities in the upper lobes. Diffuse interlobular septal thickening greatest in the lower lobes. Bronchial wall thickening greatest in the lower lobes. Small bilateral pleural effusions. No pneumothorax. Musculoskeletal: Age indeterminate nondisplaced fracture of the right glenoid (series 9/image 49). Degenerative arthritis both shoulders. Review of the MIP images confirms the above findings. CTA ABDOMEN AND PELVIS FINDINGS VASCULAR Calcified atherosclerotic plaque in the aorta and its mesenteric, renal, and iliac artery branches without aneurysm or dissection. There is mild narrowing at the origins of the celiac axis and SMA. Moderate to severe narrowing at the origins of the bilateral renal arteries. Review of the MIP images confirms the above findings. NON-VASCULAR Hepatobiliary: Unremarkable. Pancreas: Unremarkable. Spleen: Unremarkable. Adrenals/Urinary Tract: Stable adrenal glands. Large right renal cyst does not require follow-up. Bilateral cortical renal scarring. No urinary  calculi  or hydronephrosis. Unremarkable bladder. Stomach/Bowel: Normal caliber large and small bowel. Colonic diverticulosis without diverticulitis. Moderate hiatal hernia. Normal appendix. Lymphatic: No lymphadenopathy. Reproductive: Hysterectomy. Fluid in the vagina with vaginal wall thickening and hyperenhancement. Correlate for vaginitis. No adnexal mass. Other: No free intraperitoneal fluid or air. Redemonstrated right-sided spigelian hernia containing nonobstructed small bowel. Musculoskeletal: No acute fracture. Demineralization. Thoracolumbar spondylosis. Review of the MIP images confirms the above findings. IMPRESSION: 1. No evidence of acute aortic syndrome. 2. Patchy ground-glass opacities in the upper lobes with diffuse interlobular septal thickening may be due to atypical infection or edema. 3. Age indeterminate nondisplaced fracture of the right glenoid. 4. Moderate hiatal hernia with debris in the esophagus reaching near the thoracic inlet. 5. Fluid in the vagina with vaginal wall thickening and hyperenhancement. Correlate for vaginitis. 6. 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 7. Aortic Atherosclerosis (ICD10-I70.0). Electronically Signed   By: Rozell Cornet M.D.   On: 01/13/2024 22:38    Pending Labs Unresulted Labs (From admission, onward)     Start     Ordered   01/21/24 0500  Creatinine, serum  (enoxaparin  (LOVENOX )    CrCl >/= 30 ml/min)  Weekly,   R     Comments: while on enoxaparin  therapy    01/14/24 0201   01/14/24 0500  Procalcitonin  Tomorrow morning,   R       References:    Procalcitonin Lower Respiratory Tract Infection AND Sepsis Procalcitonin Algorithm   01/14/24 0201   01/14/24 0500  Basic metabolic panel  Daily,   R      01/14/24 0201   01/14/24 0500  Magnesium   Tomorrow morning,   R        01/14/24 0201   01/14/24 0500  CBC  Tomorrow morning,   R        01/14/24 0201   01/14/24 0201  Strep pneumoniae urinary antigen   (COPD / Pneumonia / Cellulitis / Lower Extremity Wound (Diabetic Foot Infection))  Add-on,   AD        01/14/24 0201   01/14/24 0201  Legionella Pneumophila Serogp 1 Ur Ag  (COPD / Pneumonia / Cellulitis / Lower Extremity Wound (Diabetic Foot Infection))  Add-on,   AD        01/14/24 0201            Vitals/Pain Today's Vitals   01/13/24 2330 01/14/24 0000 01/14/24 0030 01/14/24 0200  BP: (!) 149/67 (!) 166/65 (!) 152/68 (!) 156/57  Pulse: 68 65 62 61  Resp: 18 17 12 17   Temp: 98 F (36.7 C)  98.3 F (36.8 C)   TempSrc:   Oral   SpO2: 97% 97% 94% 95%  Weight:      Height:      PainSc:        Isolation Precautions No active isolations  Medications Medications  cefTRIAXone  (ROCEPHIN ) 1 g in sodium chloride  0.9 % 100 mL IVPB (1 g Intravenous New Bag/Given 01/14/24 0154)  azithromycin  (ZITHROMAX ) 500 mg in sodium chloride  0.9 % 250 mL IVPB (has no administration in time range)  traMADol  (ULTRAM ) tablet 50 mg (has no administration in time range)  hydrochlorothiazide (HYDRODIURIL) tablet 12.5 mg (has no administration in time range)  losartan  (COZAAR ) tablet 50 mg (has no administration in time range)  citalopram  (CELEXA ) tablet 40 mg (has no administration in time range)  pantoprazole  (PROTONIX ) EC tablet 40 mg (has no administration in time range)  linaclotide  (LINZESS ) capsule 72 mcg (has no  administration in time range)  enoxaparin  (LOVENOX ) injection 40 mg (has no administration in time range)  cefTRIAXone  (ROCEPHIN ) 2 g in sodium chloride  0.9 % 100 mL IVPB (has no administration in time range)  azithromycin  (ZITHROMAX ) tablet 500 mg (has no administration in time range)  sodium chloride  flush (NS) 0.9 % injection 3 mL (has no administration in time range)  potassium chloride  SA (KLOR-CON  M) CR tablet 10 mEq (has no administration in time range)  acetaminophen  (TYLENOL ) tablet 650 mg (has no administration in time range)    Or  acetaminophen  (TYLENOL ) suppository 650 mg  (has no administration in time range)  senna-docusate (Senokot-S) tablet 1 tablet (has no administration in time range)  ondansetron  (ZOFRAN ) tablet 4 mg (has no administration in time range)    Or  ondansetron  (ZOFRAN ) injection 4 mg (has no administration in time range)  iohexol  (OMNIPAQUE ) 350 MG/ML injection 100 mL (100 mLs Intravenous Contrast Given 01/13/24 2212)    Mobility walks with device     Focused Assessments    R Recommendations: See Admitting Provider Note  Report given to:   Additional Notes:

## 2024-01-14 NOTE — Progress Notes (Signed)
 PROGRESS NOTE    Marie [name]  ZOX:[id number] DOB: [birth year] DOA: 01/13/2024 PCP: [facility]   Brief Narrative:    Marie [name] is an 83 y.o. female with medical history significant for depression, anxiety, fibromyalgia, hypertension, IBS, recurrent UTIs, and CKD 3A who presents with pain in her left side, increased fatigue, cough, and exertional dyspnea.  Patient has been admitted with community-acquired pneumonia.  Started on fall precautions with PT evaluation pending due to physical debility.  Assessment & Plan:   Principal Problem:   Pneumonia Active Problems:   Depression with anxiety   Essential hypertension   CKD stage 3a, GFR 45-59 ml/min (HCC)  Assessment and Plan:  1. Pneumonia  - Check strep pneumo and legionella antigens, procalcitonin minimally elevated, continue Rocephin  and azithromycin     2. Left flank pain   - Atraumatic, worse with movement and palpation, no acute findings noted on CT  - Likely musculoskeletal, will continue pain-control    3. Physical debility  - Presents with increased fatigue and general weakness, no longer able to perform ADLs  - No focal deficits identified  - Treat suspected pneumonia, consult PT     4. Hypertension  - Continue hydrochlorothiazide and losartan      5. Depression, anxiety  - Continue Celexa      6. CKD 3A  - Appears close to baseline  - Renally-dose medications, monitor   7. Anemia  - Appears stable    DVT prophylaxis:Lovenox  Code Status: Full Family Communication: None at bedside Disposition Plan:  Status is: Observation The patient remains OBS appropriate and will d/c before 2 midnights.   Consultants:  None  Procedures:  None  Antimicrobials:  Anti-infectives (From admission, onward)    Start     Dose/Rate Route Frequency Ordered Stop   01/15/24 0115  cefTRIAXone  (ROCEPHIN ) 2 g in sodium chloride  0.9 % 100 mL IVPB        2 g 200 mL/hr over 30 Minutes Intravenous  Every 24 hours 01/14/24 0201 01/19/24 0114   01/15/24 0115  azithromycin  (ZITHROMAX ) tablet 500 mg        500 mg Oral Daily 01/14/24 0201 01/19/24 0959   01/14/24 0115  cefTRIAXone  (ROCEPHIN ) 1 g in sodium chloride  0.9 % 100 mL IVPB        1 g 200 mL/hr over 30 Minutes Intravenous  Once 01/14/24 0105 01/14/24 0241   01/14/24 0115  azithromycin  (ZITHROMAX ) 500 mg in sodium chloride  0.9 % 250 mL IVPB        500 mg 250 mL/hr over 60 Minutes Intravenous  Once 01/14/24 0105 01/14/24 0347   01/14/24 0000  amoxicillin-clavulanate (AUGMENTIN) 875-125 MG per tablet 1 tablet  Status:  Discontinued        1 tablet Oral  Once 01/13/24 2352 01/14/24 0105   01/13/24 0000  amoxicillin-clavulanate (AUGMENTIN) 875-125 MG tablet        1 tablet Oral Every 12 hours 01/13/24 2352         Subjective: Patient seen and evaluated today with no new acute complaints or concerns. No acute concerns or events noted overnight.  Objective: Vitals:   01/14/24 0937 01/14/24 0938 01/14/24 1000 01/14/24 1036  BP: (!) 171/56  (!) 182/59   Pulse: 60 62 (!) 57   Resp: 18 (!) 25 19   Temp:      TempSrc:      SpO2: 100% 100% 93%   Weight:    60 kg  Height:  Intake/Output Summary (Last 24 hours) at 01/14/2024 1048 Last data filed at 01/14/2024 0347 Gross per 24 hour  Intake 350 ml  Output --  Net 350 ml   Filed Weights   01/13/24 1920 01/14/24 1036  Weight: 61 kg 60 kg    Examination:  General exam: Appears calm and comfortable  Respiratory system: Clear to auscultation. Respiratory effort normal.  Currently on room air. Cardiovascular system: S1 & S2 heard, RRR.  Gastrointestinal system: Abdomen is soft Central nervous system: Alert and awake Extremities: No edema Skin: No significant lesions noted Psychiatry: Flat affect.    Data Reviewed: I have personally reviewed following labs and imaging studies  CBC: Recent Labs  Lab 01/13/24 2050 01/14/24 0521  WBC 6.4 5.5  NEUTROABS 3.9  --    HGB 9.0* 8.9*  HCT 26.6* 27.0*  MCV 88.7 87.4  PLT 252 229   Basic Metabolic Panel: Recent Labs  Lab 01/13/24 2050 01/14/24 0521  NA 135 136  K 3.4* 3.4*  CL 102 104  CO2 23 24  GLUCOSE 141* 111*  BUN 26* 20  CREATININE 1.04* 0.90  CALCIUM 9.3 9.1  MG  --  1.5*   GFR: Estimated Creatinine Clearance: 42.3 mL/min (by C-G formula based on SCr of 0.9 mg/dL). Liver Function Tests: Recent Labs  Lab 01/13/24 2050  AST 21  ALT 19  ALKPHOS 59  BILITOT 0.3  PROT 6.2*  ALBUMIN 2.5*   No results for input(s): "LIPASE", "AMYLASE" in the last 168 hours. No results for input(s): "AMMONIA" in the last 168 hours. Coagulation Profile: No results for input(s): "INR", "PROTIME" in the last 168 hours. Cardiac Enzymes: No results for input(s): "CKTOTAL", "CKMB", "CKMBINDEX", "TROPONINI" in the last 168 hours. BNP (last 3 results) No results for input(s): "PROBNP" in the last 8760 hours. HbA1C: No results for input(s): "HGBA1C" in the last 72 hours. CBG: No results for input(s): "GLUCAP" in the last 168 hours. Lipid Profile: No results for input(s): "CHOL", "HDL", "LDLCALC", "TRIG", "CHOLHDL", "LDLDIRECT" in the last 72 hours. Thyroid  Function Tests: No results for input(s): "TSH", "T4TOTAL", "FREET4", "T3FREE", "THYROIDAB" in the last 72 hours. Anemia Panel: No results for input(s): "VITAMINB12", "FOLATE", "FERRITIN", "TIBC", "IRON", "RETICCTPCT" in the last 72 hours. Sepsis Labs: Recent Labs  Lab 01/14/24 0521  PROCALCITON 0.56    Recent Results (from the past 240 hours)  Wet prep, genital     Status: None   Collection Time: 01/13/24 11:40 PM   Specimen: PATH Cytology Cervicovaginal Ancillary Only  Result Value Ref Range Status   Yeast Wet Prep HPF POC NONE SEEN NONE SEEN Final   Trich, Wet Prep NONE SEEN NONE SEEN Final   Clue Cells Wet Prep HPF POC NONE SEEN NONE SEEN Final   WBC, Wet Prep HPF POC <10 <10 Final   Sperm NONE SEEN  Final    Comment: Performed at [facility], [address] [place], [place] [address]         Radiology Studies: CT Angio Chest/Abd/Pel for Dissection W and/or Wo Contrast Result Date: 01/13/2024 CLINICAL DATA:  Left rib pain. Recurrent UTIs. Unable to eat or drink. Acute aortic syndrome suspected EXAM: CT ANGIOGRAPHY CHEST, ABDOMEN AND PELVIS TECHNIQUE: Non-contrast CT of the chest was initially obtained. Multidetector CT imaging through the chest, abdomen and pelvis was performed using the standard protocol during bolus administration of intravenous contrast. Multiplanar reconstructed images and MIPs were obtained and reviewed to evaluate the vascular anatomy. RADIATION DOSE REDUCTION: This exam was  performed according to the departmental dose-optimization program which includes automated exposure control, adjustment of the mA and/or kV according to patient size and/or use of iterative reconstruction technique. CONTRAST:  OMNIPAQUE  IOHEXOL  350 MG/ML SOLN COMPARISON:  CT abdomen pelvis 04/24/2023 and CTA chest 05/28/2022 FINDINGS: CTA CHEST FINDINGS Cardiovascular: No evidence of acute aortic syndrome. Coronary artery and aortic atherosclerotic calcification. Trace pericardial effusion. Normal heart size. Mediastinum/Nodes: Moderate hiatal hernia. Debris in the esophagus reaching near the thoracic inlet. Trachea is patent. No thoracic adenopathy. Lungs/Pleura: Patchy ground-glass opacities in the upper lobes. Diffuse interlobular septal thickening greatest in the lower lobes. Bronchial wall thickening greatest in the lower lobes. Small bilateral pleural effusions. No pneumothorax. Musculoskeletal: Age indeterminate nondisplaced fracture of the right glenoid (series 9/image 49). Degenerative arthritis both shoulders. Review of the MIP images confirms the above findings. CTA ABDOMEN AND PELVIS FINDINGS VASCULAR Calcified atherosclerotic plaque in the aorta and its mesenteric, renal, and iliac artery branches without aneurysm or  dissection. There is mild narrowing at the origins of the celiac axis and SMA. Moderate to severe narrowing at the origins of the bilateral renal arteries. Review of the MIP images confirms the above findings. NON-VASCULAR Hepatobiliary: Unremarkable. Pancreas: Unremarkable. Spleen: Unremarkable. Adrenals/Urinary Tract: Stable adrenal glands. Large right renal cyst does not require follow-up. Bilateral cortical renal scarring. No urinary calculi or hydronephrosis. Unremarkable bladder. Stomach/Bowel: Normal caliber large and small bowel. Colonic diverticulosis without diverticulitis. Moderate hiatal hernia. Normal appendix. Lymphatic: No lymphadenopathy. Reproductive: Hysterectomy. Fluid in the vagina with vaginal wall thickening and hyperenhancement. Correlate for vaginitis. No adnexal mass. Other: No free intraperitoneal fluid or air. Redemonstrated right-sided spigelian hernia containing nonobstructed small bowel. Musculoskeletal: No acute fracture. Demineralization. Thoracolumbar spondylosis. Review of the MIP images confirms the above findings. IMPRESSION: 1. No evidence of acute aortic syndrome. 2. Patchy ground-glass opacities in the upper lobes with diffuse interlobular septal thickening may be due to atypical infection or edema. 3. Age indeterminate nondisplaced fracture of the right glenoid. 4. Moderate hiatal hernia with debris in the esophagus reaching near the thoracic inlet. 5. Fluid in the vagina with vaginal wall thickening and hyperenhancement. Correlate for vaginitis. 6. 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 7. Aortic Atherosclerosis (ICD10-I70.0). Electronically Signed   By: Rozell Cornet M.D.   On: 01/13/2024 22:38     Scheduled Meds:  [START ON 01/15/2024] azithromycin   500 mg Oral Daily   Chlorhexidine Gluconate Cloth  6 each Topical Q0600   citalopram   40 mg Oral Daily   enoxaparin  (LOVENOX ) injection  40 mg Subcutaneous Q24H    hydrochlorothiazide  12.5 mg Oral Daily   linaclotide   72 mcg Oral QAC breakfast   losartan   50 mg Oral Daily   pantoprazole   40 mg Oral Daily   sodium chloride  flush  3 mL Intravenous Q12H   Continuous Infusions:  [START ON 01/15/2024] cefTRIAXone  (ROCEPHIN )  IV       LOS: 0 days    Time spent: 55 minutes    Eliese Kerwood Loran Rock, DO [facility]  If 7PM-7AM, please contact night-coverage [email or web address] 01/14/2024, 10:48 AM

## 2024-01-15 DIAGNOSIS — J189 Pneumonia, unspecified organism: Secondary | ICD-10-CM | POA: Diagnosis not present

## 2024-01-15 LAB — CBC
HCT: 29 % — ABNORMAL LOW (ref 36.0–46.0)
Hemoglobin: 9.2 g/dL — ABNORMAL LOW (ref 12.0–15.0)
MCH: 28.3 pg (ref 26.0–34.0)
MCHC: 31.7 g/dL (ref 30.0–36.0)
MCV: 89.2 fL (ref 80.0–100.0)
Platelets: 228 10*3/uL (ref 150–400)
RBC: 3.25 MIL/uL — ABNORMAL LOW (ref 3.87–5.11)
RDW: 13 % (ref 11.5–15.5)
WBC: 5.5 10*3/uL (ref 4.0–10.5)
nRBC: 0 % (ref 0.0–0.2)

## 2024-01-15 LAB — BASIC METABOLIC PANEL WITH GFR
Anion gap: 7 (ref 5–15)
BUN: 15 mg/dL (ref 8–23)
CO2: 26 mmol/L (ref 22–32)
Calcium: 9.3 mg/dL (ref 8.9–10.3)
Chloride: 103 mmol/L (ref 98–111)
Creatinine, Ser: 0.76 mg/dL (ref 0.44–1.00)
GFR, Estimated: >60 mL/min (ref >60)
Glucose, Bld: 106 mg/dL — ABNORMAL HIGH (ref 70–99)
Potassium: 3.7 mmol/L (ref 3.5–5.1)
Sodium: 136 mmol/L (ref 135–145)

## 2024-01-15 LAB — MAGNESIUM: Magnesium: 1.7 mg/dL (ref 1.7–2.4)

## 2024-01-15 LAB — GC/CHLAMYDIA PROBE AMP (~~LOC~~) NOT AT ARMC
Chlamydia: NEGATIVE
Comment: NEGATIVE
Comment: NORMAL
Neisseria Gonorrhea: NEGATIVE

## 2024-01-15 MED ORDER — TRAMADOL HCL 50 MG PO TABS: 50.0000 mg | ORAL_TABLET | Freq: Two times a day (BID) | ORAL | 0 refills | Status: AC | PRN

## 2024-01-15 MED ORDER — AMOXICILLIN-POT CLAVULANATE 875-125 MG PO TABS: 1.0000 | ORAL_TABLET | Freq: Two times a day (BID) | ORAL | 0 refills | Status: AC

## 2024-01-15 MED ORDER — CLONAZEPAM 1 MG PO TABS
1.0000 mg | ORAL_TABLET | Freq: Two times a day (BID) | ORAL | 0 refills | Status: AC | PRN
Start: 2024-01-15 — End: ?

## 2024-01-15 NOTE — Discharge Summary (Signed)
 Physician Discharge Summary  Marie [name] ZOX:[id number] DOB: Mar 01, 1941 DOA: 01/13/2024  PCP: [facility]  Admit date: 01/13/2024  Discharge date: 01/15/2024  Admitted From:Home  Disposition:  SNF  Recommendations for Outpatient Follow-up:  Follow up with PCP in 1-2 weeks Continue antibiotics with Augmentin as prescribed for 4 more days to complete course of treatment for pneumonia Continue other medications as noted below  Home Health: None  Equipment/Devices: None  Discharge Condition:Stable  CODE STATUS: Full  Diet recommendation: Heart Healthy  Brief/Interim Summary: Marie [name] is an 83 y.o. female with medical history significant for depression, anxiety, fibromyalgia, hypertension, IBS, recurrent UTIs, and CKD 3A who presents with pain in her left side, increased fatigue, cough, and exertional dyspnea.  Patient has been admitted with community-acquired pneumonia.  Started on fall precautions with PT evaluation pending due to physical debility.  PT now recommending SNF/rehab and patient is in stable condition to discharge with improvement in pneumonia noted.  She is able to transition oral medications today.  No other acute events or concerns noted throughout the course of this admission.  Discharge Diagnoses:  Principal Problem:   Pneumonia Active Problems:   Depression with anxiety   Essential hypertension   CKD stage 3a, GFR 45-59 ml/min (HCC)  Principal discharge diagnosis: Falls with worsening physical debility and community-acquired pneumonia.  Discharge Instructions  Discharge Instructions     Diet - low sodium heart healthy   Complete by: As directed    Increase activity slowly   Complete by: As directed       Allergies as of 01/15/2024       Reactions   Codeine Hives, Swelling   Promethazine Hcl Swelling        Medication List     STOP taking these medications    levofloxacin 750 MG tablet Commonly known as:  LEVAQUIN   sulfamethoxazole-trimethoprim 800-160 MG tablet Commonly known as: BACTRIM DS       TAKE these medications    acetaminophen  500 MG tablet Commonly known as: TYLENOL  Take 2 tablets (1,000 mg total) by mouth every 8 (eight) hours as needed for mild pain (pain score 1-3), fever or headache.   amoxicillin-clavulanate 875-125 MG tablet Commonly known as: AUGMENTIN Take 1 tablet by mouth every 12 (twelve) hours for 4 days. What changed: when to take this   CALCIUM PO Take 1 tablet by mouth every morning.   CENTRUM SILVER ULTRA WOMENS PO Take 1 tablet by mouth every morning.   citalopram  40 MG tablet Commonly known as: CELEXA  Take 40 mg by mouth daily.   clonazePAM  1 MG tablet Commonly known as: KlonoPIN  Take 1 tablet (1 mg total) by mouth 2 (two) times daily as needed for anxiety.   cyanocobalamin  1000 MCG tablet Commonly known as: VITAMIN B12 Take 1,000 mcg by mouth daily.   diclofenac  Sodium 1 % Gel Commonly known as: VOLTAREN  Apply 4 g topically 4 (four) times daily as needed (joint and musculoskeletal pain). Apply to affected areas   esomeprazole  40 MG capsule Commonly known as: NEXIUM  Take 1 capsule (40 mg total) by mouth 2 (two) times daily before a meal.   Fish Oil 1000 MG Caps Take 2 capsules by mouth daily.   hydrochlorothiazide 12.5 MG tablet Commonly known as: HYDRODIURIL Take 12.5 mg by mouth daily.   hyoscyamine  0.125 MG tablet Commonly known as: LEVSIN Take 0.125 mg by mouth every 8 (eight) hours as needed for cramping.   linaclotide  72 MCG  capsule Commonly known as: Linzess  Take 1 capsule (72 mcg total) by mouth daily before breakfast.   loratadine  10 MG tablet Commonly known as: CLARITIN  Take 1 tablet (10 mg total) by mouth daily.   losartan  50 MG tablet Commonly known as: COZAAR  Take 50 mg by mouth daily.   nitrofurantoin 100 MG capsule Commonly known as: MACRODANTIN Take 100 mg by mouth at bedtime.   traMADol  50 MG  tablet Commonly known as: ULTRAM  Take 1 tablet (50 mg total) by mouth every 12 (twelve) hours as needed for severe pain (pain score 7-10).   VITAMIN C PO Take 1 tablet by mouth every morning.        Contact information for follow-up providers     your gastroenterologist. Schedule an appointment as soon as possible for a visit in 2 weeks.   Why: For follow-up        your orthopedic doctor. Schedule an appointment as soon as possible for a visit in 1 week.   Why: For follow-up        [facility] Schedule an appointment as soon as possible for a visit in 1 week(s).   Contact information: [address] [place] [address] [phone]              Contact information for after-discharge care     Destination     [facility] .   Service: Skilled Nursing Contact information: [address] [place]  [address] [phone]                    Allergies  Allergen Reactions   Codeine Hives and Swelling   Promethazine Hcl Swelling    Consultations: None   Procedures/Studies: CT Angio Chest/Abd/Pel for Dissection W and/or Wo Contrast Result Date: 01/13/2024 CLINICAL DATA:  Left rib pain. Recurrent UTIs. Unable to eat or drink. Acute aortic syndrome suspected EXAM: CT ANGIOGRAPHY CHEST, ABDOMEN AND PELVIS TECHNIQUE: Non-contrast CT of the chest was initially obtained. Multidetector CT imaging through the chest, abdomen and pelvis was performed using the standard protocol during bolus administration of intravenous contrast. Multiplanar reconstructed images and MIPs were obtained and reviewed to evaluate the vascular anatomy. RADIATION DOSE REDUCTION: This exam was performed according to the departmental dose-optimization program which includes automated exposure control, adjustment of the mA and/or kV according to patient size and/or use of iterative reconstruction technique. CONTRAST:   OMNIPAQUE  IOHEXOL  350 MG/ML SOLN COMPARISON:  CT abdomen pelvis 04/24/2023 and CTA chest 05/28/2022 FINDINGS: CTA CHEST FINDINGS Cardiovascular: No evidence of acute aortic syndrome. Coronary artery and aortic atherosclerotic calcification. Trace pericardial effusion. Normal heart size. Mediastinum/Nodes: Moderate hiatal hernia. Debris in the esophagus reaching near the thoracic inlet. Trachea is patent. No thoracic adenopathy. Lungs/Pleura: Patchy ground-glass opacities in the upper lobes. Diffuse interlobular septal thickening greatest in the lower lobes. Bronchial wall thickening greatest in the lower lobes. Small bilateral pleural effusions. No pneumothorax. Musculoskeletal: Age indeterminate nondisplaced fracture of the right glenoid (series 9/image 49). Degenerative arthritis both shoulders. Review of the MIP images confirms the above findings. CTA ABDOMEN AND PELVIS FINDINGS VASCULAR Calcified atherosclerotic plaque in the aorta and its mesenteric, renal, and iliac artery branches without aneurysm or dissection. There is mild narrowing at the origins of the celiac axis and SMA. Moderate to severe narrowing at the origins of the bilateral renal arteries. Review of the MIP images confirms the above findings. NON-VASCULAR Hepatobiliary: Unremarkable. Pancreas: Unremarkable. Spleen: Unremarkable. Adrenals/Urinary Tract: Stable  adrenal glands. Large right renal cyst does not require follow-up. Bilateral cortical renal scarring. No urinary calculi or hydronephrosis. Unremarkable bladder. Stomach/Bowel: Normal caliber large and small bowel. Colonic diverticulosis without diverticulitis. Moderate hiatal hernia. Normal appendix. Lymphatic: No lymphadenopathy. Reproductive: Hysterectomy. Fluid in the vagina with vaginal wall thickening and hyperenhancement. Correlate for vaginitis. No adnexal mass. Other: No free intraperitoneal fluid or air. Redemonstrated right-sided spigelian hernia containing nonobstructed small  bowel. Musculoskeletal: No acute fracture. Demineralization. Thoracolumbar spondylosis. Review of the MIP images confirms the above findings. IMPRESSION: 1. No evidence of acute aortic syndrome. 2. Patchy ground-glass opacities in the upper lobes with diffuse interlobular septal thickening may be due to atypical infection or edema. 3. Age indeterminate nondisplaced fracture of the right glenoid. 4. Moderate hiatal hernia with debris in the esophagus reaching near the thoracic inlet. 5. Fluid in the vagina with vaginal wall thickening and hyperenhancement. Correlate for vaginitis. 6. 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 7. Aortic Atherosclerosis (ICD10-I70.0). Electronically Signed   By: Rozell Cornet M.D.   On: 01/13/2024 22:38     Discharge Exam: Vitals:   01/15/24 0009 01/15/24 0414  BP: (!) 175/65 (!) 149/59  Pulse: 69 71  Resp:    Temp: 98 F (36.7 C) 97.6 F (36.4 C)  SpO2: 96% 93%   Vitals:   01/14/24 1629 01/14/24 1948 01/15/24 0009 01/15/24 0414  BP: (!) 142/54 (!) 150/69 (!) 175/65 (!) 149/59  Pulse: 63 67 69 71  Resp: 17     Temp: 98.1 F (36.7 C) 98.2 F (36.8 C) 98 F (36.7 C) 97.6 F (36.4 C)  TempSrc:  Oral Oral Oral  SpO2: 98% 97% 96% 93%  Weight:      Height:        General: Pt is alert, awake, not in acute distress Cardiovascular: RRR, S1/S2 +, no rubs, no gallops Respiratory: CTA bilaterally, no wheezing, no rhonchi Abdominal: Soft, NT, ND, bowel sounds + Extremities: no edema, no cyanosis    The results of significant diagnostics from this hospitalization (including imaging, microbiology, ancillary and laboratory) are listed below for reference.     Microbiology: Recent Results (from the past 240 hours)  Wet prep, genital     Status: None   Collection Time: 01/13/24 11:40 PM   Specimen: PATH Cytology Cervicovaginal Ancillary Only  Result Value Ref Range Status   Yeast Wet Prep HPF POC NONE SEEN NONE SEEN  Final   Trich, Wet Prep NONE SEEN NONE SEEN Final   Clue Cells Wet Prep HPF POC NONE SEEN NONE SEEN Final   WBC, Wet Prep HPF POC <10 <10 Final   Sperm NONE SEEN  Final    Comment: Performed at [facility], [address], [place], [place] [address]  MRSA Next Gen by PCR, Nasal     Status: None   Collection Time: 01/14/24  9:30 AM   Specimen: Nasal Mucosa; Nasal Swab  Result Value Ref Range Status   MRSA by PCR Next Gen NOT DETECTED NOT DETECTED Final    Comment: (NOTE) The GeneXpert MRSA Assay (FDA approved for NASAL specimens only), is one component of a comprehensive MRSA colonization surveillance program. It is not intended to diagnose MRSA infection nor to guide or monitor treatment for MRSA infections. Test performance is not FDA approved in patients less than 39 years old. Performed at [facility], [address] [place], [place] [address]      Labs: BNP (last 3 results) Recent Labs  04/24/23 1656 07/23/23 1700 01/13/24 2050  BNP 76.0 169.0* 148.0*   Basic Metabolic Panel: Recent Labs  Lab 01/13/24 2050 01/14/24 0521 01/15/24 0351  NA 135 136 136  K 3.4* 3.4* 3.7  CL 102 104 103  CO2 23 24 26   GLUCOSE 141* 111* 106*  BUN 26* 20 15  CREATININE 1.04* 0.90 0.76  CALCIUM 9.3 9.1 9.3  MG  --  1.5* 1.7   Liver Function Tests: Recent Labs  Lab 01/13/24 2050  AST 21  ALT 19  ALKPHOS 59  BILITOT 0.3  PROT 6.2*  ALBUMIN 2.5*   No results for input(s): LIPASE, AMYLASE in the last 168 hours. No results for input(s): AMMONIA in the last 168 hours. CBC: Recent Labs  Lab 01/13/24 2050 01/14/24 0521 01/15/24 0351  WBC 6.4 5.5 5.5  NEUTROABS 3.9  --   --   HGB 9.0* 8.9* 9.2*  HCT 26.6* 27.0* 29.0*  MCV 88.7 87.4 89.2  PLT 252 229 228   Cardiac Enzymes: No results for input(s): CKTOTAL, CKMB, CKMBINDEX, TROPONINI in the last 168 hours. BNP: Invalid input(s): POCBNP CBG: No results for input(s): GLUCAP in the last 168  hours. D-Dimer No results for input(s): DDIMER in the last 72 hours. Hgb A1c No results for input(s): HGBA1C in the last 72 hours. Lipid Profile No results for input(s): CHOL, HDL, LDLCALC, TRIG, CHOLHDL, LDLDIRECT in the last 72 hours. Thyroid  function studies No results for input(s): TSH, T4TOTAL, T3FREE, THYROIDAB in the last 72 hours.  Invalid input(s): FREET3 Anemia work up No results for input(s): VITAMINB12, FOLATE, FERRITIN, TIBC, IRON, RETICCTPCT in the last 72 hours. Urinalysis    Component Value Date/Time   COLORURINE STRAW (A) 01/13/2024 2300   APPEARANCEUR CLEAR 01/13/2024 2300   LABSPEC 1.028 01/13/2024 2300   PHURINE 6.0 01/13/2024 2300   GLUCOSEU NEGATIVE 01/13/2024 2300   HGBUR NEGATIVE 01/13/2024 2300   BILIRUBINUR NEGATIVE 01/13/2024 2300   KETONESUR NEGATIVE 01/13/2024 2300   PROTEINUR NEGATIVE 01/13/2024 2300   UROBILINOGEN 0.2 08/14/2011 1753   NITRITE NEGATIVE 01/13/2024 2300   LEUKOCYTESUR TRACE (A) 01/13/2024 2300   Sepsis Labs Recent Labs  Lab 01/13/24 2050 01/14/24 0521 01/15/24 0351  WBC 6.4 5.5 5.5   Microbiology Recent Results (from the past 240 hours)  Wet prep, genital     Status: None   Collection Time: 01/13/24 11:40 PM   Specimen: PATH Cytology Cervicovaginal Ancillary Only  Result Value Ref Range Status   Yeast Wet Prep HPF POC NONE SEEN NONE SEEN Final   Trich, Wet Prep NONE SEEN NONE SEEN Final   Clue Cells Wet Prep HPF POC NONE SEEN NONE SEEN Final   WBC, Wet Prep HPF POC <10 <10 Final   Sperm NONE SEEN  Final    Comment: Performed at [facility], [address], [place], [place] [address]  MRSA Next Gen by PCR, Nasal     Status: None   Collection Time: 01/14/24  9:30 AM   Specimen: Nasal Mucosa; Nasal Swab  Result Value Ref Range Status   MRSA by PCR Next Gen NOT DETECTED NOT DETECTED Final    Comment: (NOTE) The GeneXpert MRSA Assay (FDA approved for NASAL specimens only), is one  component of a comprehensive MRSA colonization surveillance program. It is not intended to diagnose MRSA infection nor to guide or monitor treatment for MRSA infections. Test performance is not FDA approved in patients less than 63 years old. Performed at [facility], [address], [place],  [place] [address]      Time coordinating discharge: 35 minutes  SIGNED:   Cornelius Dill, DO [facility] 01/15/2024, 11:02 AM  If 7PM-7AM, please contact night-coverage [email or web address]

## 2024-01-15 NOTE — Progress Notes (Signed)
 Patient discharged to [facility] transported by EMS. Discharge summary placed in discharge packet to give to facility nurse. Attempted to call facility twice with no response to give report. Contacted patients son Mont Antis made him aware of patients discharge. Patients belongings sent with patient.

## 2024-01-15 NOTE — Plan of Care (Signed)

## 2024-01-15 NOTE — Progress Notes (Signed)
 Physical Therapy Treatment Patient Details Name: Marie [name] MRN: [id number] DOB: 1940/08/27 Today's Date: 01/15/2024   History of Present Illness Marie [name] is an 83 y.o. female with medical history significant for depression, anxiety, fibromyalgia, hypertension, IBS, recurrent UTIs, and CKD 3A who presents with pain in her left side, increased fatigue, cough, and exertional dyspnea.     Patient reports cough, exertional dyspnea, and worsening fatigue which has become severe.  She has also developed atraumatic pain around her left flank that is worse with certain movements or palpation.  She denies any leg swelling, chest pain, or vomiting.    PT Comments  Pt tolerated today's treatment session, well and was received sitting in recliner. Pt with 5/10 pain today. Today's session addressed progressing functional mobility with ambulation distance and reducing physical support during transfers. Pt noted with reduced physical support given throughout treatment session and able to progress gait distance to 48ft, with rest breaks. Pt tolerating progression and reduced assist given well. Pt would continue to benefit from skilled acute physical therapy services in order to progress toward POC goals, safety/independence with functional mobility and QOL.     If plan is discharge home, recommend the following: A lot of help with walking and/or transfers;A lot of help with bathing/dressing/bathroom;Help with stairs or ramp for entrance;Assistance with cooking/housework   Can travel by private vehicle     Yes  Equipment Recommendations  None recommended by PT    Recommendations for Other Services       Precautions / Restrictions Precautions Precautions: Fall Recall of Precautions/Restrictions: Intact Restrictions Weight Bearing Restrictions Per Provider Order: No     Mobility  Bed Mobility               General bed mobility comments: resting in recliner at start of session Patient  Response: Cooperative  Transfers Overall transfer level: Needs assistance Equipment used: Rolling walker (2 wheels) Transfers: Sit to/from Stand Sit to Stand: Min assist, Contact guard assist           General transfer comment: Multiple sit/stand transfers performed from relciner Min assist initially with progression to CGA with verbal cues for proper movement patterns to RW    Ambulation/Gait Ambulation/Gait assistance: Contact guard assist Gait Distance (Feet): 24 Feet Assistive device: Rolling walker (2 wheels) Gait Pattern/deviations: Decreased step length - left, Decreased stride length, Decreased step length - right       General Gait Details: slow labored movement without loss of balance, with antalgic gait pattern. Pt performing 2 bouts of 29ft and 58ft with RW at [facility] for balancing and verbal cues for negotiationg surrounding. Pt with increased SOB during ambulation and noticable forward trunk flexion due to LB pain.   Stairs             Wheelchair Mobility     Tilt Bed Tilt Bed Patient Response: Cooperative  Modified Rankin (Stroke Patients Only)       Balance Overall balance assessment: Needs assistance Sitting-balance support: Feet supported Sitting balance-Leahy Scale: Normal Sitting balance - Comments: Normal in recliner   Standing balance support: Bilateral upper extremity supported, During functional activity, Reliant on assistive device for balance Standing balance-Leahy Scale: Fair Standing balance comment: fair<> good using RW                            Communication Communication Communication: No apparent difficulties  Cognition Arousal: Alert Behavior During Therapy: WFL for tasks assessed/performed  PT - Cognitive impairments: No apparent impairments                         Following commands: Intact      Cueing Cueing Techniques: Verbal cues, Tactile cues  Exercises General Exercises - Lower  Extremity Long Arc Quad: Both, Strengthening, 10 reps, Seated Toe Raises: Both, Strengthening, 20 reps, Seated Heel Raises: Both, Strengthening, 20 reps, Seated    General Comments        Pertinent Vitals/Pain Pain Assessment Pain Assessment: 0-10 Pain Score: 5  Pain Location: L flank and back Pain Descriptors / Indicators: Aching, Dull Pain Intervention(s): Monitored during session    Home Living                          Prior Function            PT Goals (current goals can now be found in the care plan section) Acute Rehab PT Goals Patient Stated Goal: return home with family to assist PT Goal Formulation: With patient Time For Goal Achievement: 01/28/24 Potential to Achieve Goals: Good    Frequency    Min 3X/week      PT Plan      Co-evaluation              AM-PAC PT 6 Clicks Mobility   Outcome Measure  Help needed turning from your back to your side while in a flat bed without using bedrails?: A Little Help needed moving from lying on your back to sitting on the side of a flat bed without using bedrails?: A Little Help needed moving to and from a bed to a chair (including a wheelchair)?: A Little Help needed standing up from a chair using your arms (e.g., wheelchair or bedside chair)?: A Little Help needed to walk in hospital room?: A Lot Help needed climbing 3-5 steps with a railing? : A Lot 6 Click Score: 16    End of Session   Activity Tolerance: Patient tolerated treatment well;Patient limited by fatigue Patient left: in chair;with call bell/phone within reach Nurse Communication: Mobility status PT Visit Diagnosis: Unsteadiness on feet (R26.81);Other abnormalities of gait and mobility (R26.89);Muscle weakness (generalized) (M62.81)     Time: 6045-4098 PT Time Calculation (min) (ACUTE ONLY): 23 min  Charges:    $Therapeutic Activity: 23-37 mins PT General Charges $$ ACUTE PT VISIT: 1 Visit                     Astrid Lay, DPT [facility] [address] [phone] office   Gatha Kaska 01/15/2024, 9:29 AM

## 2024-01-15 NOTE — TOC Transition Note (Signed)
 Transition of Care [facility]) - Discharge Note   Patient Details  Name: Marie [name] MRN: [id number] Date of Birth: [birth year]  [facility] CM/SW Contact:  Orelia Binet, RN Phone Number: 01/15/2024, 11:08 AM   Clinical Narrative:   Patient is medically ready to discharge to [facility]. Auth Received. Margretta Shi provided room 506-A, RN calling report, TOC will schedule EMS when RN is ready. Med necessity printed and clinicals sent in the hub.  CM spoke with her son Mont Antis to update him with discharge plan.     Final next level of care: Skilled Nursing Facility Barriers to Discharge: Barriers Resolved   Patient Goals and CMS Choice Patient states their goals for this hospitalization and ongoing recovery are:: agreeeable to SNF CMS Medicare.gov Compare Post Acute Care list provided to:: Patient Choice offered to / list presented to : Patient [place] ownership interest in [facility]provided to:: Patient    Discharge Placement                Patient to be transferred to facility by: EMs Name of family member notified: Mont Antis Patient and family notified of of transfer: 01/15/24  Discharge Plan and Services Additional resources added to the After Visit Summary for                                       Social Drivers of Health (SDOH) Interventions SDOH Screenings   Food Insecurity: No Food Insecurity (01/14/2024)  Housing: Low Risk  (01/14/2024)  Transportation Needs: No Transportation Needs (01/14/2024)  Utilities: Not At Risk (01/14/2024)  Social Connections: Moderately Integrated (01/14/2024)  Tobacco Use: Low Risk  (01/14/2024)     Readmission Risk Interventions    05/07/2023   10:24 AM 05/03/2023   11:39 AM  Readmission Risk Prevention Plan  Post Dischage Appt Complete   Medication Screening Complete   Transportation Screening Complete Complete  PCP or Specialist Appt within 5-7 Days  Not Complete  Home Care Screening  Complete   Medication Review (RN CM)  Complete

## 2024-01-16 LAB — LEGIONELLA PNEUMOPHILA SEROGP 1 UR AG: L. pneumophila Serogp 1 Ur Ag: NEGATIVE

## 2024-01-31 ENCOUNTER — Ambulatory Visit (INDEPENDENT_AMBULATORY_CARE_PROVIDER_SITE_OTHER): Admitting: Internal Medicine

## 2024-01-31 ENCOUNTER — Encounter: Payer: Self-pay | Admitting: *Deleted

## 2024-01-31 VITALS — BP 113/68 | HR 79 | Temp 98.7°F | Ht 63.0 in | Wt 126.4 lb

## 2024-01-31 DIAGNOSIS — K219 Gastro-esophageal reflux disease without esophagitis: Secondary | ICD-10-CM

## 2024-01-31 DIAGNOSIS — K5641 Fecal impaction: Secondary | ICD-10-CM | POA: Diagnosis not present

## 2024-01-31 DIAGNOSIS — K52832 Lymphocytic colitis: Secondary | ICD-10-CM

## 2024-01-31 DIAGNOSIS — R6881 Early satiety: Secondary | ICD-10-CM

## 2024-01-31 DIAGNOSIS — K439 Ventral hernia without obstruction or gangrene: Secondary | ICD-10-CM | POA: Diagnosis not present

## 2024-01-31 NOTE — Patient Instructions (Signed)
 It was nice to see you today  You have a large fecal impaction.  Stool is backed up in your rectum.  Your colon needs to be cleaned out.  Will provide a colonoscopy prep for you to take this afternoon  Hopefully, as your colon empties out you will feel much better.  Avoid Imodium for now  We will check on you first thing June 30 and see how you are doing.  Depending on your progress report, we will make further recommendations.  You do have a right sided abdominal hernia on CT which is chronic finding.  Does not seem to be causing any problem at this time  Further recommendations to follow.

## 2024-01-31 NOTE — Progress Notes (Unsigned)
 Primary Care Physician:  [facility] Primary Gastroenterologist:  Dr. Shaaron  Pre-Procedure History & Physical: HPI:  Marie [name] is a 83 y.o. female here for follow-up of GERD, hospital follow-up.  History of chronic constipation I saw this lady back in May for reflux and constipation.  Her regimen at that time include Linzess  72 daily and MiraLAX  at bedtime bumped her Nexium  to twice daily for better control.  At that time she was doing well.  Unfortunately, she developed pneumonia and was admitted to the hospital and was discharged earlier this month went to the SNF for short period of time since that time she has done poorly apparently on Linzess  and MiraLAX  having episodes of diarrhea and left-sided abdominal pain feels full not eating that much indeed, she is lost 9 pounds since her May visit here.  Son gives her Imodium occasionally for diarrhea.  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  Did have some debris in her esophagus on scan which is nonspecific.  She denies dysphagia or vomiting.  She does describe symptoms of early satiety.  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 Past Medical History:  Diagnosis Date   Anemia    Collagenous colitis    colonoscopy 2009   Depression    Fibromyalgia    GERD (gastroesophageal reflux disease)    Hiatal hernia 2008   small   Hypercholesterolemia    Hypertension    IBS (irritable bowel syndrome)    Lymphocytic colitis    colonoscopy 2007, TCS 2013   S/P endoscopy Sept 2008   small hh, s/p 86 French dilator    Past Surgical History:  Procedure Laterality Date   ABDOMINAL HYSTERECTOMY     BIOPSY  01/09/2017   Procedure: BIOPSY;  Surgeon: Shaaron Lamar HERO, MD;  Location: [facility]  ENDO SUITE;  Service: Endoscopy;;  colon   CATARACT EXTRACTION Bilateral 2021   COLONOSCOPY  04/13/2008   Dr.Dianna Ewald- anal papilla and internal hemorrhage otherwise normal /left side diverticula   COLONOSCOPY  09/20/2011   Dr.Edlin Ford- Adequate preparation. Internal hemorrhoids; otherwise normal/ Left-sided diverticula, biopsies positive for lymphocytic colitis   COLONOSCOPY N/A 01/09/2017   Procedure: COLONOSCOPY;  Surgeon: Shaaron Lamar HERO, MD;  Location: [facility] ENDO SUITE;  Service: Endoscopy;  Laterality: N/A;  7:30am   ESOPHAGOGASTRODUODENOSCOPY  04/22/2007   Dr.Micheale Schlack- normal esophagus, small hiatal hernia o/w normal stomach, D1 and D2 s/p passage of a 56-French maloney dilator   fistula of colon     FLEXIBLE SIGMOIDOSCOPY  05/29/2006   Dr.Rehman- No evidence of pseudomembranous or acute colitis.   FOOT SURGERY     KNEE SURGERY     NOSE SURGERY     VEIN SURGERY Bilateral 2021    Prior to Admission medications   Medication Sig Start Date End Date Taking? Authorizing Provider  acetaminophen  (TYLENOL ) 500 MG tablet Take 2 tablets (1,000 mg total) by mouth every 8 (eight) hours as needed for mild pain (pain score 1-3), fever or headache. 07/25/23  Yes Ricky Fines, MD  Ascorbic Acid (VITAMIN C PO) Take 1 tablet by mouth every morning.   Yes [provider]  CALCIUM PO Take 1 tablet by mouth every morning.   Yes [provider]  citalopram  (CELEXA ) 40 MG tablet Take 40  mg by mouth daily. 12/02/23  Yes [provider]  clonazePAM  (KLONOPIN ) 1 MG tablet Take 1 tablet (1 mg total) by mouth 2 (two) times daily as needed for anxiety. 01/15/24  Yes Shah, Pratik D, DO  diclofenac  Sodium (VOLTAREN ) 1 % GEL Apply 4 g topically 4 (four) times daily as needed (joint and musculoskeletal pain). Apply to affected areas 05/07/23  Yes Johnson, Clanford L, MD  esomeprazole  (NEXIUM ) 40 MG capsule Take 1 capsule (40 mg total) by mouth 2 (two) times daily before a meal. 12/25/23  Yes Annaliz Aven, Lamar HERO,  MD  hyoscyamine  (LEVSIN ) 0.125 MG tablet Take 0.125 mg by mouth every 8 (eight) hours as needed for cramping. 05/22/23  Yes [provider]  linaclotide  (LINZESS ) 72 MCG capsule Take 1 capsule (72 mcg total) by mouth daily before breakfast. 12/25/23  Yes Oluwatoni Rotunno, Lamar HERO, MD  loratadine  (CLARITIN ) 10 MG tablet Take 1 tablet (10 mg total) by mouth daily. 07/26/23  Yes Ricky Fines, MD  Multiple Vitamins-Minerals (CENTRUM SILVER ULTRA WOMENS PO) Take 1 tablet by mouth every morning.   Yes [provider]  Omega-3 Fatty Acids (FISH OIL) 1000 MG CAPS Take 2 capsules by mouth daily.   Yes [provider]  traMADol  (ULTRAM ) 50 MG tablet Take 1 tablet (50 mg total) by mouth every 12 (twelve) hours as needed for severe pain (pain score 7-10). 01/15/24  Yes Maree, Pratik D, DO  vitamin B-12 (CYANOCOBALAMIN ) 1000 MCG tablet Take 1,000 mcg by mouth daily.   Yes [provider]  hydrochlorothiazide  (HYDRODIURIL ) 12.5 MG tablet Take 12.5 mg by mouth daily. Patient not taking: Reported on 01/31/2024 10/14/23   [provider]  losartan  (COZAAR ) 50 MG tablet Take 50 mg by mouth daily. Patient not taking: Reported on 01/31/2024    [provider]    Allergies as of 01/31/2024 - Review Complete 01/31/2024  Allergen Reaction Noted   Codeine Hives and Swelling 12/27/2015   Promethazine hcl Swelling 10/09/2022    Family History  Problem Relation Age of Onset   Colon cancer Maternal Uncle    Colon cancer Maternal Grandmother     Social History   Socioeconomic History   Marital status: Widowed    Spouse name: Not on file   Number of children: Not on file   Years of education: Not on file   Highest education level: Not on file  Occupational History   Not on file  Tobacco Use   Smoking status: Never   Smokeless tobacco: Never  Vaping Use   Vaping status: Never Used  Substance and Sexual Activity   Alcohol use: No   Drug use: No   Sexual activity:  Not Currently  Other Topics Concern   Not on file  Social History Narrative   Not on file   Social Drivers of Health   Financial Resource Strain: Not on file  Food Insecurity: No Food Insecurity (01/14/2024)   Hunger Vital Sign    Worried About Running Out of Food in the Last Year: Never true    Ran Out of Food in the Last Year: Never true  Transportation Needs: No Transportation Needs (01/14/2024)   PRAPARE - Administrator, Civil Service (Medical): No    Lack of Transportation (Non-Medical): No  Physical Activity: Not on file  Stress: Not on file  Social Connections: Moderately Integrated (01/14/2024)   Social Connection and Isolation Panel    Frequency of Communication with Friends and Family: More than three  times a week    Frequency of Social Gatherings with Friends and Family: More than three times a week    Attends Religious Services: More than 4 times per year    Active Member of Clubs or Organizations: Yes    Attends Banker Meetings: More than 4 times per year    Marital Status: Widowed  Intimate Partner Violence: Not At Risk (01/14/2024)   Humiliation, Afraid, Rape, and Kick questionnaire    Fear of Current or Ex-Partner: No    Emotionally Abused: No    Physically Abused: No    Sexually Abused: No    Review of Systems: See HPI, otherwise negative ROS  Physical Exam: BP 113/68   Pulse 79   Temp 98.7 F (37.1 C)   Ht 5' 3 (1.6 m)   Wt 126 lb 6.4 oz (57.3 kg)   BMI 22.39 kg/m  General:   Alert, frail mildly distressed lady in her wheelchair.  Accompanied by her son.  Skin:  Intact without significant lesions or rashes. EyesNeck:  Supple; no masses or thyromegaly. No significant cervical adenopathy. Lungs:  Clear throughout to auscultation.   No wheezes, crackles, or rhonchi. No acute distress. Heart:  Regular rate and rhythm; no murmurs, clicks, rubs,  or gallops. Abdomen: Nondistended positive bowel sounds she does have some vague  left-sided abdominal pain.   Rectal: Large volume soft stool impaction.   Impression/Plan: This 83 year old lady with a history of microscopic colitis in remission.  Frankly constipated recently.  Recently hospitalized for pneumonia.  Sent to SNF.  She has recently developed symptoms of fullness some early satiety vague left-sided abdominal pain.  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  Reflux symptoms are stable  She has a fecal impaction.  Likely having spurious diarrhea.  She needs a colonic purge.  Recommendations:  Will provide a colonoscopy prep this afternoon with Clenpiq  Avoid Imodium for now  Will follow-up with you in 72 hours and assess  progress.  She is clinically improved we will increase the dose of Linzess .  If not, further evaluation would be warranted.  Further recommendations to follow.   Notice: This dictation was prepared with Dragon dictation along with smaller phrase technology. Any transcriptional errors that result from this process are unintentional and may not be corrected upon review.

## 2024-02-05 ENCOUNTER — Telehealth: Payer: Self-pay

## 2024-02-05 ENCOUNTER — Other Ambulatory Visit: Payer: Self-pay

## 2024-02-05 MED ORDER — LINACLOTIDE 290 MCG PO CAPS: 290.0000 ug | ORAL_CAPSULE | Freq: Every day | ORAL | 11 refills | Status: AC

## 2024-02-05 NOTE — Telephone Encounter (Signed)
 Pt called stating that she took the bowel prep and her bowels moved good. States that yesterday she still had diarrhea and that she went two times and they were big explosive episodes. Today she has had a normal bm of a good amount but feels like she needs to go again but can't . Pt states that she is having some abdominal pressure all the time. Pt is wanting to know what you suggest.

## 2024-02-05 NOTE — Telephone Encounter (Signed)
 Pt is unable to afford either rx due to coinsurance. Both are over $200 a month. The cheapest option with pt's insurance is lactulose.

## 2024-02-06 NOTE — Telephone Encounter (Signed)
 Pt was made aware and verbalized understanding.

## 2024-03-05 ENCOUNTER — Encounter: Payer: Self-pay | Admitting: Internal Medicine
